# Patient Record
Sex: Female | Born: 1970
Health system: Southern US, Community
[De-identification: ages and names within clinical notes are randomized; demographics above are authoritative.]

## PROBLEM LIST (undated history)

## (undated) DIAGNOSIS — I10 Essential (primary) hypertension: Secondary | ICD-10-CM

## (undated) DIAGNOSIS — G4700 Insomnia, unspecified: Secondary | ICD-10-CM

## (undated) DIAGNOSIS — J40 Bronchitis, not specified as acute or chronic: Secondary | ICD-10-CM

## (undated) DIAGNOSIS — J45909 Unspecified asthma, uncomplicated: Secondary | ICD-10-CM

## (undated) DIAGNOSIS — J449 Chronic obstructive pulmonary disease, unspecified: Secondary | ICD-10-CM

## (undated) DIAGNOSIS — E559 Vitamin D deficiency, unspecified: Secondary | ICD-10-CM

## (undated) HISTORY — PX: TUBAL LIGATION: SHX77

## (undated) HISTORY — PX: TONSILLECTOMY: SUR1361

## (undated) HISTORY — DX: Insomnia, unspecified: G47.00

## (undated) HISTORY — DX: Chronic obstructive pulmonary disease, unspecified: J44.9

---
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
## 2000-08-16 ENCOUNTER — Encounter: Payer: Self-pay | Admitting: *Deleted

## 2000-08-16 ENCOUNTER — Emergency Department (HOSPITAL_COMMUNITY): Admission: EM | Admit: 2000-08-16 | Discharge: 2000-08-16 | Payer: Self-pay | Admitting: *Deleted

## 2000-10-13 ENCOUNTER — Emergency Department (HOSPITAL_COMMUNITY): Admission: EM | Admit: 2000-10-13 | Discharge: 2000-10-13 | Payer: Self-pay | Admitting: *Deleted

## 2001-06-16 ENCOUNTER — Emergency Department (HOSPITAL_COMMUNITY): Admission: EM | Admit: 2001-06-16 | Discharge: 2001-06-16 | Payer: Self-pay | Admitting: Internal Medicine

## 2001-06-17 ENCOUNTER — Emergency Department (HOSPITAL_COMMUNITY): Admission: EM | Admit: 2001-06-17 | Discharge: 2001-06-17 | Payer: Self-pay | Admitting: *Deleted

## 2002-01-15 ENCOUNTER — Emergency Department (HOSPITAL_COMMUNITY): Admission: EM | Admit: 2002-01-15 | Discharge: 2002-01-16 | Payer: Self-pay | Admitting: *Deleted

## 2002-01-19 ENCOUNTER — Emergency Department (HOSPITAL_COMMUNITY): Admission: EM | Admit: 2002-01-19 | Discharge: 2002-01-19 | Payer: Self-pay | Admitting: Emergency Medicine

## 2013-09-16 ENCOUNTER — Emergency Department: Payer: Self-pay | Admitting: Emergency Medicine

## 2013-09-16 LAB — COMPREHENSIVE METABOLIC PANEL
Albumin: 3.6 g/dL (ref 3.4–5.0)
Alkaline Phosphatase: 91 U/L
Anion Gap: 7 (ref 7–16)
BUN: 12 mg/dL (ref 7–18)
Bilirubin,Total: 0.4 mg/dL (ref 0.2–1.0)
Calcium, Total: 8.6 mg/dL (ref 8.5–10.1)
Chloride: 107 mmol/L (ref 98–107)
Co2: 28 mmol/L (ref 21–32)
Creatinine: 0.99 mg/dL (ref 0.60–1.30)
EGFR (African American): >60
EGFR (Non-African Amer.): >60
Glucose: 87 mg/dL (ref 65–99)
Osmolality: 282 (ref 275–301)
Potassium: 3.5 mmol/L (ref 3.5–5.1)
SGOT(AST): 21 U/L (ref 15–37)
SGPT (ALT): 19 U/L
Sodium: 142 mmol/L (ref 136–145)
Total Protein: 7.7 g/dL (ref 6.4–8.2)

## 2013-09-16 LAB — CBC
HCT: 43.9 % (ref 35.0–47.0)
HGB: 14.1 g/dL (ref 12.0–16.0)
MCH: 28.6 pg (ref 26.0–34.0)
MCHC: 32 g/dL (ref 32.0–36.0)
MCV: 89 fL (ref 80–100)
Platelet: 277 10*3/uL (ref 150–440)
RBC: 4.92 10*6/uL (ref 3.80–5.20)
RDW: 13.4 % (ref 11.5–14.5)
WBC: 3.6 10*3/uL (ref 3.6–11.0)

## 2013-09-16 LAB — TROPONIN I: Troponin-I: <0.02 ng/mL

## 2013-12-22 ENCOUNTER — Emergency Department: Payer: Self-pay | Admitting: Emergency Medicine

## 2014-07-07 ENCOUNTER — Emergency Department: Payer: Self-pay

## 2014-07-07 ENCOUNTER — Encounter: Payer: Self-pay | Admitting: Emergency Medicine

## 2014-07-07 ENCOUNTER — Emergency Department
Admission: EM | Admit: 2014-07-07 | Discharge: 2014-07-07 | Disposition: A | Discharge status: Home / Self Care | Payer: Self-pay | Attending: Emergency Medicine | Admitting: Emergency Medicine

## 2014-07-07 DIAGNOSIS — Z79899 Other long term (current) drug therapy: Secondary | ICD-10-CM | POA: Insufficient documentation

## 2014-07-07 DIAGNOSIS — S90212A Contusion of left great toe with damage to nail, initial encounter: Secondary | ICD-10-CM | POA: Insufficient documentation

## 2014-07-07 DIAGNOSIS — I10 Essential (primary) hypertension: Secondary | ICD-10-CM | POA: Insufficient documentation

## 2014-07-07 DIAGNOSIS — S99922A Unspecified injury of left foot, initial encounter: Secondary | ICD-10-CM

## 2014-07-07 DIAGNOSIS — W2209XA Striking against other stationary object, initial encounter: Secondary | ICD-10-CM | POA: Insufficient documentation

## 2014-07-07 DIAGNOSIS — S90122A Contusion of left lesser toe(s) without damage to nail, initial encounter: Secondary | ICD-10-CM

## 2014-07-07 DIAGNOSIS — Y9289 Other specified places as the place of occurrence of the external cause: Secondary | ICD-10-CM | POA: Insufficient documentation

## 2014-07-07 DIAGNOSIS — Y9389 Activity, other specified: Secondary | ICD-10-CM | POA: Insufficient documentation

## 2014-07-07 DIAGNOSIS — Y998 Other external cause status: Secondary | ICD-10-CM | POA: Insufficient documentation

## 2014-07-07 DIAGNOSIS — S91202A Unspecified open wound of left great toe with damage to nail, initial encounter: Secondary | ICD-10-CM | POA: Insufficient documentation

## 2014-07-07 DIAGNOSIS — S91209A Unspecified open wound of unspecified toe(s) with damage to nail, initial encounter: Secondary | ICD-10-CM

## 2014-07-07 HISTORY — DX: Essential (primary) hypertension: I10

## 2014-07-07 HISTORY — DX: Unspecified asthma, uncomplicated: J45.909

## 2014-07-07 MED ORDER — IBUPROFEN 800 MG PO TABS: 800.0000 mg | ORAL_TABLET | Freq: Three times a day (TID) | ORAL | Status: DC | PRN

## 2014-07-07 MED ORDER — TETANUS-DIPHTH-ACELL PERTUSSIS 5-2.5-18.5 LF-MCG/0.5 IM SUSP
0.5000 mL | Freq: Once | INTRAMUSCULAR | Status: AC
  Administered 2014-07-07: 0.5 mL via INTRAMUSCULAR

## 2014-07-07 MED ORDER — TRAMADOL HCL 50 MG PO TABS
ORAL_TABLET | ORAL | Status: AC
  Filled 2014-07-07: qty 1

## 2014-07-07 MED ORDER — TRAMADOL HCL 50 MG PO TABS: 50.0000 mg | ORAL_TABLET | Freq: Three times a day (TID) | ORAL | Status: DC | PRN

## 2014-07-07 MED ORDER — TETANUS-DIPHTH-ACELL PERTUSSIS 5-2.5-18.5 LF-MCG/0.5 IM SUSP
INTRAMUSCULAR | Status: AC
  Administered 2014-07-07: 0.5 mL via INTRAMUSCULAR
  Filled 2014-07-07: qty 0.5

## 2014-07-07 MED ORDER — TRAMADOL HCL 50 MG PO TABS
50.0000 mg | ORAL_TABLET | Freq: Once | ORAL | Status: AC
  Administered 2014-07-07: 50 mg via ORAL

## 2014-07-07 MED ORDER — CEPHALEXIN 500 MG PO CAPS: 500.0000 mg | ORAL_CAPSULE | Freq: Four times a day (QID) | ORAL | Status: AC

## 2014-07-07 NOTE — ED Provider Notes (Signed)
[facility] Emergency Department Provider Note  ____________________________________________  Time seen: Approximately 5:24 PM  I have reviewed the triage vital signs and the nursing notes.   HISTORY  Chief Complaint Laceration   HPI Catherine Kane is a 44 y.o. female presents to the ER for the complaints of left great toe pain. Patient states that prior to arrival she and spouse were moving a piece of furniture and neck furniture hit toe now causing toenail to be pulled up. Patient states pain since. Denies fall or other injury. Denies head injury or loss of consciousness.  States pain to be 7 out of 10 and throbbing. Denies pain radiation. States worse when touched.  Past Medical History  Diagnosis Date  . Asthma   . Hypertension     There are no active problems to display for this patient.   Past Surgical History  Procedure Laterality Date  . Tonsillectomy      Current Outpatient Rx  Name  Route  Sig  Dispense  Refill  . gemfibrozil (LOPID) 600 MG tablet   Oral   Take 600 mg by mouth 2 (two) times daily before a meal.         . lisinopril (PRINIVIL,ZESTRIL) 20 MG tablet   Oral   Take 20 mg by mouth daily.           Allergies Review of patient's allergies indicates no known allergies.  History reviewed. No pertinent family history.  Social History History  Substance Use Topics  . Smoking status: Never Smoker   . Smokeless tobacco: Not on file  . Alcohol Use: No    Review of Systems Constitutional: No fever/chills Eyes: No visual changes. ENT: No sore throat. Cardiovascular: Denies chest pain. Respiratory: Denies shortness of breath. Gastrointestinal: No abdominal pain.  No nausea, no vomiting.  No diarrhea.  No constipation. Genitourinary: Negative for dysuria. Musculoskeletal: Negative for back pain. Positive for left great toe and foot pain. Skin: Negative for rash. Neurological: Negative for headaches, focal  weakness or numbness.  10-point ROS otherwise negative.  ____________________________________________   PHYSICAL EXAM:  VITAL SIGNS: ED Triage Vitals  Enc Vitals Group     BP 07/07/14 1648 156/98 mmHg     Pulse Rate 07/07/14 1648 69     Resp 07/07/14 1648 21     Temp 07/07/14 1654 98.5 F (36.9 C)     Temp Source 07/07/14 1654 Oral     SpO2 07/07/14 1648 99 %     Weight --      Height --      Head Cir --      Peak Flow --      Pain Score 07/07/14 1641 8     Pain Loc --      Pain Edu? --      Excl. in GC? --     Constitutional: Alert and oriented. Well appearing and in no acute distress. Eyes: Conjunctivae are normal.  Head: Atraumatic. Nose: No congestion/rhinnorhea. Mouth/Throat: Mucous membranes are moist.  Neck: No stridor.   Hematological/Lymphatic/Immunilogical: No cervical lymphadenopathy. Cardiovascular: Normal rate, regular rhythm. Grossly normal heart sounds.  Good peripheral circulation. Respiratory: Normal respiratory effort.  No retractions. Lungs CTAB. Gastrointestinal: Soft and nontender.  Musculoskeletal: No lower extremity tenderness nor edema.  No joint effusions. Left foot tender along the first and second metatarsal and into first and second toe. Left great toe moderate tender to palpation with mild swelling. Left great toenail slightly avulsed upwards with right  lateral nail base pulled from bed, otherwise Nail base intact. No nailbed laceration. Sensation intact.  Neurologic:  Normal speech and language. No gross focal neurologic deficits are appreciated. Speech is normal. No gait instability. Skin:  Skin is warm, dry and intact. No rash noted. Psychiatric: Mood and affect are normal. Speech and behavior are normal.  _______________________________________  RADIOLOGY  LEFT FOOT - COMPLETE 3+ VIEW  COMPARISON: None  FINDINGS: Osseous mineralization normal.  Joint spaces preserved.  No fracture, dislocation, or bone  destruction.  IMPRESSION: No acute osseous abnormalities.   Electronically Signed By: Ulyses SouthwardMark Boles M.D. On: 07/07/2014 17:44 ____________________________________________   PROCEDURES  Procedure(s) performed:  Discussed procedure with patient. Patient verbalized understanding. Patient states does not want numbing/anesthesia at this time.Left foot soaked and cleaned with betadine and saline. Nail bed inspected. No nailbed laceration. Right lateral base of nail avulsed from bed. Nail again cleaned with saline and Betadine. Nail solid and intact. Corner of nail base reinserted into nailbed. Patient tolerated this well. Nail and surrounding tissue cleaned. Patient tolerated well.  ____________________________________________   INITIAL IMPRESSION / ASSESSMENT AND PLAN / ED COURSE  Pertinent labs & imaging results that were available during my care of the patient were reviewed by me and considered in my medical decision making (see chart for details).  Very well-appearing patient. At no acute distress. Presents to the ER with left great toe nail avulsion and pain after injury. X-ray negative for bony injury. Nail cleaned and repositioned into correct alignment. Recent tolerated well. Keep elevated and rest. Discussed strict follow-up and return parameters. Patient agreed to plan. ____________________________________________   FINAL CLINICAL IMPRESSION(S) / ED DIAGNOSES  Final diagnoses:  Injury of toenail, left great toe, initial encounter  Nail avulsion of left great toe, initial encounter  Contusion, left great toe, left, initial encounter      Renford DillsLindsey Jasyah Theurer, NP 07/07/14 1859  Governor Rooksebecca Lord, MD 07/07/14 2014

## 2014-07-07 NOTE — ED Notes (Signed)
Left great toe laceration.  Was moving furniture and hit great toe.  Toe nail peeled back.

## 2014-07-07 NOTE — ED Notes (Signed)
Capillary refill less than 3 seconds, pt tolerated procedure well, pain 3/10, and pulses present.

## 2014-07-07 NOTE — Discharge Instructions (Signed)
Keep area clean. Avoid reinjury. Take medication as prescribed.  Follow-up with her primary care physician or the above as needed this week  Return to the ER for new or worsening concerns.  Nail Avulsion Injury Nail avulsion means that you have lost the whole, or part of a nail. The nail will usually grow back in 2 to 6 months. If your injury damaged the growth center of the nail, the nail may be deformed, split, or not stuck to the nail bed. Sometimes the avulsed nail is stitched back in place. This provides temporary protection to the nail bed until the new nail grows in.  HOME CARE INSTRUCTIONS   Raise (elevate) your injury as much as possible.  Protect the injury and cover it with bandages (dressings) or splints as instructed.  Change dressings as instructed. SEEK MEDICAL CARE IF:   There is increasing pain, redness, or swelling.  You cannot move your fingers or toes. Document Released: 03/02/2004 Document Revised: 04/17/2011 Document Reviewed: 12/25/2008 [facility] Patient Information 2015 [place], [place]. This information is not intended to replace advice given to you by your health care provider. Make sure you discuss any questions you have with your health care provider.  Contusion A contusion is a deep bruise. Contusions happen when an injury causes bleeding under the skin. Signs of bruising include pain, puffiness (swelling), and discolored skin. The contusion may turn blue, purple, or yellow. HOME CARE   Put ice on the injured area.  Put ice in a plastic bag.  Place a towel between your skin and the bag.  Leave the ice on for 15-20 minutes, 03-04 times a day.  Only take medicine as told by your doctor.  Rest the injured area.  If possible, raise (elevate) the injured area to lessen puffiness. GET HELP RIGHT AWAY IF:   You have more bruising or puffiness.  You have pain that is getting worse.  Your puffiness or pain is not helped by medicine. MAKE SURE YOU:    Understand these instructions.  Will watch your condition.  Will get help right away if you are not doing well or get worse. Document Released: 07/12/2007 Document Revised: 04/17/2011 Document Reviewed: 11/28/2010 [facility] Patient Information 2015 [place], [place]. This information is not intended to replace advice given to you by your health care provider. Make sure you discuss any questions you have with your health care provider.

## 2016-01-10 ENCOUNTER — Emergency Department (HOSPITAL_COMMUNITY): Payer: Self-pay

## 2016-01-10 ENCOUNTER — Emergency Department (HOSPITAL_COMMUNITY)
Admission: EM | Admit: 2016-01-10 | Discharge: 2016-01-10 | Disposition: A | Discharge status: Home / Self Care | Payer: Self-pay | Attending: Emergency Medicine | Admitting: Emergency Medicine

## 2016-01-10 ENCOUNTER — Encounter (HOSPITAL_COMMUNITY): Payer: Self-pay | Admitting: Nurse Practitioner

## 2016-01-10 DIAGNOSIS — R0602 Shortness of breath: Secondary | ICD-10-CM | POA: Insufficient documentation

## 2016-01-10 DIAGNOSIS — J45909 Unspecified asthma, uncomplicated: Secondary | ICD-10-CM | POA: Insufficient documentation

## 2016-01-10 DIAGNOSIS — R002 Palpitations: Secondary | ICD-10-CM | POA: Insufficient documentation

## 2016-01-10 DIAGNOSIS — Z79899 Other long term (current) drug therapy: Secondary | ICD-10-CM | POA: Insufficient documentation

## 2016-01-10 DIAGNOSIS — R109 Unspecified abdominal pain: Secondary | ICD-10-CM | POA: Insufficient documentation

## 2016-01-10 DIAGNOSIS — I10 Essential (primary) hypertension: Secondary | ICD-10-CM | POA: Insufficient documentation

## 2016-01-10 HISTORY — DX: Bronchitis, not specified as acute or chronic: J40

## 2016-01-10 LAB — CBC
HEMATOCRIT: 43.7 % (ref 36.0–46.0)
Hemoglobin: 14.9 g/dL (ref 12.0–15.0)
MCH: 29.7 pg (ref 26.0–34.0)
MCHC: 34.1 g/dL (ref 30.0–36.0)
MCV: 87.2 fL (ref 78.0–100.0)
PLATELETS: 285 10*3/uL (ref 150–400)
RBC: 5.01 MIL/uL (ref 3.87–5.11)
RDW: 13.2 % (ref 11.5–15.5)
WBC: 5.3 10*3/uL (ref 4.0–10.5)

## 2016-01-10 LAB — BASIC METABOLIC PANEL
Anion gap: 8 (ref 5–15)
BUN: 9 mg/dL (ref 6–20)
CALCIUM: 9.4 mg/dL (ref 8.9–10.3)
CO2: 24 mmol/L (ref 22–32)
Chloride: 106 mmol/L (ref 101–111)
Creatinine, Ser: 0.79 mg/dL (ref 0.44–1.00)
GFR calc Af Amer: >60 mL/min (ref >60)
Glucose, Bld: 90 mg/dL (ref 65–99)
POTASSIUM: 3.6 mmol/L (ref 3.5–5.1)
SODIUM: 138 mmol/L (ref 135–145)

## 2016-01-10 LAB — MAGNESIUM: Magnesium: 1.9 mg/dL (ref 1.7–2.4)

## 2016-01-10 LAB — I-STAT BETA HCG BLOOD, ED (MC, WL, AP ONLY): I-stat hCG, quantitative: <5 m[IU]/mL (ref <5)

## 2016-01-10 LAB — URINALYSIS, ROUTINE W REFLEX MICROSCOPIC
Bilirubin Urine: NEGATIVE
Glucose, UA: NEGATIVE mg/dL
Hgb urine dipstick: NEGATIVE
KETONES UR: NEGATIVE mg/dL
Leukocytes, UA: NEGATIVE
NITRITE: NEGATIVE
PH: 7 (ref 5.0–8.0)
PROTEIN: NEGATIVE mg/dL
Specific Gravity, Urine: 1.024 (ref 1.005–1.030)

## 2016-01-10 LAB — I-STAT TROPONIN, ED: Troponin i, poc: 0 ng/mL (ref 0.00–0.08)

## 2016-01-10 LAB — BRAIN NATRIURETIC PEPTIDE: B Natriuretic Peptide: 19.3 pg/mL (ref 0.0–100.0)

## 2016-01-10 LAB — TSH: TSH: 0.54 u[IU]/mL (ref 0.350–4.500)

## 2016-01-10 MED ORDER — KETOROLAC TROMETHAMINE 60 MG/2ML IM SOLN
60.0000 mg | Freq: Once | INTRAMUSCULAR | Status: AC
  Administered 2016-01-10: 60 mg via INTRAMUSCULAR
  Filled 2016-01-10: qty 2

## 2016-01-10 MED ORDER — NAPROXEN 500 MG PO TABS: 500.0000 mg | ORAL_TABLET | Freq: Two times a day (BID) | ORAL | 0 refills | Status: DC

## 2016-01-10 NOTE — ED Triage Notes (Signed)
Pt endorses chest fluttering and palpitations intermittently for 1-2 months but in the past week she has had these episodes every day. Patient states episodes last less than a minute and typically when she is laying down, but has happened when she was walking. Patient states left shoulder tightness during episode.Patient also notes she coughs after spells.   Patient also complaining of left sided flank pain that radiates to groin one week ago. Patient states has mild relief with ibuprofen. Denies injury, heavy lifting or twisting. Patient states has dysuria and polyuria started three days ago. Pt does take lasix.   Pt in NAD, ambulatory

## 2016-01-10 NOTE — ED Notes (Signed)
Patient transported to CT 

## 2016-01-10 NOTE — ED Notes (Signed)
Pt ambulatory at discharge. Pt verbalized understanding of discharge teaching. NAD. VSS.

## 2016-01-10 NOTE — ED Notes (Signed)
Patient transported to X-ray 

## 2016-01-10 NOTE — Discharge Instructions (Signed)
Please follow up with your primary care physician for possible Holter monitoring.  Start taking Naprosyn twice daily for your left flank pain.  Return to the ED for sudden worsening pain, fever, chills, chest pain, shortness of breath, or any new or concerning symptoms.

## 2016-01-10 NOTE — ED Provider Notes (Signed)
[facility]-EMERGENCY DEPT Provider Note   CSN: [id number] Arrival date & time: 01/10/16  1137     History   Chief Complaint Chief Complaint  Patient presents with  . Flank Pain  . Palpitations    HPI Catherine Kane is a 45 y.o. female.  HPI Catherine Kane is a 44 y.o. female with PMH significant for HTN, asthma, and bronchitis who presents with 2 complaints.  1. Palpitations.  Patient reports feeling like her heart is fluttering over the last 1-2 months, but it has increased in frequency over the last week and has occurred every day.  She states it lasts less than a minute.  Associated symptoms include left sided chest discomfort and SOB.  It comes with exertion and at rest, no specific trigger.  No syncope, fever, chills, confusion, diaphoresis, dizziness, lightheadedness or presyncope.  No cardiac history.  No lower extremity swelling.  She attributes her SOB due to her weight.  She states she has been under a lot of stress recently and wonders if this is anxiety.   2. Left flank pain.  Patient reports sudden onset, waxing and waning, throbbing left flank pain that began about a week ago.  Associated symptoms include dysuria and increased urinary frequency.  No injury/trauma.  Nothing makes it worse.  Ibuprofen seems to help.  She reports a history of left sided sciatica, but states this does not feel similar.  No numbness, weakness, b/b incontinence, urinary retention, hematuria.    Past Medical History:  Diagnosis Date  . Asthma   . Bronchitis   . Hypertension     There are no active problems to display for this patient.   Past Surgical History:  Procedure Laterality Date  . TONSILLECTOMY      OB History    No data available       Home Medications    Prior to Admission medications   Medication Sig Start Date End Date Taking? Authorizing Provider  amLODipine (NORVASC) 10 MG tablet Take 10 mg by mouth daily.   Yes Historical Provider, MD    hydrochlorothiazide (MICROZIDE) 12.5 MG capsule Take 12.5 mg by mouth daily.   Yes Historical Provider, MD  ibuprofen (ADVIL,MOTRIN) 800 MG tablet Take 1 tablet (800 mg total) by mouth every 8 (eight) hours as needed for mild pain or moderate pain. 07/07/14  Yes Renford Dills, NP  lisinopril (PRINIVIL,ZESTRIL) 20 MG tablet Take 20 mg by mouth daily.   Yes Historical Provider, MD  gemfibrozil (LOPID) 600 MG tablet Take 600 mg by mouth 2 (two) times daily before a meal.    Historical Provider, MD  naproxen (NAPROSYN) 500 MG tablet Take 1 tablet (500 mg total) by mouth 2 (two) times daily. 01/10/16   Cheri Fowler, PA-C  traMADol (ULTRAM) 50 MG tablet Take 1 tablet (50 mg total) by mouth every 8 (eight) hours as needed (Do not drive or operate machinery while taking as can cause drowsiness.). Patient not taking: Reported on 01/10/2016 07/07/14   Renford Dills, NP    Family History No family history on file.  Social History Social History  Substance Use Topics  . Smoking status: Never Smoker  . Smokeless tobacco: Never Used  . Alcohol use No     Allergies   Patient has no known allergies.   Review of Systems Review of Systems All other systems negative unless otherwise stated in HPI   Physical Exam Updated Vital Signs BP 147/91   Pulse 75   Temp 98 F (  36.7 C) (Oral)   Resp 16   Ht 5\' 3"  (1.6 m)   Wt 92.8 kg   LMP 12/27/2015   SpO2 100%   BMI 36.24 kg/m   Physical Exam  Constitutional: She is oriented to person, place, and time. She appears well-developed and well-nourished.  Non-toxic appearance. She does not have a sickly appearance. She does not appear ill.  HENT:  Head: Normocephalic and atraumatic.  Mouth/Throat: Oropharynx is clear and moist.  Eyes: Conjunctivae are normal.  Neck: Normal range of motion. Neck supple.  Cardiovascular: Normal rate and regular rhythm.   Pulmonary/Chest: Effort normal and breath sounds normal. No accessory muscle usage or stridor. No  respiratory distress. She has no wheezes. She has no rhonchi. She has no rales.  Abdominal: Soft. Bowel sounds are normal. She exhibits no distension. There is no tenderness. There is no rebound and no guarding.  No CVA tenderness.  Musculoskeletal: Normal range of motion. She exhibits tenderness.  No t/l midline tenderness. Mild tenderness over left lumbar and flank musculature.   Lymphadenopathy:    She has no cervical adenopathy.  Neurological: She is alert and oriented to person, place, and time.  Speech clear without dysarthria.  Skin: Skin is warm and dry.  Psychiatric: She has a normal mood and affect. Her behavior is normal.     ED Treatments / Results  Labs (all labs ordered are listed, but only abnormal results are displayed) Labs Reviewed  BASIC METABOLIC PANEL  CBC  URINALYSIS, ROUTINE W REFLEX MICROSCOPIC (NOT AT [facility])  TSH  MAGNESIUM  BRAIN NATRIURETIC PEPTIDE  I-STAT BETA HCG BLOOD, ED ([facility], [facility], [facility] ONLY)  I-STAT TROPOININ, ED    EKG  EKG Interpretation  Date/Time:  Monday January 10 2016 15:23:18 EST Ventricular Rate:  70 PR Interval:    QRS Duration: 105 QT Interval:  386 QTC Calculation: 417 R Axis:   33 Text Interpretation:  Sinus rhythm Inferior infarct, old Confirmed by GOLDSTON MD, SCOTT [phone]([id number]) on 01/10/2016 4:30:40 PM       Radiology Dg Chest 2 View  Result Date: 01/10/2016 CLINICAL DATA:  Chest pain and shortness of Breath EXAM: CHEST  2 VIEW COMPARISON:  09/16/2013 FINDINGS: The heart size and mediastinal contours are within normal limits. Both lungs are clear. The visualized skeletal structures are unremarkable. IMPRESSION: No active cardiopulmonary disease. Electronically Signed   By: Alcide CleverMark  Lukens M.D.   On: 01/10/2016 15:42   Ct Renal Stone Study  Result Date: 01/10/2016 CLINICAL DATA:  45 y/o  F; left flank pain. EXAM: CT ABDOMEN AND PELVIS WITHOUT CONTRAST TECHNIQUE: Multidetector CT imaging of the abdomen and pelvis was performed  following the standard protocol without IV contrast. COMPARISON:  None. FINDINGS: Lower chest: No acute abnormality. Hepatobiliary: Hepatic steatosis. No focal liver abnormality. Normal gallbladder. No intra or extrahepatic biliary ductal dilatation. Pancreas: Unremarkable. No pancreatic ductal dilatation or surrounding inflammatory changes. Spleen: Normal in size without focal abnormality. Adrenals/Urinary Tract: Adrenal glands are unremarkable. Kidneys are normal, without renal calculi, focal lesion, or hydronephrosis. Bladder is unremarkable. Stomach/Bowel: Stomach is within normal limits. Appendix appears normal. No evidence of bowel wall thickening, distention, or inflammatory changes. Vascular/Lymphatic: No significant vascular findings are present. No enlarged abdominal or pelvic lymph nodes. Reproductive: Uterus and bilateral adnexa are unremarkable. Other: Several scattered densities within the lower abdominal wall subcutaneous fat may represent injection sites or sequelae of trauma. Musculoskeletal: No acute or significant osseous findings. IMPRESSION: 1. No acute process identified. No urinary stone disease  or obstructive uropathy. 2. Hepatic steatosis. Electronically Signed   By: Mitzi HansenLance  Furusawa-Stratton M.D.   On: 01/10/2016 16:21    Procedures Procedures (including critical care time)  Medications Ordered in ED Medications  ketorolac (TORADOL) injection 60 mg (60 mg Intramuscular Given 01/10/16 1637)     Initial Impression / Assessment and Plan / ED Course  I have reviewed the triage vital signs and the nursing notes.  Pertinent labs & imaging results that were available during my care of the patient were reviewed by me and considered in my medical decision making (see chart for details).  Clinical Course    Patient presents with palpitations without red flags (cardiac disease, abnormal EKG, syncope/pre-syncope, exertional, CHF, or hypotension).  Labs including Mg and TSH without  acute abnormalities.  EKG without arrhythmias.  Discussed follow up with PCP for possible Holter monitoring.  Regarding flank pain, CT renal stone study (obtained due to acute colicky nature) without acute abnormalities, UA without infection.  Abdomen is soft and benign, low suspicion for acute abdominal or gynecologic etiology.  Possible musculoskeletal.  Plan to discharge home with Naproxen.  Return precautions discussed.  All questions answered.  Stable for discharge   Case has been discussed with Dr. Criss AlvineGoldston who agrees with the above plan for discharge.   Final Clinical Impressions(s) / ED Diagnoses   Final diagnoses:  Palpitations  Left flank pain    New Prescriptions New Prescriptions   NAPROXEN (NAPROSYN) 500 MG TABLET    Take 1 tablet (500 mg total) by mouth 2 (two) times daily.     Cheri FowlerKayla Jamesen Stahnke, PA-C 01/10/16 1649    Pricilla LovelessScott Goldston, MD 01/11/16 [phone]

## 2016-08-11 ENCOUNTER — Emergency Department (HOSPITAL_COMMUNITY): Payer: Self-pay

## 2016-08-11 ENCOUNTER — Emergency Department (HOSPITAL_COMMUNITY)
Admission: EM | Admit: 2016-08-11 | Discharge: 2016-08-11 | Disposition: A | Discharge status: Home / Self Care | Payer: Self-pay | Attending: Emergency Medicine | Admitting: Emergency Medicine

## 2016-08-11 ENCOUNTER — Encounter (HOSPITAL_COMMUNITY): Payer: Self-pay

## 2016-08-11 DIAGNOSIS — Z79899 Other long term (current) drug therapy: Secondary | ICD-10-CM | POA: Insufficient documentation

## 2016-08-11 DIAGNOSIS — I10 Essential (primary) hypertension: Secondary | ICD-10-CM | POA: Insufficient documentation

## 2016-08-11 DIAGNOSIS — J45909 Unspecified asthma, uncomplicated: Secondary | ICD-10-CM | POA: Insufficient documentation

## 2016-08-11 LAB — I-STAT BETA HCG BLOOD, ED (MC, WL, AP ONLY)

## 2016-08-11 LAB — BASIC METABOLIC PANEL
Anion gap: 8 (ref 5–15)
BUN: 11 mg/dL (ref 6–20)
CALCIUM: 9.2 mg/dL (ref 8.9–10.3)
CO2: 24 mmol/L (ref 22–32)
Chloride: 103 mmol/L (ref 101–111)
Creatinine, Ser: 0.72 mg/dL (ref 0.44–1.00)
GFR calc Af Amer: >60 mL/min (ref >60)
GLUCOSE: 94 mg/dL (ref 65–99)
Potassium: 3.1 mmol/L — ABNORMAL LOW (ref 3.5–5.1)
Sodium: 135 mmol/L (ref 135–145)

## 2016-08-11 LAB — URINALYSIS, ROUTINE W REFLEX MICROSCOPIC
Bilirubin Urine: NEGATIVE
Glucose, UA: NEGATIVE mg/dL
Ketones, ur: NEGATIVE mg/dL
Leukocytes, UA: NEGATIVE
NITRITE: NEGATIVE
Protein, ur: NEGATIVE mg/dL
SPECIFIC GRAVITY, URINE: 1.017 (ref 1.005–1.030)
pH: 6 (ref 5.0–8.0)

## 2016-08-11 LAB — CBC
HEMATOCRIT: 43.1 % (ref 36.0–46.0)
Hemoglobin: 14.5 g/dL (ref 12.0–15.0)
MCH: 29 pg (ref 26.0–34.0)
MCHC: 33.6 g/dL (ref 30.0–36.0)
MCV: 86.2 fL (ref 78.0–100.0)
Platelets: 281 10*3/uL (ref 150–400)
RBC: 5 MIL/uL (ref 3.87–5.11)
RDW: 13 % (ref 11.5–15.5)
WBC: 5.4 10*3/uL (ref 4.0–10.5)

## 2016-08-11 LAB — I-STAT TROPONIN, ED: TROPONIN I, POC: 0 ng/mL (ref 0.00–0.08)

## 2016-08-11 MED ORDER — HYDROCHLOROTHIAZIDE 12.5 MG PO CAPS: 12.5000 mg | ORAL_CAPSULE | Freq: Every day | ORAL | 0 refills | Status: DC

## 2016-08-11 MED ORDER — OXYCODONE-ACETAMINOPHEN 5-325 MG PO TABS
1.0000 | ORAL_TABLET | Freq: Once | ORAL | Status: AC
  Administered 2016-08-11: 1 via ORAL
  Filled 2016-08-11: qty 1

## 2016-08-11 MED ORDER — HYDROCHLOROTHIAZIDE 12.5 MG PO CAPS
12.5000 mg | ORAL_CAPSULE | Freq: Every day | ORAL | Status: DC
  Administered 2016-08-11: 12.5 mg via ORAL
  Filled 2016-08-11: qty 1

## 2016-08-11 MED ORDER — TRAMADOL HCL 50 MG PO TABS: 50.0000 mg | ORAL_TABLET | Freq: Four times a day (QID) | ORAL | 0 refills | Status: DC | PRN

## 2016-08-11 NOTE — ED Triage Notes (Signed)
Per Pt, Pt is coming from home with a constant posterior headache that is radiating down the neck for the past week. Pt reports hx of HTN and is aware of increased BP, but no relief with medication. Reports SOB and some chest pain. Pt has had some nausea along with some lightheadedness.

## 2016-08-11 NOTE — Discharge Instructions (Signed)
Follow-up with her doctor the next 1-2 weeks. Start back taking your diuretic

## 2016-08-11 NOTE — ED Provider Notes (Signed)
[facility]Y DEPT Provider Note   CSN: [id number] Arrival date & time: 08/11/16  1715     History   Chief Complaint Chief Complaint  Patient presents with  . Headache  . Hypertension    HPI Catherine Kane is a 46 y.o. female.  Patient complains of a headache. Patient states she has not been taking her hydrochlorothiazide    Headache   This is a new problem. The current episode started more than 2 days ago. The problem occurs constantly. The problem has not changed since onset.The headache is associated with nothing. The pain is located in the right unilateral region. The quality of the pain is described as dull. The pain is at a severity of 6/10. The pain is moderate. The pain does not radiate.    Past Medical History:  Diagnosis Date  . Asthma   . Bronchitis   . Hypertension     There are no active problems to display for this patient.   Past Surgical History:  Procedure Laterality Date  . TONSILLECTOMY      OB History    No data available       Home Medications    Prior to Admission medications   Medication Sig Start Date End Date Taking? Authorizing Provider  amLODipine (NORVASC) 10 MG tablet Take 10 mg by mouth daily.    [provider]  gemfibrozil (LOPID) 600 MG tablet Take 600 mg by mouth 2 (two) times daily before a meal.    [provider]  hydrochlorothiazide (MICROZIDE) 12.5 MG capsule Take 1 capsule (12.5 mg total) by mouth daily. 08/11/16   Bethann Berkshire, MD  ibuprofen (ADVIL,MOTRIN) 800 MG tablet Take 1 tablet (800 mg total) by mouth every 8 (eight) hours as needed for mild pain or moderate pain. 07/07/14   Renford Dills, NP  lisinopril (PRINIVIL,ZESTRIL) 20 MG tablet Take 20 mg by mouth daily.    [provider]  naproxen (NAPROSYN) 500 MG tablet Take 1 tablet (500 mg total) by mouth 2 (two) times daily. 01/10/16   Cheri Fowler, PA-C  traMADol (ULTRAM) 50 MG tablet Take 1 tablet (50 mg total) by mouth every 8  (eight) hours as needed (Do not drive or operate machinery while taking as can cause drowsiness.). Patient not taking: Reported on 01/10/2016 07/07/14   Renford Dills, NP    Family History No family history on file.  Social History Social History  Substance Use Topics  . Smoking status: Never Smoker  . Smokeless tobacco: Never Used  . Alcohol use No     Allergies   Patient has no known allergies.   Review of Systems Review of Systems  Constitutional: Negative for appetite change and fatigue.  HENT: Negative for congestion, ear discharge and sinus pressure.   Eyes: Negative for discharge.  Respiratory: Negative for cough.   Cardiovascular: Negative for chest pain.  Gastrointestinal: Negative for abdominal pain and diarrhea.  Genitourinary: Negative for frequency and hematuria.  Musculoskeletal: Negative for back pain.  Skin: Negative for rash.  Neurological: Positive for headaches. Negative for seizures.  Psychiatric/Behavioral: Negative for hallucinations.     Physical Exam Updated Vital Signs BP (!) 155/87   Pulse 62   Temp 98.3 F (36.8 C) (Oral)   Resp 18   Ht 5\' 2"  (1.575 m)   Wt 88.5 kg (195 lb)   LMP 07/28/2016   SpO2 99%   BMI 35.67 kg/m   Physical Exam  Constitutional: She is oriented to person, place, and  time. She appears well-developed.  HENT:  Head: Normocephalic.  Eyes: Conjunctivae and EOM are normal. No scleral icterus.  Neck: Neck supple. No thyromegaly present.  Cardiovascular: Normal rate and regular rhythm.  Exam reveals no gallop and no friction rub.   No murmur heard. Pulmonary/Chest: No stridor. She has no wheezes. She has no rales. She exhibits no tenderness.  Abdominal: She exhibits no distension. There is no tenderness. There is no rebound.  Musculoskeletal: Normal range of motion. She exhibits no edema.  Lymphadenopathy:    She has no cervical adenopathy.  Neurological: She is oriented to person, place, and time. She exhibits  normal muscle tone. Coordination normal.  Skin: No rash noted. No erythema.  Psychiatric: She has a normal mood and affect. Her behavior is normal.     ED Treatments / Results  Labs (all labs ordered are listed, but only abnormal results are displayed) Labs Reviewed  BASIC METABOLIC PANEL - Abnormal; Notable for the following:       Result Value   Potassium 3.1 (*)    All other components within normal limits  URINALYSIS, ROUTINE W REFLEX MICROSCOPIC - Abnormal; Notable for the following:    Hgb urine dipstick SMALL (*)    Bacteria, UA RARE (*)    Squamous Epithelial / LPF 0-5 (*)    All other components within normal limits  CBC  I-STAT BETA HCG BLOOD, ED ([facility], [facility], [facility] ONLY)  I-STAT TROPOININ, ED    EKG  EKG Interpretation None       Radiology Dg Chest 2 View  Result Date: 08/11/2016 CLINICAL DATA:  Dyspnea and chest pain.  Hypertension. EXAM: CHEST  2 VIEW COMPARISON:  01/10/2016 CXR FINDINGS: The heart size and mediastinal contours are within normal limits. Both lungs are clear. The visualized skeletal structures are unremarkable. IMPRESSION: No active cardiopulmonary disease. Electronically Signed   By: Tollie Ethavid  Kwon M.D.   On: 08/11/2016 18:40    Procedures Procedures (including critical care time)  Medications Ordered in ED Medications  hydrochlorothiazide (MICROZIDE) capsule 12.5 mg (12.5 mg Oral Given 08/11/16 1935)  oxyCODONE-acetaminophen (PERCOCET/ROXICET) 5-325 MG per tablet 1 tablet (1 tablet Oral Given 08/11/16 1935)     Initial Impression / Assessment and Plan / ED Course  I have reviewed the triage vital signs and the nursing notes.  Pertinent labs & imaging results that were available during my care of the patient were reviewed by me and considered in my medical decision making (see chart for details).     Patient's headache and blood pressure improved with treatment in emergency department. She is given another prescription for her hydrochlorothiazide.  She will follow-up with her family doctor  Final Clinical Impressions(s) / ED Diagnoses   Final diagnoses:  Essential hypertension    New Prescriptions New Prescriptions   HYDROCHLOROTHIAZIDE (MICROZIDE) 12.5 MG CAPSULE    Take 1 capsule (12.5 mg total) by mouth daily.     Bethann BerkshireZammit, Latonyia Lopata, MD 08/11/16 2119

## 2016-08-26 ENCOUNTER — Encounter (HOSPITAL_COMMUNITY): Payer: Self-pay | Admitting: Emergency Medicine

## 2016-08-26 ENCOUNTER — Emergency Department (HOSPITAL_COMMUNITY): Payer: Self-pay

## 2016-08-26 ENCOUNTER — Emergency Department (HOSPITAL_COMMUNITY)
Admission: EM | Admit: 2016-08-26 | Discharge: 2016-08-26 | Disposition: A | Discharge status: Home / Self Care | Payer: Self-pay | Attending: Emergency Medicine | Admitting: Emergency Medicine

## 2016-08-26 DIAGNOSIS — Z791 Long term (current) use of non-steroidal anti-inflammatories (NSAID): Secondary | ICD-10-CM | POA: Insufficient documentation

## 2016-08-26 DIAGNOSIS — R0789 Other chest pain: Secondary | ICD-10-CM | POA: Insufficient documentation

## 2016-08-26 DIAGNOSIS — I10 Essential (primary) hypertension: Secondary | ICD-10-CM | POA: Insufficient documentation

## 2016-08-26 DIAGNOSIS — Z79899 Other long term (current) drug therapy: Secondary | ICD-10-CM | POA: Insufficient documentation

## 2016-08-26 DIAGNOSIS — J45909 Unspecified asthma, uncomplicated: Secondary | ICD-10-CM | POA: Insufficient documentation

## 2016-08-26 LAB — BASIC METABOLIC PANEL
ANION GAP: 11 (ref 5–15)
BUN: 11 mg/dL (ref 6–20)
CO2: 23 mmol/L (ref 22–32)
Calcium: 9.3 mg/dL (ref 8.9–10.3)
Chloride: 106 mmol/L (ref 101–111)
Creatinine, Ser: 0.81 mg/dL (ref 0.44–1.00)
GFR calc Af Amer: >60 mL/min (ref >60)
Glucose, Bld: 117 mg/dL — ABNORMAL HIGH (ref 65–99)
POTASSIUM: 3 mmol/L — AB (ref 3.5–5.1)
SODIUM: 140 mmol/L (ref 135–145)

## 2016-08-26 LAB — I-STAT TROPONIN, ED: Troponin i, poc: 0.01 ng/mL (ref 0.00–0.08)

## 2016-08-26 LAB — CBC
HEMATOCRIT: 42.9 % (ref 36.0–46.0)
HEMOGLOBIN: 14.5 g/dL (ref 12.0–15.0)
MCH: 28.9 pg (ref 26.0–34.0)
MCHC: 33.8 g/dL (ref 30.0–36.0)
MCV: 85.5 fL (ref 78.0–100.0)
Platelets: 319 10*3/uL (ref 150–400)
RBC: 5.02 MIL/uL (ref 3.87–5.11)
RDW: 12.8 % (ref 11.5–15.5)
WBC: 5.3 10*3/uL (ref 4.0–10.5)

## 2016-08-26 MED ORDER — POTASSIUM CHLORIDE CRYS ER 20 MEQ PO TBCR
40.0000 meq | EXTENDED_RELEASE_TABLET | Freq: Once | ORAL | Status: AC
  Administered 2016-08-26: 40 meq via ORAL
  Filled 2016-08-26: qty 2

## 2016-08-26 NOTE — ED Notes (Signed)
Pt believes CP may be related to stress ie her dog's death today.   To XRAY at this time.

## 2016-08-26 NOTE — ED Notes (Signed)
Patient left at this time with all belongings. Pt refused sling.

## 2016-08-26 NOTE — ED Triage Notes (Signed)
Pt brought to ED by [facility] from home for c/o 3/10 left side cp going to her left arm and back, pt states is like a fluttering on her heart, HR 105 on EMS arrival, pt took 500 mg ASA pt EMS arrival to her house, pt states she feels sob on this episodes, denies any nausea or vomiting. VS for EMS 176/107, HR 91, R-16, SPO296% RA.

## 2016-08-26 NOTE — ED Provider Notes (Signed)
[facility] Provider Note   CSN: [id number] Arrival date & time: 08/26/16  0410     History   Chief Complaint Chief Complaint  Patient presents with  . Chest Pain    HPI Catherine Kane is a 46 y.o. female.  46 yo  F with a chief complaint of chest pain. This is left-sided sharp last for seconds at a time. Worse with movement twisting. Denies exertional symptoms. Denies lower extremity edema. Denies recent trauma. Denies smoking. History of hypertension. Denies hyperlipidemia diabetes. Significant family history with multiple members having MIs in their 34s.  Patient describes his pain as a fluttering in her chest, comes and goes. Denies hemoptysis. Denies lower extremity edema. Denies recent travel. Denies prior PE or DVT.   The history is provided by the patient.  Chest Pain   This is a new problem. The current episode started 6 to 12 hours ago. The problem occurs constantly. The problem has been gradually worsening. The pain is associated with movement and raising an arm. The pain is present in the lateral region. The pain is at a severity of 9/10. The pain is severe. The quality of the pain is described as brief and sharp. The pain does not radiate. Duration of episode(s) is 2 seconds. The symptoms are aggravated by certain positions. Pertinent negatives include no dizziness, no fever, no headaches, no nausea, no palpitations, no shortness of breath and no vomiting. She has tried nothing for the symptoms. The treatment provided no relief.  Her past medical history is significant for hypertension.  Her family medical history is significant for early MI.    Past Medical History:  Diagnosis Date  . Asthma   . Bronchitis   . Hypertension     There are no active problems to display for this patient.   Past Surgical History:  Procedure Laterality Date  . TONSILLECTOMY      OB History    No data available       Home Medications    Prior to Admission  medications   Medication Sig Start Date End Date Taking? Authorizing Provider  amLODipine (NORVASC) 10 MG tablet Take 10 mg by mouth daily.    [provider]  gemfibrozil (LOPID) 600 MG tablet Take 600 mg by mouth 2 (two) times daily before a meal.    [provider]  hydrochlorothiazide (MICROZIDE) 12.5 MG capsule Take 1 capsule (12.5 mg total) by mouth daily. 08/11/16   Bethann Berkshire, MD  ibuprofen (ADVIL,MOTRIN) 800 MG tablet Take 1 tablet (800 mg total) by mouth every 8 (eight) hours as needed for mild pain or moderate pain. 07/07/14   Renford Dills, NP  lisinopril (PRINIVIL,ZESTRIL) 20 MG tablet Take 20 mg by mouth daily.    [provider]  naproxen (NAPROSYN) 500 MG tablet Take 1 tablet (500 mg total) by mouth 2 (two) times daily. 01/10/16   Cheri Fowler, PA-C  traMADol (ULTRAM) 50 MG tablet Take 1 tablet (50 mg total) by mouth every 6 (six) hours as needed. 08/11/16   Bethann Berkshire, MD    Family History No family history on file.  Social History Social History  Substance Use Topics  . Smoking status: Never Smoker  . Smokeless tobacco: Never Used  . Alcohol use No     Allergies   Patient has no known allergies.   Review of Systems Review of Systems  Constitutional: Negative for chills and fever.  HENT: Negative for congestion and rhinorrhea.   Eyes: Negative  for redness and visual disturbance.  Respiratory: Negative for shortness of breath and wheezing.   Cardiovascular: Positive for chest pain. Negative for palpitations.  Gastrointestinal: Negative for nausea and vomiting.  Genitourinary: Negative for dysuria and urgency.  Musculoskeletal: Negative for arthralgias and myalgias.  Skin: Negative for pallor and wound.  Neurological: Negative for dizziness and headaches.     Physical Exam Updated Vital Signs BP (!) 150/109 (BP Location: Right Arm)   Pulse 85   Temp 98.7 F (37.1 C) (Oral)   Resp 16   Ht 5\' 2"  (1.575 m)   Wt 88.5 kg (195  lb)   LMP 08/16/2016   SpO2 99%   BMI 35.67 kg/m   Physical Exam  Constitutional: She is oriented to person, place, and time. She appears well-developed and well-nourished. No distress.  HENT:  Head: Normocephalic and atraumatic.  Eyes: Pupils are equal, round, and reactive to light. EOM are normal.  Neck: Normal range of motion. Neck supple.  Cardiovascular: Normal rate and regular rhythm.  Exam reveals no gallop and no friction rub.   No murmur heard. Pulmonary/Chest: Effort normal. She has no wheezes. She has no rales. She exhibits tenderness (which reproduces her symptoms).  Abdominal: Soft. She exhibits no distension and no mass. There is no tenderness. There is no guarding.  Musculoskeletal: She exhibits no edema or tenderness.  Neurological: She is alert and oriented to person, place, and time.  Skin: Skin is warm and dry. She is not diaphoretic.  Psychiatric: She has a normal mood and affect. Her behavior is normal.  Nursing note and vitals reviewed.    ED Treatments / Results  Labs (all labs ordered are listed, but only abnormal results are displayed) Labs Reviewed  BASIC METABOLIC PANEL - Abnormal; Notable for the following:       Result Value   Potassium 3.0 (*)    Glucose, Bld 117 (*)    All other components within normal limits  CBC  I-STAT TROPONIN, ED    EKG  EKG Interpretation  Date/Time:  Saturday August 26 2016 04:17:43 EDT Ventricular Rate:  86 PR Interval:    QRS Duration: 79 QT Interval:  366 QTC Calculation: 438 R Axis:   19 Text Interpretation:  Sinus rhythm Probable left atrial enlargement No significant change since last tracing Confirmed by Melene PlanFloyd, Mathew Storck [phone]([id number]) on 08/26/2016 4:26:12 AM       Radiology Dg Chest 2 View  Result Date: 08/26/2016 CLINICAL DATA:  Left-sided chest pain. EXAM: CHEST  2 VIEW COMPARISON:  Radiographs 08/11/2016 FINDINGS: The cardiomediastinal contours are normal. The lungs are clear. Pulmonary vasculature is normal.  No consolidation, pleural effusion, or pneumothorax. No acute osseous abnormalities are seen. IMPRESSION: No acute pulmonary process. Electronically Signed   By: Rubye OaksMelanie  Ehinger M.D.   On: 08/26/2016 04:57    Procedures Procedures (including critical care time)  Medications Ordered in ED Medications  potassium chloride SA (K-DUR,KLOR-CON) CR tablet 40 mEq (not administered)     Initial Impression / Assessment and Plan / ED Course  I have reviewed the triage vital signs and the nursing notes.  Pertinent labs & imaging results that were available during my care of the patient were reviewed by me and considered in my medical decision making (see chart for details).     46 yo F With a chief complaint of left-sided sharp chest pain. This is reproduced on exam. Completely atypical of ACS. EKG troponin chest x-ray performed through triage unremarkable. She has mild hypokalemia  which I will replete orally. Completely atypical from PE as well. Discharge home.  5:34 AM:  I have discussed the diagnosis/risks/treatment options with the patient and believe the pt to be eligible for discharge home to follow-up with PCP. We also discussed returning to the ED immediately if new or worsening sx occur. We discussed the sx which are most concerning (e.g., sudden worsening pain, fever, inability to tolerate by mouth) that necessitate immediate return. Medications administered to the patient during their visit and any new prescriptions provided to the patient are listed below.  Medications given during this visit Medications  potassium chloride SA (K-DUR,KLOR-CON) CR tablet 40 mEq (not administered)     The patient appears reasonably screen and/or stabilized for discharge and I doubt any other medical condition or other [facility] requiring further screening, evaluation, or treatment in the ED at this time prior to discharge.    Final Clinical Impressions(s) / ED Diagnoses   Final diagnoses:  Chest wall pain     New Prescriptions New Prescriptions   No medications on file     Melene Plan, DO 08/26/16 1610

## 2016-08-26 NOTE — Discharge Instructions (Signed)
Take 4 over the counter ibuprofen tablets 3 times a day or 2 over-the-counter naproxen tablets twice a day for pain. Also take tylenol 1000mg(2 extra strength) four times a day.    

## 2017-04-16 ENCOUNTER — Encounter: Payer: Self-pay | Admitting: Family Medicine

## 2017-04-16 ENCOUNTER — Ambulatory Visit (INDEPENDENT_AMBULATORY_CARE_PROVIDER_SITE_OTHER): Payer: Self-pay | Admitting: Family Medicine

## 2017-04-16 VITALS — BP 146/98 | HR 74 | Temp 98.1°F | Resp 16 | Ht 62.0 in | Wt 206.0 lb

## 2017-04-16 DIAGNOSIS — Z1329 Encounter for screening for other suspected endocrine disorder: Secondary | ICD-10-CM

## 2017-04-16 DIAGNOSIS — R0789 Other chest pain: Secondary | ICD-10-CM

## 2017-04-16 DIAGNOSIS — Z130 Encounter for screening for diseases of the blood and blood-forming organs and certain disorders involving the immune mechanism: Secondary | ICD-10-CM

## 2017-04-16 DIAGNOSIS — Z23 Encounter for immunization: Secondary | ICD-10-CM

## 2017-04-16 DIAGNOSIS — Z131 Encounter for screening for diabetes mellitus: Secondary | ICD-10-CM

## 2017-04-16 DIAGNOSIS — R350 Frequency of micturition: Secondary | ICD-10-CM

## 2017-04-16 DIAGNOSIS — Z136 Encounter for screening for cardiovascular disorders: Secondary | ICD-10-CM

## 2017-04-16 DIAGNOSIS — I10 Essential (primary) hypertension: Secondary | ICD-10-CM

## 2017-04-16 LAB — POCT URINALYSIS DIP (DEVICE)
BILIRUBIN URINE: NEGATIVE
Glucose, UA: NEGATIVE mg/dL
Ketones, ur: NEGATIVE mg/dL
Leukocytes, UA: NEGATIVE
NITRITE: NEGATIVE
PH: 7 (ref 5.0–8.0)
Protein, ur: NEGATIVE mg/dL
Specific Gravity, Urine: 1.02 (ref 1.005–1.030)
Urobilinogen, UA: 0.2 mg/dL (ref 0.0–1.0)

## 2017-04-16 LAB — POCT GLYCOSYLATED HEMOGLOBIN (HGB A1C): HEMOGLOBIN A1C: 5.6

## 2017-04-16 MED ORDER — LISINOPRIL 30 MG PO TABS: 30.0000 mg | ORAL_TABLET | Freq: Every day | ORAL | 1 refills | Status: DC

## 2017-04-16 MED ORDER — HYDROCHLOROTHIAZIDE 25 MG PO TABS: 25.0000 mg | ORAL_TABLET | Freq: Every day | ORAL | 1 refills | Status: DC

## 2017-04-16 MED ORDER — LISINOPRIL 10 MG PO TABS: 30.0000 mg | ORAL_TABLET | Freq: Every day | ORAL | 0 refills | Status: DC

## 2017-04-16 MED FILL — LISINOPRIL 10 MG TABS: 10 | 30 days supply | Qty: 90 | Fill #0

## 2017-04-16 MED FILL — HYDROCHLOROTHIAZIDE 25 MG T: 25 | 30 days supply | Qty: 30 | Fill #0

## 2017-04-16 NOTE — Progress Notes (Signed)
Patient ID: Catherine Kane, female    DOB: [birth year], 47 y.o.   MRN: [id number]  PCP: Catherine Neighbors, FNP  Chief Complaint  Patient presents with  . Establish Care    Subjective:  HPI Catherine Kane is a 47 y.o. female with a history of  presents for evaluation of hypertension and chronic ongoing chest pain.  Patient reports that she had ran out of blood pressure medications and was noted to have elevated BP. She reports associated lower extremity swelling. Catherine Kane reports that she has experiencing intermittent chest pain since July 2018.  She also reports a history of anxiety which occasionally occurs at present.  She was seen in the emergency department for this complaint and it was diagnosis chest wall pain rule out to be of a cardiovascular origin.  She reports some shortness of breath with exertional activity such as climbing stairs although denies shortness of breath at rest.  Reports that she occasionally feels a sensation as if she cannot move when she suddenly awakens during sleep and during these episodes she is occasionally short of breath. She denies any history of syncope.  Reports at some point she was prescribed metoprolol however is uncertain as to why the medication was prescribed.  She denies any known history of atrial fibrillation and or SVT, or palpitations. She denies headache pain, new weakness, or dizziness. Currently she is not treated for any psychiatric disorders.  She is inactive of physical acitivty. She is a never smoker. Current Body mass index is 37.68 kg/m.    Social History   Socioeconomic History  . Marital status: Single    Spouse name: Not on file  . Number of children: Not on file  . Years of education: Not on file  . Highest education level: Not on file  Social Needs  . Financial resource strain: Not on file  . Food insecurity - worry: Not on file  . Food insecurity - inability: Not on file  . Transportation needs - medical:  Not on file  . Transportation needs - non-medical: Not on file  Occupational History  . Not on file  Tobacco Use  . Smoking status: Never Smoker  . Smokeless tobacco: Never Used  Substance and Sexual Activity  . Alcohol use: No  . Drug use: No  . Sexual activity: Yes  Other Topics Concern  . Not on file  Social History Narrative  . Not on file    No family history on file.   Review of Systems  Constitutional: Negative for fever, chills, diaphoresis, activity change, appetite change and fatigue. HENT: Negative for ear pain, nosebleeds, congestion, facial swelling, rhinorrhea, neck pain, neck stiffness and ear discharge.  Eyes: Negative for pain, discharge, redness, itching and visual disturbance. Respiratory: Negative for cough, choking, chest tightness, shortness of breath, wheezing and stridor.  Cardiovascular: Positive for chest pain, palpitations and leg swelling. Gastrointestinal: Negative for abdominal distention. Genitourinary: Negative for dysuria, urgency, positive frequency, hematuria, flank pain, decreased urine volume, difficulty urinating and dyspareunia.  Musculoskeletal: Negative for back pain, joint swelling, arthralgia and gait problem. Neurological: Negative for dizziness, tremors, seizures, syncope, facial asymmetry, speech difficulty, weakness, light-headedness, numbness and headaches.  Hematological: Negative for adenopathy. Does not bruise/bleed easily. Psychiatric/Behavioral: Negative for hallucinations, behavioral problems, confusion, dysphoric mood, decreased concentration and agitation. No Known Allergies  Prior to Admission medications   Medication Sig Start Date End Date Taking? Authorizing Provider  amLODipine (NORVASC) 10 MG tablet Take 10 mg by mouth daily.  [provider]  gemfibrozil (LOPID) 600 MG tablet Take 600 mg by mouth 2 (two) times daily before a meal.    [provider]  hydrochlorothiazide (MICROZIDE) 12.5 MG capsule  Take 1 capsule (12.5 mg total) by mouth daily. Patient not taking: Reported on 04/16/2017 08/11/16   Bethann Berkshire, MD  ibuprofen (ADVIL,MOTRIN) 800 MG tablet Take 1 tablet (800 mg total) by mouth every 8 (eight) hours as needed for mild pain or moderate pain. Patient not taking: Reported on 04/16/2017 07/07/14   Renford Dills, NP  lisinopril (PRINIVIL,ZESTRIL) 20 MG tablet Take 20 mg by mouth daily.    [provider]  naproxen (NAPROSYN) 500 MG tablet Take 1 tablet (500 mg total) by mouth 2 (two) times daily. Patient not taking: Reported on 04/16/2017 01/10/16   Cheri Fowler, PA-C  traMADol (ULTRAM) 50 MG tablet Take 1 tablet (50 mg total) by mouth every 6 (six) hours as needed. Patient not taking: Reported on 04/16/2017 08/11/16   Bethann Berkshire, MD    Past Medical, Surgical Family and Social History reviewed and updated.    Objective:   Today's Vitals   04/16/17 0852  BP: (!) 146/98  Pulse: 74  Resp: 16  Temp: 98.1 F (36.7 C)  TempSrc: Oral  SpO2: 99%  Weight: 206 lb (93.4 kg)  Height: 5\' 2"  (1.575 m)    Wt Readings from Last 3 Encounters:  04/16/17 206 lb (93.4 kg)  08/26/16 195 lb (88.5 kg)  08/11/16 195 lb (88.5 kg)    Physical Exam         Assessment & Plan:  1. Essential hypertension, uncontrolled today. Start Hydrochlorothiazide 25 mg once daily and increased lisinopril 30 mg once daily. Discontinue Amlodipine as you have lower extremity swelling. We have discussed target BP range and blood pressure goal. I have advised patient to check BP regularly and to call us back or report to clinic if the numbers are consistently higher than 140/90. We discussed the importance of compliance with medical therapy and DASH diet recommended, consequences of uncontrolled hypertension discussed. Checking a CMP and 12 Lead EKG.   2. Screening for diabetes mellitus, A1C today 5.6, which is normal.   3. Urinary frequency, patient is not a diabetic, urine dipstick is  significant for only a trace of RBC. Suspect this may be secondary to fluid overload. Watchful waiting at present to see if problem resolves with   4. Atypical chest pain, on-going since July 2018. However, she has a significant family hx of cardiovascular disease a early sudden death of mother from MI in her 29's. Ambulatory referral to Cardiology.  5. Need for immunization against influenza- Flu Vaccine QUAD 36+ mos IM  6. Screening for deficiency anemia- CBC with Differential  7. Screening for thyroid disorder- Thyroid Panel With TSH  8. Screening for cardiovascular condition- Lipid panel.  Patient not fasting and will return for fasting labs.   Meds ordered this encounter  Medications  . DISCONTD: lisinopril (PRINIVIL,ZESTRIL) 30 MG tablet    Sig: Take 1 tablet (30 mg total) by mouth daily.    Dispense:  90 tablet    Refill:  1    Order Specific Question:   Supervising Provider    Answer:   Quentin Angst [id number]  . hydrochlorothiazide (HYDRODIURIL) 25 MG tablet    Sig: Take 1 tablet (25 mg total) by mouth daily.    Dispense:  90 tablet    Refill:  1    Order Specific Question:  Supervising Provider    Answer:   Quentin AngstJEGEDE, OLUGBEMIGA E [id number]  . lisinopril (PRINIVIL,ZESTRIL) 10 MG tablet    Sig: Take 3 tablets (30 mg total) by mouth daily.    Dispense:  270 tablet    Refill:  0    Order Specific Question:   Supervising Provider    Answer:   Quentin AngstJEGEDE, OLUGBEMIGA E [[id number]]    RTC:  4 weeks fasting labs and BP check. 3 months chronic conditions and address over due health maintenance.      Godfrey PickKimberly S. Tiburcio PeaHarris, MSN, FNP-C [facility]  [address] [place], [place] [address] [phone]

## 2017-04-16 NOTE — Patient Instructions (Addendum)
Discontinue Amlodipine. Start Hydrochlorothiazide 25 mg once daily and increased Lisinopril 30 mg once daily. Goal BP <140/90.     DASH Eating Plan DASH stands for "Dietary Approaches to Stop Hypertension." The DASH eating plan is a healthy eating plan that has been shown to reduce high blood pressure (hypertension). It may also reduce your risk for type 2 diabetes, heart disease, and stroke. The DASH eating plan may also help with weight loss. What are tips for following this plan? General guidelines  Avoid eating more than 2,300 mg (milligrams) of salt (sodium) a day. If you have hypertension, you may need to reduce your sodium intake to 1,500 mg a day.  Limit alcohol intake to no more than 1 drink a day for nonpregnant women and 2 drinks a day for men. One drink equals 12 oz of beer, 5 oz of wine, or 1 oz of hard liquor.  Work with your health care provider to maintain a healthy body weight or to lose weight. Ask what an ideal weight is for you.  Get at least 30 minutes of exercise that causes your heart to beat faster (aerobic exercise) most days of the week. Activities may include walking, swimming, or biking.  Work with your health care provider or diet and nutrition specialist (dietitian) to adjust your eating plan to your individual calorie needs. Reading food labels  Check food labels for the amount of sodium per serving. Choose foods with less than 5 percent of the Daily Value of sodium. Generally, foods with less than 300 mg of sodium per serving fit into this eating plan.  To find whole grains, look for the word "whole" as the first word in the ingredient list. Shopping  Buy products labeled as "low-sodium" or "no salt added."  Buy fresh foods. Avoid canned foods and premade or frozen meals. Cooking  Avoid adding salt when cooking. Use salt-free seasonings or herbs instead of table salt or sea salt. Check with your health care provider or pharmacist before using salt  substitutes.  Do not fry foods. Cook foods using healthy methods such as baking, boiling, grilling, and broiling instead.  Cook with heart-healthy oils, such as olive, canola, soybean, or sunflower oil. Meal planning   Eat a balanced diet that includes: ? 5 or more servings of fruits and vegetables each day. At each meal, try to fill half of your plate with fruits and vegetables. ? Up to 6-8 servings of whole grains each day. ? Less than 6 oz of lean meat, poultry, or fish each day. A 3-oz serving of meat is about the same size as a deck of cards. One egg equals 1 oz. ? 2 servings of low-fat dairy each day. ? A serving of nuts, seeds, or beans 5 times each week. ? Heart-healthy fats. Healthy fats called Omega-3 fatty acids are found in foods such as flaxseeds and coldwater fish, like sardines, salmon, and mackerel.  Limit how much you eat of the following: ? Canned or prepackaged foods. ? Food that is high in trans fat, such as fried foods. ? Food that is high in saturated fat, such as fatty meat. ? Sweets, desserts, sugary drinks, and other foods with added sugar. ? Full-fat dairy products.  Do not salt foods before eating.  Try to eat at least 2 vegetarian meals each week.  Eat more home-cooked food and less restaurant, buffet, and fast food.  When eating at a restaurant, ask that your food be prepared with less salt or no  no salt, if possible. °What foods are recommended? °The items listed may not be a complete list. Talk with your dietitian about what dietary choices are best for you. °Grains °Whole-grain or whole-wheat bread. Whole-grain or whole-wheat pasta. Brown rice. Oatmeal. Quinoa. Bulgur. Whole-grain and low-sodium cereals. Pita bread. Low-fat, low-sodium crackers. Whole-wheat flour tortillas. °Vegetables °Fresh or frozen vegetables (raw, steamed, roasted, or grilled). Low-sodium or reduced-sodium tomato and vegetable juice. Low-sodium or reduced-sodium tomato sauce and tomato  paste. Low-sodium or reduced-sodium canned vegetables. °Fruits °All fresh, dried, or frozen fruit. Canned fruit in natural juice (without added sugar). °Meat and other protein foods °Skinless chicken or turkey. Ground chicken or turkey. Pork with fat trimmed off. Fish and seafood. Egg whites. Dried beans, peas, or lentils. Unsalted nuts, nut butters, and seeds. Unsalted canned beans. Lean cuts of beef with fat trimmed off. Low-sodium, lean deli meat. °Dairy °Low-fat (1%) or fat-free (skim) milk. Fat-free, low-fat, or reduced-fat cheeses. Nonfat, low-sodium ricotta or cottage cheese. Low-fat or nonfat yogurt. Low-fat, low-sodium cheese. °Fats and oils °Soft margarine without trans fats. Vegetable oil. Low-fat, reduced-fat, or light mayonnaise and salad dressings (reduced-sodium). Canola, safflower, olive, soybean, and sunflower oils. Avocado. °Seasoning and other foods °Herbs. Spices. Seasoning mixes without salt. Unsalted popcorn and pretzels. Fat-free sweets. °What foods are not recommended? °The items listed may not be a complete list. Talk with your dietitian about what dietary choices are best for you. °Grains °Baked goods made with fat, such as croissants, muffins, or some breads. Dry pasta or rice meal packs. °Vegetables °Creamed or fried vegetables. Vegetables in a cheese sauce. Regular canned vegetables (not low-sodium or reduced-sodium). Regular canned tomato sauce and paste (not low-sodium or reduced-sodium). Regular tomato and vegetable juice (not low-sodium or reduced-sodium). Pickles. Olives. °Fruits °Canned fruit in a light or heavy syrup. Fried fruit. Fruit in cream or butter sauce. °Meat and other protein foods °Fatty cuts of meat. Ribs. Fried meat. Bacon. Sausage. Bologna and other processed lunch meats. Salami. Fatback. Hotdogs. Bratwurst. Salted nuts and seeds. Canned beans with added salt. Canned or smoked fish. Whole eggs or egg yolks. Chicken or turkey with skin. °Dairy °Whole or 2% milk,  cream, and half-and-half. Whole or full-fat cream cheese. Whole-fat or sweetened yogurt. Full-fat cheese. Nondairy creamers. Whipped toppings. Processed cheese and cheese spreads. °Fats and oils °Butter. Stick margarine. Lard. Shortening. Ghee. Bacon fat. Tropical oils, such as coconut, palm kernel, or palm oil. °Seasoning and other foods °Salted popcorn and pretzels. Onion salt, garlic salt, seasoned salt, table salt, and sea salt. Worcestershire sauce. Tartar sauce. Barbecue sauce. Teriyaki sauce. Soy sauce, including reduced-sodium. Steak sauce. Canned and packaged gravies. Fish sauce. Oyster sauce. Cocktail sauce. Horseradish that you find on the shelf. Ketchup. Mustard. Meat flavorings and tenderizers. Bouillon cubes. Hot sauce and Tabasco sauce. Premade or packaged marinades. Premade or packaged taco seasonings. Relishes. Regular salad dressings. °Where to find more information: °· National Heart, Lung, and Blood Institute: www.nhlbi.nih.gov °· American Heart Association: www.heart.org °Summary °· The DASH eating plan is a healthy eating plan that has been shown to reduce high blood pressure (hypertension). It may also reduce your risk for type 2 diabetes, heart disease, and stroke. °· With the DASH eating plan, you should limit salt (sodium) intake to 2,300 mg a day. If you have hypertension, you may need to reduce your sodium intake to 1,500 mg a day. °· When on the DASH eating plan, aim to eat more fresh fruits and vegetables, whole grains, lean proteins, low-fat dairy, and   fats.  Work with your health care provider or diet and nutrition specialist (dietitian) to adjust your eating plan to your individual calorie needs. This information is not intended to replace advice given to you by your health care provider. Make sure you discuss any questions you have with your health care provider. Document Released: 01/12/2011 Document Revised: 01/17/2016 Document Reviewed: 01/17/2016 Elsevier  Interactive Patient Education  [employer]2018 Elsevier Inc.

## 2017-04-18 ENCOUNTER — Telehealth: Payer: Self-pay

## 2017-04-18 NOTE — Telephone Encounter (Signed)
Please print letter for patient and contact her to pick-up.

## 2017-04-20 ENCOUNTER — Encounter: Payer: Self-pay | Admitting: Cardiology

## 2017-04-20 ENCOUNTER — Ambulatory Visit (INDEPENDENT_AMBULATORY_CARE_PROVIDER_SITE_OTHER): Payer: Self-pay | Admitting: Cardiology

## 2017-04-20 VITALS — BP 118/88 | HR 68 | Ht 64.0 in | Wt 205.0 lb

## 2017-04-20 DIAGNOSIS — R0609 Other forms of dyspnea: Secondary | ICD-10-CM

## 2017-04-20 DIAGNOSIS — R002 Palpitations: Secondary | ICD-10-CM

## 2017-04-20 DIAGNOSIS — R0789 Other chest pain: Secondary | ICD-10-CM

## 2017-04-20 NOTE — Patient Instructions (Signed)
Medication Instructions:  Your provider recommends that you continue on your current medications as directed. Please refer to the Current Medication list given to you today.    Labwork: None  Testing/Procedures: Your provider has requested that you have an echocardiogram. Echocardiography is a painless test that uses sound waves to create images of your heart. It provides your doctor with information about the size and shape of your heart and how well your heart's chambers and valves are working. This procedure takes approximately one hour. There are no restrictions for this procedure.    Your provider has requested that you have an exercise tolerance test. For further information please visit [email or web address]. Please also follow instruction sheet, as given.  Follow-Up: Your provider recommends that you schedule a follow-up appointment AS NEEDED pending study results.  Any Other Special Instructions Will Be Listed Below (If Applicable).     If you need a refill on your cardiac medications before your next appointment, please call your pharmacy.

## 2017-04-20 NOTE — Progress Notes (Signed)
Cardiology Office Note:    Date:  04/20/2017   ID:  Catherine Kane, DOB 20-Oct-1970, MRN [id number]  PCP:  Bing Neighbors, FNP  Cardiologist:  No primary care provider on file.   Referring MD: Bing Neighbors, FNP     History of Present Illness:    Catherine Kane is a 47 y.o. female here for the evaluation of atypical chest pain, dyspnea on exertion at the request of Joaquin Courts.  She was seen previously in the emergency department back in July 2018 with left-sided sharp chest pain usually lasting seconds at a time which seemed to be worse with twisting with no exertional symptoms.  No edema, no trauma, no smoking.  No diabetes.  She is also had some trouble with her hypertension.  First anxiety attack 2011, got up in AM, mouth burning, heart coming out of chest. Happens with stress, CP, down arm and neck. Once a month. When lay sown start flutter and cough. Feel SOB with stairs. 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 Sometimes feels orthopnea. Maybe PND.  Sometimes she feels a gasp in the middle of the night.  Occasionally she will have what she thinks is sleep paralysis as well.  She has hypertension, multiple family members having myocardial infarctions in their 38s. Mother and sister died in 3's with heart issues.   At one point was on statin. At one point was on metoprolol.   Past Medical History:  Diagnosis Date  . Asthma   . Bronchitis   . Hypertension     Past Surgical History:  Procedure Laterality Date  . TONSILLECTOMY      Current Medications: Current Meds  Medication Sig  . hydrochlorothiazide (HYDRODIURIL) 25 MG tablet Take 1 tablet (25 mg total) by mouth daily.  Marland Kitchen ibuprofen (ADVIL,MOTRIN) 800 MG tablet Take 1 tablet (800 mg total) by mouth every 8 (eight) hours as needed for mild pain or moderate pain.  Marland Kitchen lisinopril (PRINIVIL,ZESTRIL) 10 MG tablet Take 3 tablets (30 mg total) by mouth daily.     Allergies:   Patient has no  known allergies.   Social History   Socioeconomic History  . Marital status: Single    Spouse name: None  . Number of children: None  . Years of education: None  . Highest education level: None  Social Needs  . Financial resource strain: None  . Food insecurity - worry: None  . Food insecurity - inability: None  . Transportation needs - medical: None  . Transportation needs - non-medical: None  Occupational History  . None  Tobacco Use  . Smoking status: Never Smoker  . Smokeless tobacco: Never Used  Substance and Sexual Activity  . Alcohol use: No  . Drug use: No  . Sexual activity: Yes  Other Topics Concern  . None  Social History Narrative  . None     Family History: The patient's family history includes Heart disease in her mother and sister.  ROS:   Please see the history of present illness.     All other systems reviewed and are negative.  EKGs/Labs/Other Studies Reviewed:    The following studies were reviewed today: Prior lab work, troponins were normal, creatinine normal at 0.8, prior office note reviewed.  EKG:  EKG is ordered today.  The ekg ordered today demonstrates 04/20/17-sinus rhythm 76 with no other abnormalities.  Personally viewed.  Recent Labs: 08/26/2016: BUN 11; Creatinine, Ser 0.81; Hemoglobin 14.5; Platelets 319; Potassium 3.0; Sodium  140  Recent Lipid Panel No results found for: CHOL, TRIG, HDL, CHOLHDL, VLDL, LDLCALC, LDLDIRECT  Physical Exam:    VS:  BP 118/88   Pulse 68   Ht 5\' 4"  (1.626 m)   Wt 205 lb (93 kg)   LMP 04/09/2017   SpO2 98%   BMI 35.19 kg/m     Wt Readings from Last 3 Encounters:  04/20/17 205 lb (93 kg)  04/16/17 206 lb (93.4 kg)  08/26/16 195 lb (88.5 kg)     GEN:  Well nourished, well developed in no acute distress, overweight HEENT: Normal NECK: No JVD; No carotid bruits LYMPHATICS: No lymphadenopathy CARDIAC: RRR, no murmurs, rubs, gallops RESPIRATORY:  Clear to auscultation without rales, wheezing  or rhonchi  ABDOMEN: Soft, non-tender, non-distended MUSCULOSKELETAL:  No edema; No deformity  SKIN: Warm and dry NEUROLOGIC:  Alert and oriented x 3 PSYCHIATRIC:  Normal affect   ASSESSMENT:    1. DOE (dyspnea on exertion)   2. Atypical chest pain   3. Palpitations    PLAN:    In order of problems listed above:  Atypical chest pain/dyspnea on exertion -We will go ahead and check an exercise treadmill test to ensure that there are no high risk features. -I will also check an echocardiogram to ensure proper structure and function of her heart.  She has early family history of MI with her mother and sister.  Prevention  Primary prevention -She is going to have her lipids checked soon with her primary provider.  Essential hypertension -Currently being managed by her primary care provider, Joaquin CourtsKimberly Harris.  Excellent job.  Blood pressure is wonderful today.  Palpitations -Flutter-like sensation likely PVCs or PACs before bedtime.  These are commonly seen especially when one is relaxing prior to sleep.  This should be benign.  She has no high risk features such as syncope.  Sleep disturbance -Sensation of gasping could be related to sleep apnea, she is also describing some symptoms of sleep paralysis.  Consider sleep study in the future.  Medication Adjustments/Labs and Tests Ordered: Current medicines are reviewed at length with the patient today.  Concerns regarding medicines are outlined above.  Orders Placed This Encounter  Procedures  . EXERCISE TOLERANCE TEST (ETT)  . EKG 12-Lead  . ECHOCARDIOGRAM COMPLETE   No orders of the defined types were placed in this encounter.   Signed, Donato SchultzMark Skains, MD  04/20/2017 8:26 AM    [facility]

## 2017-04-26 ENCOUNTER — Ambulatory Visit (HOSPITAL_COMMUNITY): Payer: Self-pay | Attending: Cardiology

## 2017-04-26 ENCOUNTER — Other Ambulatory Visit: Payer: Self-pay

## 2017-04-26 ENCOUNTER — Ambulatory Visit (INDEPENDENT_AMBULATORY_CARE_PROVIDER_SITE_OTHER): Payer: Self-pay

## 2017-04-26 DIAGNOSIS — R0609 Other forms of dyspnea: Secondary | ICD-10-CM

## 2017-04-26 DIAGNOSIS — Z8249 Family history of ischemic heart disease and other diseases of the circulatory system: Secondary | ICD-10-CM | POA: Insufficient documentation

## 2017-04-26 DIAGNOSIS — J40 Bronchitis, not specified as acute or chronic: Secondary | ICD-10-CM | POA: Insufficient documentation

## 2017-04-26 DIAGNOSIS — I119 Hypertensive heart disease without heart failure: Secondary | ICD-10-CM | POA: Insufficient documentation

## 2017-04-26 LAB — EXERCISE TOLERANCE TEST
CSEPED: 6 min
CSEPEDS: 0 s
CSEPHR: 96 %
CSEPPHR: 169 {beats}/min
Estimated workload: 7 METS
MPHR: 176 {beats}/min
RPE: 17
Rest HR: 71 {beats}/min

## 2017-05-14 ENCOUNTER — Ambulatory Visit (INDEPENDENT_AMBULATORY_CARE_PROVIDER_SITE_OTHER): Payer: Self-pay | Admitting: Family Medicine

## 2017-05-14 ENCOUNTER — Other Ambulatory Visit: Payer: Self-pay | Admitting: Family Medicine

## 2017-05-14 DIAGNOSIS — Z130 Encounter for screening for diseases of the blood and blood-forming organs and certain disorders involving the immune mechanism: Secondary | ICD-10-CM

## 2017-05-14 DIAGNOSIS — I10 Essential (primary) hypertension: Secondary | ICD-10-CM

## 2017-05-14 DIAGNOSIS — Z136 Encounter for screening for cardiovascular disorders: Secondary | ICD-10-CM

## 2017-05-14 DIAGNOSIS — Z1329 Encounter for screening for other suspected endocrine disorder: Secondary | ICD-10-CM

## 2017-05-14 LAB — POCT URINALYSIS DIP (MANUAL ENTRY)
BILIRUBIN UA: NEGATIVE mg/dL
Bilirubin, UA: NEGATIVE
Glucose, UA: NEGATIVE mg/dL
LEUKOCYTES UA: NEGATIVE
NITRITE UA: NEGATIVE
PH UA: 7 (ref 5.0–8.0)
PROTEIN UA: NEGATIVE mg/dL
Spec Grav, UA: 1.02 (ref 1.010–1.025)
Urobilinogen, UA: 0.2 E.U./dL

## 2017-05-14 MED ORDER — NAPROXEN 500 MG PO TABS: 500.0000 mg | ORAL_TABLET | Freq: Two times a day (BID) | ORAL | 2 refills | Status: DC

## 2017-05-14 MED FILL — NAPROXEN 500 MG TABLET: 500 | 30 days supply | Qty: 60 | Fill #0

## 2017-05-14 NOTE — Progress Notes (Signed)
Bp check today. BP elevated however patient has not taken medication. Advised nurse she is experiencing back pain. Agreed to call in Naproxen and will check urine while in office to rule out UTI.

## 2017-05-14 NOTE — Progress Notes (Signed)
Patient was advise to come back in 2 weeks to do a blood pressure check and to take her medicine once she eats.

## 2017-05-15 LAB — CBC WITH DIFFERENTIAL/PLATELET
BASOS: 2 %
Basophils Absolute: 0.1 10*3/uL (ref 0.0–0.2)
EOS (ABSOLUTE): 0.1 10*3/uL (ref 0.0–0.4)
EOS: 3 %
HEMATOCRIT: 43.1 % (ref 34.0–46.6)
Hemoglobin: 14.3 g/dL (ref 11.1–15.9)
IMMATURE GRANS (ABS): 0 10*3/uL (ref 0.0–0.1)
IMMATURE GRANULOCYTES: 0 %
LYMPHS: 51 %
Lymphocytes Absolute: 1.8 10*3/uL (ref 0.7–3.1)
MCH: 28.7 pg (ref 26.6–33.0)
MCHC: 33.2 g/dL (ref 31.5–35.7)
MCV: 86 fL (ref 79–97)
MONOCYTES: 9 %
MONOS ABS: 0.3 10*3/uL (ref 0.1–0.9)
Neutrophils Absolute: 1.2 10*3/uL — ABNORMAL LOW (ref 1.4–7.0)
Neutrophils: 35 %
Platelets: 340 10*3/uL (ref 150–379)
RBC: 4.99 x10E6/uL (ref 3.77–5.28)
RDW: 13.5 % (ref 12.3–15.4)
WBC: 3.5 10*3/uL (ref 3.4–10.8)

## 2017-05-15 LAB — COMPREHENSIVE METABOLIC PANEL
ALK PHOS: 98 IU/L (ref 39–117)
ALT: 17 IU/L (ref 0–32)
AST: 16 IU/L (ref 0–40)
Albumin/Globulin Ratio: 1.3 (ref 1.2–2.2)
Albumin: 4.1 g/dL (ref 3.5–5.5)
BUN/Creatinine Ratio: 13 (ref 9–23)
BUN: 10 mg/dL (ref 6–24)
Bilirubin Total: 0.2 mg/dL (ref 0.0–1.2)
CALCIUM: 9.3 mg/dL (ref 8.7–10.2)
CO2: 24 mmol/L (ref 20–29)
CREATININE: 0.76 mg/dL (ref 0.57–1.00)
Chloride: 102 mmol/L (ref 96–106)
GFR calc Af Amer: 110 mL/min/{1.73_m2} (ref >59)
GFR, EST NON AFRICAN AMERICAN: 96 mL/min/{1.73_m2} (ref >59)
GLUCOSE: 118 mg/dL — AB (ref 65–99)
Globulin, Total: 3.1 g/dL (ref 1.5–4.5)
Potassium: 3.6 mmol/L (ref 3.5–5.2)
Sodium: 140 mmol/L (ref 134–144)
Total Protein: 7.2 g/dL (ref 6.0–8.5)

## 2017-05-15 LAB — LIPID PANEL
CHOLESTEROL TOTAL: 154 mg/dL (ref 100–199)
Chol/HDL Ratio: 4.2 ratio (ref 0.0–4.4)
HDL: 37 mg/dL — ABNORMAL LOW (ref >39)
LDL CALC: 99 mg/dL (ref 0–99)
Triglycerides: 88 mg/dL (ref 0–149)
VLDL CHOLESTEROL CAL: 18 mg/dL (ref 5–40)

## 2017-05-15 LAB — THYROID PANEL WITH TSH
Free Thyroxine Index: 2 (ref 1.2–4.9)
T3 UPTAKE RATIO: 29 % (ref 24–39)
T4 TOTAL: 7 ug/dL (ref 4.5–12.0)
TSH: 0.392 u[IU]/mL — ABNORMAL LOW (ref 0.450–4.500)

## 2017-05-28 ENCOUNTER — Telehealth: Payer: Self-pay | Admitting: Family Medicine

## 2017-05-28 NOTE — Telephone Encounter (Signed)
Contact patient to advise her labs were consistent with her baseline with the exception of her Thyroid Hormone level was low. This is new although at this point only requires monitoring. She will need a repeat thyroid panel checked at her appointment scheduled for 07/17/2017

## 2017-05-28 NOTE — Telephone Encounter (Signed)
Patient notified

## 2017-06-16 ENCOUNTER — Emergency Department (HOSPITAL_COMMUNITY)
Admission: EM | Admit: 2017-06-16 | Discharge: 2017-06-16 | Disposition: A | Discharge status: Home / Self Care | Payer: Self-pay | Attending: Emergency Medicine | Admitting: Emergency Medicine

## 2017-06-16 ENCOUNTER — Emergency Department (HOSPITAL_COMMUNITY): Payer: Self-pay

## 2017-06-16 ENCOUNTER — Encounter (HOSPITAL_COMMUNITY): Payer: Self-pay

## 2017-06-16 ENCOUNTER — Other Ambulatory Visit: Payer: Self-pay

## 2017-06-16 DIAGNOSIS — I10 Essential (primary) hypertension: Secondary | ICD-10-CM | POA: Insufficient documentation

## 2017-06-16 DIAGNOSIS — Z79899 Other long term (current) drug therapy: Secondary | ICD-10-CM | POA: Insufficient documentation

## 2017-06-16 DIAGNOSIS — M79672 Pain in left foot: Secondary | ICD-10-CM | POA: Insufficient documentation

## 2017-06-16 DIAGNOSIS — J45909 Unspecified asthma, uncomplicated: Secondary | ICD-10-CM | POA: Insufficient documentation

## 2017-06-16 MED ORDER — TRAMADOL HCL 50 MG PO TABS: 50.0000 mg | ORAL_TABLET | Freq: Four times a day (QID) | ORAL | 0 refills | Status: DC | PRN

## 2017-06-16 MED ORDER — OXYCODONE-ACETAMINOPHEN 5-325 MG PO TABS
1.0000 | ORAL_TABLET | Freq: Once | ORAL | Status: AC
Start: 2017-06-16 — End: 2017-06-16
  Administered 2017-06-16: 1 via ORAL
  Filled 2017-06-16: qty 1

## 2017-06-16 NOTE — Discharge Instructions (Signed)
X-ray was reassuring.  No broken bones.  Please take Aleve 500 mg twice a day for pain.  I have also written a prescription for tramadol which she can take as needed for pain.  Follow-up with podiatrist.  Please also follow-up with your regular doctor to have your blood pressure rechecked as it was elevated in the ER today.

## 2017-06-16 NOTE — ED Triage Notes (Signed)
Pt endorses left foot pain x 1 week, denies injury, worse with ambulation. VSS

## 2017-06-16 NOTE — ED Provider Notes (Signed)
[facility] EMERGENCY DEPARTMENT Provider Note   CSN: [id number] Arrival date & time: 06/16/17  1720     History   Chief Complaint Chief Complaint  Patient presents with  . Foot Pain    HPI Catherine Kane is a 47 y.o. female.  HPI   [name] is a 47 year old female with a history of hypertension who presents to the emergency department for evaluation of left foot pain.  Patient denies inciting injury, but states that about a week ago she noticed pain on the dorsal lateral aspect of the left foot.  She reports that she can feel a bump over the area of pain.  Pain radiates up the leg with weightbearing.  She states the pain is 6/10 in severity and feels "throbbing, aching and sore."  She has tried taking naproxen without significant improvement.  Also she reports a tingling sensation over the ball of the foot, denies loss of sensation.  She denies fevers, chills, redness, numbness, weakness, open wound, arthralgias elsewhere.  Denies previous surgeries to this foot.  She is able to ambulate independently, although painful.  Past Medical History:  Diagnosis Date  . Asthma   . Bronchitis   . Hypertension     There are no active problems to display for this patient.   Past Surgical History:  Procedure Laterality Date  . TONSILLECTOMY       OB History   None      Home Medications    Prior to Admission medications   Medication Sig Start Date End Date Taking? Authorizing Provider  hydrochlorothiazide (HYDRODIURIL) 25 MG tablet Take 1 tablet (25 mg total) by mouth daily. 04/16/17   Bing Neighbors, FNP  ibuprofen (ADVIL,MOTRIN) 800 MG tablet Take 1 tablet (800 mg total) by mouth every 8 (eight) hours as needed for mild pain or moderate pain. 07/07/14   Renford Dills, NP  lisinopril (PRINIVIL,ZESTRIL) 10 MG tablet Take 3 tablets (30 mg total) by mouth daily. 04/16/17 07/15/17  Bing Neighbors, FNP  naproxen (NAPROSYN) 500 MG tablet Take 1 tablet  (500 mg total) by mouth 2 (two) times daily with a meal. 05/14/17 05/14/18  Bing Neighbors, FNP    Family History Family History  Problem Relation Age of Onset  . Heart disease Mother   . Heart disease Sister     Social History Social History   Tobacco Use  . Smoking status: Never Smoker  . Smokeless tobacco: Never Used  Substance Use Topics  . Alcohol use: No  . Drug use: No     Allergies   Patient has no known allergies.   Review of Systems Review of Systems  Constitutional: Negative for chills and fever.  Musculoskeletal: Positive for arthralgias (left foot pain). Negative for gait problem.  Skin: Negative for color change and wound.  Neurological: Negative for weakness and numbness.     Physical Exam Updated Vital Signs BP (!) 144/91 (BP Location: Right Arm)   Pulse (!) 54   Temp 98.6 F (37 C) (Oral)   Resp 18   Ht  (1.6 m)   Wt 91.6 kg (202 lb)   LMP 06/05/2017 (Exact Date)   SpO2 98%   BMI 35.78 kg/m   Physical Exam  Constitutional: She appears well-developed and well-nourished. No distress.  HENT:  Head: Normocephalic and atraumatic.  Eyes: Right eye exhibits no discharge. Left eye exhibits no discharge.  Pulmonary/Chest: Effort normal. No respiratory distress.  Musculoskeletal:  Feet:  Left foot with tenderness to palpation over the dorsal lateral aspect of the foot as depicted in image. Mild swelling noted. No erythema, ecchymosis or warmth overlying. No break in skin. Full ROM of the ankle. No pain to fifth metatarsal area or navicular region. Achilles intact. Good pedal pulse and cap refill of toes. Sensation to light touch intact in distal bilateral LE.   Neurological: She is alert. Coordination normal.  Skin: She is not diaphoretic.  Psychiatric: She has a normal mood and affect. Her behavior is normal.  Nursing note and vitals reviewed.    ED Treatments / Results  Labs (all labs ordered are listed, but only abnormal results  are displayed) Labs Reviewed - No data to display  EKG None  Radiology Dg Foot Complete Left  Result Date: 06/16/2017 CLINICAL DATA:  Dorsal foot pain approximately at the base of the fifth metatarsal starting 6 months ago. EXAM: LEFT FOOT - COMPLETE 3+ VIEW COMPARISON:  07/07/2014 FINDINGS: Normal bone mineralization of the left foot. No marginal nor extra articular erosions. No evidence of periostitis. Intact joint spaces without malalignment. Chronic stable, likely developmental ovoid lucency involving the posterolateral calcaneus, unchanged since prior 2016 comparison. Minimal calcaneal enthesopathy is seen. No acute fracture, periosteal new bone formation, nor bone destruction is identified. Well corticated rounded ossific densities project off the tip of the medial malleolus likely related to old remote trauma. Mild soft tissue swelling along the medial aspect of the hindfoot. IMPRESSION: Stable appearance of the left foot without acute osseous abnormality. Mild soft tissue swelling along the medial hindfoot. Electronically Signed   By: Tollie Eth M.D.   On: 06/16/2017 18:49    Procedures Procedures (including critical care time)  Medications Ordered in ED Medications  oxyCODONE-acetaminophen (PERCOCET/ROXICET) 5-325 MG per tablet 1 tablet (1 tablet Oral Given 06/16/17 2050)     Initial Impression / Assessment and Plan / ED Course  I have reviewed the triage vital signs and the nursing notes.  Pertinent labs & imaging results that were available during my care of the patient were reviewed by me and considered in my medical decision making (see chart for details).     X-ray left foot without acute abnormality.  No erythema, warmth or signs of infection. Left lower extremity neurovascularly intact.  Patient is able to ambulate independently.  Discussed NSAIDs, Tylenol and RICE protocol.  Will also discharge with a short course of tramadol.  Have counseled patient to follow-up with her  PCP if symptoms are not improving.  Have also given her information to follow-up with the podiatrist.  Her blood pressure was elevated in the ER today, counseled her to have this rechecked.  Patient agrees and voiced understanding to the above plan.  Final Clinical Impressions(s) / ED Diagnoses   Final diagnoses:  Foot pain, left    ED Discharge Orders        Ordered    traMADol (ULTRAM) 50 MG tablet  Every 6 hours PRN     06/16/17 2044       Lawrence Marseilles 06/17/17 1610    Tegeler, Canary Brim, MD 06/17/17 [phone]

## 2017-07-17 ENCOUNTER — Ambulatory Visit: Payer: Self-pay | Admitting: Family Medicine

## 2017-07-19 MED FILL — LISINOPRIL 10 MG TABS: 10 | 30 days supply | Qty: 90 | Fill #1

## 2017-07-19 MED FILL — HYDROCHLOROTHIAZIDE 25 MG T: 25 | 30 days supply | Qty: 30 | Fill #1

## 2017-07-19 MED FILL — NAPROXEN 500 MG TABLET: 500 | 30 days supply | Qty: 60 | Fill #1

## 2017-07-25 ENCOUNTER — Telehealth: Payer: Self-pay

## 2017-07-25 ENCOUNTER — Ambulatory Visit (INDEPENDENT_AMBULATORY_CARE_PROVIDER_SITE_OTHER): Payer: Self-pay | Admitting: Family Medicine

## 2017-07-25 ENCOUNTER — Encounter: Payer: Self-pay | Admitting: Family Medicine

## 2017-07-25 VITALS — BP 142/90 | HR 76 | Temp 98.0°F | Ht 63.0 in | Wt 198.0 lb

## 2017-07-25 DIAGNOSIS — Z09 Encounter for follow-up examination after completed treatment for conditions other than malignant neoplasm: Secondary | ICD-10-CM

## 2017-07-25 DIAGNOSIS — R5383 Other fatigue: Secondary | ICD-10-CM

## 2017-07-25 DIAGNOSIS — R8290 Unspecified abnormal findings in urine: Secondary | ICD-10-CM

## 2017-07-25 DIAGNOSIS — B001 Herpesviral vesicular dermatitis: Secondary | ICD-10-CM

## 2017-07-25 DIAGNOSIS — M5431 Sciatica, right side: Secondary | ICD-10-CM

## 2017-07-25 DIAGNOSIS — I10 Essential (primary) hypertension: Secondary | ICD-10-CM

## 2017-07-25 LAB — POCT URINALYSIS DIP (MANUAL ENTRY)
Glucose, UA: 100 mg/dL — AB
Nitrite, UA: NEGATIVE
Protein Ur, POC: >=300 mg/dL — AB
Spec Grav, UA: 1.01 (ref 1.010–1.025)
Urobilinogen, UA: 4 E.U./dL — AB
pH, UA: 8.5 — AB (ref 5.0–8.0)

## 2017-07-25 MED ORDER — KETOROLAC TROMETHAMINE 60 MG/2ML IM SOLN
60.0000 mg | Freq: Once | INTRAMUSCULAR | Status: AC
  Administered 2017-07-25: 60 mg via INTRAMUSCULAR

## 2017-07-25 MED ORDER — VALACYCLOVIR HCL 500 MG PO TABS
500.0000 mg | ORAL_TABLET | Freq: Two times a day (BID) | ORAL | 1 refills | Status: DC
Start: 2017-07-25 — End: 2017-07-25

## 2017-07-25 MED ORDER — NAPROXEN 500 MG PO TABS: 500.0000 mg | ORAL_TABLET | Freq: Two times a day (BID) | ORAL | 2 refills | Status: DC

## 2017-07-25 MED ORDER — TRAMADOL HCL 50 MG PO TABS: 50.0000 mg | ORAL_TABLET | Freq: Four times a day (QID) | ORAL | 0 refills | Status: DC | PRN

## 2017-07-25 MED ORDER — ACYCLOVIR 200 MG PO CAPS: 200.0000 mg | ORAL_CAPSULE | Freq: Two times a day (BID) | ORAL | 1 refills | Status: DC

## 2017-07-25 MED ORDER — FLUOXETINE HCL 10 MG PO TABS: 10.0000 mg | ORAL_TABLET | Freq: Every day | ORAL | 0 refills | Status: DC

## 2017-07-25 MED FILL — VALACYCLOVIR HCL 500 MG TAB: 500 | 10 days supply | Qty: 20 | Fill #0

## 2017-07-25 MED FILL — FLUoxetine HCL 10 MG CAPS: 10 | 30 days supply | Qty: 30 | Fill #0

## 2017-07-25 NOTE — Progress Notes (Signed)
Subjective:    Patient ID: Catherine Kane, female    DOB: [birth year], 47 y.o.   MRN: [id number]   PCP: Raliegh Ip, NP  Chief Complaint  Patient presents with  . Follow-up    3 month chronic conditon  . Hip Pain  . Knee Pain    HPI  Ms Iona Beard has history of Hypertension, Bronchitis, Asthma, Thyroid Disease, Stroke, Seizures, Renal Disorder, Diabetes, CAD, COPD, CHF, and Arthritis.   She is here for follow up.   Current Status: She is doing well with no complaints. She denies fevers, chills, recent infections, weight loss, and night sweats. She has increased fatigue. Reports mild dizziness. She has not had any headaches, visual changes, and falls. She reports cough and shortness of breath. No chest pain, heart palpitations, reported.   She has occasional nausea. No other reports of GI problems. She has no reports of blood in stools, dysuria and hematuria.   No depression or anxiety.   She has right back/hip pain, which she takes Motrin and Acetaminophen to help with pain.    Past Medical History:  Diagnosis Date  . Asthma   . Bronchitis   . Hypertension     Family History  Problem Relation Age of Onset  . Heart disease Mother   . Heart disease Sister     Social History   Socioeconomic History  . Marital status: Single    Spouse name: Not on file  . Number of children: Not on file  . Years of education: Not on file  . Highest education level: Not on file  Occupational History  . Not on file  Social Needs  . Financial resource strain: Not on file  . Food insecurity:    Worry: Not on file    Inability: Not on file  . Transportation needs:    Medical: Not on file    Non-medical: Not on file  Tobacco Use  . Smoking status: Never Smoker  . Smokeless tobacco: Never Used  Substance and Sexual Activity  . Alcohol use: No  . Drug use: No  . Sexual activity: Yes  Lifestyle  . Physical activity:    Days per week: Not on file    Minutes per session:  Not on file  . Stress: Not on file  Relationships  . Social connections:    Talks on phone: Not on file    Gets together: Not on file    Attends religious service: Not on file    Active member of club or organization: Not on file    Attends meetings of clubs or organizations: Not on file    Relationship status: Not on file  . Intimate partner violence:    Fear of current or ex partner: Not on file    Emotionally abused: Not on file    Physically abused: Not on file    Forced sexual activity: Not on file  Other Topics Concern  . Not on file  Social History Narrative  . Not on file    Past Surgical History:  Procedure Laterality Date  . TONSILLECTOMY     Immunization History  Administered Date(s) Administered  . Influenza,inj,Quad PF,6+ Mos 04/16/2017  . Tdap 07/07/2014    Current Meds  Medication Sig  . gabapentin (NEURONTIN) 300 MG capsule Take 300 mg by mouth 3 (three) times daily.  . hydrochlorothiazide (HYDRODIURIL) 25 MG tablet Take 1 tablet (25 mg total) by mouth daily.  Marland Kitchen lisinopril (PRINIVIL,ZESTRIL) 10 MG tablet Take  3 tablets (30 mg total) by mouth daily.    No Known Allergies  BP (!) 142/90 (BP Location: Left Arm, Patient Position: Sitting, Cuff Size: Large)   Pulse 76   Temp 98 F (36.7 C) (Oral)   Ht 5\' 3"  (1.6 m)   Wt 198 lb (89.8 kg)   LMP 07/22/2017   SpO2 98%   BMI 35.07 kg/m   Review of Systems  Constitutional: Negative.   HENT: Negative.   Eyes: Negative.   Respiratory: Negative.   Cardiovascular: Negative.   Gastrointestinal: Negative.   Endocrine: Negative.   Genitourinary: Negative.   Musculoskeletal: Negative.   Skin: Negative.   Allergic/Immunologic: Negative.   Neurological: Negative.   Hematological: Negative.   Psychiatric/Behavioral: Negative.    Objective:   Physical Exam  Constitutional: She is oriented to person, place, and time. She appears well-developed and well-nourished.  HENT:  Head: Normocephalic and  atraumatic.  Right Ear: External ear normal.  Left Ear: External ear normal.  Nose: Nose normal.  Mouth/Throat: Oropharynx is clear and moist.  Eyes: Pupils are equal, round, and reactive to light. Conjunctivae and EOM are normal.  Neck: Normal range of motion. Neck supple.  Cardiovascular: Normal rate, regular rhythm, normal heart sounds and intact distal pulses.  Pulmonary/Chest: Effort normal and breath sounds normal.  Abdominal: Soft. Bowel sounds are normal.  Musculoskeletal: Normal range of motion.  Neurological: She is alert and oriented to person, place, and time.  Skin: Skin is warm and dry. Capillary refill takes less than 2 seconds.  Psychiatric: She has a normal mood and affect. Her behavior is normal. Judgment and thought content normal.  Nursing note and vitals reviewed.  Assessment & Plan:   1. Essential hypertension Blood pressure is elevated at 164/92 today. She did not take medication this morning. She will continue HCTZ and Lisinopril as prescribed.  - POCT urinalysis dipstick - naproxen (NAPROSYN) 500 MG tablet; Take 1 tablet (500 mg total) by mouth 2 (two) times daily with a meal.  Dispense: 60 tablet; Refill: 2  2. Sciatic pain, right We will give her Toradol injection today. We will initiate Ultram for pain relief. She is currently scheduled to apply for [employer] in order to be referred to Orthopedics.  - ketorolac (TORADOL) injection 60 mg - traMADol (ULTRAM) 50 MG tablet; Take 1 tablet (50 mg total) by mouth every 6 (six) hours as needed.  Dispense: 14 tablet; Refill: 0  3. Fever blister - acyclovir (ZOVIRAX) 200 MG capsule; Take 1 capsule (200 mg total) by mouth 2 (two) times daily.  Dispense: 20 capsule; Refill: 1  4. Fatigue, unspecified type Stable today. Hgb is 14.3. We will check Vitamin D and Vitamin B-12 levels at next office visit.   5. Abnormal urinalysis - Urine Culture  6. Follow up She will follow up in 3 months.   Meds  ordered this encounter  Medications  . ketorolac (TORADOL) injection 60 mg  . DISCONTD: valACYclovir (VALTREX) 500 MG tablet    Sig: Take 1 tablet (500 mg total) by mouth 2 (two) times daily.    Dispense:  20 tablet    Refill:  1  . naproxen (NAPROSYN) 500 MG tablet    Sig: Take 1 tablet (500 mg total) by mouth 2 (two) times daily with a meal.    Dispense:  60 tablet    Refill:  2  . acyclovir (ZOVIRAX) 200 MG capsule    Sig: Take 1 capsule (200 mg total) by  mouth 2 (two) times daily.    Dispense:  20 capsule    Refill:  1  . traMADol (ULTRAM) 50 MG tablet    Sig: Take 1 tablet (50 mg total) by mouth every 6 (six) hours as needed.    Dispense:  14 tablet    Refill:  0    Raliegh IpNatalie Jawanna Dykman,  MSN, FNP-BC Patient [facility] [address]  [place], [place] [id number] [phone]

## 2017-07-25 NOTE — Telephone Encounter (Signed)
Medication refilled

## 2017-07-25 NOTE — Patient Instructions (Signed)
Ketorolac injection What is this medicine? KETOROLAC (kee toe ROLE ak) is a non-steroidal anti-inflammatory drug (NSAID). It is used to treat moderate to severe pain for up to 5 days. It is commonly used after surgery. This medicine should not be used for more than 5 days. This medicine may be used for other purposes; ask your health care provider or pharmacist if you have questions. COMMON BRAND NAME(S): Toradol What should I tell my health care provider before I take this medicine? They need to know if you have any of these conditions: -asthma, especially aspirin-sensitive asthma -bleeding problems -kidney disease -stomach bleed, ulcer, or other problem -taking aspirin, other NSAID, or probenecid -an unusual or allergic reaction to ketorolac, tromethamine, aspirin, other NSAIDs, other medicines, foods, dyes or preservatives -pregnant or trying to get pregnant -breast-feeding How should I use this medicine? This medicine is for injection into a muscle or into a vein. It is given by a health care professional in a hospital or clinic setting. Talk to your pediatrician regarding the use of this medicine in children. Special care may be needed. Patients over 49 years old may have a stronger reaction and need a smaller dose. Overdosage: If you think you have taken too much of this medicine contact a poison control center or emergency room at once. NOTE: This medicine is only for you. Do not share this medicine with others. What if I miss a dose? This does not apply. What may interact with this medicine? Do not take this medicine with any of the following medications: -aspirin and aspirin-like medicines -cidofovir -methotrexate -NSAIDs, medicines for pain and inflammation, like ibuprofen or naproxen -pentoxifylline -probenecid This medicine may also interact with the following  medications: -alcohol -alendronate -alprazolam -carbamazepine -diuretics -flavocoxid -fluoxetine -ginkgo -lithium -medicines for blood pressure like enalapril -medicines that affect platelets like pentoxifylline -medicines that treat or prevent blood clots like heparin, warfarin -muscle relaxants -pemetrexed -phenytoin -thiothixene This list may not describe all possible interactions. Give your health care provider a list of all the medicines, herbs, non-prescription drugs, or dietary supplements you use. Also tell them if you smoke, drink alcohol, or use illegal drugs. Some items may interact with your medicine. What should I watch for while using this medicine? Tell your doctor or healthcare professional if your symptoms do not start to get better or if they get worse. This medicine does not prevent heart attack or stroke. In fact, this medicine may increase the chance of a heart attack or stroke. The chance may increase with longer use of this medicine and in people who have heart disease. If you take aspirin to prevent heart attack or stroke, talk with your doctor or health care professional. Do not take medicines such as ibuprofen and naproxen with this medicine. Side effects such as stomach upset, nausea, or ulcers may be more likely to occur. Many medicines available without a prescription should not be taken with this medicine. This medicine can cause ulcers and bleeding in the stomach and intestines at any time during treatment. Do not smoke cigarettes or drink alcohol. These increase irritation to your stomach and can make it more susceptible to damage from this medicine. Ulcers and bleeding can happen without warning symptoms and can cause death. This medicine can cause you to bleed more easily. Try to avoid damage to your teeth and gums when you brush or floss your teeth. What side effects may I notice from receiving this medicine? Side effects that you should report to your  doctor or health care professional as soon as possible: -allergic reactions like skin rash, itching or hives, swelling of the face, lips, or tongue -breathing problems -high blood pressure -nausea, vomiting -redness, blistering, peeling or loosening of the skin, including inside the mouth -severe stomach pain -signs and symptoms of bleeding such as bloody or black, tarry stools; red or dark-brown urine; spitting up blood or brown material that looks like coffee grounds; red spots on the skin; unusual bruising or bleeding from the eye, gums, or nose -signs and symptoms of a blood clot changes in vision; chest pain; severe, sudden headache; trouble speaking; sudden numbness or weakness of the face, arm, or leg -trouble passing urine or change in the amount of urine -unexplained weight gain or swelling -unusually weak or tired -yellowing of eyes or skin Side effects that usually do not require medical attention (report to your doctor or health care professional if they continue or are bothersome): -diarrhea -dizziness -headache -heartburn This list may not describe all possible side effects. Call your doctor for medical advice about side effects. You may report side effects to FDA at 1-800-FDA-1088. Where should I keep my medicine? This drug is given in a hospital or clinic and will not be stored at home. NOTE: This sheet is a summary. It may not cover all possible information. If you have questions about this medicine, talk to your doctor, pharmacist, or health care provider.  2018 Elsevier/Gold Standard (2016-02-02 14:38:40) Acyclovir tablets or capsules What is this medicine? ACYCLOVIR (ay SYE kloe veer) is an antiviral medicine. It is used to treat or prevent infections caused by certain kinds of viruses. Examples of these infections include herpes and shingles. This medicine will not cure herpes. This medicine may be used for other purposes; ask your health care provider or pharmacist if  you have questions. COMMON BRAND NAME(S): Zovirax What should I tell my health care provider before I take this medicine? They need to know if you have any of these conditions: -kidney disease -an unusual or allergic reaction to acyclovir, ganciclovir, valacyclovir, other medicines, foods, dyes, or preservatives -pregnant or trying to get pregnant -breast-feeding How should I use this medicine? Take this medicine by mouth with a glass of water. Follow the directions on the prescription label. You can take it with or without food. Take your medicine at regular intervals. Do not take your medicine more often than directed. Take all of your medicine as directed even if you think your are better. Do not skip doses or stop your medicine early. Talk to your pediatrician regarding the use of this medicine in children. While this drug may be prescribed for selected conditions, precautions do apply. Overdosage: If you think you have taken too much of this medicine contact a poison control center or emergency room at once. NOTE: This medicine is only for you. Do not share this medicine with others. What if I miss a dose? If you miss a dose, take it as soon as you can. If it is almost time for your next dose, take only that dose. Do not take double or extra doses. What may interact with this medicine? -probenecid This list may not describe all possible interactions. Give your health care provider a list of all the medicines, herbs, non-prescription drugs, or dietary supplements you use. Also tell them if you smoke, drink alcohol, or use illegal drugs. Some items may interact with your medicine. What should I watch for while using this medicine? Tell your doctor or  health care professional if your symptoms do not improve. This medicine works best when started very early in the course of an infection. Begin treatment at the first signs of infection. Drink 6 to 8 glasses of water or fluids every day while you  are taking this medicine. This will help prevent side effects. You can still pass chickenpox, shingles, or herpes to another person even while you are taking this medicine. Avoid contact with others as directed. Genital herpes is a sexually transmitted disease. Talk to your doctor about how to stop the spread of infection. What side effects may I notice from receiving this medicine? Side effects that you should report to your doctor or health care professional as soon as possible: -allergic reactions like skin rash, itching or hives, swelling of the face, lips, or tongue -chest pain -confusion, hallucinations, tremor -dark urine -increased sensitivity to the sun -redness, blistering, peeling or loosening of the skin, including inside the mouth -seizures -trouble passing urine or change in the amount of urine -unusual bleeding or bruising, or pinpoint red spots on the skin -unusually weak or tired -yellowing of the eyes or skin Side effects that usually do not require medical attention (report to your doctor or health care professional if they continue or are bothersome): -diarrhea -fever -headache -nausea, vomiting -stomach upset This list may not describe all possible side effects. Call your doctor for medical advice about side effects. You may report side effects to FDA at 1-800-FDA-1088. Where should I keep my medicine? Keep out of the reach of children. Store at room temperature between 15 and 25 degrees C (59 and 77 degrees F). Throw away any unused medicine after the expiration date. NOTE: This sheet is a summary. It may not cover all possible information. If you have questions about this medicine, talk to your doctor, pharmacist, or health care provider.  2018 Elsevier/Gold Standard (2007-04-10 13:15:46)

## 2017-07-27 LAB — URINE CULTURE: Organism ID, Bacteria: NO GROWTH

## 2017-08-01 ENCOUNTER — Other Ambulatory Visit: Payer: Self-pay | Admitting: Family Medicine

## 2017-08-01 DIAGNOSIS — M5431 Sciatica, right side: Secondary | ICD-10-CM

## 2017-08-01 MED ORDER — TRAMADOL HCL 50 MG PO TABS: 50.0000 mg | ORAL_TABLET | Freq: Four times a day (QID) | ORAL | 0 refills | Status: DC | PRN

## 2017-08-01 NOTE — Progress Notes (Signed)
Rx for Ultram [facility] to [facility] today.

## 2017-08-02 ENCOUNTER — Other Ambulatory Visit: Payer: Self-pay | Admitting: Family Medicine

## 2017-08-02 DIAGNOSIS — R319 Hematuria, unspecified: Secondary | ICD-10-CM

## 2017-08-02 DIAGNOSIS — N390 Urinary tract infection, site not specified: Secondary | ICD-10-CM

## 2017-09-06 MED FILL — traMADol HCL 50 MG TABS: 50 | 3 days supply | Qty: 14 | Fill #0

## 2017-09-12 ENCOUNTER — Ambulatory Visit (INDEPENDENT_AMBULATORY_CARE_PROVIDER_SITE_OTHER): Payer: Self-pay | Admitting: Family Medicine

## 2017-09-12 ENCOUNTER — Encounter: Payer: Self-pay | Admitting: Family Medicine

## 2017-09-12 VITALS — BP 146/92 | HR 66 | Temp 98.2°F | Ht 63.0 in | Wt 194.0 lb

## 2017-09-12 DIAGNOSIS — R1011 Right upper quadrant pain: Secondary | ICD-10-CM

## 2017-09-12 DIAGNOSIS — R0602 Shortness of breath: Secondary | ICD-10-CM

## 2017-09-12 DIAGNOSIS — Z09 Encounter for follow-up examination after completed treatment for conditions other than malignant neoplasm: Secondary | ICD-10-CM

## 2017-09-12 DIAGNOSIS — M549 Dorsalgia, unspecified: Secondary | ICD-10-CM

## 2017-09-12 DIAGNOSIS — Z131 Encounter for screening for diabetes mellitus: Secondary | ICD-10-CM

## 2017-09-12 DIAGNOSIS — M5431 Sciatica, right side: Secondary | ICD-10-CM

## 2017-09-12 DIAGNOSIS — R8290 Unspecified abnormal findings in urine: Secondary | ICD-10-CM

## 2017-09-12 LAB — POCT URINALYSIS DIP (MANUAL ENTRY)
Bilirubin, UA: NEGATIVE
Glucose, UA: NEGATIVE mg/dL
Ketones, POC UA: NEGATIVE mg/dL
Leukocytes, UA: NEGATIVE
Nitrite, UA: NEGATIVE
Protein Ur, POC: NEGATIVE mg/dL
Spec Grav, UA: 1.025 (ref 1.010–1.025)
Urobilinogen, UA: 0.2 E.U./dL
pH, UA: 5.5 (ref 5.0–8.0)

## 2017-09-12 LAB — POCT GLYCOSYLATED HEMOGLOBIN (HGB A1C): Hemoglobin A1C: 5.2 % (ref 4.0–5.6)

## 2017-09-12 MED ORDER — ALBUTEROL SULFATE HFA 108 (90 BASE) MCG/ACT IN AERS
2.0000 | INHALATION_SPRAY | Freq: Four times a day (QID) | RESPIRATORY_TRACT | 11 refills | Status: DC | PRN
Start: 2017-09-12 — End: 2018-09-30

## 2017-09-12 MED ORDER — GABAPENTIN 300 MG PO CAPS: 300.0000 mg | ORAL_CAPSULE | Freq: Three times a day (TID) | ORAL | 3 refills | Status: DC

## 2017-09-12 MED FILL — GABAPENTIN 300 MG CAPSULE: 300 | 30 days supply | Qty: 90 | Fill #0

## 2017-09-12 MED FILL — !VENTOLIN HFA INHALER: 108 (90 BAS | 25 days supply | Qty: 18 | Fill #0

## 2017-09-12 NOTE — Patient Instructions (Addendum)
Albuterol inhalation aerosol What is this medicine? ALBUTEROL (al BYOO ter ole) is a bronchodilator. It helps open up the airways in your lungs to make it easier to breathe. This medicine is used to treat and to prevent bronchospasm. This medicine may be used for other purposes; ask your health care provider or pharmacist if you have questions. COMMON BRAND NAME(S): Proair HFA, Proventil, Proventil HFA, Respirol, Ventolin, Ventolin HFA What should I tell my health care provider before I take this medicine? They need to know if you have any of the following conditions: -diabetes -heart disease or irregular heartbeat -high blood pressure -pheochromocytoma -seizures -thyroid disease -an unusual or allergic reaction to albuterol, levalbuterol, sulfites, other medicines, foods, dyes, or preservatives -pregnant or trying to get pregnant -breast-feeding How should I use this medicine? This medicine is for inhalation through the mouth. Follow the directions on your prescription label. Take your medicine at regular intervals. Do not use more often than directed. Make sure that you are using your inhaler correctly. Ask you doctor or health care provider if you have any questions. Talk to your pediatrician regarding the use of this medicine in children. Special care may be needed. Overdosage: If you think you have taken too much of this medicine contact a poison control center or emergency room at once. NOTE: This medicine is only for you. Do not share this medicine with others. What if I miss a dose? If you miss a dose, use it as soon as you can. If it is almost time for your next dose, use only that dose. Do not use double or extra doses. What may interact with this medicine? -anti-infectives like chloroquine and pentamidine -caffeine -cisapride -diuretics -medicines for colds -medicines for depression or for emotional or psychotic conditions -medicines for weight loss including some herbal  products -methadone -some antibiotics like clarithromycin, erythromycin, levofloxacin, and linezolid -some heart medicines -steroid hormones like dexamethasone, cortisone, hydrocortisone -theophylline -thyroid hormones This list may not describe all possible interactions. Give your health care provider a list of all the medicines, herbs, non-prescription drugs, or dietary supplements you use. Also tell them if you smoke, drink alcohol, or use illegal drugs. Some items may interact with your medicine. What should I watch for while using this medicine? Tell your doctor or health care professional if your symptoms do not improve. Do not use extra albuterol. If your asthma or bronchitis gets worse while you are using this medicine, call your doctor right away. If your mouth gets dry try chewing sugarless gum or sucking hard candy. Drink water as directed. What side effects may I notice from receiving this medicine? Side effects that you should report to your doctor or health care professional as soon as possible: -allergic reactions like skin rash, itching or hives, swelling of the face, lips, or tongue -breathing problems -chest pain -feeling faint or lightheaded, falls -high blood pressure -irregular heartbeat -fever -muscle cramps or weakness -pain, tingling, numbness in the hands or feet -vomiting Side effects that usually do not require medical attention (report to your doctor or health care professional if they continue or are bothersome): -cough -difficulty sleeping -headache -nervousness or trembling -stomach upset -stuffy or runny nose -throat irritation -unusual taste This list may not describe all possible side effects. Call your doctor for medical advice about side effects. You may report side effects to FDA at 1-800-FDA-1088. Where should I keep my medicine? Keep out of the reach of children. Store at room temperature between 15 and 30 degrees   C (59 and 86 degrees F). The  contents are under pressure and may burst when exposed to heat or flame. Do not freeze. This medicine does not work as well if it is too cold. Throw away any unused medicine after the expiration date. Inhalers need to be thrown away after the labeled number of puffs have been used or by the expiration date; whichever comes first. Ventolin HFA should be thrown away 12 months after removing from foil pouch. Check the instructions that come with your medicine. NOTE: This sheet is a summary. It may not cover all possible information. If you have questions about this medicine, talk to your doctor, pharmacist, or health care provider.  2018 Elsevier/Gold Standard (2012-07-11 10:57:17) Gabapentin capsules or tablets What is this medicine? GABAPENTIN (GA ba pen tin) is used to control partial seizures in adults with epilepsy. It is also used to treat certain types of nerve pain. This medicine may be used for other purposes; ask your health care provider or pharmacist if you have questions. COMMON BRAND NAME(S): Active-PAC with Gabapentin, Gabarone, Neurontin What should I tell my health care provider before I take this medicine? They need to know if you have any of these conditions: -kidney disease -suicidal thoughts, plans, or attempt; a previous suicide attempt by you or a family member -an unusual or allergic reaction to gabapentin, other medicines, foods, dyes, or preservatives -pregnant or trying to get pregnant -breast-feeding How should I use this medicine? Take this medicine by mouth with a glass of water. Follow the directions on the prescription label. You can take it with or without food. If it upsets your stomach, take it with food.Take your medicine at regular intervals. Do not take it more often than directed. Do not stop taking except on your doctor's advice. If you are directed to break the 600 or 800 mg tablets in half as part of your dose, the extra half tablet should be used for the next  dose. If you have not used the extra half tablet within 28 days, it should be thrown away. A special MedGuide will be given to you by the pharmacist with each prescription and refill. Be sure to read this information carefully each time. Talk to your pediatrician regarding the use of this medicine in children. Special care may be needed. Overdosage: If you think you have taken too much of this medicine contact a poison control center or emergency room at once. NOTE: This medicine is only for you. Do not share this medicine with others. What if I miss a dose? If you miss a dose, take it as soon as you can. If it is almost time for your next dose, take only that dose. Do not take double or extra doses. What may interact with this medicine? Do not take this medicine with any of the following medications: -other gabapentin products This medicine may also interact with the following medications: -alcohol -antacids -antihistamines for allergy, cough and cold -certain medicines for anxiety or sleep -certain medicines for depression or psychotic disturbances -homatropine; hydrocodone -naproxen -narcotic medicines (opiates) for pain -phenothiazines like chlorpromazine, mesoridazine, prochlorperazine, thioridazine This list may not describe all possible interactions. Give your health care provider a list of all the medicines, herbs, non-prescription drugs, or dietary supplements you use. Also tell them if you smoke, drink alcohol, or use illegal drugs. Some items may interact with your medicine. What should I watch for while using this medicine? Visit your doctor or health care professional for regular  checks on your progress. You may want to keep a record at home of how you feel your condition is responding to treatment. You may want to share this information with your doctor or health care professional at each visit. You should contact your doctor or health care professional if your seizures get worse  or if you have any new types of seizures. Do not stop taking this medicine or any of your seizure medicines unless instructed by your doctor or health care professional. Stopping your medicine suddenly can increase your seizures or their severity. Wear a medical identification bracelet or chain if you are taking this medicine for seizures, and carry a card that lists all your medications. You may get drowsy, dizzy, or have blurred vision. Do not drive, use machinery, or do anything that needs mental alertness until you know how this medicine affects you. To reduce dizzy or fainting spells, do not sit or stand up quickly, especially if you are an older patient. Alcohol can increase drowsiness and dizziness. Avoid alcoholic drinks. Your mouth may get dry. Chewing sugarless gum or sucking hard candy, and drinking plenty of water will help. The use of this medicine may increase the chance of suicidal thoughts or actions. Pay special attention to how you are responding while on this medicine. Any worsening of mood, or thoughts of suicide or dying should be reported to your health care professional right away. Women who become pregnant while using this medicine may enroll in the [place] [employer] Antiepileptic Drug Pregnancy Registry by calling [phone]. This registry collects information about the safety of antiepileptic drug use during pregnancy. What side effects may I notice from receiving this medicine? Side effects that you should report to your doctor or health care professional as soon as possible: -allergic reactions like skin rash, itching or hives, swelling of the face, lips, or tongue -worsening of mood, thoughts or actions of suicide or dying Side effects that usually do not require medical attention (report to your doctor or health care professional if they continue or are bothersome): -constipation -difficulty walking or controlling muscle movements -dizziness -nausea -slurred  speech -tiredness -tremors -weight gain This list may not describe all possible side effects. Call your doctor for medical advice about side effects. You may report side effects to FDA at 1-800-FDA-1088. Where should I keep my medicine? Keep out of reach of children. This medicine may cause accidental overdose and death if it taken by other adults, children, or pets. Mix any unused medicine with a substance like cat litter or coffee grounds. Then throw the medicine away in a sealed container like a sealed bag or a coffee can with a lid. Do not use the medicine after the expiration date. Store at room temperature between 15 and 30 degrees C (59 and 86 degrees F). NOTE: This sheet is a summary. It may not cover all possible information. If you have questions about this medicine, talk to your doctor, pharmacist, or health care provider.  2018 Elsevier/Gold Standard (2013-03-21 15:26:50)

## 2017-09-12 NOTE — Progress Notes (Signed)
Sick Visit  Subjective:    Patient ID: Catherine Kane, female    DOB: 07-Sep-1970, 47 y.o.   MRN: [id number]   Chief Complaint  Patient presents with  . Abdominal Pain  . Urinary Frequency  . Back Pain    HPI  Catherine Kane has a past medical history of Hypertension, Bronchitis, and Asthma. She is here today for right upper quadrant pain.    Current Status: Since her last office visit, she has recently within the last week began to have upper right quadrant pain and tender after eating and intermittently at night. No reports of GI problems such as nausea, vomiting, diarrhea, and constipation. She has no reports of blood in stools, dysuria and hematuria. She continues to have chronic back pain. She reports occasional shortness of breath.   She denies fevers, chills, fatigue, recent infections, weight loss, and night sweats. She has not had any headaches, visual changes, dizziness, and falls. No chest pain, heart palpitations, cough reported.  No depression or anxiety reported today.   Past Medical History:  Diagnosis Date  . Asthma   . Bronchitis   . Hypertension     Family History  Problem Relation Age of Onset  . Heart disease Mother   . Heart disease Sister    Social History   Socioeconomic History  . Marital status: Single    Spouse name: Not on file  . Number of children: Not on file  . Years of education: Not on file  . Highest education level: Not on file  Occupational History  . Not on file  Social Needs  . Financial resource strain: Not on file  . Food insecurity:    Worry: Not on file    Inability: Not on file  . Transportation needs:    Medical: Not on file    Non-medical: Not on file  Tobacco Use  . Smoking status: Never Smoker  . Smokeless tobacco: Never Used  Substance and Sexual Activity  . Alcohol use: No  . Drug use: No  . Sexual activity: Yes  Lifestyle  . Physical activity:    Days per week: Not on file    Minutes per session: Not on  file  . Stress: Not on file  Relationships  . Social connections:    Talks on phone: Not on file    Gets together: Not on file    Attends religious service: Not on file    Active member of club or organization: Not on file    Attends meetings of clubs or organizations: Not on file    Relationship status: Not on file  . Intimate partner violence:    Fear of current or ex partner: Not on file    Emotionally abused: Not on file    Physically abused: Not on file    Forced sexual activity: Not on file  Other Topics Concern  . Not on file  Social History Narrative  . Not on file    Past Surgical History:  Procedure Laterality Date  . TONSILLECTOMY     Immunization History  Administered Date(s) Administered  . Influenza,inj,Quad PF,6+ Mos 04/16/2017  . Tdap 07/07/2014    Current Meds  Medication Sig  . acyclovir (ZOVIRAX) 200 MG capsule Take 1 capsule (200 mg total) by mouth 2 (two) times daily.  Marland Kitchen FLUoxetine (PROZAC) 10 MG tablet Take 1 tablet (10 mg total) by mouth daily.  . hydrochlorothiazide (HYDRODIURIL) 25 MG tablet Take 1 tablet (25 mg total) by  mouth daily.  Marland Kitchen. lisinopril (PRINIVIL,ZESTRIL) 10 MG tablet Take 3 tablets (30 mg total) by mouth daily.  . traMADol (ULTRAM) 50 MG tablet Take 1 tablet (50 mg total) by mouth every 6 (six) hours as needed.  . [DISCONTINUED] gabapentin (NEURONTIN) 300 MG capsule Take 300 mg by mouth 3 (three) times daily.    No Known Allergies  BP (!) 146/92   Pulse 66   Temp 98.2 F (36.8 C) (Oral)   Ht 5\' 3"  (1.6 m)   Wt 194 lb (88 kg)   LMP 08/15/2017   SpO2 100%   BMI 34.37 kg/m    Review of Systems  Constitutional: Negative.   HENT: Negative.   Eyes: Negative.   Respiratory: Negative.   Cardiovascular: Negative.   Gastrointestinal: Negative.   Endocrine: Negative.   Genitourinary: Positive for flank pain.       Left   Musculoskeletal: Positive for back pain.  Skin: Negative.   Allergic/Immunologic: Negative.    Neurological: Negative.   Hematological: Negative.   Psychiatric/Behavioral: Negative.     Objective:   Physical Exam  Constitutional: She appears well-developed and well-nourished.  HENT:  Head: Normocephalic and atraumatic.  Mouth/Throat: Oropharynx is clear and moist.  Eyes: Pupils are equal, round, and reactive to light. EOM are normal.  Cardiovascular: Normal rate, regular rhythm, normal heart sounds and intact distal pulses.  Pulmonary/Chest: Effort normal and breath sounds normal.  Abdominal: Soft. Normal appearance and normal aorta. Bowel sounds are absent. There is tenderness in the right upper quadrant and epigastric area.  Skin: Skin is warm and dry. Capillary refill takes less than 2 seconds.  Nursing note and vitals reviewed.  Assessment & Plan:   1. Right upper quadrant pain We will send order for Abdominal Ultrasound today.  - FAST US; Future  2. Shortness of breath Stable. We will send refill for Albuterol Inhaler.  - albuterol (PROVENTIL HFA;VENTOLIN HFA) 108 (90 Base) MCG/ACT inhaler; Inhale 2 puffs into the lungs every 6 (six) hours as needed for wheezing or shortness of breath.  Dispense: 1 Inhaler; Refill: 11  3. Back pain, unspecified back location, unspecified back pain laterality, unspecified chronicity We will initiate Gabepentin today.  - gabapentin (NEURONTIN) 300 MG capsule; Take 1 capsule (300 mg total) by mouth 3 (three) times daily.  Dispense: 90 capsule; Refill: 3  4. Sciatic pain, right Moderate pain today. We will initiate Gabapentin today. Monitor.  - gabapentin (NEURONTIN) 300 MG capsule; Take 1 capsule (300 mg total) by mouth 3 (three) times daily.  Dispense: 90 capsule; Refill: 3  5. Screening for diabetes mellitus Hgb A1c is normal at 5.2 today.  She will continue to decrease foods/beverages high in sugars and carbs and follow Heart Healthy or DASH diet. Increase physical activity to at least 30 minutes cardio exercise daily.   - POCT  glycosylated hemoglobin (Hb A1C) - POCT urinalysis dipstick  6. Abnormal urinalysis - Urine Culture  7. Follow up She will follow up in 1 month.  Meds ordered this encounter  Medications  . albuterol (PROVENTIL HFA;VENTOLIN HFA) 108 (90 Base) MCG/ACT inhaler    Sig: Inhale 2 puffs into the lungs every 6 (six) hours as needed for wheezing or shortness of breath.    Dispense:  1 Inhaler    Refill:  11  . gabapentin (NEURONTIN) 300 MG capsule    Sig: Take 1 capsule (300 mg total) by mouth 3 (three) times daily.    Dispense:  90 capsule  Refill:  3   Raliegh Ip,  MSN, FNP-C [facility] [address] [place], [place] [address] [phone]  Meds ordered this encounter  Medications  . albuterol (PROVENTIL HFA;VENTOLIN HFA) 108 (90 Base) MCG/ACT inhaler    Sig: Inhale 2 puffs into the lungs every 6 (six) hours as needed for wheezing or shortness of breath.    Dispense:  1 Inhaler    Refill:  11  . gabapentin (NEURONTIN) 300 MG capsule    Sig: Take 1 capsule (300 mg total) by mouth 3 (three) times daily.    Dispense:  90 capsule    Refill:  3

## 2017-09-14 ENCOUNTER — Ambulatory Visit: Payer: Self-pay | Attending: Family Medicine

## 2017-09-14 ENCOUNTER — Telehealth: Payer: Self-pay

## 2017-09-14 LAB — URINE CULTURE: Organism ID, Bacteria: NO GROWTH

## 2017-09-14 NOTE — Telephone Encounter (Signed)
Where we suppose to order a US on patient

## 2017-09-16 NOTE — Telephone Encounter (Signed)
Patient was last seen on 09/12/2017. Order was placed for Abdominal Ultrasound. Not sure ifs she will be covered. I remember asking you about this, and you said that she would have to be approved. Could you please research this further for me? I informed patient of this when she was checking out.   (Please let me know if I can assist you with this)  Thanks so much Catherine Kane!  Lebaron Bautch.

## 2017-09-17 ENCOUNTER — Other Ambulatory Visit: Payer: Self-pay | Admitting: Family Medicine

## 2017-09-17 DIAGNOSIS — R1011 Right upper quadrant pain: Secondary | ICD-10-CM

## 2017-09-17 DIAGNOSIS — R198 Other specified symptoms and signs involving the digestive system and abdomen: Secondary | ICD-10-CM

## 2017-09-17 NOTE — Telephone Encounter (Signed)
Order in. Thanks!

## 2017-09-17 NOTE — Telephone Encounter (Signed)
Patient notified of appointment on 8/14 at 8:15 for US.

## 2017-09-17 NOTE — Progress Notes (Signed)
Order for Abdominal US placed today.

## 2017-09-17 NOTE — Telephone Encounter (Signed)
Tried to call and schedule and order is not in system.

## 2017-09-19 ENCOUNTER — Ambulatory Visit (HOSPITAL_COMMUNITY)
Admission: RE | Admit: 2017-09-19 | Discharge: 2017-09-19 | Disposition: A | Discharge status: Home / Self Care | Payer: Self-pay | Source: Ambulatory Visit | Attending: Family Medicine | Admitting: Family Medicine

## 2017-09-19 DIAGNOSIS — R198 Other specified symptoms and signs involving the digestive system and abdomen: Secondary | ICD-10-CM | POA: Insufficient documentation

## 2017-09-19 DIAGNOSIS — R1011 Right upper quadrant pain: Secondary | ICD-10-CM | POA: Insufficient documentation

## 2017-09-21 ENCOUNTER — Ambulatory Visit: Payer: Self-pay

## 2017-09-21 NOTE — Telephone Encounter (Signed)
Patient would like to know what next steps are since ultrasound was normal

## 2017-09-25 ENCOUNTER — Other Ambulatory Visit: Payer: Self-pay | Admitting: Family Medicine

## 2017-09-25 DIAGNOSIS — K219 Gastro-esophageal reflux disease without esophagitis: Secondary | ICD-10-CM

## 2017-09-25 MED ORDER — PANTOPRAZOLE SODIUM 20 MG PO TBEC: 20.0000 mg | DELAYED_RELEASE_TABLET | Freq: Every day | ORAL | 3 refills | Status: DC

## 2017-09-25 NOTE — Progress Notes (Signed)
Abdominal US and most recent labs reviewed with patient today.   Rx for Protonix to pharmacy today.

## 2017-09-26 NOTE — Telephone Encounter (Signed)
Spoke to patient the other morning.  Thanks.

## 2017-10-03 MED FILL — PANTOPRAZOLE SOD DR 20 MG T: 20 | 30 days supply | Qty: 30 | Fill #0

## 2017-10-03 MED FILL — HYDROCHLOROTHIAZIDE 25 MG T: 25 | 30 days supply | Qty: 30 | Fill #2

## 2017-10-03 MED FILL — LISINOPRIL 10 MG TABS: 10 | 30 days supply | Qty: 90 | Fill #2

## 2017-10-16 ENCOUNTER — Ambulatory Visit: Payer: Self-pay | Admitting: Family Medicine

## 2017-10-26 ENCOUNTER — Ambulatory Visit: Payer: Self-pay | Admitting: Family Medicine

## 2017-10-31 ENCOUNTER — Ambulatory Visit: Payer: Self-pay | Admitting: Family Medicine

## 2017-11-11 ENCOUNTER — Emergency Department (HOSPITAL_COMMUNITY): Payer: Self-pay

## 2017-11-11 ENCOUNTER — Emergency Department (HOSPITAL_COMMUNITY)
Admission: EM | Admit: 2017-11-11 | Discharge: 2017-11-11 | Disposition: A | Discharge status: Home / Self Care | Payer: Self-pay | Attending: Emergency Medicine | Admitting: Emergency Medicine

## 2017-11-11 ENCOUNTER — Encounter (HOSPITAL_COMMUNITY): Payer: Self-pay | Admitting: Emergency Medicine

## 2017-11-11 DIAGNOSIS — I10 Essential (primary) hypertension: Secondary | ICD-10-CM | POA: Insufficient documentation

## 2017-11-11 DIAGNOSIS — J069 Acute upper respiratory infection, unspecified: Secondary | ICD-10-CM | POA: Insufficient documentation

## 2017-11-11 DIAGNOSIS — J45909 Unspecified asthma, uncomplicated: Secondary | ICD-10-CM | POA: Insufficient documentation

## 2017-11-11 DIAGNOSIS — Z79899 Other long term (current) drug therapy: Secondary | ICD-10-CM | POA: Insufficient documentation

## 2017-11-11 LAB — POC URINE PREG, ED: Preg Test, Ur: NEGATIVE

## 2017-11-11 MED ORDER — BENZONATATE 100 MG PO CAPS: 100.0000 mg | ORAL_CAPSULE | Freq: Three times a day (TID) | ORAL | 0 refills | Status: AC

## 2017-11-11 MED ORDER — IPRATROPIUM-ALBUTEROL 0.5-2.5 (3) MG/3ML IN SOLN
3.0000 mL | Freq: Once | RESPIRATORY_TRACT | Status: AC
  Administered 2017-11-11: 3 mL via RESPIRATORY_TRACT
  Filled 2017-11-11: qty 3

## 2017-11-11 NOTE — ED Triage Notes (Signed)
Pt presents with bilat ear pain and sore throat x 1 wk with chills and subjective fever

## 2017-11-11 NOTE — ED Provider Notes (Signed)
[facility] EMERGENCY DEPARTMENT Provider Note   CSN: [id number] Arrival date & time: 11/11/17  1704     History   Chief Complaint Chief Complaint  Patient presents with  . Otalgia  . Sore Throat    HPI Catherine Kane is a 47 y.o. female.  47 y/o female with a PMH of Bronchitis, Asthma and HTN presents to the ED with a chief complaint of cough, nasal congestion and left ear pain x 1 week. Patient reports she began to have pain on her left ear then the rhinorrhea began and now she's had a cough for a couple of days. She reports the cough is worst at night. She has tried dayquil but reports no relieve in symptoms. She does have a previous history of asthma but has not been hospitalized for asthma exacerbation this year or previous years. She denies any shortness of breath, chest pain, abdominal pain or fever.   The history is provided by the patient and the spouse.  Otalgia  Associated symptoms include rhinorrhea, sore throat and cough. Pertinent negatives include no abdominal pain.  Sore Throat  Pertinent negatives include no chest pain, no abdominal pain and no shortness of breath.    Past Medical History:  Diagnosis Date  . Asthma   . Bronchitis   . Hypertension     There are no active problems to display for this patient.   Past Surgical History:  Procedure Laterality Date  . TONSILLECTOMY       OB History   None      Home Medications    Prior to Admission medications   Medication Sig Start Date End Date Taking? Authorizing Provider  acyclovir (ZOVIRAX) 200 MG capsule Take 1 capsule (200 mg total) by mouth 2 (two) times daily. 07/25/17   Kallie Locks, FNP  albuterol (PROVENTIL HFA;VENTOLIN HFA) 108 (90 Base) MCG/ACT inhaler Inhale 2 puffs into the lungs every 6 (six) hours as needed for wheezing or shortness of breath. 09/12/17   Kallie Locks, FNP  benzonatate (TESSALON) 100 MG capsule Take 1 capsule (100 mg total) by mouth  every 8 (eight) hours for 7 days. 11/11/17 11/18/17  Claude Manges, PA-C  FLUoxetine (PROZAC) 10 MG tablet Take 1 tablet (10 mg total) by mouth daily. 07/25/17   Kallie Locks, FNP  gabapentin (NEURONTIN) 300 MG capsule Take 1 capsule (300 mg total) by mouth 3 (three) times daily. 09/12/17   Kallie Locks, FNP  hydrochlorothiazide (HYDRODIURIL) 25 MG tablet Take 1 tablet (25 mg total) by mouth daily. 04/16/17   Bing Neighbors, FNP  ibuprofen (ADVIL,MOTRIN) 800 MG tablet Take 1 tablet (800 mg total) by mouth every 8 (eight) hours as needed for mild pain or moderate pain. Patient not taking: Reported on 07/25/2017 07/07/14   Renford Dills, NP  lisinopril (PRINIVIL,ZESTRIL) 10 MG tablet Take 3 tablets (30 mg total) by mouth daily. 04/16/17 09/12/17  Bing Neighbors, FNP  pantoprazole (PROTONIX) 20 MG tablet Take 1 tablet (20 mg total) by mouth daily. 09/25/17 09/25/18  Kallie Locks, FNP  traMADol (ULTRAM) 50 MG tablet Take 1 tablet (50 mg total) by mouth every 6 (six) hours as needed. 08/01/17   Kallie Locks, FNP    Family History Family History  Problem Relation Age of Onset  . Heart disease Mother   . Heart disease Sister     Social History Social History   Tobacco Use  . Smoking status: Never Smoker  .  Smokeless tobacco: Never Used  Substance Use Topics  . Alcohol use: No  . Drug use: No     Allergies   Patient has no known allergies.   Review of Systems Review of Systems  Constitutional: Negative for fever.  HENT: Positive for ear pain, rhinorrhea and sore throat.   Respiratory: Positive for cough and wheezing. Negative for shortness of breath.   Cardiovascular: Negative for chest pain.  Gastrointestinal: Negative for abdominal pain.  All other systems reviewed and are negative.    Physical Exam Updated Vital Signs BP (!) 176/112 (BP Location: Right Arm)   Pulse 93   Temp 97.8 F (36.6 C) (Oral)   Resp 20   Ht 5\' 4"  (1.626 m)   Wt 88.9 kg   LMP  10/30/2017   SpO2 98%   BMI 33.64 kg/m   Physical Exam  Constitutional: She is oriented to person, place, and time. She appears well-developed and well-nourished.  HENT:  Head: Normocephalic and atraumatic.  Right Ear: Tympanic membrane is not injected, not scarred, not erythematous, not retracted and not bulging.  Left Ear: Tympanic membrane is not injected, not scarred, not erythematous, not retracted and not bulging.  Mouth/Throat: Uvula is midline and mucous membranes are normal. Posterior oropharyngeal erythema present. No tonsillar exudate.  Neck: Normal range of motion. Neck supple.  Cardiovascular: Normal heart sounds.  Pulmonary/Chest: Effort normal. She has decreased breath sounds. She has wheezes.  Slight wheezing on expiration on left lower lung field, diminished breath sounds patient is a smoker.   Abdominal: Soft. Bowel sounds are normal.  Lymphadenopathy:       Head (right side): Submandibular adenopathy present.       Head (left side): Submandibular adenopathy present.  Neurological: She is alert and oriented to person, place, and time.  Skin: Skin is warm and dry.  Nursing note and vitals reviewed.    ED Treatments / Results  Labs (all labs ordered are listed, but only abnormal results are displayed) Labs Reviewed  POC URINE PREG, ED    EKG None  Radiology Dg Chest 2 View  Result Date: 11/11/2017 CLINICAL DATA:  BILATERAL ear pain and sore throat for 1 week, chills, subjective fever, history asthma and bronchitis EXAM: CHEST - 2 VIEW COMPARISON:  08/26/2016 FINDINGS: Borderline enlargement of cardiac silhouette. Mediastinal contours and pulmonary vascularity normal. Minimal subsegmental atelectasis at RIGHT base. Lungs otherwise clear. No acute infiltrate, pleural effusion or pneumothorax. Bones unremarkable. IMPRESSION: Minimal subsegmental atelectasis at RIGHT base. Electronically Signed   By: Ulyses Southward M.D.   On: 11/11/2017 18:24     Procedures Procedures (including critical care time)  Medications Ordered in ED Medications  ipratropium-albuterol (DUONEB) 0.5-2.5 (3) MG/3ML nebulizer solution 3 mL (3 mLs Nebulization Given 11/11/17 1749)     Initial Impression / Assessment and Plan / ED Course  I have reviewed the triage vital signs and the nursing notes.  Pertinent labs & imaging results that were available during my care of the patient were reviewed by me and considered in my medical decision making (see chart for details).    Patient presents with cough and otalgia x 1 week.She does have a previous history of asthma, I have obtain a DG chest to rule out any pneumonia.  DG chest showed no acute infiltrate, pleural effusion or pneumothorax, consolidation.  Does have a previous history of bronchitis her x-ray does show a minimal subsegmental atelectasis at right base.PAtient has decreased lungs sounds and slight wheezing will provide her  with a breathing treatment as she reports she has not used her inhaler today.   Upon examination TMs are non-erythematous, bulging, perforated.  There is swelling to her submandibular lymph node region.  Patient also reports she is been coughing for a week nonproductive.  Lungs sound are improved after breathing treatment.  At this time I will treat patient symptomatically.  Patient understands that she is to return to the ED if her symptoms worsen but this should get better within a week.  I will also prescribe her some Tessalon Perles to help with the cough.  Patient's vitals have been stable during visit, patient is stable for discharge.Return precautions provided.    Final Clinical Impressions(s) / ED Diagnoses   Final diagnoses:  Upper respiratory tract infection, unspecified type    ED Discharge Orders         Ordered    benzonatate (TESSALON) 100 MG capsule  Every 8 hours     11/11/17 1913           Claude Manges, Cordelia Poche 11/11/17 Johnnye Sima,  MD 11/12/17 [phone]

## 2017-11-11 NOTE — Discharge Instructions (Addendum)
Please purchase an expectorant like Mucinex to help loosen and thin the mucus production.You may also try some over the counter Robitussin to help with your cough. Continue to hydrate with plenty of fluids and Gatorade.If you experience any shortness of breath, chest pain or fever please return to the ED for reevaluation.  ° °

## 2017-11-12 MED FILL — BENZONATATE 100 MG CAP: 100 | 7 days supply | Qty: 21 | Fill #0

## 2017-11-19 ENCOUNTER — Telehealth: Payer: Self-pay

## 2017-11-19 NOTE — Telephone Encounter (Signed)
Patient notified that we have not received paperwork from [employer] as of yet.

## 2017-11-20 ENCOUNTER — Telehealth: Payer: Self-pay

## 2017-11-20 NOTE — Telephone Encounter (Signed)
Spoke with patient to remind of appointment tomorrow 11/21/2017 and patient will keep this appointment. Thanks!

## 2017-11-21 ENCOUNTER — Telehealth: Payer: Self-pay

## 2017-11-21 ENCOUNTER — Ambulatory Visit: Payer: Self-pay | Admitting: Family Medicine

## 2017-11-21 NOTE — Telephone Encounter (Signed)
Patient will bring in FMLA paperwork on 11/23/2017

## 2017-11-23 ENCOUNTER — Ambulatory Visit: Payer: Self-pay | Admitting: Family Medicine

## 2017-11-26 ENCOUNTER — Encounter: Payer: Self-pay | Admitting: Family Medicine

## 2017-11-26 ENCOUNTER — Ambulatory Visit (INDEPENDENT_AMBULATORY_CARE_PROVIDER_SITE_OTHER): Payer: Self-pay | Admitting: Family Medicine

## 2017-11-26 VITALS — BP 142/98 | HR 76 | Temp 98.2°F | Ht 64.0 in | Wt 198.0 lb

## 2017-11-26 DIAGNOSIS — R05 Cough: Secondary | ICD-10-CM

## 2017-11-26 DIAGNOSIS — I10 Essential (primary) hypertension: Secondary | ICD-10-CM

## 2017-11-26 DIAGNOSIS — R0989 Other specified symptoms and signs involving the circulatory and respiratory systems: Secondary | ICD-10-CM

## 2017-11-26 DIAGNOSIS — M5431 Sciatica, right side: Secondary | ICD-10-CM

## 2017-11-26 DIAGNOSIS — R059 Cough, unspecified: Secondary | ICD-10-CM

## 2017-11-26 DIAGNOSIS — R0602 Shortness of breath: Secondary | ICD-10-CM

## 2017-11-26 DIAGNOSIS — Z09 Encounter for follow-up examination after completed treatment for conditions other than malignant neoplasm: Secondary | ICD-10-CM

## 2017-11-26 MED ORDER — ALBUTEROL SULFATE (2.5 MG/3ML) 0.083% IN NEBU: 2.5000 mg | INHALATION_SOLUTION | Freq: Four times a day (QID) | RESPIRATORY_TRACT | 11 refills | Status: DC | PRN

## 2017-11-26 MED ORDER — IBUPROFEN 800 MG PO TABS: 800.0000 mg | ORAL_TABLET | Freq: Three times a day (TID) | ORAL | 3 refills | Status: DC | PRN

## 2017-11-26 MED ORDER — HYDROCHLOROTHIAZIDE 25 MG PO TABS: 25.0000 mg | ORAL_TABLET | Freq: Every day | ORAL | 3 refills | Status: DC

## 2017-11-26 MED ORDER — FLUOXETINE HCL 10 MG PO TABS: 10.0000 mg | ORAL_TABLET | Freq: Every day | ORAL | 3 refills | Status: DC

## 2017-11-26 MED ORDER — GUAIFENESIN ER 600 MG PO TB12: 600.0000 mg | ORAL_TABLET | Freq: Two times a day (BID) | ORAL | 2 refills | Status: DC

## 2017-11-26 MED ORDER — LISINOPRIL 10 MG PO TABS: 30.0000 mg | ORAL_TABLET | Freq: Every day | ORAL | 3 refills | Status: DC

## 2017-11-26 MED ORDER — HYDROCODONE-HOMATROPINE 5-1.5 MG/5ML PO SYRP: 5.0000 mL | ORAL_SOLUTION | Freq: Three times a day (TID) | ORAL | 0 refills | Status: DC | PRN

## 2017-11-26 NOTE — Progress Notes (Signed)
Hospital Follow Up  Subjective:    Patient ID: Catherine Kane, female    DOB: 07/09/1970, 47 y.o.   MRN: [id number]   Chief Complaint  Patient presents with  . Paper work    Asthma   HPI  [name] is a 47 year old female with a past medical Hypertensin, Bronchitis, and Asthma. She is here today for a Hospital Follow Up and assessment and management of her chronic diseases.    Current Status: Since her last office visit, she has had a recent ED visit on 11/11/2017 for an upper respiratory tract infection. she was discharged with Benzonatate for cough. She states that she continues to have chest congestion and cough, which Benzonatate was not effective. No chest pain, heart palpitations, cough and shortness of breath reported.   She denies fevers, chills, fatigue, recent infections, weight loss, and night sweats. She has not had any headaches, visual changes, dizziness, and falls.No reports of GI problems such as nausea, vomiting, diarrhea, and constipation. She has no reports of blood in stools, dysuria and hematuria. No depression or anxiety reported. She denies pain today.   Past Medical History:  Diagnosis Date  . Asthma   . Bronchitis   . Hypertension     Family History  Problem Relation Age of Onset  . Heart disease Mother   . Heart disease Sister     Social History   Socioeconomic History  . Marital status: Single    Spouse name: Not on file  . Number of children: Not on file  . Years of education: Not on file  . Highest education level: Not on file  Occupational History  . Not on file  Social Needs  . Financial resource strain: Not on file  . Food insecurity:    Worry: Not on file    Inability: Not on file  . Transportation needs:    Medical: Not on file    Non-medical: Not on file  Tobacco Use  . Smoking status: Never Smoker  . Smokeless tobacco: Never Used  Substance and Sexual Activity  . Alcohol use: No  . Drug use: No  . Sexual activity: Yes   Lifestyle  . Physical activity:    Days per week: Not on file    Minutes per session: Not on file  . Stress: Not on file  Relationships  . Social connections:    Talks on phone: Not on file    Gets together: Not on file    Attends religious service: Not on file    Active member of club or organization: Not on file    Attends meetings of clubs or organizations: Not on file    Relationship status: Not on file  . Intimate partner violence:    Fear of current or ex partner: Not on file    Emotionally abused: Not on file    Physically abused: Not on file    Forced sexual activity: Not on file  Other Topics Concern  . Not on file  Social History Narrative  . Not on file    Past Surgical History:  Procedure Laterality Date  . TONSILLECTOMY      Immunization History  Administered Date(s) Administered  . Influenza,inj,Quad PF,6+ Mos 04/16/2017  . Tdap 07/07/2014    Current Meds  Medication Sig  . acyclovir (ZOVIRAX) 200 MG capsule Take 1 capsule (200 mg total) by mouth 2 (two) times daily.  Marland Kitchen albuterol (PROVENTIL HFA;VENTOLIN HFA) 108 (90 Base) MCG/ACT inhaler Inhale  2 puffs into the lungs every 6 (six) hours as needed for wheezing or shortness of breath.  Marland Kitchen FLUoxetine (PROZAC) 10 MG tablet Take 1 tablet (10 mg total) by mouth daily.  Marland Kitchen gabapentin (NEURONTIN) 300 MG capsule Take 1 capsule (300 mg total) by mouth 3 (three) times daily.  . hydrochlorothiazide (HYDRODIURIL) 25 MG tablet Take 1 tablet (25 mg total) by mouth daily.  Marland Kitchen ibuprofen (ADVIL,MOTRIN) 800 MG tablet Take 1 tablet (800 mg total) by mouth every 8 (eight) hours as needed for mild pain or moderate pain.  . pantoprazole (PROTONIX) 20 MG tablet Take 1 tablet (20 mg total) by mouth daily.  . traMADol (ULTRAM) 50 MG tablet Take 1 tablet (50 mg total) by mouth every 6 (six) hours as needed.  . [DISCONTINUED] FLUoxetine (PROZAC) 10 MG tablet Take 1 tablet (10 mg total) by mouth daily.  . [DISCONTINUED]  hydrochlorothiazide (HYDRODIURIL) 25 MG tablet Take 1 tablet (25 mg total) by mouth daily.  . [DISCONTINUED] ibuprofen (ADVIL,MOTRIN) 800 MG tablet Take 1 tablet (800 mg total) by mouth every 8 (eight) hours as needed for mild pain or moderate pain.  . [DISCONTINUED] ibuprofen (ADVIL,MOTRIN) 800 MG tablet Take 1 tablet (800 mg total) by mouth every 8 (eight) hours as needed for mild pain or moderate pain.   No Known Allergies  BP (!) 142/98 (BP Location: Left Arm, Patient Position: Sitting, Cuff Size: Large)   Pulse 76   Temp 98.2 F (36.8 C) (Oral)   Ht 5\' 4"  (1.626 m)   Wt 198 lb (89.8 kg)   LMP 10/30/2017   SpO2 100%   BMI 33.99 kg/m    Review of Systems  Constitutional: Negative.   HENT: Positive for congestion (chest ).   Eyes: Negative.   Respiratory: Positive for cough (frequent ).   Cardiovascular: Negative.   Gastrointestinal: Positive for abdominal distention (Obese).  Genitourinary: Negative.   Musculoskeletal: Negative.   Skin: Negative.   Neurological: Negative.   Psychiatric/Behavioral: Negative.    Objective:   Physical Exam  Constitutional: She is oriented to person, place, and time. She appears well-developed and well-nourished.  HENT:  Head: Normocephalic and atraumatic.  Right Ear: External ear normal.  Left Ear: External ear normal.  Nose: Nose normal.  Mouth/Throat: Oropharynx is clear and moist.  Eyes: Pupils are equal, round, and reactive to light. Conjunctivae and EOM are normal.  Neck: Normal range of motion. Neck supple.  Cardiovascular: Normal rate, regular rhythm, normal heart sounds and intact distal pulses.  Pulmonary/Chest: Effort normal and breath sounds normal.  Abdominal: Soft. Bowel sounds are normal. She exhibits distension (Obese).  Musculoskeletal: Normal range of motion.  Neurological: She is alert and oriented to person, place, and time.  Skin: Skin is warm and dry.  Psychiatric: She has a normal mood and affect. Her behavior is  normal. Judgment and thought content normal.  Nursing note and vitals reviewed.  Assessment & Plan:   1. Chest congestion Stable. Continue Mucinex today.   2. Cough We will initiate Hycodan cough syrup today. Monitor.   3. Shortness of breath Stable. Occasional on exertion. Monitor.   4. Essential hypertension Blood pressure is stable today. We will assess adding an additional antihypertensive medication at her next office visit. She will continue to decrease high sodium intake, excessive alcohol intake, increase potassium intake, smoking cessation, and increase physical activity of at least 30 minutes of cardio activity daily. She will continue to follow Heart Healthy or DASH diet.  5. Sciatic  pain, right Refill - ibuprofen (ADVIL,MOTRIN) 800 MG tablet; Take 1 tablet (800 mg total) by mouth every 8 (eight) hours as needed for mild pain or moderate pain.  Dispense: 30 tablet; Refill: 3  6. Follow up He will follow up in 1 month.   Meds ordered this encounter  Medications  . FLUoxetine (PROZAC) 10 MG tablet    Sig: Take 1 tablet (10 mg total) by mouth daily.    Dispense:  30 tablet    Refill:  3  . hydrochlorothiazide (HYDRODIURIL) 25 MG tablet    Sig: Take 1 tablet (25 mg total) by mouth daily.    Dispense:  30 tablet    Refill:  3  . DISCONTD: ibuprofen (ADVIL,MOTRIN) 800 MG tablet    Sig: Take 1 tablet (800 mg total) by mouth every 8 (eight) hours as needed for mild pain or moderate pain.    Dispense:  30 tablet    Refill:  3  . lisinopril (PRINIVIL,ZESTRIL) 10 MG tablet    Sig: Take 3 tablets (30 mg total) by mouth daily.    Dispense:  90 tablet    Refill:  3  . guaiFENesin (MUCINEX) 600 MG 12 hr tablet    Sig: Take 1 tablet (600 mg total) by mouth 2 (two) times daily.    Dispense:  60 tablet    Refill:  2  . HYDROcodone-homatropine (HYCODAN) 5-1.5 MG/5ML syrup    Sig: Take 5 mLs by mouth every 8 (eight) hours as needed for cough.    Dispense:  120 mL    Refill:  0   . albuterol (PROVENTIL) (2.5 MG/3ML) 0.083% nebulizer solution    Sig: Take 3 mLs (2.5 mg total) by nebulization every 6 (six) hours as needed for wheezing or shortness of breath.    Dispense:  150 mL    Refill:  11  . ibuprofen (ADVIL,MOTRIN) 800 MG tablet    Sig: Take 1 tablet (800 mg total) by mouth every 8 (eight) hours as needed for mild pain or moderate pain.    Dispense:  30 tablet    Refill:  3    Raliegh Ip,  MSN, FNP-C Patient [facility] [address] [place], [place] [address] [phone]

## 2017-11-26 NOTE — Patient Instructions (Signed)
 Guaifenesin oral ER tablets What is this medicine? GUAIFENESIN (gwye FEN e sin) is an expectorant. It helps to thin mucous and make coughs more productive. This medicine is used to treat coughs caused by colds or the flu. It is not intended to treat chronic cough caused by smoking, asthma, emphysema, or heart failure. This medicine may be used for other purposes; ask your health care provider or pharmacist if you have questions. COMMON BRAND NAME(S): Humibid, Mucinex What should I tell my health care provider before I take this medicine? They need to know if you have any of these conditions: -fever -kidney disease -an unusual or allergic reaction to guaifenesin, other medicines, foods, dyes, or preservatives -pregnant or trying to get pregnant -breast-feeding How should I use this medicine? Take this medicine by mouth with a full glass of water. Follow the directions on the prescription label. Do not break, chew or crush this medicine. You may take with food or on an empty stomach. Take your medicine at regular intervals. Do not take your medicine more often than directed. Talk to your pediatrician regarding the use of this medicine in children. While this drug may be prescribed for children as young as 12 years old for selected conditions, precautions do apply. Overdosage: If you think you have taken too much of this medicine contact a poison control center or emergency room at once. NOTE: This medicine is only for you. Do not share this medicine with others. What if I miss a dose? If you miss a dose, take it as soon as you can. If it is almost time for your next dose, take only that dose. Do not take double or extra doses. What may interact with this medicine? Interactions are not expected. This list may not describe all possible interactions. Give your health care provider a list of all the medicines, herbs, non-prescription drugs, or dietary supplements you use. Also tell them if you  smoke, drink alcohol, or use illegal drugs. Some items may interact with your medicine. What should I watch for while using this medicine? Do not treat a cough for more than 1 week without consulting your doctor or health care professional. If you also have a high fever, skin rash, continuing headache, or sore throat, see your doctor. For best results, drink 6 to 8 glasses water daily while you are taking this medicine. What side effects may I notice from receiving this medicine? Side effects that you should report to your doctor or health care professional as soon as possible: -allergic reactions like skin rash, itching or hives, swelling of the face, lips, or tongue Side effects that usually do not require medical attention (report to your doctor or health care professional if they continue or are bothersome): -dizziness -headache -stomach upset This list may not describe all possible side effects. Call your doctor for medical advice about side effects. You may report side effects to FDA at 1-800-FDA-1088. Where should I keep my medicine? Keep out of the reach of children. Store at room temperature between 20 and 25 degrees C (68 and 77 degrees F). Keep container tightly closed. Throw away any unused medicine after the expiration date. NOTE: This sheet is a summary. It may not cover all possible information. If you have questions about this medicine, talk to your doctor, pharmacist, or health care provider.  2018 Elsevier/Gold Standard (2007-06-05 12:14:14)       Homatropine; Hydrocodone oral syrup What is this medicine? HYDROCODONE (hye droe KOE done) is used   to help relieve cough. This medicine may be used for other purposes; ask your health care provider or pharmacist if you have questions. COMMON BRAND NAME(S): Hycodan, Hydromet, Hydropane, Mycodone What should I tell my health care provider before I take this medicine? They need to know if you have any of these  conditions: -brain tumor -drug abuse or addiction -head injury -heart disease -if you frequently drink alcohol-containing drinks -kidney disease -liver disease -lung disease, asthma, or breathing problems -mental problems -an allergic reaction to hydrocodone, other opioid analgesics, other medicines, foods, dyes, or preservatives -pregnant or trying to get pregnant -breast-feeding How should I use this medicine? Take this medicine by mouth. Follow the directions on the prescription label. Use a specially marked spoon or container to measure each dose. Ask your pharmacist if you do not have one. Household spoons are not accurate. You can take it with or without food. If it upsets your stomach, take it with food. Take your medicine at regular intervals. Do not take it more often than directed. A special MedGuide will be given to you by the pharmacist with each prescription and refill. Be sure to read this information carefully each time. Talk to your pediatrician regarding the use of this medicine in children. This medicine is not approved for use in children. Overdosage: If you think you have taken too much of this medicine contact a poison control center or emergency room at once. NOTE: This medicine is only for you. Do not share this medicine with others. What if I miss a dose? If you miss a dose, take it as soon as you can. If it is almost time for your next dose, take only that dose. Do not take double or extra doses. What may interact with this medicine? Do not take this medicine with any of the following medications: -alcohol -antihistamines for allergy, cough and cold -certain medicines for anxiety or sleep -certain medicines for depression like amitriptyline, fluoxetine, sertraline -certain medicines for seizures like carbamazepine, phenobarbital, phenytoin, primidone -general anesthetics like halothane, isoflurane, methoxyflurane, propofol -local anesthetics like lidocaine,  pramoxine, tetracaine -MAOIs like Carbex, Eldepryl, Marplan, Nardil, and Parnate -other narcotic medicines (opiates) for pain or cough -phenothiazines like chlorpromazine, mesoridazine, prochlorperazine, thioridazine This medicine may also interact with the following medications: -antiviral medicines for HIV and AIDS -atropine -certain antibiotics like clarithromycin, erythromycin -certain medicines for bladder problems like oxybutynin, tolterodine -certain medicines for fungal infections like ketoconazole and itraconazole -certain medicines for Parkinson's disease like benztropine, trihexyphenidyl -certain medicines for stomach problems like dicyclomine, hyoscyamine -certain medicines for travel sickness like scopolamine -ipratropium -rifampin This list may not describe all possible interactions. Give your health care provider a list of all the medicines, herbs, non-prescription drugs, or dietary supplements you use. Also tell them if you smoke, drink alcohol, or use illegal drugs. Some items may interact with your medicine. What should I watch for while using this medicine? Use exactly as directed by your doctor or health care professional. Do not take more than the recommended dose. You may develop tolerance to this medicine if you take it for a long time. Tolerance means that you will get less cough relief with time. Tell your doctor or health care professional if your symptoms do not improve or if they get worse. If you have been taking this medicine for a long time, do not suddenly stop taking it because you may develop a severe reaction. Your body becomes used to the medicine. This does NOT mean you are addicted.   Addiction is a behavior related to getting and using a drug for a nonmedical reason. If your doctor wants you to stop the medicine, the dose will be slowly lowered over time to avoid any side effects. There are different types of narcotic medicines (opiates). If you take more than  one type at the same time or if you are taking another medicine that also causes drowsiness, you may have more side effects. Give your health care provider a list of all medicines you use. Your doctor will tell you how much medicine to take. Do not take more medicine than directed. Call emergency for help if you have problems breathing or unusual sleepiness. You may get drowsy or dizzy. Do not drive, use machinery, or do anything that needs mental alertness until you know how this medicine affects you. Do not stand or sit up quickly, especially if you are an older patient. This reduces the risk of dizzy or fainting spells. Alcohol may interfere with the effect of this medicine. Avoid alcoholic drinks. This medicine will cause constipation. Try to have a bowel movement at least every 2 to 3 days. If you do not have a bowel movement for 3 days, call your doctor or health care professional. Your mouth may get dry. Chewing sugarless gum or sucking hard candy, and drinking plenty of water may help. Contact your doctor if the problem does not go away or is severe. What side effects may I notice from receiving this medicine? Side effects that you should report to your doctor or health care professional as soon as possible: -allergic reactions like skin rash, itching or hives, swelling of the face, lips, or tongue -breathing problems -confusion -signs and symptoms of low blood pressure like dizziness; feeling faint or lightheaded, falls; unusually weak or tired -trouble passing urine or change in the amount of urine Side effects that usually do not require medical attention (report to your doctor or health care professional if they continue or are bothersome): -constipation -dry mouth -nausea, vomiting -tiredness This list may not describe all possible side effects. Call your doctor for medical advice about side effects. You may report side effects to FDA at 1-800-FDA-1088. Where should I keep my  medicine? Keep out of the reach of children. This medicine can be abused. Keep your medicine in a safe place to protect it from theft. Do not share this medicine with anyone. Selling or giving away this medicine is dangerous and against the law. This medicine may cause accidental overdose and death if taken by other adults, children, or pets. Mix any unused medicine with a substance like cat littler or coffee grounds. Then throw the medicine away in a sealed container like a sealed bag or a coffee can with a lid. Do not use the medicine after the expiration date. Store at room temperature between 15 and 30 degrees C (59 and 86 degrees F). Protect from light. NOTE: This sheet is a summary. It may not cover all possible information. If you have questions about this medicine, talk to your doctor, pharmacist, or health care provider.  2018 Elsevier/Gold Standard (2016-02-17 23:16:08)  

## 2017-11-28 ENCOUNTER — Other Ambulatory Visit: Payer: Self-pay

## 2017-11-28 MED ORDER — HYDROCODONE-HOMATROPINE 5-1.5 MG/5ML PO SYRP: 5.0000 mL | ORAL_SOLUTION | Freq: Three times a day (TID) | ORAL | 0 refills | Status: DC | PRN

## 2017-11-28 NOTE — Telephone Encounter (Signed)
Patient needs medication sent to [facility].

## 2017-12-11 MED FILL — IBUPROFEN 800 MG TABLET: 800 | 10 days supply | Qty: 30 | Fill #0

## 2017-12-11 MED FILL — LISINOPRIL 10 MG TABS: 10 | 30 days supply | Qty: 90 | Fill #0

## 2017-12-11 MED FILL — FLUoxetine HCL 10 MG CAPS: 10 | 30 days supply | Qty: 30 | Fill #0

## 2017-12-11 MED FILL — HYDROCHLOROTHIAZIDE 25 MG T: 25 | 30 days supply | Qty: 30 | Fill #0

## 2017-12-11 MED FILL — ALBUTEROL SUL 2.5 MG/3 ML S: (2.5 MG/3ML | 12 days supply | Qty: 150 | Fill #0

## 2017-12-13 MED FILL — GABAPENTIN 300 MG CAPSULE: 300 | 30 days supply | Qty: 90 | Fill #1

## 2017-12-28 ENCOUNTER — Ambulatory Visit: Payer: Self-pay | Admitting: Family Medicine

## 2018-01-01 ENCOUNTER — Encounter: Payer: Self-pay | Admitting: Family Medicine

## 2018-01-01 ENCOUNTER — Ambulatory Visit (INDEPENDENT_AMBULATORY_CARE_PROVIDER_SITE_OTHER): Payer: Self-pay | Admitting: Family Medicine

## 2018-01-01 VITALS — BP 144/80 | HR 60 | Temp 98.6°F | Ht 64.0 in | Wt 198.0 lb

## 2018-01-01 DIAGNOSIS — M62838 Other muscle spasm: Secondary | ICD-10-CM

## 2018-01-01 DIAGNOSIS — M549 Dorsalgia, unspecified: Secondary | ICD-10-CM

## 2018-01-01 DIAGNOSIS — M5431 Sciatica, right side: Secondary | ICD-10-CM

## 2018-01-01 DIAGNOSIS — Z09 Encounter for follow-up examination after completed treatment for conditions other than malignant neoplasm: Secondary | ICD-10-CM

## 2018-01-01 DIAGNOSIS — I10 Essential (primary) hypertension: Secondary | ICD-10-CM

## 2018-01-01 DIAGNOSIS — R0989 Other specified symptoms and signs involving the circulatory and respiratory systems: Secondary | ICD-10-CM

## 2018-01-01 LAB — POCT URINALYSIS DIP (MANUAL ENTRY)
Bilirubin, UA: NEGATIVE
Blood, UA: NEGATIVE
Glucose, UA: NEGATIVE mg/dL
Ketones, POC UA: NEGATIVE mg/dL
Nitrite, UA: NEGATIVE
Protein Ur, POC: NEGATIVE mg/dL
Spec Grav, UA: 1.015 (ref 1.010–1.025)
Urobilinogen, UA: 1 E.U./dL
pH, UA: 8.5 — AB (ref 5.0–8.0)

## 2018-01-01 MED ORDER — METHOCARBAMOL 500 MG PO TABS: 500.0000 mg | ORAL_TABLET | Freq: Two times a day (BID) | ORAL | 2 refills | Status: DC | PRN

## 2018-01-01 MED FILL — METHOCARBAMOL 500 MG TABS: 500 | 30 days supply | Qty: 60 | Fill #0

## 2018-01-01 NOTE — Progress Notes (Signed)
Follow Up  Subjective:    Patient ID: Catherine Kane, female    DOB: Dec 02, 1970, 47 y.o.   MRN: [id number]   No chief complaint on file.   HPI  [name] is a 47 year old female with a past medical history of Hypertension, Bronchitis, and Asthma. She is here today for follow up.   Current Status: Since her last office visit, she is doing well with no complaints.She denies visual changes, chest pain, cough, shortness of breath, heart palpitations, and falls. He has occasionally headaches and dizziness with position changes. Denies severe headaches, confusion, seizures, double vision, and blurred vision, nausea and vomiting.  She denies fevers, chills, fatigue, recent infections, weight loss, and night sweats. No chest pain, heart palpitations, cough and shortness of breath reported. No reports of GI problems such as nausea, vomiting, diarrhea, and constipation. She has no reports of blood in stools, dysuria and hematuria. No depression or anxiety, and denies suicidal ideations, homicidal ideations, or auditory hallucinations. She denies pain today.   Past Medical History:  Diagnosis Date  . Asthma   . Bronchitis   . Hypertension    Family History  Problem Relation Age of Onset  . Heart disease Mother   . Heart disease Sister     Social History   Socioeconomic History  . Marital status: Single    Spouse name: Not on file  . Number of children: Not on file  . Years of education: Not on file  . Highest education level: Not on file  Occupational History  . Not on file  Social Needs  . Financial resource strain: Not on file  . Food insecurity:    Worry: Not on file    Inability: Not on file  . Transportation needs:    Medical: Not on file    Non-medical: Not on file  Tobacco Use  . Smoking status: Never Smoker  . Smokeless tobacco: Never Used  Substance and Sexual Activity  . Alcohol use: No  . Drug use: No  . Sexual activity: Yes  Lifestyle  . Physical  activity:    Days per week: Not on file    Minutes per session: Not on file  . Stress: Not on file  Relationships  . Social connections:    Talks on phone: Not on file    Gets together: Not on file    Attends religious service: Not on file    Active member of club or organization: Not on file    Attends meetings of clubs or organizations: Not on file    Relationship status: Not on file  . Intimate partner violence:    Fear of current or ex partner: Not on file    Emotionally abused: Not on file    Physically abused: Not on file    Forced sexual activity: Not on file  Other Topics Concern  . Not on file  Social History Narrative  . Not on file    Past Surgical History:  Procedure Laterality Date  . TONSILLECTOMY      Immunization History  Administered Date(s) Administered  . Influenza,inj,Quad PF,6+ Mos 04/16/2017  . Tdap 07/07/2014    Current Meds  Medication Sig  . acyclovir (ZOVIRAX) 200 MG capsule Take 1 capsule (200 mg total) by mouth 2 (two) times daily.  Marland Kitchen albuterol (PROVENTIL HFA;VENTOLIN HFA) 108 (90 Base) MCG/ACT inhaler Inhale 2 puffs into the lungs every 6 (six) hours as needed for wheezing or shortness of breath.  Marland Kitchen  albuterol (PROVENTIL) (2.5 MG/3ML) 0.083% nebulizer solution Take 3 mLs (2.5 mg total) by nebulization every 6 (six) hours as needed for wheezing or shortness of breath.  Marland Kitchen. FLUoxetine (PROZAC) 10 MG tablet Take 1 tablet (10 mg total) by mouth daily.  Marland Kitchen. gabapentin (NEURONTIN) 300 MG capsule Take 1 capsule (300 mg total) by mouth 3 (three) times daily.  . hydrochlorothiazide (HYDRODIURIL) 25 MG tablet Take 1 tablet (25 mg total) by mouth daily.  Marland Kitchen. ibuprofen (ADVIL,MOTRIN) 800 MG tablet Take 1 tablet (800 mg total) by mouth every 8 (eight) hours as needed for mild pain or moderate pain.  Marland Kitchen. lisinopril (PRINIVIL,ZESTRIL) 10 MG tablet Take 3 tablets (30 mg total) by mouth daily.  . pantoprazole (PROTONIX) 20 MG tablet Take 1 tablet (20 mg total) by mouth  daily.    No Known Allergies  BP (!) 144/80   Pulse 60   Temp 98.6 F (37 C) (Oral)   Ht 5\' 4"  (1.626 m)   Wt 198 lb (89.8 kg)   LMP 12/26/2017   SpO2 100%   BMI 33.99 kg/m    Review of Systems  Constitutional: Negative.   HENT: Negative.   Eyes: Negative.   Respiratory: Negative.   Cardiovascular: Negative.   Gastrointestinal: Negative.   Endocrine: Negative.   Genitourinary: Negative.   Musculoskeletal: Negative.   Skin: Negative.   Allergic/Immunologic: Negative.   Neurological: Positive for dizziness and headaches.  Hematological: Negative.   Psychiatric/Behavioral: Negative.    Objective:   Physical Exam  Constitutional: She is oriented to person, place, and time. She appears well-developed and well-nourished.  HENT:  Head: Normocephalic and atraumatic.  Eyes: Pupils are equal, round, and reactive to light. EOM are normal.  Neck: Normal range of motion. Neck supple.  Cardiovascular: Normal rate, regular rhythm, normal heart sounds and intact distal pulses.  Pulmonary/Chest: Effort normal and breath sounds normal.  Abdominal: Soft. Bowel sounds are normal.  Neurological: She is oriented to person, place, and time.  Skin: Skin is warm and dry.  Psychiatric: She has a normal mood and affect. Her behavior is normal. Judgment and thought content normal.  Nursing note and vitals reviewed.  Assessment & Plan:   1. Essential hypertension Blood pressure is mildly elevated at 144/80 today. Continue Lisinopril as prescribed. She will continue to decrease high sodium intake, excessive alcohol intake, increase potassium intake, smoking cessation, and increase physical activity of at least 30 minutes of cardio activity daily. She will continue to follow Heart Healthy or DASH diet. - POCT urinalysis dipstick  2. Muscle spasm - methocarbamol (ROBAXIN) 500 MG tablet; Take 1 tablet (500 mg total) by mouth 2 (two) times daily as needed for muscle spasms.  Dispense: 60 tablet;  Refill: 2  3. Chest congestion Stable. Not worsening. Continue OTC medications as needed.   4. Sciatic pain, right Continue pain medication as prescribed.   5. Back pain, unspecified back location, unspecified back pain laterality, unspecified chronicity Continue Motrin as needed for back pain.   6. Follow up She will follow up in 3 months.   Meds ordered this encounter  Medications  . methocarbamol (ROBAXIN) 500 MG tablet    Sig: Take 1 tablet (500 mg total) by mouth 2 (two) times daily as needed for muscle spasms.    Dispense:  60 tablet    Refill:  2    Raliegh IpNatalie Ivo Moga,  MSN, FNP-C [facility] [address]  [place], [place] [id number] [phone]

## 2018-01-01 NOTE — Patient Instructions (Signed)
Methocarbamol tablets What is this medicine? METHOCARBAMOL (meth oh KAR ba mole) helps to relieve pain and stiffness in muscles caused by strains, sprains, or other injury to your muscles. This medicine may be used for other purposes; ask your health care provider or pharmacist if you have questions. COMMON BRAND NAME(S): Robaxin What should I tell my health care provider before I take this medicine? They need to know if you have any of these conditions: -kidney disease -seizures -an unusual or allergic reaction to methocarbamol, other medicines, foods, dyes, or preservatives -pregnant or trying to get pregnant -breast-feeding How should I use this medicine? Take this medicine by mouth with a full glass of water. Follow the directions on the prescription label. Take your medicine at regular intervals. Do not take your medicine more often than directed. Talk to your pediatrician regarding the use of this medicine in children. Special care may be needed. Overdosage: If you think you have taken too much of this medicine contact a poison control center or emergency room at once. NOTE: This medicine is only for you. Do not share this medicine with others. What if I miss a dose? If you miss a dose, take it as soon as you can. If it is almost time for your next dose, take only the next dose. Do not take double or extra doses. What may interact with this medicine? Do not take this medication with any of the following medicines: -narcotic medicines for cough This medicine may also interact with the following medications: -alcohol -antihistamines for allergy, cough and cold -certain medicines for anxiety or sleep -certain medicines for depression like amitriptyline, fluoxetine, sertraline -certain medicines for seizures like phenobarbital, primidone -cholinesterase inhibitors like neostigmine, ambenonium, and pyridostigmine bromide -general anesthetics like halothane, isoflurane, methoxyflurane,  propofol -local anesthetics like lidocaine, pramoxine, tetracaine -medicines that relax muscles for surgery -narcotic medicines for pain -phenothiazines like chlorpromazine, mesoridazine, prochlorperazine, thioridazine This list may not describe all possible interactions. Give your health care provider a list of all the medicines, herbs, non-prescription drugs, or dietary supplements you use. Also tell them if you smoke, drink alcohol, or use illegal drugs. Some items may interact with your medicine. What should I watch for while using this medicine? Tell your doctor or health care professional if your symptoms do not start to get better or if they get worse. You may get drowsy or dizzy. Do not drive, use machinery, or do anything that needs mental alertness until you know how this medicine affects you. Do not stand or sit up quickly, especially if you are an older patient. This reduces the risk of dizzy or fainting spells. Alcohol may interfere with the effect of this medicine. Avoid alcoholic drinks. If you are taking another medicine that also causes drowsiness, you may have more side effects. Give your health care provider a list of all medicines you use. Your doctor will tell you how much medicine to take. Do not take more medicine than directed. Call emergency for help if you have problems breathing or unusual sleepiness. What side effects may I notice from receiving this medicine? Side effects that you should report to your doctor or health care professional as soon as possible: -allergic reactions like skin rash, itching or hives, swelling of the face, lips, or tongue -breathing problems -confusion -seizures -unusually weak or tired Side effects that usually do not require medical attention (report to your doctor or health care professional if they continue or are bothersome): -dizziness -headache -metallic taste -tiredness -upset   stomach This list may not describe all possible side  effects. Call your doctor for medical advice about side effects. You may report side effects to FDA at 1-800-FDA-1088. Where should I keep my medicine? Keep out of the reach of children. Store at room temperature between 20 and 25 degrees C (68 and 77 degrees F). Keep container tightly closed. Throw away any unused medicine after the expiration date. NOTE: This sheet is a summary. It may not cover all possible information. If you have questions about this medicine, talk to your doctor, pharmacist, or health care provider.  2018 Elsevier/Gold Standard (2014-11-03 13:11:54)  

## 2018-03-07 ENCOUNTER — Other Ambulatory Visit: Payer: Self-pay

## 2018-03-07 DIAGNOSIS — M5431 Sciatica, right side: Secondary | ICD-10-CM

## 2018-03-07 DIAGNOSIS — M62838 Other muscle spasm: Secondary | ICD-10-CM

## 2018-03-07 DIAGNOSIS — M549 Dorsalgia, unspecified: Secondary | ICD-10-CM

## 2018-03-07 DIAGNOSIS — K219 Gastro-esophageal reflux disease without esophagitis: Secondary | ICD-10-CM

## 2018-03-07 MED ORDER — PANTOPRAZOLE SODIUM 20 MG PO TBEC: 20.0000 mg | DELAYED_RELEASE_TABLET | Freq: Every day | ORAL | 3 refills | Status: DC

## 2018-03-07 MED ORDER — FLUOXETINE HCL 10 MG PO TABS: 10.0000 mg | ORAL_TABLET | Freq: Every day | ORAL | 3 refills | Status: DC

## 2018-03-07 MED ORDER — GABAPENTIN 300 MG PO CAPS: 300.0000 mg | ORAL_CAPSULE | Freq: Three times a day (TID) | ORAL | 3 refills | Status: DC

## 2018-03-07 MED ORDER — HYDROCHLOROTHIAZIDE 25 MG PO TABS: 25.0000 mg | ORAL_TABLET | Freq: Every day | ORAL | 3 refills | Status: DC

## 2018-03-07 MED ORDER — LISINOPRIL 10 MG PO TABS: 30.0000 mg | ORAL_TABLET | Freq: Every day | ORAL | 3 refills | Status: DC

## 2018-03-07 MED ORDER — METHOCARBAMOL 500 MG PO TABS: 500.0000 mg | ORAL_TABLET | Freq: Two times a day (BID) | ORAL | 2 refills | Status: DC | PRN

## 2018-03-07 MED FILL — FLUoxetine HCL 10 MG CAPS: 10 | 30 days supply | Qty: 30 | Fill #0

## 2018-03-07 MED FILL — HYDROCHLOROTHIAZIDE 25 MG T: 25 | 30 days supply | Qty: 30 | Fill #0

## 2018-03-07 MED FILL — PANTOPRAZOLE SOD DR 20 MG T: 20 | 30 days supply | Qty: 30 | Fill #0

## 2018-03-07 MED FILL — METHOCARBAMOL 500 MG TABS: 500 | 30 days supply | Qty: 60 | Fill #0

## 2018-03-07 MED FILL — LISINOPRIL 10 MG TABS: 10 | 30 days supply | Qty: 90 | Fill #0

## 2018-03-07 MED FILL — GABAPENTIN 300 MG CAPSULE: 300 | 30 days supply | Qty: 90 | Fill #0

## 2018-03-07 NOTE — Telephone Encounter (Signed)
Medication sent to pharmacy  

## 2018-03-13 ENCOUNTER — Other Ambulatory Visit: Payer: Self-pay

## 2018-03-13 DIAGNOSIS — M5431 Sciatica, right side: Secondary | ICD-10-CM

## 2018-03-13 MED ORDER — IBUPROFEN 800 MG PO TABS: 800.0000 mg | ORAL_TABLET | Freq: Three times a day (TID) | ORAL | 3 refills | Status: DC | PRN

## 2018-03-13 MED FILL — IBUPROFEN 800 MG TABS: 800 | 10 days supply | Qty: 30 | Fill #0

## 2018-03-13 NOTE — Telephone Encounter (Signed)
Medication sent to new pharmacy

## 2018-04-05 ENCOUNTER — Ambulatory Visit: Payer: Self-pay | Admitting: Family Medicine

## 2018-04-08 MED FILL — IBUPROFEN 800 MG TABS: 800 | 10 days supply | Qty: 30 | Fill #1 | Status: TO

## 2018-05-01 ENCOUNTER — Other Ambulatory Visit: Payer: Self-pay | Admitting: Family Medicine

## 2018-05-01 ENCOUNTER — Telehealth: Payer: Self-pay

## 2018-05-01 NOTE — Telephone Encounter (Signed)
Patient has been out of work all week on [employer] and she needs to be cleared before she can go back to work. Patient would like a note to say either to say she is okay to return to work or that she is not able to return at this time because of her asthma.

## 2018-05-01 NOTE — Telephone Encounter (Signed)
Tried to contact patient and number keeps ringing busy 

## 2018-05-01 NOTE — Telephone Encounter (Signed)
Tried to contact patient 3 times and phone keeps ringing busy

## 2018-05-02 NOTE — Telephone Encounter (Signed)
Tried to contact patient and phone keep ringing busy

## 2018-05-02 NOTE — Telephone Encounter (Signed)
Patient schedule

## 2018-05-03 ENCOUNTER — Other Ambulatory Visit: Payer: Self-pay

## 2018-05-03 ENCOUNTER — Encounter: Payer: Self-pay | Admitting: Family Medicine

## 2018-05-03 ENCOUNTER — Ambulatory Visit (INDEPENDENT_AMBULATORY_CARE_PROVIDER_SITE_OTHER): Payer: Self-pay | Admitting: Family Medicine

## 2018-05-03 DIAGNOSIS — R05 Cough: Secondary | ICD-10-CM

## 2018-05-03 DIAGNOSIS — R0602 Shortness of breath: Secondary | ICD-10-CM | POA: Insufficient documentation

## 2018-05-03 DIAGNOSIS — R059 Cough, unspecified: Secondary | ICD-10-CM | POA: Insufficient documentation

## 2018-05-03 DIAGNOSIS — J449 Chronic obstructive pulmonary disease, unspecified: Secondary | ICD-10-CM

## 2018-05-03 MED ORDER — MONTELUKAST SODIUM 10 MG PO TABS
10.0000 mg | ORAL_TABLET | Freq: Every day | ORAL | 3 refills | Status: DC
Start: 2018-05-03 — End: 2018-09-30

## 2018-05-03 MED ORDER — FLUTICASONE PROPIONATE 50 MCG/ACT NA SUSP: 2.0000 | Freq: Every day | NASAL | 6 refills | Status: DC

## 2018-05-03 MED ORDER — CETIRIZINE HCL 10 MG PO TABS: 10.0000 mg | ORAL_TABLET | Freq: Every day | ORAL | 11 refills | Status: DC

## 2018-05-03 NOTE — Progress Notes (Signed)
Virtual Visit via Telephone Note  I connected with Catherine Kane on 05/03/18 at  1:40 PM EDT by telephone and verified that I am speaking with the correct person using two identifiers.   I discussed the limitations, risks, security and privacy concerns of performing an evaluation and management service by telephone and the availability of in person appointments. I also discussed with the patient that there may be a patient responsible charge related to this service. The patient expressed understanding and agreed to proceed.   History of Present Illness:  Past Medical History:  Diagnosis Date  . Asthma   . Bronchitis   . Hypertension    Current Outpatient Medications on File Prior to Visit  Medication Sig Dispense Refill  . albuterol (PROVENTIL HFA;VENTOLIN HFA) 108 (90 Base) MCG/ACT inhaler Inhale 2 puffs into the lungs every 6 (six) hours as needed for wheezing or shortness of breath. 1 Inhaler 11  . albuterol (PROVENTIL) (2.5 MG/3ML) 0.083% nebulizer solution Take 3 mLs (2.5 mg total) by nebulization every 6 (six) hours as needed for wheezing or shortness of breath. 150 mL 11  . FLUoxetine (PROZAC) 10 MG tablet Take 1 tablet (10 mg total) by mouth daily. 30 tablet 3  . gabapentin (NEURONTIN) 300 MG capsule Take 1 capsule (300 mg total) by mouth 3 (three) times daily. 90 capsule 3  . hydrochlorothiazide (HYDRODIURIL) 25 MG tablet Take 1 tablet (25 mg total) by mouth daily. 30 tablet 3  . HYDROcodone-homatropine (HYCODAN) 5-1.5 MG/5ML syrup Take 5 mLs by mouth every 8 (eight) hours as needed for cough. 120 mL 0  . ibuprofen (ADVIL,MOTRIN) 800 MG tablet Take 1 tablet (800 mg total) by mouth every 8 (eight) hours as needed for mild pain or moderate pain. 30 tablet 3  . lisinopril (PRINIVIL,ZESTRIL) 10 MG tablet Take 3 tablets (30 mg total) by mouth daily. 90 tablet 3  . methocarbamol (ROBAXIN) 500 MG tablet Take 1 tablet (500 mg total) by mouth 2 (two) times daily as needed for  muscle spasms. 60 tablet 2  . pantoprazole (PROTONIX) 20 MG tablet Take 1 tablet (20 mg total) by mouth daily. 30 tablet 3  . acyclovir (ZOVIRAX) 200 MG capsule Take 1 capsule (200 mg total) by mouth 2 (two) times daily. 20 capsule 1   No current facility-administered medications on file prior to visit.    Current Status: Since her last office visit, she is doing well with no complaints. She has been having increased shortness of breath and cough. She states that she has been using her inhalers and nebulizer as prescribed. She is concerned about continuing to work with her risks of contracting COVI-19 virus.   She denies fevers, chills, fatigue, recent infections, weight loss, and night sweats. She has not had any headaches, visual changes, dizziness, and falls. No chest pain, heart palpitations, cough and shortness of breath reported. No reports of GI problems such as nausea, vomiting, diarrhea, and constipation. She has no reports of blood in stools, dysuria and hematuria. No depression or anxiety reported.   Observations/Objective:  Telephone Virtual Visit.   Assessment and Plan:  1. Shortness of breath Stable today. No signs or symptoms of respiratory distress reported or noted.  - cetirizine (ZYRTEC) 10 MG tablet; Take 1 tablet (10 mg total) by mouth daily.  Dispense: 30 tablet; Refill: 11 - fluticasone (FLONASE) 50 MCG/ACT nasal spray; Place 2 sprays into both nostrils daily.  Dispense: 16 g; Refill: 6 - montelukast (SINGULAIR) 10 MG tablet; Take 1  tablet (10 mg total) by mouth at bedtime.  Dispense: 30 tablet; Refill: 3  2. Cough Mild. Not worsening.  - cetirizine (ZYRTEC) 10 MG tablet; Take 1 tablet (10 mg total) by mouth daily.  Dispense: 30 tablet; Refill: 11 - fluticasone (FLONASE) 50 MCG/ACT nasal spray; Place 2 sprays into both nostrils daily.  Dispense: 16 g; Refill: 6 - montelukast (SINGULAIR) 10 MG tablet; Take 1 tablet (10 mg total) by mouth at bedtime.  Dispense: 30  tablet; Refill: 3  3. Chronic obstructive pulmonary disease, unspecified COPD type (HCC) We will initiate Zyrtec today.  - cetirizine (ZYRTEC) 10 MG tablet; Take 1 tablet (10 mg total) by mouth daily.  Dispense: 30 tablet; Refill: 11 - fluticasone (FLONASE) 50 MCG/ACT nasal spray; Place 2 sprays into both nostrils daily.  Dispense: 16 g; Refill: 6 - montelukast (SINGULAIR) 10 MG tablet; Take 1 tablet (10 mg total) by mouth at bedtime.  Dispense: 30 tablet; Refill: 3   Follow Up Instructions:  We will schedule her to follow up in 3 months.   Meds ordered this encounter  Medications  . cetirizine (ZYRTEC) 10 MG tablet    Sig: Take 1 tablet (10 mg total) by mouth daily.    Dispense:  30 tablet    Refill:  11  . fluticasone (FLONASE) 50 MCG/ACT nasal spray    Sig: Place 2 sprays into both nostrils daily.    Dispense:  16 g    Refill:  6  . montelukast (SINGULAIR) 10 MG tablet    Sig: Take 1 tablet (10 mg total) by mouth at bedtime.    Dispense:  30 tablet    Refill:  3    No orders of the defined types were placed in this encounter.   Referral Orders  No referral(s) requested today    Raliegh Ip,  MSN, FNP-C [facility] [address] [place], [place] [address] [phone]  I discussed the assessment and treatment plan with the patient. The patient was provided an opportunity to ask questions and all were answered. The patient agreed with the plan and demonstrated an understanding of the instructions.   The patient was advised to call back or seek an in-person evaluation if the symptoms worsen or if the condition fails to improve as anticipated.  I provided 15-20 minutes of non-face-to-face time during this encounter.   Kallie Locks, FNP

## 2018-05-06 ENCOUNTER — Telehealth: Payer: Self-pay

## 2018-05-06 ENCOUNTER — Other Ambulatory Visit: Payer: Self-pay | Admitting: Family Medicine

## 2018-05-06 NOTE — Telephone Encounter (Signed)
Patient states that the note that you wrote for her states that she has diabetes and CHF. Patient is not aware that she has either one of these conditions. Please advise

## 2018-05-08 ENCOUNTER — Other Ambulatory Visit: Payer: Self-pay | Admitting: Family Medicine

## 2018-05-08 MED FILL — MONTELUKAST SOD 10 MG TAB: 10 | 30 days supply | Qty: 30 | Fill #0

## 2018-05-08 MED FILL — CETIRIZINE HCL 10 MG TABS: 10 | 30 days supply | Qty: 30 | Fill #0

## 2018-05-21 ENCOUNTER — Encounter: Payer: Self-pay | Admitting: Family Medicine

## 2018-05-21 ENCOUNTER — Ambulatory Visit (INDEPENDENT_AMBULATORY_CARE_PROVIDER_SITE_OTHER): Payer: Self-pay | Admitting: Family Medicine

## 2018-05-21 ENCOUNTER — Other Ambulatory Visit: Payer: Self-pay

## 2018-05-21 VITALS — BP 146/90 | HR 68 | Temp 98.4°F | Ht 64.0 in | Wt 200.0 lb

## 2018-05-21 DIAGNOSIS — Z09 Encounter for follow-up examination after completed treatment for conditions other than malignant neoplasm: Secondary | ICD-10-CM

## 2018-05-21 DIAGNOSIS — R059 Cough, unspecified: Secondary | ICD-10-CM

## 2018-05-21 DIAGNOSIS — R0989 Other specified symptoms and signs involving the circulatory and respiratory systems: Secondary | ICD-10-CM

## 2018-05-21 DIAGNOSIS — R0602 Shortness of breath: Secondary | ICD-10-CM

## 2018-05-21 DIAGNOSIS — I10 Essential (primary) hypertension: Secondary | ICD-10-CM

## 2018-05-21 DIAGNOSIS — R05 Cough: Secondary | ICD-10-CM

## 2018-05-21 DIAGNOSIS — J449 Chronic obstructive pulmonary disease, unspecified: Secondary | ICD-10-CM

## 2018-05-21 NOTE — Progress Notes (Signed)
[facility]  Sick Visit  Subjective:  Patient ID: Catherine Kane, female    DOB: 1970/08/23  Age: 48 y.o. MRN: [id number]  CC:  Chief Complaint  Patient presents with  . paperwork    FMLA/STD    HPI Catherine Kane is a 48 year old female who presents for a Sick Visit today.   Past Medical History:  Diagnosis Date  . Asthma   . Bronchitis   . Chronic obstructive pulmonary disease (HCC) 05/03/2018  . Hypertension    Current Status: Since her last office visit, she is doing well with no complaints. She has been having difficulty with Asthma flare ups lately. She continues to take prescribed medications as prescribed. She has occasional cough and shortness of breath. She denies fevers, chills, fatigue, recent infections, weight loss, and night sweats. She has not had any headaches, visual changes, dizziness, and falls. No chest pain, and heart palpitations reported. No reports of GI problems such as nausea, vomiting, diarrhea, and constipation. She has no reports of blood in stools, dysuria and hematuria. No depression or anxiety reported. She denies pain today.   Past Surgical History:  Procedure Laterality Date  . TONSILLECTOMY      Family History  Problem Relation Age of Onset  . Heart disease Mother   . Heart disease Sister     Social History   Socioeconomic History  . Marital status: Single    Spouse name: Not on file  . Number of children: Not on file  . Years of education: Not on file  . Highest education level: Not on file  Occupational History  . Not on file  Social Needs  . Financial resource strain: Not on file  . Food insecurity:    Worry: Not on file    Inability: Not on file  . Transportation needs:    Medical: Not on file    Non-medical: Not on file  Tobacco Use  . Smoking status: Never Smoker  . Smokeless tobacco: Never Used  Substance and Sexual Activity  . Alcohol use:  No  . Drug use: No  . Sexual activity: Yes  Lifestyle  . Physical activity:    Days per week: Not on file    Minutes per session: Not on file  . Stress: Not on file  Relationships  . Social connections:    Talks on phone: Not on file    Gets together: Not on file    Attends religious service: Not on file    Active member of club or organization: Not on file    Attends meetings of clubs or organizations: Not on file    Relationship status: Not on file  . Intimate partner violence:    Fear of current or ex partner: Not on file    Emotionally abused: Not on file    Physically abused: Not on file    Forced sexual activity: Not on file  Other Topics Concern  . Not on file  Social History Narrative  . Not on file    Outpatient Medications Prior to Visit  Medication Sig Dispense Refill  . acyclovir (ZOVIRAX) 200 MG capsule Take 1 capsule (200 mg total) by mouth 2 (two) times daily. 20 capsule 1  . albuterol (PROVENTIL HFA;VENTOLIN HFA) 108 (90 Base) MCG/ACT inhaler Inhale 2 puffs into the lungs every 6 (six) hours as needed for wheezing or shortness of breath. 1 Inhaler 11  . albuterol (PROVENTIL) (  2.5 MG/3ML) 0.083% nebulizer solution Take 3 mLs (2.5 mg total) by nebulization every 6 (six) hours as needed for wheezing or shortness of breath. 150 mL 11  . cetirizine (ZYRTEC) 10 MG tablet Take 1 tablet (10 mg total) by mouth daily. 30 tablet 11  . FLUoxetine (PROZAC) 10 MG tablet Take 1 tablet (10 mg total) by mouth daily. 30 tablet 3  . fluticasone (FLONASE) 50 MCG/ACT nasal spray Place 2 sprays into both nostrils daily. 16 g 6  . gabapentin (NEURONTIN) 300 MG capsule Take 1 capsule (300 mg total) by mouth 3 (three) times daily. 90 capsule 3  . hydrochlorothiazide (HYDRODIURIL) 25 MG tablet Take 1 tablet (25 mg total) by mouth daily. 30 tablet 3  . ibuprofen (ADVIL,MOTRIN) 800 MG tablet Take 1 tablet (800 mg total) by mouth every 8 (eight) hours as needed for mild pain or moderate pain.  30 tablet 3  . lisinopril (PRINIVIL,ZESTRIL) 10 MG tablet Take 3 tablets (30 mg total) by mouth daily. 90 tablet 3  . methocarbamol (ROBAXIN) 500 MG tablet Take 1 tablet (500 mg total) by mouth 2 (two) times daily as needed for muscle spasms. 60 tablet 2  . montelukast (SINGULAIR) 10 MG tablet Take 1 tablet (10 mg total) by mouth at bedtime. 30 tablet 3  . pantoprazole (PROTONIX) 20 MG tablet Take 1 tablet (20 mg total) by mouth daily. 30 tablet 3  . HYDROcodone-homatropine (HYCODAN) 5-1.5 MG/5ML syrup Take 5 mLs by mouth every 8 (eight) hours as needed for cough. (Patient not taking: Reported on 05/21/2018) 120 mL 0   No facility-administered medications prior to visit.     No Known Allergies  ROS Review of Systems  Constitutional: Negative.   HENT: Negative.   Eyes: Negative.   Respiratory: Positive for cough and shortness of breath (Occasionally ).   Cardiovascular: Negative.   Gastrointestinal: Negative.   Endocrine: Negative.   Genitourinary: Negative.   Musculoskeletal: Negative.   Skin: Negative.   Allergic/Immunologic:       Seasonal Allergies   Neurological: Positive for dizziness and headaches.  Hematological: Negative.   Psychiatric/Behavioral: Negative.       Objective:    Physical Exam  Constitutional: She is oriented to person, place, and time. She appears well-developed and well-nourished.  HENT:  Head: Normocephalic and atraumatic.  Eyes: Conjunctivae are normal.  Neck: Normal range of motion. Neck supple.  Cardiovascular: Normal rate, regular rhythm, normal heart sounds and intact distal pulses.  Pulmonary/Chest: Effort normal and breath sounds normal.  Abdominal: Soft. Bowel sounds are normal.  Musculoskeletal: Normal range of motion.  Neurological: She is alert and oriented to person, place, and time. She has normal reflexes.  Skin: Skin is warm and dry.  Psychiatric: She has a normal mood and affect. Her behavior is normal. Judgment and thought  content normal.  Nursing note and vitals reviewed.   BP (!) 146/90   Pulse 68   Temp 98.4 F (36.9 C) (Oral)   Ht 5\' 4"  (1.626 m)   Wt 200 lb (90.7 kg)   LMP 05/14/2018   SpO2 100%   BMI 34.33 kg/m  Wt Readings from Last 3 Encounters:  05/21/18 200 lb (90.7 kg)  01/01/18 198 lb (89.8 kg)  11/26/17 198 lb (89.8 kg)    Health Maintenance Due  Topic Date Due  . HIV Screening  12/12/1987  . PAP SMEAR-Modifier  12/11/1993    There are no preventive care reminders to display for this patient.  Lab Results  Component Value Date   TSH 0.392 (L) 05/14/2017   Lab Results  Component Value Date   WBC 3.5 05/14/2017   HGB 14.3 05/14/2017   HCT 43.1 05/14/2017   MCV 86 05/14/2017   PLT 340 05/14/2017   Lab Results  Component Value Date   NA 140 05/14/2017   K 3.6 05/14/2017   CO2 24 05/14/2017   GLUCOSE 118 (H) 05/14/2017   BUN 10 05/14/2017   CREATININE 0.76 05/14/2017   BILITOT 0.2 05/14/2017   ALKPHOS 98 05/14/2017   AST 16 05/14/2017   ALT 17 05/14/2017   PROT 7.2 05/14/2017   ALBUMIN 4.1 05/14/2017   CALCIUM 9.3 05/14/2017   ANIONGAP 11 08/26/2016   Lab Results  Component Value Date   CHOL 154 05/14/2017   Lab Results  Component Value Date   HDL 37 (L) 05/14/2017   Lab Results  Component Value Date   LDLCALC 99 05/14/2017   Lab Results  Component Value Date   TRIG 88 05/14/2017   Lab Results  Component Value Date   CHOLHDL 4.2 05/14/2017   Lab Results  Component Value Date   HGBA1C 5.2 09/12/2017      Assessment & Plan:   1. Shortness of breath Stable today. No signs or symptoms of respiratory distress noted or reported. Continue respiratory medications as prescribed. Note written for patient to return to work on 05/23/2018.  2. Cough Stable.   3. Chronic obstructive pulmonary disease, unspecified COPD type (HCC) Stable today. She has mild chest congestion. Continue inhalers as prescribed.   4. Essential hypertension Blood pressure  is stable at 146/90 today. Continue HCTZ as prescribed. She will continue to decrease high sodium intake, excessive alcohol intake, increase potassium intake, smoking cessation, and increase physical activity of at least 30 minutes of cardio activity daily. She will continue to follow Heart Healthy or DASH diet.  5. Chest congestion Stable.   6. Follow up She will follow in 3 months.   No orders of the defined types were placed in this encounter.   No orders of the defined types were placed in this encounter.   Referral Orders  No referral(s) requested today    Raliegh Ip,  MSN, FNP-C [facility] [address] [place], [place] [address] [phone]  Problem List Items Addressed This Visit      Respiratory   Chronic obstructive pulmonary disease (HCC)     Other   Cough   Shortness of breath - Primary    Other Visit Diagnoses    Essential hypertension       Chest congestion       Follow up          No orders of the defined types were placed in this encounter.   Follow-up: Return in about 3 months (around 08/20/2018).    Kallie Locks, FNP

## 2018-05-22 ENCOUNTER — Encounter: Payer: Self-pay | Admitting: Family Medicine

## 2018-05-23 ENCOUNTER — Telehealth: Payer: Self-pay

## 2018-05-23 DIAGNOSIS — R0989 Other specified symptoms and signs involving the circulatory and respiratory systems: Secondary | ICD-10-CM | POA: Insufficient documentation

## 2018-05-23 NOTE — Telephone Encounter (Signed)
Do you have the paperwork for this patient. She called stating that we were suppose to send yesterday. Patient advise that you were out of office and probably would be tomorrow.

## 2018-05-24 ENCOUNTER — Telehealth: Payer: Self-pay | Admitting: Family Medicine

## 2018-05-24 ENCOUNTER — Telehealth: Payer: Self-pay

## 2018-05-24 NOTE — Telephone Encounter (Signed)
Patient states that they receive the paperwork and need a letter taking patient out of work from 4/19 until her condition improves. Need this to fax to [employer]

## 2018-05-24 NOTE — Telephone Encounter (Signed)
Contacted Disability [employer] concerning disability form to request additional information needed on form. Attempted to contact multiple times at [phone] Ext. [phone], Mr. Turner Daniels. Mr. Erby Pian attempted to call back, but I was seeing patients at that time. Additional message left to return call.    Raliegh Ip,  MSN, FNP-C [facility] [address] [place], [place] [address] [phone]

## 2018-05-29 MED FILL — IBUPROFEN 800 MG TAB: 800 | 10 days supply | Qty: 30 | Fill #0

## 2018-05-29 MED FILL — HYDROCHLOROTHIAZIDE 25 MG T: 25 | 30 days supply | Qty: 30 | Fill #0

## 2018-05-29 MED FILL — CETIRIZINE HCL 10 MG TABS: 10 | 30 days supply | Qty: 30 | Fill #1

## 2018-05-29 MED FILL — FLUoxetine HCL 10 MG CAPS: 10 | 30 days supply | Qty: 30 | Fill #0

## 2018-05-31 ENCOUNTER — Ambulatory Visit (INDEPENDENT_AMBULATORY_CARE_PROVIDER_SITE_OTHER): Payer: Self-pay | Admitting: Family Medicine

## 2018-05-31 ENCOUNTER — Encounter: Payer: Self-pay | Admitting: Family Medicine

## 2018-05-31 ENCOUNTER — Other Ambulatory Visit: Payer: Self-pay

## 2018-05-31 VITALS — BP 152/80 | HR 84 | Temp 97.7°F | Ht 64.0 in | Wt 200.0 lb

## 2018-05-31 DIAGNOSIS — J449 Chronic obstructive pulmonary disease, unspecified: Secondary | ICD-10-CM

## 2018-05-31 DIAGNOSIS — R05 Cough: Secondary | ICD-10-CM

## 2018-05-31 DIAGNOSIS — R059 Cough, unspecified: Secondary | ICD-10-CM

## 2018-05-31 DIAGNOSIS — R0602 Shortness of breath: Secondary | ICD-10-CM

## 2018-05-31 DIAGNOSIS — Z09 Encounter for follow-up examination after completed treatment for conditions other than malignant neoplasm: Secondary | ICD-10-CM

## 2018-05-31 DIAGNOSIS — R0989 Other specified symptoms and signs involving the circulatory and respiratory systems: Secondary | ICD-10-CM

## 2018-05-31 NOTE — Progress Notes (Signed)
[facility]  Established Patient Office Visit  Subjective:  Patient ID: Catherine Kane, female    DOB: [birth year]  Age: 48 y.o. MRN: [id number]  CC:  Chief Complaint  Patient presents with  . Follow-up    FMLA,cough,asthma,sob    HPI Catherine Kane is a year old female who presents for Follow Up.   Past Medical History:  Diagnosis Date  . Asthma   . Bronchitis   . Chronic obstructive pulmonary disease (HCC) 05/03/2018  . Hypertension    Current Status: Since her last office visit, she continues to have intermittent cough, shortness of breath, and congestion. She is also here today for paperwork. She denies fevers, chills, fatigue, recent infections, weight loss, and night sweats. She has not had any headaches, visual changes, dizziness, and falls. No chest pain, and heart palpitations reported. No reports of GI problems such as nausea, vomiting, diarrhea, and constipation. She has no reports of blood in stools, dysuria and hematuria. No depression or anxiety reported. She denies pain today.    Past Surgical History:  Procedure Laterality Date  . TONSILLECTOMY      Family History  Problem Relation Age of Onset  . Heart disease Mother   . Heart disease Sister     Social History   Socioeconomic History  . Marital status: Single    Spouse name: Not on file  . Number of children: Not on file  . Years of education: Not on file  . Highest education level: Not on file  Occupational History  . Not on file  Social Needs  . Financial resource strain: Not on file  . Food insecurity:    Worry: Not on file    Inability: Not on file  . Transportation needs:    Medical: Not on file    Non-medical: Not on file  Tobacco Use  . Smoking status: Never Smoker  . Smokeless tobacco: Never Used  Substance and Sexual Activity  . Alcohol use: No  . Drug use: No  . Sexual activity: Yes  Lifestyle  .  Physical activity:    Days per week: Not on file    Minutes per session: Not on file  . Stress: Not on file  Relationships  . Social connections:    Talks on phone: Not on file    Gets together: Not on file    Attends religious service: Not on file    Active member of club or organization: Not on file    Attends meetings of clubs or organizations: Not on file    Relationship status: Not on file  . Intimate partner violence:    Fear of current or ex partner: Not on file    Emotionally abused: Not on file    Physically abused: Not on file    Forced sexual activity: Not on file  Other Topics Concern  . Not on file  Social History Narrative  . Not on file    Outpatient Medications Prior to Visit  Medication Sig Dispense Refill  . acyclovir (ZOVIRAX) 200 MG capsule Take 1 capsule (200 mg total) by mouth 2 (two) times daily. 20 capsule 1  . albuterol (PROVENTIL HFA;VENTOLIN HFA) 108 (90 Base) MCG/ACT inhaler Inhale 2 puffs into the lungs every 6 (six) hours as needed for wheezing or shortness of breath. 1 Inhaler 11  . albuterol (PROVENTIL) (2.5 MG/3ML) 0.083% nebulizer solution Take 3 mLs (2.5 mg total) by nebulization every 6 (  six) hours as needed for wheezing or shortness of breath. 150 mL 11  . cetirizine (ZYRTEC) 10 MG tablet Take 1 tablet (10 mg total) by mouth daily. 30 tablet 11  . FLUoxetine (PROZAC) 10 MG tablet Take 1 tablet (10 mg total) by mouth daily. 30 tablet 3  . fluticasone (FLONASE) 50 MCG/ACT nasal spray Place 2 sprays into both nostrils daily. 16 g 6  . gabapentin (NEURONTIN) 300 MG capsule Take 1 capsule (300 mg total) by mouth 3 (three) times daily. 90 capsule 3  . hydrochlorothiazide (HYDRODIURIL) 25 MG tablet Take 1 tablet (25 mg total) by mouth daily. 30 tablet 3  . HYDROcodone-homatropine (HYCODAN) 5-1.5 MG/5ML syrup Take 5 mLs by mouth every 8 (eight) hours as needed for cough. 120 mL 0  . ibuprofen (ADVIL,MOTRIN) 800 MG tablet Take 1 tablet (800 mg total) by  mouth every 8 (eight) hours as needed for mild pain or moderate pain. 30 tablet 3  . lisinopril (PRINIVIL,ZESTRIL) 10 MG tablet Take 3 tablets (30 mg total) by mouth daily. 90 tablet 3  . methocarbamol (ROBAXIN) 500 MG tablet Take 1 tablet (500 mg total) by mouth 2 (two) times daily as needed for muscle spasms. 60 tablet 2  . montelukast (SINGULAIR) 10 MG tablet Take 1 tablet (10 mg total) by mouth at bedtime. 30 tablet 3  . pantoprazole (PROTONIX) 20 MG tablet Take 1 tablet (20 mg total) by mouth daily. 30 tablet 3   No facility-administered medications prior to visit.     No Known Allergies  ROS Review of Systems  Constitutional: Negative.   HENT: Negative.   Eyes: Negative.   Respiratory: Positive for cough and shortness of breath.        Chest congestion  Cardiovascular: Negative.   Gastrointestinal: Negative.   Endocrine: Negative.   Genitourinary: Negative.   Musculoskeletal: Negative.   Skin: Negative.   Allergic/Immunologic: Negative.   Neurological: Positive for dizziness and headaches.  Hematological: Negative.   Psychiatric/Behavioral: Negative.       Objective:    Physical Exam  Constitutional: She is oriented to person, place, and time. She appears well-developed and well-nourished.  HENT:  Head: Normocephalic and atraumatic.  Eyes: Conjunctivae are normal.  Neck: Normal range of motion. Neck supple.  Cardiovascular: Normal rate, regular rhythm, normal heart sounds and intact distal pulses.  Pulmonary/Chest: Effort normal and breath sounds normal.  Abdominal: Soft. Bowel sounds are normal.  Musculoskeletal: Normal range of motion.  Neurological: She is alert and oriented to person, place, and time. She has normal reflexes.  Skin: Skin is warm and dry.  Psychiatric: She has a normal mood and affect. Her behavior is normal. Judgment and thought content normal.  Nursing note and vitals reviewed.   BP (!) 152/80 (BP Location: Left Arm, Patient Position:  Sitting, Cuff Size: Large)   Pulse 84   Temp 97.7 F (36.5 C) (Oral)   Ht 5\' 4"  (1.626 m)   Wt 200 lb (90.7 kg)   LMP 05/14/2018   SpO2 97%   BMI 34.33 kg/m  Wt Readings from Last 3 Encounters:  05/31/18 200 lb (90.7 kg)  05/21/18 200 lb (90.7 kg)  01/01/18 198 lb (89.8 kg)     Health Maintenance Due  Topic Date Due  . HIV Screening  12/12/1987  . PAP SMEAR-Modifier  12/11/1993    There are no preventive care reminders to display for this patient.  Lab Results  Component Value Date   TSH 0.392 (L) 05/14/2017  Lab Results  Component Value Date   WBC 3.5 05/14/2017   HGB 14.3 05/14/2017   HCT 43.1 05/14/2017   MCV 86 05/14/2017   PLT 340 05/14/2017   Lab Results  Component Value Date   NA 140 05/14/2017   K 3.6 05/14/2017   CO2 24 05/14/2017   GLUCOSE 118 (H) 05/14/2017   BUN 10 05/14/2017   CREATININE 0.76 05/14/2017   BILITOT 0.2 05/14/2017   ALKPHOS 98 05/14/2017   AST 16 05/14/2017   ALT 17 05/14/2017   PROT 7.2 05/14/2017   ALBUMIN 4.1 05/14/2017   CALCIUM 9.3 05/14/2017   ANIONGAP 11 08/26/2016   Lab Results  Component Value Date   CHOL 154 05/14/2017   Lab Results  Component Value Date   HDL 37 (L) 05/14/2017   Lab Results  Component Value Date   LDLCALC 99 05/14/2017   Lab Results  Component Value Date   TRIG 88 05/14/2017   Lab Results  Component Value Date   CHOLHDL 4.2 05/14/2017   Lab Results  Component Value Date   HGBA1C 5.2 09/12/2017   Assessment & Plan:   1. Chronic obstructive pulmonary disease, unspecified COPD type (HCC) Stable today. No signs of exacerbation today.   2. Chest congestion Stable. She will continue medications as prescribed.   3. Shortness of breath Stable today. No signs or symptoms of respiratory distress noted or reported today.   4. Cough occasional  5. Follow up She will follow up 08/2018.  No orders of the defined types were placed in this encounter.   No orders of the defined  types were placed in this encounter.   Referral Orders  No referral(s) requested today    Raliegh Ip,  MSN, FNP-C [facility] [address] [place], [place] [address] [phone]   Problem List Items Addressed This Visit      Respiratory   Chest congestion   Chronic obstructive pulmonary disease (HCC) - Primary     Other   Cough   Shortness of breath    Other Visit Diagnoses    Follow up          No orders of the defined types were placed in this encounter.   Follow-up: No follow-ups on file.    Kallie Locks, FNP

## 2018-06-03 NOTE — Telephone Encounter (Signed)
-----   Message from Kallie Locks, FNP sent at 05/31/2018 10:02 PM EDT ----- Regarding: "Papperwork" Paperwork completed today. Left on your computer. Please inform patient.

## 2018-06-03 NOTE — Telephone Encounter (Signed)
Patient notified

## 2018-06-10 ENCOUNTER — Telehealth: Payer: Self-pay

## 2018-06-10 NOTE — Telephone Encounter (Signed)
Patient states that if the paperwork doesn't say continuous she will loses her job because she has been out sh=ince March. Patient is aware of the previous message.

## 2018-06-10 NOTE — Telephone Encounter (Signed)
Patient would like to know if you would place her on a steroid for her asthma. Patient states that is what Health at work is advising needs to be done. Patient also states that another form was sent over for the part that says intermittent to be changed to continuous.

## 2018-06-11 ENCOUNTER — Other Ambulatory Visit: Payer: Self-pay | Admitting: Family Medicine

## 2018-06-13 NOTE — Telephone Encounter (Signed)
Karren Burly gave [place] the number for the [employer] representative.

## 2018-06-25 ENCOUNTER — Encounter: Payer: Self-pay | Admitting: Internal Medicine

## 2018-06-25 ENCOUNTER — Ambulatory Visit (INDEPENDENT_AMBULATORY_CARE_PROVIDER_SITE_OTHER): Payer: Self-pay | Admitting: Internal Medicine

## 2018-06-25 ENCOUNTER — Other Ambulatory Visit: Payer: Self-pay

## 2018-06-25 VITALS — BP 150/96 | HR 79 | Temp 98.6°F | Ht 64.0 in | Wt 208.6 lb

## 2018-06-25 DIAGNOSIS — I10 Essential (primary) hypertension: Secondary | ICD-10-CM | POA: Insufficient documentation

## 2018-06-25 DIAGNOSIS — R059 Cough, unspecified: Secondary | ICD-10-CM

## 2018-06-25 DIAGNOSIS — R05 Cough: Secondary | ICD-10-CM

## 2018-06-25 MED ORDER — PANTOPRAZOLE SODIUM 40 MG PO TBEC
40.0000 mg | DELAYED_RELEASE_TABLET | Freq: Every day | ORAL | 2 refills | Status: DC
Start: 2018-06-25 — End: 2018-10-25

## 2018-06-25 MED ORDER — FAMOTIDINE 20 MG PO TABS
ORAL_TABLET | ORAL | 11 refills | Status: DC
Start: 2018-06-25 — End: 2018-07-25

## 2018-06-25 MED ORDER — TELMISARTAN 40 MG PO TABS: 40.0000 mg | ORAL_TABLET | Freq: Every day | ORAL | 11 refills | Status: DC

## 2018-06-25 MED ORDER — PREDNISONE 10 MG PO TABS: ORAL_TABLET | ORAL | 0 refills | Status: DC

## 2018-06-25 MED FILL — predniSONE 10 MG TABS: 10 | 6 days supply | Qty: 14 | Fill #0

## 2018-06-25 MED FILL — TELMISARTAN 40 MG TABLET: 40 | 30 days supply | Qty: 30 | Fill #0

## 2018-06-25 MED FILL — PANTOPRAZOLE SOD DR 40 MG T: 40 | 30 days supply | Qty: 30 | Fill #0

## 2018-06-25 NOTE — Assessment & Plan Note (Signed)
Try change acei to arb 06/25/2018 due to cough/ pseudoasthma   In the best review of chronic cough to date ( NEJM 2016 375 [id number]) ,  ACEi are now felt to cause cough in up to  20% of pts which is a 4 fold increase from previous reports and does not include the variety of non-specific complaints we see in pulmonary clinic in pts on ACEi but previously attributed to another dx like  Copd/asthma and  include PNDS, throat and chest congestion, "bronchitis", unexplained dyspnea and noct "strangling" sensations, and hoarseness, but also  atypical /refractory GERD symptoms like dysphagia and "bad heartburn"   The only way I know  to prove this is not an "ACEi Case" is a trial off ACEi x a minimum of 6 weeks then regroup.   >>> try micardis 40 mg can take 2 if 1 not effective/ self monitor    Total time devoted to counseling  > 50 % of initial 60 min office visit:  review case with pt/ discussion of options/alternatives/ personally creating written customized instructions  in presence of pt  then going over those specific  Instructions directly with the pt including how to use all of the meds but in particular covering each new medication in detail and the difference between the maintenance= "automatic" meds and the prns using an action plan format for the latter (If this problem/symptom => do that organization reading Left to right).  Please see AVS from this visit for a full list of these instructions which I personally wrote for this pt and  are unique to this visit.

## 2018-06-25 NOTE — Assessment & Plan Note (Signed)
Onset cough March 2020 - d/c acei 06/25/2018 and max rx for gerd  The most common causes of chronic cough in immunocompetent adults include the following: upper airway cough syndrome (UACS), previously referred to as postnasal drip syndrome (PNDS), which is caused by variety of rhinosinus conditions; (2) asthma; (3) GERD; (4) chronic bronchitis from cigarette smoking or other inhaled environmental irritants; (5) nonasthmatic eosinophilic bronchitis; and (6) bronchiectasis.   These conditions, singly or in combination, have accounted for up to 94% of the causes of chronic cough in prospective studies.   Other conditions have constituted no >6% of the causes in prospective studies These have included bronchogenic carcinoma, chronic interstitial pneumonia, sarcoidosis, left ventricular failure, ACEI-induced cough, and aspiration from a condition associated with pharyngeal dysfunction.    Chronic cough is often simultaneously caused by more than one condition. A single cause has been found from 38 to 82% of the time, multiple causes from 18 to 62%. Multiply caused cough has been the result of three diseases up to 42% of the time.   Lack of convincing response even to prednisone strongly supports dx of UACS over asthma related cough =>>>  Upper airway cough syndrome (previously labeled PNDS),  is so named because it's frequently impossible to sort out how much is  CR/sinusitis with freq throat clearing (which can be related to primary GERD)   vs  causing  secondary (" extra esophageal")  GERD from wide swings in gastric pressure that occur with throat clearing, often  promoting self use of mint and menthol lozenges that reduce the lower esophageal sphincter tone and exacerbate the problem further in a cyclical fashion.   These are the same pts (now being labeled as having "irritable larynx syndrome" by some cough centers) who not infrequently have a history of having failed to tolerate ace inhibitors,  dry  powder inhalers or biphosphonates or report having atypical/extraesophageal reflux symptoms that don't respond to standard doses of PPI  and are easily confused as having aecopd or asthma flares by even experienced allergists/ pulmonologists (myself included).    rec try off acei, on max acid suppression/ diet an one more short course of prednisone and f/u in 6 weeks, sooner if needed

## 2018-06-25 NOTE — Progress Notes (Signed)
Catherine Kane, female    DOB: Jun 04, 1970,     MRN: [id number]   Brief patient profile:  77 yobf never smoker ? JRA so not very active as child/young adult but no resp issues with 6 IUP's last in 2000 and baseline wt 185 with hbpp  1st child and maint on lisonopril ? since when?  and around 2005 noted onset cough dx of asthma in [place] with season change typically exac with uri  And in no need for any maint rx other protonix but then while on ppi April 25 2018 severe cough /sob refractory to saba hfa/neb/singulair/flonase/ zyrtec/no prednisone > put out of work since March 19th and not worked since and 4 ov's with HCP's  So self referred to pulmonary 06/25/2018      History of Present Illness  06/25/2018  Pulmonary/ 1st office eval/Zayvian Mcmurtry  Chief Complaint  Patient presents with   Pulmonary Consult    Self referral. Pt c/o cough and increased SOB for the past several wks. Cough is non prod.   Dyspnea:  Mailbox and back prior to March 19 no problem, flat 50 ft ok now struggles /make cough Cough: dry/ day > noct more with movement , sitting fine, sleeping fine / speaking makes it worse / so does laughing/ blowing up balloon  SABA use: 2 hfa's 1 neb no better / pred always helps some for a few days at least.  No obvious day to day or daytime variability or assoc excess/ purulent sputum or mucus plugs or hemoptysis or cp or chest tightness, subjective wheeze or overt sinus or hb symptoms.   Sleeping flat  without nocturnal  or early am exacerbation  of respiratory  c/o's or need for noct saba. Also denies any obvious fluctuation of symptoms with weather or environmental changes or other aggravating or alleviating factors except as outlined above   No unusual exposure hx or h/o childhood pna/ asthma or knowledge of premature birth.  Current Allergies, Complete Past Medical History, Past Surgical History, Family History, and Social History were reviewed in [employer] record.  ROS  The following are not active complaints unless bolded Hoarseness, sore throat, dysphagia, dental problems, itching, sneezing,  nasal congestion or discharge of excess mucus or purulent secretions, ear ache,   fever, chills, sweats, unintended wt loss or wt gain, classically pleuritic or exertional cp,  orthopnea pnd or arm/hand swelling  or leg swelling, presyncope, palpitations, abdominal pain, anorexia, nausea, vomiting, diarrhea  or change in bowel habits or change in bladder habits, change in stools or change in urine, dysuria, hematuria,  rash, arthralgias, visual complaints, headache, numbness, weakness or ataxia or problems with walking or coordination,  change in mood or  memory.           Past Medical History:  Diagnosis Date   Asthma    Bronchitis    Chronic obstructive pulmonary disease (HCC) 05/03/2018   Hypertension     Outpatient Medications Prior to Visit  Medication Sig Dispense Refill   acyclovir (ZOVIRAX) 200 MG capsule Take 1 capsule (200 mg total) by mouth 2 (two) times daily. 20 capsule 1   albuterol (PROVENTIL HFA;VENTOLIN HFA) 108 (90 Base) MCG/ACT inhaler Inhale 2 puffs into the lungs every 6 (six) hours as needed for wheezing or shortness of breath. 1 Inhaler 11   albuterol (PROVENTIL) (2.5 MG/3ML) 0.083% nebulizer solution Take 3 mLs (2.5 mg total) by nebulization every 6 (six) hours as needed for wheezing or shortness  of breath. 150 mL 11   cetirizine (ZYRTEC) 10 MG tablet Take 1 tablet (10 mg total) by mouth daily. 30 tablet 11   FLUoxetine (PROZAC) 10 MG tablet Take 1 tablet (10 mg total) by mouth daily. 30 tablet 3   fluticasone (FLONASE) 50 MCG/ACT nasal spray Place 2 sprays into both nostrils daily. 16 g 6   gabapentin (NEURONTIN) 300 MG capsule Take 1 capsule (300 mg total) by mouth 3 (three) times daily. 90 capsule 3   hydrochlorothiazide (HYDRODIURIL) 25 MG tablet Take 1 tablet (25 mg total) by mouth daily. 30 tablet 3    HYDROcodone-homatropine (HYCODAN) 5-1.5 MG/5ML syrup Take 5 mLs by mouth every 8 (eight) hours as needed for cough. 120 mL 0   ibuprofen (ADVIL,MOTRIN) 800 MG tablet Take 1 tablet (800 mg total) by mouth every 8 (eight) hours as needed for mild pain or moderate pain. 30 tablet 3   lisinopril (PRINIVIL,ZESTRIL) 10 MG tablet Take 3 tablets (30 mg total) by mouth daily. 90 tablet 3   methocarbamol (ROBAXIN) 500 MG tablet Take 1 tablet (500 mg total) by mouth 2 (two) times daily as needed for muscle spasms. 60 tablet 2   montelukast (SINGULAIR) 10 MG tablet Take 1 tablet (10 mg total) by mouth at bedtime. 30 tablet 3   pantoprazole (PROTONIX) 20 MG tablet Take 1 tablet (20 mg total) by mouth daily. 30 tablet 3      Objective:     BP (!) 150/96 (BP Location: Left Arm, Cuff Size: Normal)    Pulse 79    Temp 98.6 F (37 C) (Oral)    Ht 5\' 4"  (1.626 m)    Wt 208 lb 9.6 oz (94.6 kg)    SpO2 100%    BMI 35.81 kg/m   SpO2: 100 % RA  Mod obese pleasant amb bf nad with classic voice fatigue  - nopte bp 150/96   HEENT: nl dentition, turbinates bilaterally, and oropharynx. Nl external ear canals without cough reflex   NECK :  without JVD/Nodes/TM/ nl carotid upstrokes bilaterally   LUNGS: no acc muscle use,  Nl contour chest which is clear to A and P bilaterally without cough on insp or exp maneuvers   CV:  RRR  no s3 or murmur or increase in P2, and no edema   ABD:  soft and nontender with nl inspiratory excursion in the supine position. No bruits or organomegaly appreciated, bowel sounds nl  MS:  Nl gait/ ext warm without deformities, calf tenderness, cyanosis or clubbing No obvious joint restrictions   SKIN: warm and dry without lesions    NEURO:  alert, approp, nl sensorium with  no motor or cerebellar deficits apparent.       Assessment   Cough Onset cough March 2020 - d/c acei 06/25/2018 and max rx for gerd  The most common causes of chronic cough in immunocompetent adults  include the following: upper airway cough syndrome (UACS), previously referred to as postnasal drip syndrome (PNDS), which is caused by variety of rhinosinus conditions; (2) asthma; (3) GERD; (4) chronic bronchitis from cigarette smoking or other inhaled environmental irritants; (5) nonasthmatic eosinophilic bronchitis; and (6) bronchiectasis.   These conditions, singly or in combination, have accounted for up to 94% of the causes of chronic cough in prospective studies.   Other conditions have constituted no >6% of the causes in prospective studies These have included bronchogenic carcinoma, chronic interstitial pneumonia, sarcoidosis, left ventricular failure, ACEI-induced cough, and aspiration from a condition associated  with pharyngeal dysfunction.    Chronic cough is often simultaneously caused by more than one condition. A single cause has been found from 38 to 82% of the time, multiple causes from 18 to 62%. Multiply caused cough has been the result of three diseases up to 42% of the time.   Lack of convincing response even to prednisone strongly supports dx of UACS over asthma related cough =>>>  Upper airway cough syndrome (previously labeled PNDS),  is so named because it's frequently impossible to sort out how much is  CR/sinusitis with freq throat clearing (which can be related to primary GERD)   vs  causing  secondary (" extra esophageal")  GERD from wide swings in gastric pressure that occur with throat clearing, often  promoting self use of mint and menthol lozenges that reduce the lower esophageal sphincter tone and exacerbate the problem further in a cyclical fashion.   These are the same pts (now being labeled as having "irritable larynx syndrome" by some cough centers) who not infrequently have a history of having failed to tolerate ace inhibitors,  dry powder inhalers or biphosphonates or report having atypical/extraesophageal reflux symptoms that don't respond to standard doses of PPI   and are easily confused as having aecopd or asthma flares by even experienced allergists/ pulmonologists (myself included).    rec try off acei, on max acid suppression/ diet an one more short course of prednisone and f/u in 6 weeks, sooner if needed         Essential hypertension Try change acei to arb 06/25/2018 due to cough/ pseudoasthma   In the best review of chronic cough to date ( NEJM 2016 375 [id number]) ,  ACEi are now felt to cause cough in up to  20% of pts which is a 4 fold increase from previous reports and does not include the variety of non-specific complaints we see in pulmonary clinic in pts on ACEi but previously attributed to another dx like  Copd/asthma and  include PNDS, throat and chest congestion, "bronchitis", unexplained dyspnea and noct "strangling" sensations, and hoarseness, but also  atypical /refractory GERD symptoms like dysphagia and "bad heartburn"   The only way I know  to prove this is not an "ACEi Case" is a trial off ACEi x a minimum of 6 weeks then regroup.   >>> try micardis 40 mg can take 2 if 1 not effective/ self monitor    Total time devoted to counseling  > 50 % of initial 60 min office visit:  review case with pt/ discussion of options/alternatives/ personally creating written customized instructions  in presence of pt  then going over those specific  Instructions directly with the pt including how to use all of the meds but in particular covering each new medication in detail and the difference between the maintenance= "automatic" meds and the prns using an action plan format for the latter (If this problem/symptom => do that organization reading Left to right).  Please see AVS from this visit for a full list of these instructions which I personally wrote for this pt and  are unique to this visit.      Sandrea HughsMichael Joyice Magda, MD 06/25/2018

## 2018-06-25 NOTE — Patient Instructions (Addendum)
Stop lisinopril and start micardis 40 mg one daily   Prednisone 10 mg take  4 each am x 2 days,   2 each am x 2 days,  1 each am x 2 days and stop   Change protonix to 40 mg Take 30-60 min before first meal of the day and pepcid 20 mg after supper  until you return   Best cough syrup is delsym 2tsp every 12 hours as needed    GERD (REFLUX)  is an extremely common cause of respiratory symptoms just like yours , many times with no obvious heartburn at all.    It can be treated with medication, but also with lifestyle changes including elevation of the head of your bed (ideally with 6 -8inch blocks under the headboard of your bed),  Smoking cessation, avoidance of late meals, excessive alcohol, and avoid fatty foods, chocolate, peppermint, colas, red wine, and acidic juices such as orange juice.  NO MINT OR MENTHOL PRODUCTS SO NO COUGH DROPS  USE SUGARLESS CANDY INSTEAD (Jolley ranchers or Stover's or Life Savers) or even ice chips will also do - the key is to swallow to prevent all throat clearing. NO OIL BASED VITAMINS - use powdered substitutes.  Avoid fish oil when coughing.   Only use your albuterol as a rescue medication to be used if you can't catch your breath by resting or doing a relaxed purse lip breathing pattern.  - The less you use it, the better it will work when you need it. - Ok to use up to 2 puffs  every 4 hours if you must but call for immediate appointment if use goes up over your usual need - Don't leave home without it !!  (think of it like the spare tire for your car)    Please schedule a follow up office visit in 6 weeks, call sooner if needed with all medications /inhalers/ solutions in hand so we can verify exactly what you are taking. This includes all medications from all doctors and over the counters

## 2018-06-26 ENCOUNTER — Telehealth: Payer: Self-pay

## 2018-06-26 NOTE — Telephone Encounter (Signed)
Patient was calling to speak with you regarding her office visit with the pulmonologist.

## 2018-06-27 NOTE — Telephone Encounter (Signed)
Patient will be schedule  

## 2018-06-28 ENCOUNTER — Other Ambulatory Visit: Payer: Self-pay

## 2018-06-28 ENCOUNTER — Telehealth: Payer: Self-pay | Admitting: Family Medicine

## 2018-06-28 ENCOUNTER — Ambulatory Visit (INDEPENDENT_AMBULATORY_CARE_PROVIDER_SITE_OTHER): Payer: Self-pay | Admitting: Family Medicine

## 2018-06-28 DIAGNOSIS — R059 Cough, unspecified: Secondary | ICD-10-CM

## 2018-06-28 DIAGNOSIS — R0602 Shortness of breath: Secondary | ICD-10-CM

## 2018-06-28 DIAGNOSIS — M549 Dorsalgia, unspecified: Secondary | ICD-10-CM

## 2018-06-28 DIAGNOSIS — Z09 Encounter for follow-up examination after completed treatment for conditions other than malignant neoplasm: Secondary | ICD-10-CM

## 2018-06-28 DIAGNOSIS — I10 Essential (primary) hypertension: Secondary | ICD-10-CM

## 2018-06-28 DIAGNOSIS — R05 Cough: Secondary | ICD-10-CM

## 2018-06-28 DIAGNOSIS — Z8709 Personal history of other diseases of the respiratory system: Secondary | ICD-10-CM

## 2018-06-28 NOTE — Telephone Encounter (Signed)
Sent to [facility] 

## 2018-06-28 NOTE — Telephone Encounter (Signed)
Patient states that she thought she was suppose to be out of work until the 28th but [employer] [employer] is saying that it was only until the 20th. Patient needs a note sent to [employer] taking her out until the 28th.

## 2018-06-28 NOTE — Telephone Encounter (Signed)
[employer] Fax# [phone]

## 2018-06-28 NOTE — Progress Notes (Signed)
Virtual Visit via Telephone Note  I connected with Catherine Kane on 06/28/18 at  8:00 AM EDT by telephone and verified that I am speaking with the correct person using two identifiers.   I discussed the limitations, risks, security and privacy concerns of performing an evaluation and management service by telephone and the availability of in person appointments. I also discussed with the patient that there may be a patient responsible charge related to this service. The patient expressed understanding and agreed to proceed.   History of Present Illness:  Past Medical History:  Diagnosis Date  . Asthma   . Bronchitis   . Chronic obstructive pulmonary disease (HCC) 05/03/2018  . Hypertension     Current Outpatient Medications on File Prior to Visit  Medication Sig Dispense Refill  . acyclovir (ZOVIRAX) 200 MG capsule Take 1 capsule (200 mg total) by mouth 2 (two) times daily. 20 capsule 1  . albuterol (PROVENTIL HFA;VENTOLIN HFA) 108 (90 Base) MCG/ACT inhaler Inhale 2 puffs into the lungs every 6 (six) hours as needed for wheezing or shortness of breath. 1 Inhaler 11  . albuterol (PROVENTIL) (2.5 MG/3ML) 0.083% nebulizer solution Take 3 mLs (2.5 mg total) by nebulization every 6 (six) hours as needed for wheezing or shortness of breath. 150 mL 11  . cetirizine (ZYRTEC) 10 MG tablet Take 1 tablet (10 mg total) by mouth daily. 30 tablet 11  . famotidine (PEPCID) 20 MG tablet One at bedtime 30 tablet 11  . FLUoxetine (PROZAC) 10 MG tablet Take 1 tablet (10 mg total) by mouth daily. 30 tablet 3  . fluticasone (FLONASE) 50 MCG/ACT nasal spray Place 2 sprays into both nostrils daily. 16 g 6  . gabapentin (NEURONTIN) 300 MG capsule Take 1 capsule (300 mg total) by mouth 3 (three) times daily. 90 capsule 3  . hydrochlorothiazide (HYDRODIURIL) 25 MG tablet Take 1 tablet (25 mg total) by mouth daily. 30 tablet 3  . ibuprofen (ADVIL,MOTRIN) 800 MG tablet Take 1 tablet (800 mg total) by  mouth every 8 (eight) hours as needed for mild pain or moderate pain. 30 tablet 3  . methocarbamol (ROBAXIN) 500 MG tablet Take 1 tablet (500 mg total) by mouth 2 (two) times daily as needed for muscle spasms. 60 tablet 2  . montelukast (SINGULAIR) 10 MG tablet Take 1 tablet (10 mg total) by mouth at bedtime. 30 tablet 3  . pantoprazole (PROTONIX) 40 MG tablet Take 1 tablet (40 mg total) by mouth daily. Take 30-60 min before first meal of the day 30 tablet 2  . predniSONE (DELTASONE) 10 MG tablet Take  4 each am x 2 days,   2 each am x 2 days,  1 each am x 2 days and stop 14 tablet 0  . telmisartan (MICARDIS) 40 MG tablet Take 1 tablet (40 mg total) by mouth daily. 30 tablet 11   No current facility-administered medications on file prior to visit.     Current Status: Since Catherine Kane last office visit, she recent appointment at [facility], Dr. Sherene Sires. She states that she was placed on a Prednisone Taper, discontinued Lisinopril and placed on Micardis. Catherine Kane 1st day back to work will be 07/04/2018, if she is no longer coughing. She continues to report frequent cough and shortness of breath.   She denies fevers, chills, fatigue, recent infections, weight loss, and night sweats. She has not had any headaches, visual changes, dizziness, and falls. No chest pain, and heart palpitations reported. No reports of GI problems  such as nausea, vomiting, diarrhea, and constipation. She has no reports of blood in stools, dysuria and hematuria. No depression or anxiety reported. She denies pain today.   Observations/Objective:  Telephone Virtual Visit.   Assessment and Plan:  1. History of bronchitis Stable today.   2. Cough Mild   3. Shortness of breath Stab;e. No signs or symptoms of respiratory distress reported   4. Essential hypertension She will continue to decrease high sodium intake, excessive alcohol intake, increase potassium intake, smoking cessation, and increase physical activity of at  least 30 minutes of cardio activity daily. She will continue to follow Heart Healthy or DASH diet.  5. Back pain, unspecified back location, unspecified back pain laterality, unspecified chronicity  No orders of the defined types were placed in this encounter.  No orders of the defined types were placed in this encounter.   Referral Orders  No referral(s) requested today    [name],  MSN, FNP-C [facility] [address]  [place], [place] [address] [phone]  Follow Up Instructions:  She will follow up in 3 months.   I discussed the assessment and treatment plan with the patient. The patient was provided an opportunity to ask questions and all were answered. The patient agreed with the plan and demonstrated an understanding of the instructions.   The patient was advised to call back or seek an in-person evaluation if the symptoms worsen or if the condition fails to improve as anticipated.  I provided 20 minutes of non-face-to-face time during this encounter.   [name], FNP

## 2018-07-02 MED FILL — GABAPENTIN 300 MG CAPSULE: 300 | 30 days supply | Qty: 90 | Fill #0

## 2018-07-02 NOTE — Telephone Encounter (Signed)
Patient will have them send forms over

## 2018-07-03 NOTE — Telephone Encounter (Signed)
Forms have been received and given to provider. Patient is aware and knows that we have 14 days to complete paperwork

## 2018-07-05 ENCOUNTER — Telehealth: Payer: Self-pay | Admitting: Internal Medicine

## 2018-07-05 NOTE — Telephone Encounter (Signed)
Instructions  Stop lisinopril and start micardis 40 mg one daily   Prednisone 10 mg take  4 each am x 2 days,   2 each am x 2 days,  1 each am x 2 days and stop   Change protonix to 40 mg Take 30-60 min before first meal of the day and pepcid 20 mg after supper  until you return   Best cough syrup is delsym 2tsp every 12 hours as needed    GERD (REFLUX)  is an extremely common cause of respiratory symptoms just like yours , many times with no obvious heartburn at all.    It can be treated with medication, but also with lifestyle changes including elevation of the head of your bed (ideally with 6 -8inch blocks under the headboard of your bed),  Smoking cessation, avoidance of late meals, excessive alcohol, and avoid fatty foods, chocolate, peppermint, colas, red wine, and acidic juices such as orange juice.  NO MINT OR MENTHOL PRODUCTS SO NO COUGH DROPS  USE SUGARLESS CANDY INSTEAD (Jolley ranchers or Stover's or Life Savers) or even ice chips will also do - the key is to swallow to prevent all throat clearing. NO OIL BASED VITAMINS - use powdered substitutes.  Avoid fish oil when coughing.   Only use your albuterol as a rescue medication to be used if you can't catch your breath by resting or doing a relaxed purse lip breathing pattern.  - The less you use it, the better it will work when you need it. - Ok to use up to 2 puffs  every 4 hours if you must but call for immediate appointment if use goes up over your usual need - Don't leave home without it !!  (think of it like the spare tire for your car)    Please schedule a follow up office visit in 6 weeks, call sooner if needed with all medications /inhalers/ solutions in hand so we can verify exactly what you are taking. This includes all medications from all doctors and over the counters        Called and spoke with pt who stated she has had problems with SOB and cough since last visit 5/19 and stated she did mention this to  MW at that visit. Pt states the symptoms began two months prior to her visit with MW.  Pt stated she was prescribed steroids and told to do delsym cough med and other meds per MW and pt stated that her symptoms have not improved with all the meds she has been taking. Pt stated she went to the mall the other day and when she was at the mall, she stated that it felt like her lungs feel like they are on fire and had to go back to the car.  Pt states her cough is a dry cough. Pt denies any complaints of fever, chest tightness, body aches or chills.  Pt stated that she is going to [facility] tomorrow morning and states she is going to try to get there around 9am to have COVID test performed.   Due to all this and with nothing helping, pt wants to know if there is something else that could be prescribed to help with her symptoms or if there is any other recommendations that MW can give her. Dr. Sherene Sires, please advise on this for pt. Thanks!

## 2018-07-05 NOTE — Telephone Encounter (Signed)
Spoke with patient, she stated that the test was ordered via [employer] since she works for [employer]. She will start to take the gabapentin TID as well as the Delsym cough syrup.   She also stated that because of her cough, she has been out of work. Her leave of absence ended on 07/03/18 and her PCP refuses to extend it. She stated that she is not able to work due to the cough and SOB. She wants to know if MW would be willing to extend her leave of absence to allow her to get the gabapentin in her system and get rid of the cough.   MW, please advise. Thanks!

## 2018-07-05 NOTE — Telephone Encounter (Signed)
We can extend of LOA until she comes to the office, but that should be by June 4 pm-ok to add on    End of any day for me if nothing else available

## 2018-07-05 NOTE — Telephone Encounter (Signed)
Should be able to get covid testing outside of ER visit (ask Lauren about this to see if we can help her get tested s er eval  )  but best medication to take for cough = gabapentin 300 tid (not as needed but let it build up until cough gone then can take as need) in addition to delsym cough syrup and if this not helping needs ov asap with all meds in hand to see me or NP

## 2018-07-05 NOTE — Telephone Encounter (Signed)
Left message for patient to call back  

## 2018-07-08 NOTE — Telephone Encounter (Signed)
Left message for patient to call back  

## 2018-07-08 NOTE — Telephone Encounter (Signed)
Pt is returning call. Pt states that she had the COVID-19 on Sat. 05/30 and is waiting.  Cb is [phone].

## 2018-07-09 NOTE — Telephone Encounter (Signed)
Appt scheduled 07/10/18@ 8:45 f/u cough.

## 2018-07-10 ENCOUNTER — Encounter: Payer: Self-pay | Admitting: Internal Medicine

## 2018-07-10 ENCOUNTER — Ambulatory Visit (INDEPENDENT_AMBULATORY_CARE_PROVIDER_SITE_OTHER): Payer: Self-pay

## 2018-07-10 ENCOUNTER — Ambulatory Visit (INDEPENDENT_AMBULATORY_CARE_PROVIDER_SITE_OTHER): Payer: Self-pay | Admitting: Internal Medicine

## 2018-07-10 ENCOUNTER — Ambulatory Visit: Payer: Self-pay

## 2018-07-10 ENCOUNTER — Encounter: Payer: Self-pay | Admitting: *Deleted

## 2018-07-10 ENCOUNTER — Other Ambulatory Visit: Payer: Self-pay

## 2018-07-10 DIAGNOSIS — R059 Cough, unspecified: Secondary | ICD-10-CM

## 2018-07-10 DIAGNOSIS — I10 Essential (primary) hypertension: Secondary | ICD-10-CM

## 2018-07-10 DIAGNOSIS — R05 Cough: Secondary | ICD-10-CM

## 2018-07-10 MED ORDER — TRAMADOL HCL 50 MG PO TABS: 50.0000 mg | ORAL_TABLET | ORAL | 2 refills | Status: AC | PRN

## 2018-07-10 MED ORDER — METHYLPREDNISOLONE ACETATE 80 MG/ML IJ SUSP
120.0000 mg | Freq: Once | INTRAMUSCULAR | Status: AC
  Administered 2018-07-10: 120 mg via INTRAMUSCULAR

## 2018-07-10 MED FILL — traMADol HCL 50 MG TABS: 50 | 7 days supply | Qty: 45 | Fill #0

## 2018-07-10 NOTE — Progress Notes (Signed)
Spoke with pt and notified of results per Dr. Wert. Pt verbalized understanding and denied any questions. 

## 2018-07-10 NOTE — Progress Notes (Signed)
Catherine Kane, female    DOB: February 22, 1970,     MRN: [id number]   Brief patient profile:  30 yobf never smoker ? JRA so not very active as child/young adult but no resp issues with 6 IUP's last in 2000 and baseline wt 185 with hbpp  1st child and maint on lisonopril ? since when?  and around 2005 noted onset cough dx of asthma in [place] with season change typically exac with uri  And in no need for any maint rx other protonix but then while on ppi April 25 2018 severe cough /sob refractory to saba hfa/neb/singulair/flonase/ zyrtec/no prednisone > put out of work since March 19th and not worked since and 4 ov's with HCP's  So self referred to pulmonary 06/25/2018      History of Present Illness  06/25/2018  Pulmonary/ 1st office eval/  Chief Complaint  Patient presents with  . Pulmonary Consult    Self referral. Pt c/o cough and increased SOB for the past several wks. Cough is non prod.   Dyspnea:  Mailbox and back prior to March 19 no problem, flat 50 ft ok now struggles /makes her cough Cough: dry/ day > noct more with movement , sitting fine, sleeping fine / speaking makes it worse / so does laughing/ blowing up balloon SABA use: 2 hfa's 1 neb no better / pred always helps some for a few days at leas rec Stop lisinopril and start micardis 40 mg one daily  Prednisone 10 mg take  4 each am x 2 days,   2 each am x 2 days,  1 each am x 2 days and stop  Change protonix to 40 mg Take 30-60 min before first meal of the day and pepcid 20 mg after supper until you return  Best cough syrup is delsym 2tsp every 12 hours as needed  GERD  Only use your albuterol as a rescue medication Please schedule a follow up office visit in 6 weeks, call sooner if needed with all medications /inhalers/ solutions in hand so we can verify exactly what you are taking. This includes all medications from all doctors and over the counters    07/10/2018  f/u ov/ re: severe cough not resp to gabapentin  300 tid and delsym  Chief Complaint  Patient presents with  . Follow-up    Patient states that her dry cough is about the same since last visit. She states that she uses her resue inhaler twice daily.  Dyspnea:  Mostly sob when coughing Cough: dry honking 24/7  Sleeping: on side wedge pillow  SABA use: albuterol not helping    No obvious day to day or daytime variability or assoc excess/ purulent sputum or mucus plugs or hemoptysis or cp or chest tightness, subjective wheeze or overt sinus or hb symptoms.     Also denies any obvious fluctuation of symptoms with weather or environmental changes or other aggravating or alleviating factors except as outlined above   No unusual exposure hx or h/o childhood pna/ asthma or knowledge of premature birth.  Current Allergies, Complete Past Medical History, Past Surgical History, Family History, and Social History were reviewed in [employer] record.  ROS  The following are not active complaints unless bolded Hoarseness, sore throat, dysphagia, dental problems, itching, sneezing,  nasal congestion or discharge of excess mucus or purulent secretions, ear ache,   fever, chills, sweats, unintended wt loss or wt gain, classically pleuritic or exertional cp,  orthopnea pnd  or arm/hand swelling  or leg swelling, presyncope, palpitations, abdominal pain, anorexia, nausea, vomiting, diarrhea  or change in bowel habits or change in bladder habits, change in stools or change in urine, dysuria, hematuria,  rash, arthralgias, visual complaints, headache, numbness, weakness or ataxia or problems with walking or coordination,  change in mood or  memory.        Current Meds  Medication Sig  . acyclovir (ZOVIRAX) 200 MG capsule Take 1 capsule (200 mg total) by mouth 2 (two) times daily.  Marland Kitchen albuterol (PROVENTIL HFA;VENTOLIN HFA) 108 (90 Base) MCG/ACT inhaler Inhale 2 puffs into the lungs every 6 (six) hours as needed for wheezing or shortness  of breath.  Marland Kitchen albuterol (PROVENTIL) (2.5 MG/3ML) 0.083% nebulizer solution Take 3 mLs (2.5 mg total) by nebulization every 6 (six) hours as needed for wheezing or shortness of breath.  . cetirizine (ZYRTEC) 10 MG tablet Take 1 tablet (10 mg total) by mouth daily.  Marland Kitchen Dextromethorphan-Menthol (DELSYM COUGH RELIEF MT) Use as directed in the mouth or throat as needed.  . famotidine (PEPCID) 20 MG tablet One at bedtime  . FLUoxetine (PROZAC) 10 MG tablet Take 1 tablet (10 mg total) by mouth daily.  Marland Kitchen gabapentin (NEURONTIN) 300 MG capsule Take 1 capsule (300 mg total) by mouth 3 (three) times daily.  . hydrochlorothiazide (HYDRODIURIL) 25 MG tablet Take 1 tablet (25 mg total) by mouth daily.  Marland Kitchen ibuprofen (ADVIL,MOTRIN) 800 MG tablet Take 1 tablet (800 mg total) by mouth every 8 (eight) hours as needed for mild pain or moderate pain.  . methocarbamol (ROBAXIN) 500 MG tablet Take 1 tablet (500 mg total) by mouth 2 (two) times daily as needed for muscle spasms.  . montelukast (SINGULAIR) 10 MG tablet Take 1 tablet (10 mg total) by mouth at bedtime.  . pantoprazole (PROTONIX) 40 MG tablet Take 1 tablet (40 mg total) by mouth daily. Take 30-60 min before first meal of the day  . telmisartan (MICARDIS) 40 MG tablet Take 1 tablet (40 mg total) by mouth daily.             Objective:     Wt Readings from Last 3 Encounters:  07/10/18 210 lb (95.3 kg)  06/25/18 208 lb 9.6 oz (94.6 kg)  05/31/18 200 lb (90.7 kg)     Vital signs reviewed - Note on arrival 02 sats  94% on RA and BP 144/90 did not take meds yet today   amb obese bf with cough early on insp > severe honking    HEENT: nl dentition, turbinates bilaterally, and oropharynx. Nl external ear canals without cough reflex   NECK :  without JVD/Nodes/TM/ nl carotid upstrokes bilaterally   LUNGS: no acc muscle use,  Nl contour chest which is clear to A and P bilaterally without cough on insp or exp maneuvers   CV:  RRR  no s3 or murmur or  increase in P2, and no edema   ABD:  soft and nontender with nl inspiratory excursion in the supine position. No bruits or organomegaly appreciated, bowel sounds nl  MS:  Nl gait/ ext warm without deformities, calf tenderness, cyanosis or clubbing No obvious joint restrictions   SKIN: warm and dry without lesions    NEURO:  alert, approp, nl sensorium with  no motor or cerebellar deficits apparent.         CXR PA and Lateral:   07/10/2018 :    I personally reviewed images and agree with radiology impression as  follows:   No active cardiopulmonary disease.      Assessment

## 2018-07-10 NOTE — Patient Instructions (Addendum)
No work for 2 weeks   depomedrol 120 mg IM today   Take delsym two tsp every 12 hours and supplement if needed with  tramadol 50 mg up to 2 every 4 hours to suppress the urge to cough. Swallowing water and/or using ice chips/non mint and menthol containing candies (such as lifesavers or sugarless jolly ranchers) are also effective.  You should rest your voice and avoid activities that you know make you cough.  Once you have eliminated the cough for 3 straight days try reducing the tramadol first,  then the delsym as tolerated.     For drainage / throat tickle try take CHLORPHENIRAMINE  4 mg  (Chlortab 4mg   at [employer] should be easiest to find in the green box)  take one every 4 hours as needed - available over the counter- may cause drowsiness so start with just a bedtime dose or two and see how you tolerate it before trying in daytime    Please remember to go to the  x-ray department  for your tests - we will call you with the results when they are available     Keep your previous appt

## 2018-07-11 ENCOUNTER — Encounter: Payer: Self-pay | Admitting: Internal Medicine

## 2018-07-11 NOTE — Assessment & Plan Note (Signed)
Onset cough March 2020 - d/c acei 06/25/2018 and max rx for gerd  - cyclical cough rx 07/10/2018   Of the three most common causes of  Sub-acute / recurrent or chronic cough, only one (GERD)  can actually contribute to/ trigger  the other two (asthma and post nasal drip syndrome)  and perpetuate the cylce of cough.  While not intuitively obvious, many patients with chronic low grade reflux do not cough until there is a primary insult that disturbs the protective epithelial barrier and exposes sensitive nerve endings.   This is typically viral but can due to PNDS and  either may apply here.      >>> The point is that once this occurs, it is difficult to eliminate the cycle  using anything but a maximally effective acid suppression regimen at least in the short run, accompanied by an appropriate diet to address non acid GERD and control  pnds with 1st gen H1 blockers per guidelines  / eliminate the cough itself for at least 3 days with tramadol and address any Th-2 inflammatory component (which can be caused by airway trauma by itself) with Depomedrol 120 mg IM today

## 2018-07-11 NOTE — Assessment & Plan Note (Signed)
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     Please see AVS for specific instructions unique to this visit that I personally wrote and verbalized to the the pt in detail and then reviewed with pt  by my nurse highlighting any  changes in therapy recommended at today's visit to their plan of care.

## 2018-07-19 ENCOUNTER — Telehealth: Payer: Self-pay | Admitting: Internal Medicine

## 2018-07-19 NOTE — Telephone Encounter (Signed)
Placed in Dr Wert's lookat 

## 2018-07-22 NOTE — Telephone Encounter (Signed)
Completed by Dr Melvyn Novas and given to [facility]

## 2018-07-22 NOTE — Telephone Encounter (Signed)
Rec'd completed fmla paperwork. Fwd to [employer] via interoffice mail -pr

## 2018-07-24 ENCOUNTER — Telehealth: Payer: Self-pay | Admitting: Internal Medicine

## 2018-07-24 MED FILL — traMADol HCL 50 MG TABS: 50 | 7 days supply | Qty: 45 | Fill #0

## 2018-07-24 NOTE — Telephone Encounter (Signed)
The plan was to see her back with all meds in hand before returning to work which is especially impt if still coughing

## 2018-07-24 NOTE — Telephone Encounter (Signed)
Patient called for work release note. States she is supposed to return to work tomorrow  07/25/18 but wanted to check with Dr. Melvyn Novas first since she still has hacky dry cough.  No new symptoms but continues persistent cough.    She will need a note to return tomorrow if he clears her.  Patient states she can come by and pick up the note today if Dr. Melvyn Novas approves.    Routed to Dr. Melvyn Novas for his recommendation on work note.   Dr. Melvyn Novas please advise if we can release patient back to work with simple work note. Thank you

## 2018-07-24 NOTE — Telephone Encounter (Signed)
Returned call to patient and scheduled f/u for return to work. Nothing further needed.

## 2018-07-25 ENCOUNTER — Encounter: Payer: Self-pay | Admitting: General Surgery

## 2018-07-25 ENCOUNTER — Other Ambulatory Visit: Payer: Self-pay

## 2018-07-25 ENCOUNTER — Ambulatory Visit (INDEPENDENT_AMBULATORY_CARE_PROVIDER_SITE_OTHER): Payer: Self-pay | Admitting: Internal Medicine

## 2018-07-25 ENCOUNTER — Encounter: Payer: Self-pay | Admitting: Internal Medicine

## 2018-07-25 DIAGNOSIS — R059 Cough, unspecified: Secondary | ICD-10-CM

## 2018-07-25 DIAGNOSIS — R05 Cough: Secondary | ICD-10-CM

## 2018-07-25 DIAGNOSIS — I10 Essential (primary) hypertension: Secondary | ICD-10-CM

## 2018-07-25 MED ORDER — FAMOTIDINE 20 MG PO TABS: ORAL_TABLET | ORAL | 11 refills | Status: DC

## 2018-07-25 MED ORDER — METHYLPREDNISOLONE ACETATE 80 MG/ML IJ SUSP
120.0000 mg | Freq: Once | INTRAMUSCULAR | Status: AC
  Administered 2018-07-25: 120 mg via INTRAMUSCULAR

## 2018-07-25 NOTE — Patient Instructions (Addendum)
For drainage / throat tickle try take CHLORPHENIRAMINE  4 mg  (Chlortab 4mg   at [employer] should be easiest to find in the green box)  take one every 4 hours as needed - available over the counter- may cause drowsiness so start with just a bedtime dose  X two (on hour before bed)  and see how you tolerate it before trying in daytime    Increase gabapentin to 300 mg 4 x daily   Increase pepcid to 10 mg x 2 x 1 hours before bedtime   depomedrol 120mg    Please remember to go to the lab department   for your tests - we will call you with the results when they are available.      Please schedule a follow up office visit in 2  weeks, sooner if needed

## 2018-07-25 NOTE — Progress Notes (Signed)
Catherine Kane, female    DOB: 1970-10-02,     MRN: [id number]   Brief patient profile:  4 yobf never smoker ? JRA so not very active as child/young adult but no resp issues with 6 IUP's last in 2000 and baseline wt 185 with hbpp  1st child and maint on lisonopril ? since when?  and around 2005 noted onset cough dx of asthma in [place] with season change typically exac with uri  And in no need for any maint rx other protonix but then while on ppi April 25 2018 severe cough /sob refractory to saba hfa/neb/singulair/flonase/ zyrtec/no prednisone > put out of work since March 19th and not worked since and 4 ov's with HCP's  So self referred to pulmonary 06/25/2018       History of Present Illness  06/25/2018  Pulmonary/ 1st office eval/Catherine Kane  Chief Complaint  Patient presents with  . Pulmonary Consult    Self referral. Pt c/o cough and increased SOB for the past several wks. Cough is non prod.   Dyspnea:  Mailbox and back prior to March 19 no problem, flat 50 ft ok now struggles /makes her cough Cough: dry/ day > noct more with movement , sitting fine, sleeping fine / speaking makes it worse / so does laughing/ blowing up balloon SABA use: 2 hfa's 1 neb no better / pred always helps some for a few days at leas rec Stop lisinopril and start micardis 40 mg one daily  Prednisone 10 mg take  4 each am x 2 days,   2 each am x 2 days,  1 each am x 2 days and stop  Change protonix to 40 mg Take 30-60 min before first meal of the day and pepcid 20 mg after supper until you return  Best cough syrup is delsym 2tsp every 12 hours as needed  GERD  Only use your albuterol as a rescue medication Please schedule a follow up office visit in 6 weeks, call sooner if needed with all medications /inhalers/ solutions in hand so we can verify exactly what you are taking. This includes all medications from all doctors and over the counters    07/10/2018  f/u ov/Catherine Kane re: severe cough not resp to gabapentin  300 tid and delsym  Chief Complaint  Patient presents with  . Follow-up    Patient states that her dry cough is about the same since last visit. She states that she uses her resue inhaler twice daily.  Dyspnea:  Mostly sob when coughing Cough: dry honking 24/7  Sleeping: on side wedge pillow  SABA use: albuterol not helping rec No work for 2 weeks  Depomedrol 120 mg IM today  Take delsym two tsp every 12 hours and supplement if needed with  tramadol 50 mg up to 2 every 4 hours to suppress the urge to cough. Once you have eliminated the cough for 3 straight days try reducing the tramadol first,  then the delsym as tolerated.   For drainage / throat tickle try take CHLORPHENIRAMINE  4 mg  (Chlortab 4mg   at [employer] should be easiest to find in the green box)  take one every 4 hours as needed      07/25/2018 acute extended ov/Catherine Kane re:  Severe cough off ace x 4 weeks Chief Complaint  Patient presents with  . Acute Visit    Increased cough over the past 4 days- non prod.   tickle when lie down only taking 1 h1  at hs (instructions were to take up to 2 ) no better with saba   Sleeping on wedge    Not limited by breathing from desired activities     No obvious day to day or daytime variability or assoc excess/ purulent sputum or mucus plugs or hemoptysis or cp or chest tightness, subjective wheeze or overt sinus or hb symptoms.    Also denies any obvious fluctuation of symptoms with weather or environmental changes or other aggravating or alleviating factors except as outlined above   No unusual exposure hx or h/o childhood pna/ asthma or knowledge of premature birth.  Current Allergies, Complete Past Medical History, Past Surgical History, Family History, and Social History were reviewed in [facility] electronic medical record.  ROS  The following are not active complaints unless bolded Hoarseness, sore throat, dysphagia, dental problems, itching, sneezing,  nasal  congestion or discharge of excess mucus or purulent secretions, ear ache,   fever, chills, sweats, unintended wt loss or wt gain, classically pleuritic or exertional cp,  orthopnea pnd or arm/hand swelling  or leg swelling, presyncope, palpitations, abdominal pain, anorexia, nausea, vomiting, diarrhea  or change in bowel habits or change in bladder habits, change in stools or change in urine, dysuria, hematuria,  rash, arthralgias, visual complaints, headache, numbness, weakness or ataxia or problems with walking or coordination,  change in mood or  memory.        Current Meds  Medication Sig  . albuterol (PROVENTIL HFA;VENTOLIN HFA) 108 (90 Base) MCG/ACT inhaler Inhale 2 puffs into the lungs every 6 (six) hours as needed for wheezing or shortness of breath.  Marland Kitchen. albuterol (PROVENTIL) (2.5 MG/3ML) 0.083% nebulizer solution Take 3 mLs (2.5 mg total) by nebulization every 6 (six) hours as needed for wheezing or shortness of breath.  Marland Kitchen. aspirin EC 81 MG tablet Take 81 mg by mouth daily.  . cetirizine (ZYRTEC) 10 MG tablet Take 1 tablet (10 mg total) by mouth daily.  Marland Kitchen. dextromethorphan (DELSYM) 30 MG/5ML liquid Take by mouth as needed for cough.  . Dextromethorphan-Menthol (DELSYM COUGH RELIEF MT) Use as directed in the mouth or throat as needed.  . famotidine-calcium carbonate-magnesium hydroxide (PEPCID COMPLETE) 10-800-165 MG chewable tablet Chew 1 tablet by mouth at bedtime.  . Ferrous Sulfate (IRON) 325 (65 Fe) MG TABS Take 1 tablet by mouth daily.  Marland Kitchen. FLUoxetine (PROZAC) 10 MG tablet Take 1 tablet (10 mg total) by mouth daily.  Marland Kitchen. gabapentin (NEURONTIN) 300 MG capsule Take 1 capsule (300 mg total) by mouth 3 (three) times daily.  . hydrochlorothiazide (HYDRODIURIL) 25 MG tablet Take 1 tablet (25 mg total) by mouth daily.  Marland Kitchen. HYDROcodone-homatropine (HYCODAN) 5-1.5 MG/5ML syrup Take 5 mLs by mouth every 6 (six) hours as needed for cough.  Marland Kitchen. ibuprofen (ADVIL,MOTRIN) 800 MG tablet Take 1 tablet (800 mg  total) by mouth every 8 (eight) hours as needed for mild pain or moderate pain.  . methocarbamol (ROBAXIN) 500 MG tablet Take 1 tablet (500 mg total) by mouth 2 (two) times daily as needed for muscle spasms.  . montelukast (SINGULAIR) 10 MG tablet Take 1 tablet (10 mg total) by mouth at bedtime.  . pantoprazole (PROTONIX) 40 MG tablet Take 1 tablet (40 mg total) by mouth daily. Take 30-60 min before first meal of the day  . telmisartan (MICARDIS) 40 MG tablet Take 1 tablet (40 mg total) by mouth daily.  . traMADol (ULTRAM) 50 MG tablet Take 50 mg by mouth every 6 (six) hours as  needed.           .      Objective:     07/25/2018        215   07/10/18 210 lb (95.3 kg)  06/25/18 208 lb 9.6 oz (94.6 kg)  05/31/18 200 lb (90.7 kg)    Pleasant amb bf honking dry upper airway cough   Vital signs reviewed - Note on arrival 02 sats  100% on RA    HEENT: nl dentition, turbinates bilaterally, and oropharynx. Nl external ear canals without cough reflex   NECK :  without JVD/Nodes/TM/ nl carotid upstrokes bilaterally   LUNGS: no acc muscle use,  Nl contour chest which is clear to A and P bilaterally without cough on insp or exp maneuvers   CV:  RRR  no s3 or murmur or increase in P2, and no edema   ABD:  soft and nontender with nl inspiratory excursion in the supine position. No bruits or organomegaly appreciated, bowel sounds nl  MS:  Nl gait/ ext warm without deformities, calf tenderness, cyanosis or clubbing No obvious joint restrictions   SKIN: warm and dry without lesions    NEURO:  alert, approp, nl sensorium with  no motor or cerebellar deficits apparent.      Labs ordered 07/25/2018  Allergy profile       Assessment

## 2018-07-26 LAB — RESPIRATORY ALLERGY PROFILE REGION II ~~LOC~~
Allergen, A. alternata, m6: <0.1 kU/L
Allergen, Cedar tree, t12: 0.39 kU/L — ABNORMAL HIGH
Allergen, Comm Silver Birch, t9: 0.26 kU/L — ABNORMAL HIGH
Allergen, Cottonwood, t14: 0.4 kU/L — ABNORMAL HIGH
Allergen, D pternoyssinus,d7: 2.27 kU/L — ABNORMAL HIGH
Allergen, Mouse Urine Protein, e78: <0.1 kU/L
Allergen, Mulberry, t76: 0.3 kU/L — ABNORMAL HIGH
Allergen, Oak,t7: 0.58 kU/L — ABNORMAL HIGH
Allergen, P. notatum, m1: <0.1 kU/L
Aspergillus fumigatus, m3: <0.1 kU/L
Bermuda Grass: 1.12 kU/L — ABNORMAL HIGH
Box Elder IgE: 0.61 kU/L — ABNORMAL HIGH
CLADOSPORIUM HERBARUM (M2) IGE: <0.1 kU/L
COMMON RAGWEED (SHORT) (W1) IGE: 0.53 kU/L — ABNORMAL HIGH
Cat Dander: <0.1 kU/L
Class: 0
Class: 0
Class: 0
Class: 0
Class: 0
Class: 0
Class: 0
Class: 0
Class: 0
Class: 1
Class: 1
Class: 1
Class: 1
Class: 1
Class: 1
Class: 1
Class: 1
Class: 1
Class: 1
Class: 2
Class: 2
Class: 2
Class: 2
Class: 3
Cockroach: 0.37 kU/L — ABNORMAL HIGH
D. farinae: 4.41 kU/L — ABNORMAL HIGH
Dog Dander: <0.1 kU/L
Elm IgE: 0.47 kU/L — ABNORMAL HIGH
IgE (Immunoglobulin E), Serum: 52 kU/L (ref <=114)
Johnson Grass: 1.36 kU/L — ABNORMAL HIGH
Pecan/Hickory Tree IgE: 0.36 kU/L — ABNORMAL HIGH
Rough Pigweed  IgE: 0.5 kU/L — ABNORMAL HIGH
Sheep Sorrel IgE: 0.56 kU/L — ABNORMAL HIGH
Timothy Grass: 2.36 kU/L — ABNORMAL HIGH

## 2018-07-26 LAB — CBC WITH DIFFERENTIAL/PLATELET
Basophils Absolute: 0.1 10*3/uL (ref 0.0–0.1)
Basophils Relative: 1.9 % (ref 0.0–3.0)
Eosinophils Absolute: 0.3 10*3/uL (ref 0.0–0.7)
Eosinophils Relative: 4.4 % (ref 0.0–5.0)
HCT: 43 % (ref 36.0–46.0)
Hemoglobin: 14.4 g/dL (ref 12.0–15.0)
Lymphocytes Relative: 30.1 % (ref 12.0–46.0)
Lymphs Abs: 2.1 10*3/uL (ref 0.7–4.0)
MCHC: 33.6 g/dL (ref 30.0–36.0)
MCV: 88.4 fl (ref 78.0–100.0)
Monocytes Absolute: 1.1 10*3/uL — ABNORMAL HIGH (ref 0.1–1.0)
Monocytes Relative: 15 % — ABNORMAL HIGH (ref 3.0–12.0)
Neutro Abs: 3.4 10*3/uL (ref 1.4–7.7)
Neutrophils Relative %: 48.6 % (ref 43.0–77.0)
Platelets: 358 10*3/uL (ref 150.0–400.0)
RBC: 4.86 Mil/uL (ref 3.87–5.11)
RDW: 13.3 % (ref 11.5–15.5)
WBC: 7 10*3/uL (ref 4.0–10.5)

## 2018-07-26 LAB — INTERPRETATION:

## 2018-07-27 ENCOUNTER — Encounter: Payer: Self-pay | Admitting: Internal Medicine

## 2018-07-27 NOTE — Assessment & Plan Note (Signed)
Onset cough March 2020 - d/c acei 06/25/2018 and max rx for gerd  - cyclical cough rx 0/06/6977  - Allergy profile 07/25/2018 >  Eos 0.3 /  IgE 52 with RAST pos ragweed, grass, trees dust    Lack of cough resolution on a verified empirical regimen could mean an alternative diagnosis, persistence of the disease state (eg sinusitis or bronchiectasis) , or inadequacy of currently available therapy (eg no medical rx available for non-acid gerd)   >>> always a concern in chronic coughers where cough induces reflux induces cough even if acid is adequately suppressed as may be the case here.     She is only using pepcid 10 mg hs and h1 x 4 mg so rec double the doses of both and increase gabapentin to 300 mg qid with f/u in 2 weeks with all meds in hand using a trust but verify approach to confirm accurate Medication  Reconciliation The principal here is that until we are certain that the  patients are doing what we've asked, it makes no sense to ask them to do more.

## 2018-07-27 NOTE — Assessment & Plan Note (Signed)
Changed acei to arb 06/25/2018 due to cough/ pseudoasthma    Adequate control on present rx, reviewed in detail with pt > no change in rx needed     I had an extended discussion with the patient reviewing all relevant studies completed to date and  lasting 15 to 20 minutes of a 25 minute visit    Each maintenance medication was reviewed in detail including most importantly the difference between maintenance and prns and under what circumstances the prns are to be triggered using an action plan format that is not reflected in the computer generated alphabetically organized AVS.     Please see AVS for specific instructions unique to this visit that I personally wrote and verbalized to the the pt in detail and then reviewed with pt  by my nurse highlighting any  changes in therapy recommended at today's visit to their plan of care.

## 2018-07-29 NOTE — Progress Notes (Signed)
Spoke with pt and notified of results per Dr. Wert. Pt verbalized understanding and denied any questions. 

## 2018-07-31 ENCOUNTER — Telehealth: Payer: Self-pay | Admitting: Internal Medicine

## 2018-07-31 NOTE — Telephone Encounter (Signed)
Given to patrice  

## 2018-07-31 NOTE — Telephone Encounter (Signed)
Done

## 2018-07-31 NOTE — Telephone Encounter (Signed)
Placed in Dr Wert's lookat 

## 2018-08-01 NOTE — Telephone Encounter (Signed)
Rec'd completed paperwork - fwd to [employer] via interoffice mail -pr  

## 2018-08-06 ENCOUNTER — Ambulatory Visit: Payer: Self-pay | Admitting: Internal Medicine

## 2018-08-08 ENCOUNTER — Encounter: Payer: Self-pay | Admitting: Internal Medicine

## 2018-08-08 ENCOUNTER — Other Ambulatory Visit: Payer: Self-pay

## 2018-08-08 ENCOUNTER — Ambulatory Visit (INDEPENDENT_AMBULATORY_CARE_PROVIDER_SITE_OTHER): Payer: Self-pay | Admitting: Internal Medicine

## 2018-08-08 DIAGNOSIS — I10 Essential (primary) hypertension: Secondary | ICD-10-CM

## 2018-08-08 DIAGNOSIS — R059 Cough, unspecified: Secondary | ICD-10-CM

## 2018-08-08 DIAGNOSIS — R05 Cough: Secondary | ICD-10-CM

## 2018-08-08 MED FILL — CETIRIZINE HCL 10 MG TABS: 10 | 30 days supply | Qty: 30 | Fill #2

## 2018-08-08 MED FILL — PANTOPRAZOLE SOD DR 40 MG T: 40 | 30 days supply | Qty: 30 | Fill #1

## 2018-08-08 MED FILL — FLUoxetine HCL 10 MG CAPS: 10 | 30 days supply | Qty: 30 | Fill #1

## 2018-08-08 MED FILL — TELMISARTAN 40 MG TABLET: 40 | 30 days supply | Qty: 30 | Fill #1

## 2018-08-08 NOTE — Patient Instructions (Signed)
No change in medications    Ok to return to work as planned on July 17th   Please schedule a follow up visit in 3 months but call sooner if needed

## 2018-08-08 NOTE — Progress Notes (Signed)
Catherine Kane, female    DOB: 1970-10-02,     MRN: [id number]   Brief patient profile:  4 yobf never smoker ? JRA so not very active as child/young adult but no resp issues with 6 IUP's last in 2000 and baseline wt 185 with hbpp  1st child and maint on lisonopril ? since when?  and around 2005 noted onset cough dx of asthma in [place] with season change typically exac with uri  And in no need for any maint rx other protonix but then while on ppi April 25 2018 severe cough /sob refractory to saba hfa/neb/singulair/flonase/ zyrtec/no prednisone > put out of work since March 19th and not worked since and 4 ov's with HCP's  So self referred to pulmonary 06/25/2018       History of Present Illness  06/25/2018  Pulmonary/ 1st office eval/Catherine Kane  Chief Complaint  Patient presents with  . Pulmonary Consult    Self referral. Pt c/o cough and increased SOB for the past several wks. Cough is non prod.   Dyspnea:  Mailbox and back prior to March 19 no problem, flat 50 ft ok now struggles /makes her cough Cough: dry/ day > noct more with movement , sitting fine, sleeping fine / speaking makes it worse / so does laughing/ blowing up balloon SABA use: 2 hfa's 1 neb no better / pred always helps some for a few days at leas rec Stop lisinopril and start micardis 40 mg one daily  Prednisone 10 mg take  4 each am x 2 days,   2 each am x 2 days,  1 each am x 2 days and stop  Change protonix to 40 mg Take 30-60 min before first meal of the day and pepcid 20 mg after supper until you return  Best cough syrup is delsym 2tsp every 12 hours as needed  GERD  Only use your albuterol as a rescue medication Please schedule a follow up office visit in 6 weeks, call sooner if needed with all medications /inhalers/ solutions in hand so we can verify exactly what you are taking. This includes all medications from all doctors and over the counters    07/10/2018  f/u ov/Catherine Kane re: severe cough not resp to gabapentin  300 tid and delsym  Chief Complaint  Patient presents with  . Follow-up    Patient states that her dry cough is about the same since last visit. She states that she uses her resue inhaler twice daily.  Dyspnea:  Mostly sob when coughing Cough: dry honking 24/7  Sleeping: on side wedge pillow  SABA use: albuterol not helping rec No work for 2 weeks  Depomedrol 120 mg IM today  Take delsym two tsp every 12 hours and supplement if needed with  tramadol 50 mg up to 2 every 4 hours to suppress the urge to cough. Once you have eliminated the cough for 3 straight days try reducing the tramadol first,  then the delsym as tolerated.   For drainage / throat tickle try take CHLORPHENIRAMINE  4 mg  (Chlortab 4mg   at [facility] [facility] should be easiest to find in the green box)  take one every 4 hours as needed      07/25/2018 acute extended ov/Catherine Kane re:  Severe cough off ace x 4 weeks Chief Complaint  Patient presents with  . Acute Visit    Increased cough over the past 4 days- non prod.   tickle when lie down only taking 1 h1  at hs (instructions were to take up to 2 ) no better with saba  Sleeping on wedge   Not limited by breathing from desired activities   rec For drainage / throat tickle try take CHLORPHENIRAMINE  4 mg  Increase gabapentin to 300 mg 4 x daily  Increase pepcid to 10 mg x 2 x 1 hours before bedtime  depomedrol 120 mg    08/08/2018  f/u ov/Catherine Kane re: off ace x 6 weeks  maint on singulair /  Improving cough  Chief Complaint  Patient presents with  . Follow-up    Cough has improved some.    Dyspnea:  Limited by back not breath Cough: 95% better, did cough with grass exp/laughing w/in one week of ov  Sleeping: fine on wedge  SABA use: none 02: none    No obvious day to day or daytime variability or assoc excess/ purulent sputum or mucus plugs or hemoptysis or cp or chest tightness, subjective wheeze or overt sinus or hb symptoms.   Sleeping  without nocturnal  or early am  exacerbation  of respiratory  c/o's or need for noct saba. Also denies any obvious fluctuation of symptoms with weather or environmental changes or other aggravating or alleviating factors except as outlined above   No unusual exposure hx or h/o childhood pna/ asthma or knowledge of premature birth.  Current Allergies, Complete Past Medical History, Past Surgical History, Family History, and Social History were reviewed in [facility] electronic medical record.  ROS  The following are not active complaints unless bolded Hoarseness, sore throat, dysphagia, dental problems, itching, sneezing,  nasal congestion or discharge of excess mucus or purulent secretions, ear ache,   fever, chills, sweats, unintended wt loss or wt gain, classically pleuritic or exertional cp,  orthopnea pnd or arm/hand swelling  or leg swelling, presyncope, palpitations, abdominal pain, anorexia, nausea, vomiting, diarrhea  or change in bowel habits or change in bladder habits, change in stools or change in urine, dysuria, hematuria,  rash, arthralgias, visual complaints, headache, numbness, weakness or ataxia or problems with walking or coordination,  change in mood or  memory.        Current Meds  Medication Sig  . albuterol (PROVENTIL HFA;VENTOLIN HFA) 108 (90 Base) MCG/ACT inhaler Inhale 2 puffs into the lungs every 6 (six) hours as needed for wheezing or shortness of breath.  Marland Kitchen. albuterol (PROVENTIL) (2.5 MG/3ML) 0.083% nebulizer solution Take 3 mLs (2.5 mg total) by nebulization every 6 (six) hours as needed for wheezing or shortness of breath.  Marland Kitchen. aspirin EC 81 MG tablet Take 81 mg by mouth daily.  . cetirizine (ZYRTEC) 10 MG tablet Take 1 tablet (10 mg total) by mouth daily.  Marland Kitchen. Dextromethorphan-Menthol (DELSYM COUGH RELIEF MT) Use as directed in the mouth or throat as needed.  . famotidine (PEPCID) 20 MG tablet One after supper  . Ferrous Sulfate (IRON) 325 (65 Fe) MG TABS Take 1 tablet by mouth daily.  Marland Kitchen. FLUoxetine  (PROZAC) 10 MG tablet Take 1 tablet (10 mg total) by mouth daily.  Marland Kitchen. gabapentin (NEURONTIN) 300 MG capsule Take 1 capsule (300 mg total) by mouth 3 (three) times daily.  . hydrochlorothiazide (HYDRODIURIL) 25 MG tablet Take 1 tablet (25 mg total) by mouth daily.  Marland Kitchen. ibuprofen (ADVIL,MOTRIN) 800 MG tablet Take 1 tablet (800 mg total) by mouth every 8 (eight) hours as needed for mild pain or moderate pain.  . methocarbamol (ROBAXIN) 500 MG tablet Take 1 tablet (500 mg total) by  mouth 2 (two) times daily as needed for muscle spasms.  . montelukast (SINGULAIR) 10 MG tablet Take 1 tablet (10 mg total) by mouth at bedtime.  . pantoprazole (PROTONIX) 40 MG tablet Take 1 tablet (40 mg total) by mouth daily. Take 30-60 min before first meal of the day  . telmisartan (MICARDIS) 40 MG tablet Take 1 tablet (40 mg total) by mouth daily.  . traMADol (ULTRAM) 50 MG tablet Take 50 mg by mouth every 6 (six) hours as needed.               .      Objective:    08/08/2018          213  07/25/2018        215   07/10/18 210 lb (95.3 kg)  06/25/18 208 lb 9.6 oz (94.6 kg)  05/31/18 200 lb (90.7 kg)    amb bf nad/ no more honking  Vital signs reviewed - Note on arrival 02 sats  98% on RA and bp 154/96 but no am meds      HEENT: nl dentition, turbinates bilaterally, and oropharynx. Nl external ear canals without cough reflex   NECK :  without JVD/Nodes/TM/ nl carotid upstrokes bilaterally   LUNGS: no acc muscle use,  Nl contour chest which is clear to A and P bilaterally without cough on insp or exp maneuvers   CV:  RRR  no s3 or murmur or increase in P2, and no edema   ABD:  soft and nontender with nl inspiratory excursion in the supine position. No bruits or organomegaly appreciated, bowel sounds nl  MS:  Nl gait/ ext warm without deformities, calf tenderness, cyanosis or clubbing No obvious joint restrictions   SKIN: warm and dry without lesions    NEURO:  alert, approp, nl sensorium with  no  motor or cerebellar deficits apparent.                Assessment

## 2018-08-09 ENCOUNTER — Encounter: Payer: Self-pay | Admitting: Internal Medicine

## 2018-08-09 NOTE — Assessment & Plan Note (Signed)
Changed acei to arb 06/25/2018 due to cough/ pseudoasthma> 95% better 08/08/2018    She did not take her bp meds yet this am but appears Adequate control when takes  present rx, reviewed in detail with pt > no change in rx needed    NB: Although even in retrospect it may not be clear the ACEi contributed to the pt's symptoms,  Pt improved off them and adding them back at this point or in the future would risk confusion in interpretation of non-specific respiratory symptoms to which this patient is prone  ie  Better not to muddy the waters here.    I had an extended discussion with the patient reviewing all relevant studies completed to date and  lasting 15 to 20 minutes of a 25 minute visit    Each maintenance medication was reviewed in detail including most importantly the difference between maintenance and prns and under what circumstances the prns are to be triggered using an action plan format that is not reflected in the computer generated alphabetically organized AVS.     Please see AVS for specific instructions unique to this visit that I personally wrote and verbalized to the the pt in detail and then reviewed with pt  by my nurse highlighting any  changes in therapy recommended at today's visit to their plan of care.

## 2018-08-09 NOTE — Assessment & Plan Note (Addendum)
Onset cough March 2020 - d/c acei 06/25/2018 and max rx for gerd  - cyclical cough rx 10/13/9148  - Allergy profile 07/25/2018 >  Eos 0.3 /  IgE 52 with RAST pos ragweed, grass, trees dust  - 08/08/2018  95% improved > no change rx      Ok to return to work as planned but no change in rx for now until cough is 100% resolved.

## 2018-08-20 ENCOUNTER — Ambulatory Visit: Payer: Self-pay | Admitting: Family Medicine

## 2018-08-20 ENCOUNTER — Ambulatory Visit: Payer: Self-pay | Admitting: Internal Medicine

## 2018-08-26 ENCOUNTER — Telehealth: Payer: Self-pay | Admitting: Internal Medicine

## 2018-08-26 ENCOUNTER — Encounter: Payer: Self-pay | Admitting: *Deleted

## 2018-08-26 NOTE — Telephone Encounter (Signed)
Called and spoke to pt. Pt states she returned to work on 7/18 and she states 3 hours into her shift she felt her cough and SOB were getting worse. Pt works for [employer]. 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 I advised pt that though it may be uncomfortable she still getting the needed O2 to breathe. Pt repeated that her SOB and cough are far worse in those rooms due to the PPE she has to wear. She is requesting a letter from Dr. Melvyn Novas excusing her from [facility] rooms.   Dr. Melvyn Novas please advise. Thanks.

## 2018-08-26 NOTE — Telephone Encounter (Signed)
Fine with me:  Due to ongoing symptoms brought on by using PPE, I recommend this employee avoid avoid exposure to rooms where COVID level PPE is required (rooms with airborne precautions).

## 2018-08-26 NOTE — Telephone Encounter (Signed)
Spoke with the pt and notified of recs per MW  She wants to pick up the letter  I have left this up front her to pick up   She will call for appt if not improving

## 2018-08-26 NOTE — Telephone Encounter (Signed)
Called and spoke to pt. Pt requesting the letter now be faxed. Confirmed fax number with pt. Letter was obtained from up front and faxed to number listed in the 'contacts' tab, confirmation fax received. Pt verbalized understanding and denied any further questions or concerns at this time.

## 2018-08-28 ENCOUNTER — Telehealth: Payer: Self-pay | Admitting: Internal Medicine

## 2018-08-28 NOTE — Telephone Encounter (Addendum)
Spoke with the pt  She states that she is unable to work due to to her cough and SOB  She feels like she should be improving by now and is not  In person visit tomorrow with all meds in hand  Covid screen neg

## 2018-08-29 ENCOUNTER — Encounter: Payer: Self-pay | Admitting: Internal Medicine

## 2018-08-29 ENCOUNTER — Ambulatory Visit (INDEPENDENT_AMBULATORY_CARE_PROVIDER_SITE_OTHER): Payer: Self-pay | Admitting: Internal Medicine

## 2018-08-29 ENCOUNTER — Other Ambulatory Visit: Payer: Self-pay

## 2018-08-29 DIAGNOSIS — R059 Cough, unspecified: Secondary | ICD-10-CM

## 2018-08-29 DIAGNOSIS — I10 Essential (primary) hypertension: Secondary | ICD-10-CM

## 2018-08-29 DIAGNOSIS — R05 Cough: Secondary | ICD-10-CM

## 2018-08-29 MED ORDER — BUDESONIDE-FORMOTEROL FUMARATE 80-4.5 MCG/ACT IN AERO: 2.0000 | INHALATION_SPRAY | Freq: Two times a day (BID) | RESPIRATORY_TRACT | 0 refills | Status: DC

## 2018-08-29 MED ORDER — METHYLPREDNISOLONE ACETATE 80 MG/ML IJ SUSP
120.0000 mg | Freq: Once | INTRAMUSCULAR | Status: AC
  Administered 2018-08-29: 120 mg via INTRAMUSCULAR

## 2018-08-29 NOTE — Progress Notes (Signed)
Catherine Kane, female    DOB: 02/14/1970,     MRN: [id number]   Brief patient profile:  58 yobf never smoker ? 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 since when?  and around 2005 noted onset cough dx of asthma in [place] with season change typically exac with uri  And in no need for any maint rx other protonix but then while on ppi April 25 2018 severe cough /sob refractory to saba hfa/neb/singulair/flonase/ zyrtec/no prednisone > put out of work since March 19th and not worked since and 4 ov's with HCP's  So self referred to pulmonary 06/25/2018       History of Present Illness  06/25/2018  Pulmonary/ 1st office eval/Catherine Kane  Chief Complaint  Patient presents with  . Pulmonary Consult    Self referral. Pt c/o cough and increased SOB for the past several wks. Cough is non prod.   Dyspnea:  Mailbox and back prior to March 19 no problem, flat 50 ft ok now struggles /makes her cough Cough: dry/ day > noct more with movement , sitting fine, sleeping fine / speaking makes it worse / so does laughing/ blowing up balloon SABA use: 2 hfa's 1 neb no better / pred always helps some for a few days at leas rec Stop lisinopril and start micardis 40 mg one daily  Prednisone 10 mg take  4 each am x 2 days,   2 each am x 2 days,  1 each am x 2 days and stop  Change protonix to 40 mg Take 30-60 min before first meal of the day and pepcid 20 mg after supper until you return  Best cough syrup is delsym 2tsp every 12 hours as needed  GERD  Only use your albuterol as a rescue medication Please schedule a follow up office visit in 6 weeks, call sooner if needed with all medications /inhalers/ solutions in hand so we can verify exactly what you are taking. This includes all medications from all doctors and over the counters    07/10/2018  f/u ov/Catherine Kane re: severe cough not resp to gabapentin  300 tid and delsym  Chief Complaint  Patient presents with  . Follow-up    Patient states that her dry cough is about the same since last visit. She states that she uses her resue inhaler twice daily.  Dyspnea:  Mostly sob when coughing Cough: dry honking 24/7  Sleeping: on side wedge pillow  SABA use: albuterol not helping rec No work for 2 weeks  Depomedrol 120 mg IM today  Take delsym two tsp every 12 hours and supplement if needed with  tramadol 50 mg up to 2 every 4 hours to suppress the urge to cough. Once you have eliminated the cough for 3 straight days try reducing the tramadol first,  then the delsym as tolerated.   For drainage / throat tickle try take CHLORPHENIRAMINE  4 mg  (Chlortab 4mg   at [facility] should be easiest to find in the green box)  take one every 4 hours as needed      07/25/2018 acute extended ov/Catherine Kane re:  Severe cough off ace x 4 weeks Chief Complaint  Patient presents with  . Acute Visit    Increased cough over the past 4 days- non prod.   tickle when lie down only taking 1  h1 at hs (instructions were to take up to 2 ) no better with saba  Sleeping on wedge   Not limited by breathing from desired activities   rec For drainage / throat tickle try take CHLORPHENIRAMINE  4 mg  Increase gabapentin to 300 mg 4 x daily  Increase pepcid to 10 mg x 2 x 1 hours before bedtime  depomedrol 120 mg    08/08/2018  f/u ov/Catherine Kane re: off ace x 6 weeks  maint on singulair /  Improving cough  Chief Complaint  Patient presents with  . Follow-up    Cough has improved some.   Dyspnea:  Limited by back not breath Cough: 95% better, did cough with grass exp/laughing w/in one week of ov  Sleeping: fine on wedge  SABA use: none 02: none  rec No change rx  Return to July 18th, coughing same day working than she does at home    08/29/2018  f/u ov/Catherine Kane re: uacs vs cough variant asthma Chief Complaint  Patient presents with  . Acute Visit    Pt states she is  continuing to have dry cough and SOB. She states she can not work at all due to these symptoms.   Dyspnea:  MMRC3 = can't walk 100 yards even at a slow pace at a flat grade s stopping due to sob  /back pain  Cough: harsh honking no mucus Sleeping: on wedge/ recliner worse flat coughing/ gasping SABA use: albterol not helping  02: none  Reports better p depomedrol rx   No obvious day to day or daytime variability or assoc excess/ purulent sputum or mucus plugs or hemoptysis or cp or chest tightness, subjective wheeze or overt sinus or hb symptoms.     Also denies any obvious fluctuation of symptoms with weather or environmental changes or other aggravating or alleviating factors except as outlined above   No unusual exposure hx or h/o childhood pna/ asthma or knowledge of premature birth.  Current Allergies, Complete Past Medical History, Past Surgical History, Family History, and Social History were reviewed in [facility] electronic medical record.  ROS  The following are not active complaints unless bolded Hoarseness, sore throat, dysphagia, dental problems, itching, sneezing,  nasal congestion or discharge of excess mucus or purulent secretions, ear ache,   fever, chills, sweats, unintended wt loss or wt gain, classically pleuritic or exertional cp,  orthopnea pnd or arm/hand swelling  or leg swelling, presyncope, palpitations, abdominal pain, anorexia, nausea, vomiting, diarrhea  or change in bowel habits or change in bladder habits, change in stools or change in urine, dysuria, hematuria,  rash, arthralgias/back pain, visual complaints, headache, numbness, weakness or ataxia or problems with walking or coordination,  change in mood or  memory.        Current Meds  Medication Sig  . albuterol (PROVENTIL HFA;VENTOLIN HFA) 108 (90 Base) MCG/ACT inhaler Inhale 2 puffs into the lungs every 6 (six) hours as needed for wheezing or shortness of breath.  Marland Kitchen. albuterol (PROVENTIL) (2.5 MG/3ML)  0.083% nebulizer solution Take 3 mLs (2.5 mg total) by nebulization every 6 (six) hours as needed for wheezing or shortness of breath.  Marland Kitchen. aspirin EC 81 MG tablet Take 81 mg by mouth daily.  . cetirizine (ZYRTEC) 10 MG tablet Take 1 tablet (10 mg total) by mouth daily.  Marland Kitchen. Dextromethorphan-Menthol (DELSYM COUGH RELIEF MT) Use as directed in the mouth or throat as needed.  . famotidine (PEPCID) 20 MG tablet One after supper  .  Ferrous Sulfate (IRON) 325 (65 Fe) MG TABS Take 1 tablet by mouth daily.  Marland Kitchen. FLUoxetine (PROZAC) 10 MG tablet Take 1 tablet (10 mg total) by mouth daily.  Marland Kitchen. gabapentin (NEURONTIN) 300 MG capsule Take 1 capsule (300 mg total) by mouth 3 (three) times daily.  . hydrochlorothiazide (HYDRODIURIL) 25 MG tablet Take 1 tablet (25 mg total) by mouth daily.  Marland Kitchen. ibuprofen (ADVIL,MOTRIN) 800 MG tablet Take 1 tablet (800 mg total) by mouth every 8 (eight) hours as needed for mild pain or moderate pain.  . methocarbamol (ROBAXIN) 500 MG tablet Take 1 tablet (500 mg total) by mouth 2 (two) times daily as needed for muscle spasms.  . montelukast (SINGULAIR) 10 MG tablet Take 1 tablet (10 mg total) by mouth at bedtime.  . pantoprazole (PROTONIX) 40 MG tablet Take 1 tablet (40 mg total) by mouth daily. Take 30-60 min before first meal of the day  . telmisartan (MICARDIS) 40 MG tablet Take 1 tablet (40 mg total) by mouth daily.  . traMADol (ULTRAM) 50 MG tablet Take 50 mg by mouth every 6 (six) hours as needed.              Objective:     08/29/2018        214  08/08/2018          213  07/25/2018        215   07/10/18 210 lb (95.3 kg)  06/25/18 208 lb 9.6 oz (94.6 kg)  05/31/18 200 lb (90.7 kg)     amb bf with honking cough   Vital signs reviewed - Note on arrival 02 sats  96% on RA    HEENT: nl dentition, turbinates bilaterally, and oropharynx. Nl external ear canals without cough reflex   NECK :  without JVD/Nodes/TM/ nl carotid upstrokes bilaterally   LUNGS: no acc muscle  use,  Nl contour chest which is clear to A and P bilaterally without cough on insp or exp maneuvers   CV:  RRR  no s3 or murmur or increase in P2, and no edema   ABD:  soft and nontender with nl inspiratory excursion in the supine position. No bruits or organomegaly appreciated, bowel sounds nl  MS:  Nl gait/ ext warm without deformities, calf tenderness, cyanosis or clubbing No obvious joint restrictions   SKIN: warm and dry without lesions    NEURO:  alert, approp, nl sensorium with  no motor or cerebellar deficits apparent.                 Assessment

## 2018-08-29 NOTE — Patient Instructions (Addendum)
Depomedrol 120 mg IM   For drainage / throat tickle try take CHLORPHENIRAMINE  4 mg  (Chlortab 4mg   at [facility] [facility] should be easiest to find in the green box)  take one every 4 hours as needed - available over the counter- may cause drowsiness so start with just a bedtime dose or two and see how you tolerate it before trying in daytime    Symbicort 80 Take 2 puffs first thing in am and then another 2 puffs about 12 hours later.     Please schedule a follow up office visit in 2 weeks, sooner if needed  with all medications /inhalers/ solutions in hand so we can verify exactly what you are taking. This includes all medications from all doctors and over the counters

## 2018-08-30 ENCOUNTER — Ambulatory Visit: Payer: Self-pay | Admitting: Family Medicine

## 2018-08-30 ENCOUNTER — Encounter: Payer: Self-pay | Admitting: Internal Medicine

## 2018-08-30 NOTE — Assessment & Plan Note (Signed)
Onset cough March 2020 - d/c acei 06/25/2018 and max rx for gerd  - cyclical cough rx 02/11/6061  - Allergy profile 07/25/2018 >  Eos 0.3 /  IgE 52 with RAST pos ragweed, grass, trees dust       - 08/29/2018  After extensive coaching inhaler device,  effectiveness =    75% try symb 80 2bid x 2 weeks then return to regroup  Still not clear how much is asthma vs uacs but insists the Depomedrol helps so worth repeating but this time start symb 80 2bid and max rx with 1st gen H1 blockers per guidelines  For UACS/ continue singualir, gabapentin and gerd rx    >>> return in 2 weeks with all meds in hand using a trust but verify approach to confirm accurate Medication  Reconciliation The principal here is that until we are certain that the  patients are doing what we've asked, it makes no sense to ask them to do more.     I had an extended discussion with the patient reviewing all relevant studies completed to date and  lasting 15 to 20 minutes of a 25 minute visit    I performed detailed device teaching using a teach back method which extended face to face time for this visit (see above)  Each maintenance medication was reviewed in detail including emphasizing most importantly the difference between maintenance and prns and under what circumstances the prns are to be triggered using an action plan format that is not reflected in the computer generated alphabetically organized AVS which I have not found useful in most complex patients, especially with respiratory illnesses  Please see AVS for specific instructions unique to this visit that I personally wrote and verbalized to the the pt in detail and then reviewed with pt  by my nurse highlighting any  changes in therapy recommended at today's visit to their plan of care.

## 2018-08-30 NOTE — Assessment & Plan Note (Signed)
Changed acei to arb 06/25/2018 due to cough/ pseudoasthma   Adequate control on present rx, reviewed in detail with pt > no change in rx needed

## 2018-09-02 ENCOUNTER — Telehealth: Payer: Self-pay | Admitting: Internal Medicine

## 2018-09-02 NOTE — Telephone Encounter (Signed)
Call returned to patient, she states she was calling while she was at [facility] to be sure everyone was on the same page. She was letting us know that the paperwork needs to state what her precautions were and the dates for the two weeks he took her out (08/29/2018-09/12/2018). I made her aware we would await the paperwork. Voiced understanding.   Will route message to nurse to hold until we receive paper work.

## 2018-09-04 NOTE — Telephone Encounter (Signed)
Patrice, please advise if paperwork has been received yet from [facility] on pt for MW to complete.

## 2018-09-05 NOTE — Telephone Encounter (Signed)
Rec'd accommodation paperwork via interoffice mail from [facility] - will hold until Monday when Dr. Melvyn Novas returns to clinic - fwd to [place] to inform her I have the paperwork.  -pr

## 2018-09-12 ENCOUNTER — Ambulatory Visit: Payer: Self-pay | Admitting: Internal Medicine

## 2018-09-13 NOTE — Telephone Encounter (Signed)
Rec'd completed paperwork - Fwd to [facility] via interoffice mail -pr  °

## 2018-09-16 MED FILL — traMADol HCL 50 MG TABS: 50 | 7 days supply | Qty: 45 | Fill #1

## 2018-09-17 ENCOUNTER — Ambulatory Visit (INDEPENDENT_AMBULATORY_CARE_PROVIDER_SITE_OTHER): Payer: Self-pay | Admitting: Internal Medicine

## 2018-09-17 ENCOUNTER — Encounter: Payer: Self-pay | Admitting: Internal Medicine

## 2018-09-17 ENCOUNTER — Ambulatory Visit: Payer: Self-pay | Admitting: Family Medicine

## 2018-09-17 ENCOUNTER — Other Ambulatory Visit: Payer: Self-pay

## 2018-09-17 DIAGNOSIS — M549 Dorsalgia, unspecified: Secondary | ICD-10-CM

## 2018-09-17 DIAGNOSIS — M5431 Sciatica, right side: Secondary | ICD-10-CM

## 2018-09-17 DIAGNOSIS — R05 Cough: Secondary | ICD-10-CM

## 2018-09-17 DIAGNOSIS — R059 Cough, unspecified: Secondary | ICD-10-CM

## 2018-09-17 DIAGNOSIS — R0602 Shortness of breath: Secondary | ICD-10-CM

## 2018-09-17 MED ORDER — ACETAMINOPHEN-CODEINE #3 300-30 MG PO TABS: 1.0000 | ORAL_TABLET | ORAL | 0 refills | Status: DC | PRN

## 2018-09-17 MED ORDER — GABAPENTIN 300 MG PO CAPS: 300.0000 mg | ORAL_CAPSULE | Freq: Four times a day (QID) | ORAL | 3 refills | Status: DC

## 2018-09-17 MED ORDER — METHYLPREDNISOLONE ACETATE 80 MG/ML IJ SUSP
120.0000 mg | Freq: Once | INTRAMUSCULAR | Status: AC
  Administered 2018-09-17: 16:00:00 120 mg via INTRAMUSCULAR

## 2018-09-17 NOTE — Assessment & Plan Note (Addendum)
Onset March 2020 assoc with UACS 09/17/2018   Walked RA  2 laps @  approx 269ft each @ avg  pace  stopped due to  End of study, sob and cough but sats still 98%    Continue to feel the cough is what is driving the doe symptoms so focus on that first and as above and return in 2 weeks with all meds in hand using a trust but verify approach to confirm accurate Medication  Reconciliation The principal here is that until we are certain that the  patients are doing what we've asked, it makes no sense to ask them to do more.     I had an extended discussion with the patient   reviewing all relevant studies completed to date and  lasting 15 to 20 minutes of a 25 minute visit  which included directly observing ambulatory 02 saturation study documented in a/p section of  today's  office note.  Each maintenance medication was reviewed in detail including most importantly the difference between maintenance and prns and under what circumstances the prns are to be triggered using an action plan format that is not reflected in the computer generated alphabetically organized AVS.     Please see AVS for specific instructions unique to this visit that I personally wrote and verbalized to the the pt in detail and then reviewed with pt  by my nurse highlighting any changes in therapy recommended at today's visit .

## 2018-09-17 NOTE — Progress Notes (Signed)
Catherine Kane, female    DOB: 02/14/1970,     MRN: [id number]   Brief patient profile:  58 yobf never smoker ? 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 since when?  and around 2005 noted onset cough dx of asthma in [place] with season change typically exac with uri  And in no need for any maint rx other protonix but then while on ppi April 25 2018 severe cough /sob refractory to saba hfa/neb/singulair/flonase/ zyrtec/no prednisone > put out of work since March 19th and not worked since and 4 ov's with HCP's  So self referred to pulmonary 06/25/2018       History of Present Illness  06/25/2018  Pulmonary/ 1st office eval/Catherine Kane  Chief Complaint  Patient presents with  . Pulmonary Consult    Self referral. Pt c/o cough and increased SOB for the past several wks. Cough is non prod.   Dyspnea:  Mailbox and back prior to March 19 no problem, flat 50 ft ok now struggles /makes her cough Cough: dry/ day > noct more with movement , sitting fine, sleeping fine / speaking makes it worse / so does laughing/ blowing up balloon SABA use: 2 hfa's 1 neb no better / pred always helps some for a few days at leas rec Stop lisinopril and start micardis 40 mg one daily  Prednisone 10 mg take  4 each am x 2 days,   2 each am x 2 days,  1 each am x 2 days and stop  Change protonix to 40 mg Take 30-60 min before first meal of the day and pepcid 20 mg after supper until you return  Best cough syrup is delsym 2tsp every 12 hours as needed  GERD  Only use your albuterol as a rescue medication Please schedule a follow up office visit in 6 weeks, call sooner if needed with all medications /inhalers/ solutions in hand so we can verify exactly what you are taking. This includes all medications from all doctors and over the counters    07/10/2018  f/u ov/Catherine Kane re: severe cough not resp to gabapentin  300 tid and delsym  Chief Complaint  Patient presents with  . Follow-up    Patient states that her dry cough is about the same since last visit. She states that she uses her resue inhaler twice daily.  Dyspnea:  Mostly sob when coughing Cough: dry honking 24/7  Sleeping: on side wedge pillow  SABA use: albuterol not helping rec No work for 2 weeks  Depomedrol 120 mg IM today  Take delsym two tsp every 12 hours and supplement if needed with  tramadol 50 mg up to 2 every 4 hours to suppress the urge to cough. Once you have eliminated the cough for 3 straight days try reducing the tramadol first,  then the delsym as tolerated.   For drainage / throat tickle try take CHLORPHENIRAMINE  4 mg  (Chlortab 4mg   at [facility] should be easiest to find in the green box)  take one every 4 hours as needed      07/25/2018 acute extended ov/Catherine Kane re:  Severe cough off ace x 4 weeks Chief Complaint  Patient presents with  . Acute Visit    Increased cough over the past 4 days- non prod.   tickle when lie down only taking 1  h1 at hs (instructions were to take up to 2 ) no better with saba  Sleeping on wedge   Not limited by breathing from desired activities   rec For drainage / throat tickle try take CHLORPHENIRAMINE  4 mg  Increase gabapentin to 300 mg 4 x daily  Increase pepcid to 10 mg x 2 x 1 hours before bedtime  depomedrol 120 mg    08/08/2018  f/u ov/Catherine Kane re: off ace x 6 weeks  maint on singulair /  Improving cough  Chief Complaint  Patient presents with  . Follow-up    Cough has improved some.   Dyspnea:  Limited by back not breath Cough: 95% better, did cough with grass exp/laughing w/in one week of ov  Sleeping: fine on wedge  SABA use: none 02: none  rec No change rx  Return to July 18th, coughing same day working than she does at home    08/29/2018  f/u ov/Catherine Kane re: uacs vs cough variant asthma Chief Complaint  Patient presents with  . Acute Visit    Pt states she is  continuing to have dry cough and SOB. She states she can not work at all due to these symptoms.   Dyspnea:  MMRC3 = can't walk 100 yards even at a slow pace at a flat grade s stopping due to sob  /back pain  Cough: harsh honking no mucus Sleeping: on wedge/ recliner worse flat coughing/ gasping SABA use: albterol not helping  02: none  Reports better p depomedrol rx  rec Depomedrol 120 mg IM  For drainage / throat tickle try take CHLORPHENIRAMINE  4 mg  (Chlortab 4mg   at [facility] should be easiest to find in the green box)  take one every 4 hours as needed - available over the counter- may cause drowsiness so start with just a bedtime dose or two and see how you tolerate it before trying in daytime   Symbicort 80 Take 2 puffs first thing in am and then another 2 puffs about 12 hours later.  Please schedule a follow up office visit in 2 weeks, sooner if needed  with all medications /inhalers/ solutions in hand so we can verify exactly what you are taking. This includes all medications from all doctors and over the counters    09/17/2018  f/u ov/Catherine Kane re:  Prior March 2020 no need any resp rx / still tickle in throat / avg tramadol 5 per day  Chief Complaint  Patient presents with  . Follow-up    doing ok "as long as I don't move"  Dyspnea:  50 ft even if not coughing  Cough: dry honking sound  Sleeping: wedge x 30 degrees since early March 2020 /  SABA use: albuterol not helping not even neb  02: none    No obvious day to day or daytime variability or assoc excess/ purulent sputum or mucus plugs or hemoptysis or cp or chest tightness, subjective wheeze or overt sinus or hb symptoms.   Sleeping as above  without nocturnal  or early am exacerbation  of respiratory  c/o's or need for noct saba. Also denies any obvious fluctuation of symptoms with weather or environmental changes or other aggravating or alleviating factors except as outlined above   No unusual exposure hx or h/o  childhood pna/ asthma or knowledge of premature birth.  Current Allergies, Complete Past Medical History, Past Surgical History, Family History, and Social History were reviewed in [facility] electronic medical record.  ROS  The following are not active complaints unless bolded Hoarseness, sore throat, dysphagia, dental problems, itching, sneezing,  nasal congestion or discharge of excess mucus or purulent secretions, ear ache,   fever, chills, sweats, unintended wt loss or wt gain, classically pleuritic or exertional cp,  orthopnea pnd or arm/hand swelling  or leg swelling, presyncope, palpitations, abdominal pain, anorexia, nausea, vomiting, diarrhea  or change in bowel habits or change in bladder habits, change in stools or change in urine, dysuria, hematuria,  rash, arthralgias, visual complaints, headache, numbness, weakness or ataxia or problems with walking or coordination,  change in mood or  memory.        Current Meds  Medication Sig  . albuterol (PROVENTIL HFA;VENTOLIN HFA) 108 (90 Base) MCG/ACT inhaler Inhale 2 puffs into the lungs every 6 (six) hours as needed for wheezing or shortness of breath.  Marland Kitchen albuterol (PROVENTIL) (2.5 MG/3ML) 0.083% nebulizer solution Take 3 mLs (2.5 mg total) by nebulization every 6 (six) hours as needed for wheezing or shortness of breath.  Marland Kitchen aspirin EC 81 MG tablet Take 81 mg by mouth daily.  . budesonide-formoterol (SYMBICORT) 80-4.5 MCG/ACT inhaler Inhale 2 puffs into the lungs 2 (two) times daily.  . cetirizine (ZYRTEC) 10 MG tablet Take 1 tablet (10 mg total) by mouth daily.  Marland Kitchen Dextromethorphan-Menthol (DELSYM COUGH RELIEF MT) Use as directed in the mouth or throat as needed.  . famotidine (PEPCID) 20 MG tablet One after supper  . Ferrous Sulfate (IRON) 325 (65 Fe) MG TABS Take 1 tablet by mouth daily.  Marland Kitchen FLUoxetine (PROZAC) 10 MG tablet Take 1 tablet (10 mg total) by mouth daily.  Marland Kitchen gabapentin (NEURONTIN) 300 MG capsule Take 1 capsule (300 mg  total) by mouth 3 (three) times daily.  . hydrochlorothiazide (HYDRODIURIL) 25 MG tablet Take 1 tablet (25 mg total) by mouth daily.  Marland Kitchen ibuprofen (ADVIL,MOTRIN) 800 MG tablet Take 1 tablet (800 mg total) by mouth every 8 (eight) hours as needed for mild pain or moderate pain.  . methocarbamol (ROBAXIN) 500 MG tablet Take 1 tablet (500 mg total) by mouth 2 (two) times daily as needed for muscle spasms.  . montelukast (SINGULAIR) 10 MG tablet Take 1 tablet (10 mg total) by mouth at bedtime.  . pantoprazole (PROTONIX) 40 MG tablet Take 1 tablet (40 mg total) by mouth daily. Take 30-60 min before first meal of the day  . telmisartan (MICARDIS) 40 MG tablet Take 1 tablet (40 mg total) by mouth daily.  . traMADol (ULTRAM) 50 MG tablet Take 50 mg by mouth every 6 (six) hours as needed.                 Objective:    09/17/2018        215  08/29/2018        214  08/08/2018          213  07/25/2018        215   07/10/18 210 lb (95.3 kg)  06/25/18 208 lb 9.6 oz (94.6 kg)  05/31/18 200 lb (90.7 kg)    amb bf still some honking cough/ lots of throat clearing   Vital signs reviewed - Note on arrival 02 sats  99% on RA      HEENT: nl dentition, turbinates bilaterally, and oropharynx. Nl external ear canals without cough reflex   NECK :  without JVD/Nodes/TM/ nl carotid upstrokes bilaterally   LUNGS: no acc muscle use,  Nl contour chest which is clear  to A and P bilaterally without cough on insp or exp maneuvers   CV:  RRR  no s3 or murmur or increase in P2, and no edema   ABD:  Obese but soft and nontender with nl inspiratory excursion in the supine position. No bruits or organomegaly appreciated, bowel sounds nl  MS:  Nl gait/ ext warm without deformities, calf tenderness, cyanosis or clubbing No obvious joint restrictions   SKIN: warm and dry without lesions    NEURO:  alert, approp, nl sensorium with  no motor or cerebellar deficits apparent.                 Assessment

## 2018-09-17 NOTE — Assessment & Plan Note (Addendum)
Onset cough March 2020 - d/c acei 06/25/2018 and max rx for gerd  - cyclical cough rx 03/15/5168  - Allergy profile 07/25/2018 >  Eos 0.3 /  IgE 52 with RAST pos ragweed, grass, trees dust  - 08/29/2018  After extensive coaching inhaler device,  effectiveness =    75% try symb 80 2bid x 2 weeks > no improvement in cough or doe so d/c 09/17/2018   Lack of cough resolution on a verified empirical regimen could mean an alternative diagnosis, persistence of the disease state (eg sinusitis or bronchiectasis) , or inadequacy of currently available therapy (eg no medical rx available for non-acid gerd)   >>> always a concern in chronic coughers where cough induces reflux induces cough even if acid is adequately suppressed as may be the case here.    In fact, Of the three most common causes of  Sub-acute / recurrent or chronic cough, only one (GERD)  can actually contribute to/ trigger  the other two (asthma and post nasal drip syndrome)  and perpetuate the cylce of cough.  While not intuitively obvious, many patients with chronic low grade reflux do not cough until there is a primary insult that disturbs the protective epithelial barrier and exposes sensitive nerve endings.   This is typically viral but can due to PNDS and  either may apply here.   >>> The point is that once this occurs, it is difficult to eliminate the cycle  using anything but a maximally effective acid suppression regimen at least in the short run, accompanied by an appropriate diet to address non acid GERD and control / eliminate the cough itself for at least 3 days with tyl #3 and 1st gen H1 blockers per guidelines  And  Also added depomedrol 120 mg IM in  case of component of Th-2 driven upper or lower airways inflammation (if cough responds short term only to relapse before return while will on rx for uacs that would point to allergic rhinitis/ asthma or eos bronchitis which I think are much less likely at this point with no response to  symb/singulair)

## 2018-09-17 NOTE — Patient Instructions (Signed)
Depomedrol 120 mg IM today and stop symbicort  Take delsym two tsp every 12 hours and supplement if needed with  Tylenol #3   up to 1-2 every 4 hours to suppress the urge to cough. Swallowing water and/or using ice chips/non mint and menthol containing candies (such as lifesavers or sugarless jolly ranchers) are also effective.  You should rest your voice and avoid activities that you know make you cough.  Once you have eliminated the cough for 3 straight days try reducing the Tylenol #3 first,  then the delsym as tolerated.     Continue gabapentin 300 mg four times daily   Please schedule a follow up office visit in 2 weeks, sooner if needed  with all medications /inhalers/ solutions in hand so we can verify exactly what you are taking. This includes all medications from all doctors and over the counters

## 2018-09-18 ENCOUNTER — Telehealth: Payer: Self-pay | Admitting: Internal Medicine

## 2018-09-18 NOTE — Telephone Encounter (Signed)
[facility], line continued to ring with no VM. Since it is after 5pm there may be no one available. Will call back on 8/13.

## 2018-09-19 NOTE — Telephone Encounter (Signed)
Patient Instructions by Tanda Rockers, MD at 09/17/2018 3:15 PM Author: Tanda Rockers, MD Author Type: Physician Filed: 09/17/2018 3:39 PM  Note Status: Signed Cosign: Cosign Not Required Encounter Date: 09/17/2018  Editor: Tanda Rockers, MD (Physician)    Depomedrol 120 mg IM today and stop symbicort  Take delsym two tsp every 12 hours and supplement if needed with  Tylenol #3   up to 1-2 every 4 hours to suppress the urge to cough. Swallowing water and/or using ice chips/non mint and menthol containing candies (such as lifesavers or sugarless jolly ranchers) are also effective.  You should rest your voice and avoid activities that you know make you cough.  Once you have eliminated the cough for 3 straight days try reducing the Tylenol #3 first,  then the delsym as tolerated.     Continue gabapentin 300 mg four times daily   Please schedule a follow up office visit in 2 weeks, sooner if needed  with all medications /inhalers/ solutions in hand so we can verify exactly what you are taking. This includes all medications from all doctors and over the counters      Attempted to call [place]. Someone picked up the phone and without saying anything immediately placed me on hold. Was on hold for a long time so hung up. Will try to call back later.

## 2018-09-20 NOTE — Telephone Encounter (Signed)
I called [facility] but they were closed for lunch. Will try again later.

## 2018-09-23 MED FILL — ACETAMINOPHEN/COD #3 TABLET: 300-30 | 3 days supply | Qty: 40 | Fill #0

## 2018-09-23 MED FILL — IBUPROFEN 800 MG TABLET: 800 | 10 days supply | Qty: 30 | Fill #0

## 2018-09-23 MED FILL — GABAPENTIN 300 MG CAPSULE: 300 | 30 days supply | Qty: 120 | Fill #0

## 2018-09-23 MED FILL — PANTOPRAZOLE SOD DR 40 MG T: 40 | 30 days supply | Qty: 30 | Fill #0

## 2018-09-23 NOTE — Telephone Encounter (Signed)
[place] and spoke with Thomasena Edis in regards to the meds that pt had recently prescribed by MW. Thomasena Edis wanted to make sure that we were fine for these meds to be filled for pt and I stated to him that it is okay to prescribe the recent meds ordered by MW as he does order these to help treat a cough especially if pt has had a cough for awhile now. Thomasena Edis verbalized understanding. Nothing further needed.

## 2018-09-30 ENCOUNTER — Ambulatory Visit (INDEPENDENT_AMBULATORY_CARE_PROVIDER_SITE_OTHER): Payer: Self-pay | Admitting: Family Medicine

## 2018-09-30 ENCOUNTER — Other Ambulatory Visit: Payer: Self-pay

## 2018-09-30 VITALS — BP 179/114 | HR 75 | Temp 98.1°F | Ht 63.0 in | Wt 221.4 lb

## 2018-09-30 DIAGNOSIS — M5431 Sciatica, right side: Secondary | ICD-10-CM

## 2018-09-30 DIAGNOSIS — J449 Chronic obstructive pulmonary disease, unspecified: Secondary | ICD-10-CM

## 2018-09-30 DIAGNOSIS — R0602 Shortness of breath: Secondary | ICD-10-CM

## 2018-09-30 DIAGNOSIS — M62838 Other muscle spasm: Secondary | ICD-10-CM

## 2018-09-30 DIAGNOSIS — I10 Essential (primary) hypertension: Secondary | ICD-10-CM

## 2018-09-30 DIAGNOSIS — R059 Cough, unspecified: Secondary | ICD-10-CM

## 2018-09-30 DIAGNOSIS — R05 Cough: Secondary | ICD-10-CM

## 2018-09-30 DIAGNOSIS — Z09 Encounter for follow-up examination after completed treatment for conditions other than malignant neoplasm: Secondary | ICD-10-CM

## 2018-09-30 MED ORDER — AMLODIPINE BESYLATE 5 MG PO TABS: 5.0000 mg | ORAL_TABLET | Freq: Every day | ORAL | 1 refills | Status: DC

## 2018-09-30 MED ORDER — MONTELUKAST SODIUM 10 MG PO TABS: 10.0000 mg | ORAL_TABLET | Freq: Every day | ORAL | 3 refills | Status: DC

## 2018-09-30 MED ORDER — METHOCARBAMOL 500 MG PO TABS: 500.0000 mg | ORAL_TABLET | Freq: Two times a day (BID) | ORAL | 2 refills | Status: DC | PRN

## 2018-09-30 MED ORDER — TELMISARTAN 40 MG PO TABS: 40.0000 mg | ORAL_TABLET | Freq: Every day | ORAL | 11 refills | Status: DC

## 2018-09-30 MED ORDER — GABAPENTIN 300 MG PO CAPS: ORAL_CAPSULE | ORAL | 3 refills | Status: DC

## 2018-09-30 MED FILL — MONTELUKAST SOD 10 MG TAB: 10 | 30 days supply | Qty: 30 | Fill #0

## 2018-09-30 MED FILL — METHOCARBAMOL 500 MG TABS: 500 | 30 days supply | Qty: 60 | Fill #0

## 2018-09-30 MED FILL — TELMISARTAN 40 MG TABS: 40 | 30 days supply | Qty: 30 | Fill #0

## 2018-09-30 NOTE — Patient Instructions (Signed)
Amlodipine tablets What is this medicine? AMLODIPINE (am LOE di peen) is a calcium-channel blocker. It affects the amount of calcium found in your heart and muscle cells. This relaxes your blood vessels, which can reduce the amount of work the heart has to do. This medicine is used to lower high blood pressure. It is also used to prevent chest pain. This medicine may be used for other purposes; ask your health care provider or pharmacist if you have questions. COMMON BRAND NAME(S): Norvasc What should I tell my health care provider before I take this medicine? They need to know if you have any of these conditions:  heart disease  liver disease  an unusual or allergic reaction to amlodipine, other medicines, foods, dyes, or preservatives  pregnant or trying to get pregnant  breast-feeding How should I use this medicine? Take this medicine by mouth with a glass of water. Follow the directions on the prescription label. You can take it with or without food. If it upsets your stomach, take it with food. Take your medicine at regular intervals. Do not take it more often than directed. Do not stop taking except on your doctor's advice. Talk to your pediatrician regarding the use of this medicine in children. While this drug may be prescribed for children as young as 6 years for selected conditions, precautions do apply. Patients over 65 years of age may have a stronger reaction and need a smaller dose. Overdosage: If you think you have taken too much of this medicine contact a poison control center or emergency room at once. NOTE: This medicine is only for you. Do not share this medicine with others. What if I miss a dose? If you miss a dose, take it as soon as you can. If it is almost time for your next dose, take only that dose. Do not take double or extra doses. What may interact with this medicine? Do not take this medicine with any of the following medications:  tranylcypromine This  medicine may also interact with the following medications:  clarithromycin  cyclosporine  diltiazem  itraconazole  simvastatin  tacrolimus This list may not describe all possible interactions. Give your health care provider a list of all the medicines, herbs, non-prescription drugs, or dietary supplements you use. Also tell them if you smoke, drink alcohol, or use illegal drugs. Some items may interact with your medicine. What should I watch for while using this medicine? Visit your healthcare professional for regular checks on your progress. Check your blood pressure as directed. Ask your healthcare professional what your blood pressure should be and when you should contact him or her. Do not treat yourself for coughs, colds, or pain while you are using this medicine without asking your healthcare professional for advice. Some medicines may increase your blood pressure. You may get dizzy. Do not drive, use machinery, or do anything that needs mental alertness until you know how this medicine affects you. Do not stand or sit up quickly, especially if you are an older patient. This reduces the risk of dizzy or fainting spells. Avoid alcoholic drinks; they can make you dizzier. What side effects may I notice from receiving this medicine? Side effects that you should report to your doctor or health care professional as soon as possible:  allergic reactions like skin rash, itching or hives; swelling of the face, lips, or tongue  fast, irregular heartbeat  signs and symptoms of low blood pressure like dizziness; feeling faint or lightheaded, falls; unusually weak   or tired  swelling of ankles, feet, hands Side effects that usually do not require medical attention (report these to your doctor or health care professional if they continue or are bothersome):  dry mouth  facial flushing  headache  stomach pain  tiredness This list may not describe all possible side effects. Call your  doctor for medical advice about side effects. You may report side effects to FDA at 1-800-FDA-1088. Where should I keep my medicine? Keep out of the reach of children. Store at room temperature between 59 and 86 degrees F (15 and 30 degrees C). Throw away any unused medicine after the expiration date. NOTE: This sheet is a summary. It may not cover all possible information. If you have questions about this medicine, talk to your doctor, pharmacist, or health care provider.  2020 Elsevier/Gold Standard (2017-08-17 15:07:10) Hypertension, Adult Hypertension is another name for high blood pressure. High blood pressure forces your heart to work harder to pump blood. This can cause problems over time. There are two numbers in a blood pressure reading. There is a top number (systolic) over a bottom number (diastolic). It is best to have a blood pressure that is below 120/80. Healthy choices can help lower your blood pressure, or you may need medicine to help lower it. What are the causes? The cause of this condition is not known. Some conditions may be related to high blood pressure. What increases the risk?  Smoking.  Having type 2 diabetes mellitus, high cholesterol, or both.  Not getting enough exercise or physical activity.  Being overweight.  Having too much fat, sugar, calories, or salt (sodium) in your diet.  Drinking too much alcohol.  Having long-term (chronic) kidney disease.  Having a family history of high blood pressure.  Age. Risk increases with age.  Race. You may be at higher risk if you are African American.  Gender. Men are at higher risk than women before age 32. After age 55, women are at higher risk than men.  Having obstructive sleep apnea.  Stress. What are the signs or symptoms?  High blood pressure may not cause symptoms. Very high blood pressure (hypertensive crisis) may cause: ? Headache. ? Feelings of worry or nervousness (anxiety). ? Shortness of  breath. ? Nosebleed. ? A feeling of being sick to your stomach (nausea). ? Throwing up (vomiting). ? Changes in how you see. ? Very bad chest pain. ? Seizures. How is this treated?  This condition is treated by making healthy lifestyle changes, such as: ? Eating healthy foods. ? Exercising more. ? Drinking less alcohol.  Your health care provider may prescribe medicine if lifestyle changes are not enough to get your blood pressure under control, and if: ? Your top number is above 130. ? Your bottom number is above 80.  Your personal target blood pressure may vary. Follow these instructions at home: Eating and drinking   If told, follow the DASH eating plan. To follow this plan: ? Fill one half of your plate at each meal with fruits and vegetables. ? Fill one fourth of your plate at each meal with whole grains. Whole grains include whole-wheat pasta, brown rice, and whole-grain bread. ? Eat or drink low-fat dairy products, such as skim milk or low-fat yogurt. ? Fill one fourth of your plate at each meal with low-fat (lean) proteins. Low-fat proteins include fish, chicken without skin, eggs, beans, and tofu. ? Avoid fatty meat, cured and processed meat, or chicken with skin. ? Avoid pre-made  or processed food.  Eat less than 1,500 mg of salt each day.  Do not drink alcohol if: ? Your doctor tells you not to drink. ? You are pregnant, may be pregnant, or are planning to become pregnant.  If you drink alcohol: ? Limit how much you use to:  0-1 drink a day for women.  0-2 drinks a day for men. ? Be aware of how much alcohol is in your drink. In the [place] one drink equals one 12 oz bottle of beer (355 mL), one 5 oz glass of wine (148 mL), or one 1 oz glass of hard liquor (44 mL). Lifestyle   Work with your doctor to stay at a healthy weight or to lose weight. Ask your doctor what the best weight is for you.  Get at least 30 minutes of exercise most days of the week. This  may include walking, swimming, or biking.  Get at least 30 minutes of exercise that strengthens your muscles (resistance exercise) at least 3 days a week. This may include lifting weights or doing Pilates.  Do not use any products that contain nicotine or tobacco, such as cigarettes, e-cigarettes, and chewing tobacco. If you need help quitting, ask your doctor.  Check your blood pressure at home as told by your doctor.  Keep all follow-up visits as told by your doctor. This is important. Medicines  Take over-the-counter and prescription medicines only as told by your doctor. Follow directions carefully.  Do not skip doses of blood pressure medicine. The medicine does not work as well if you skip doses. Skipping doses also puts you at risk for problems.  Ask your doctor about side effects or reactions to medicines that you should watch for. Contact a doctor if you:  Think you are having a reaction to the medicine you are taking.  Have headaches that keep coming back (recurring).  Feel dizzy.  Have swelling in your ankles.  Have trouble with your vision. Get help right away if you:  Get a very bad headache.  Start to feel mixed up (confused).  Feel weak or numb.  Feel faint.  Have very bad pain in your: ? Chest. ? Belly (abdomen).  Throw up more than once.  Have trouble breathing. Summary  Hypertension is another name for high blood pressure.  High blood pressure forces your heart to work harder to pump blood.  For most people, a normal blood pressure is less than 120/80.  Making healthy choices can help lower blood pressure. If your blood pressure does not get lower with healthy choices, you may need to take medicine. This information is not intended to replace advice given to you by your health care provider. Make sure you discuss any questions you have with your health care provider. Document Released: 07/12/2007 Document Revised: 10/03/2017 Document Reviewed:  10/03/2017 Elsevier Patient Education  2020 [employer]

## 2018-09-30 NOTE — Progress Notes (Signed)
[facility]   Established Patient Office Visit  Subjective:  Patient ID: Catherine Kane, female    DOB: 07/25/1970  Age: 48 y.o. MRN: [id number]  CC:  Chief Complaint  Patient presents with  . Follow-up    having pain in breast, numbness in right leg     HPI [name] is a 48 year old female who presents for Follow Up today.   Past Medical History:  Diagnosis Date  . Asthma   . Bronchitis   . Chronic obstructive pulmonary disease (HCC) 05/03/2018  . Hypertension    Current Status: Since her last office visit, she is doing well with no complaints. Her blood pressure is elevated today. She states that she has not taken her blood pressure medications as of yet today. She states that her home blood pressures are usually 140s over 90s. She has c/o left tender breast mass X 1 month. She has never had a Mammogram. She denies fevers, chills, fatigue, recent infections, weight loss, and night sweats. She has not had any visual changes, and falls. No chest pain, heart palpitations, cough and shortness of breath reported. No reports of GI problems such as nausea, vomiting, diarrhea, and constipation. She has no reports of blood in stools, dysuria and hematuria. No depression or anxiety reported today. She denies pain today.   Past Surgical History:  Procedure Laterality Date  . TONSILLECTOMY      Family History  Problem Relation Age of Onset  . Heart disease Mother   . Heart disease Sister     Social History   Socioeconomic History  . Marital status: Single    Spouse name: Not on file  . Number of children: Not on file  . Years of education: Not on file  . Highest education level: Not on file  Occupational History  . Not on file  Social Needs  . Financial resource strain: Not on file  . Food insecurity    Worry: Not on file    Inability: Not on file  . Transportation needs    Medical: Not on  file    Non-medical: Not on file  Tobacco Use  . Smoking status: Never Smoker  . Smokeless tobacco: Never Used  Substance and Sexual Activity  . Alcohol use: No  . Drug use: No  . Sexual activity: Yes  Lifestyle  . Physical activity    Days per week: Not on file    Minutes per session: Not on file  . Stress: Not on file  Relationships  . Social Musicianconnections    Talks on phone: Not on file    Gets together: Not on file    Attends religious service: Not on file    Active member of club or organization: Not on file    Attends meetings of clubs or organizations: Not on file    Relationship status: Not on file  . Intimate partner violence    Fear of current or ex partner: Not on file    Emotionally abused: Not on file    Physically abused: Not on file    Forced sexual activity: Not on file  Other Topics Concern  . Not on file  Social History Narrative  . Not on file    Outpatient Medications Prior to Visit  Medication Sig Dispense Refill  . acetaminophen-codeine (TYLENOL #3) 300-30 MG tablet Take 1-2 tablets by mouth every 4 (four) hours as needed (cough). 40 tablet  0  . aspirin EC 81 MG tablet Take 81 mg by mouth daily.    Marland Kitchen. Dextromethorphan-Menthol (DELSYM COUGH RELIEF MT) Use as directed in the mouth or throat as needed.    . Ferrous Sulfate (IRON) 325 (65 Fe) MG TABS Take 1 tablet by mouth daily.    Marland Kitchen. FLUoxetine (PROZAC) 10 MG tablet Take 1 tablet (10 mg total) by mouth daily. 30 tablet 3  . gabapentin (NEURONTIN) 300 MG capsule Take 1 capsule (300 mg total) by mouth 4 (four) times daily. 120 capsule 3  . hydrochlorothiazide (HYDRODIURIL) 25 MG tablet Take 1 tablet (25 mg total) by mouth daily. 30 tablet 3  . ibuprofen (ADVIL,MOTRIN) 800 MG tablet Take 1 tablet (800 mg total) by mouth every 8 (eight) hours as needed for mild pain or moderate pain. 30 tablet 3  . methocarbamol (ROBAXIN) 500 MG tablet Take 1 tablet (500 mg total) by mouth 2 (two) times daily as needed for muscle  spasms. 60 tablet 2  . montelukast (SINGULAIR) 10 MG tablet Take 1 tablet (10 mg total) by mouth at bedtime. 30 tablet 3  . pantoprazole (PROTONIX) 40 MG tablet Take 1 tablet (40 mg total) by mouth daily. Take 30-60 min before first meal of the day 30 tablet 2  . telmisartan (MICARDIS) 40 MG tablet Take 1 tablet (40 mg total) by mouth daily. 30 tablet 11  . traMADol (ULTRAM) 50 MG tablet Take 50 mg by mouth every 6 (six) hours as needed.    Marland Kitchen. albuterol (PROVENTIL HFA;VENTOLIN HFA) 108 (90 Base) MCG/ACT inhaler Inhale 2 puffs into the lungs every 6 (six) hours as needed for wheezing or shortness of breath. (Patient not taking: Reported on 09/30/2018) 1 Inhaler 11  . albuterol (PROVENTIL) (2.5 MG/3ML) 0.083% nebulizer solution Take 3 mLs (2.5 mg total) by nebulization every 6 (six) hours as needed for wheezing or shortness of breath. (Patient not taking: Reported on 09/30/2018) 150 mL 11  . famotidine (PEPCID) 20 MG tablet One after supper (Patient not taking: Reported on 09/30/2018) 30 tablet 11   No facility-administered medications prior to visit.     No Known Allergies  ROS Review of Systems  Constitutional: Negative.   HENT: Negative.   Eyes: Negative.   Respiratory: Positive for cough.   Cardiovascular: Negative.   Gastrointestinal: Positive for abdominal distention.  Endocrine: Negative.   Genitourinary: Negative.   Musculoskeletal: Negative.   Skin: Negative.   Allergic/Immunologic: Negative.   Neurological: Positive for dizziness (occasional).  Hematological: Negative.   Psychiatric/Behavioral: Negative.       Objective:    Physical Exam  Constitutional: She is oriented to person, place, and time. She appears well-developed and well-nourished.  HENT:  Head: Normocephalic and atraumatic.  Eyes: Conjunctivae are normal.  Neck: Normal range of motion. Neck supple.  Cardiovascular: Normal rate, regular rhythm, normal heart sounds and intact distal pulses.  Pulmonary/Chest:  Effort normal and breath sounds normal.  Abdominal: Soft. Bowel sounds are normal.  Musculoskeletal: Normal range of motion.  Neurological: She is alert and oriented to person, place, and time. She has normal reflexes.  Skin: Skin is warm and dry.  Psychiatric: She has a normal mood and affect. Her behavior is normal. Judgment and thought content normal.  Nursing note and vitals reviewed.   BP (!) 179/114 (BP Location: Left Arm, Patient Position: Sitting, Cuff Size: Large)   Pulse 75   Temp 98.1 F (36.7 C) (Oral)   Ht 5\' 3"  (1.6 m)   Wt  221 lb 6.4 oz (100.4 kg)   BMI 39.22 kg/m  Wt Readings from Last 3 Encounters:  09/30/18 221 lb 6.4 oz (100.4 kg)  09/17/18 215 lb (97.5 kg)  08/29/18 214 lb (97.1 kg)     Health Maintenance Due  Topic Date Due  . HIV Screening  12/12/1987  . PAP SMEAR-Modifier  12/11/1993  . INFLUENZA VACCINE  09/07/2018    There are no preventive care reminders to display for this patient.  Lab Results  Component Value Date   TSH 0.392 (L) 05/14/2017   Lab Results  Component Value Date   WBC 7.0 07/25/2018   HGB 14.4 07/25/2018   HCT 43.0 07/25/2018   MCV 88.4 07/25/2018   PLT 358.0 07/25/2018   Lab Results  Component Value Date   NA 140 05/14/2017   K 3.6 05/14/2017   CO2 24 05/14/2017   GLUCOSE 118 (H) 05/14/2017   BUN 10 05/14/2017   CREATININE 0.76 05/14/2017   BILITOT 0.2 05/14/2017   ALKPHOS 98 05/14/2017   AST 16 05/14/2017   ALT 17 05/14/2017   PROT 7.2 05/14/2017   ALBUMIN 4.1 05/14/2017   CALCIUM 9.3 05/14/2017   ANIONGAP 11 08/26/2016   Lab Results  Component Value Date   CHOL 154 05/14/2017   Lab Results  Component Value Date   HDL 37 (L) 05/14/2017   Lab Results  Component Value Date   LDLCALC 99 05/14/2017   Lab Results  Component Value Date   TRIG 88 05/14/2017   Lab Results  Component Value Date   CHOLHDL 4.2 05/14/2017   Lab Results  Component Value Date   HGBA1C 5.2 09/12/2017   Assessment &  Plan:   1. Essential hypertension Blood pressure is elevated today. We will bring patient back in for blood pressure recheck within the week. She will continue to decrease high sodium intake, excessive alcohol intake, increase potassium intake, smoking cessation, and increase physical activity of at least 30 minutes of cardio activity daily. She will continue to follow Heart Healthy or DASH diet. - amLODipine (NORVASC) 5 MG tablet; Take 1 tablet (5 mg total) by mouth daily.  Dispense: 30 tablet; Refill: 1  2. Cough - telmisartan (MICARDIS) 40 MG tablet; Take 1 tablet (40 mg total) by mouth daily.  Dispense: 30 tablet; Refill: 11 - montelukast (SINGULAIR) 10 MG tablet; Take 1 tablet (10 mg total) by mouth at bedtime.  Dispense: 30 tablet; Refill: 3  3. Shortness of breath - montelukast (SINGULAIR) 10 MG tablet; Take 1 tablet (10 mg total) by mouth at bedtime.  Dispense: 30 tablet; Refill: 3  4. Chronic obstructive pulmonary disease, unspecified COPD type (HCC) - montelukast (SINGULAIR) 10 MG tablet; Take 1 tablet (10 mg total) by mouth at bedtime.  Dispense: 30 tablet; Refill: 3  5. Muscle spasm - methocarbamol (ROBAXIN) 500 MG tablet; Take 1 tablet (500 mg total) by mouth 2 (two) times daily as needed for muscle spasms.  Dispense: 60 tablet; Refill: 2  6. Sciatic pain, right - gabapentin (NEURONTIN) 300 MG capsule; Take 2 capsules (600 mg) 3 times a day.  Dispense: 180 capsule; Refill: 3  7. Follow up She will follow up in 1 week for blood pressure check only. She will follow up for office visit in 1 month.   Meds ordered this encounter  Medications  . amLODipine (NORVASC) 5 MG tablet    Sig: Take 1 tablet (5 mg total) by mouth daily.    Dispense:  30 tablet  Refill:  1  . telmisartan (MICARDIS) 40 MG tablet    Sig: Take 1 tablet (40 mg total) by mouth daily.    Dispense:  30 tablet    Refill:  11  . montelukast (SINGULAIR) 10 MG tablet    Sig: Take 1 tablet (10 mg total) by  mouth at bedtime.    Dispense:  30 tablet    Refill:  3  . methocarbamol (ROBAXIN) 500 MG tablet    Sig: Take 1 tablet (500 mg total) by mouth 2 (two) times daily as needed for muscle spasms.    Dispense:  60 tablet    Refill:  2  . gabapentin (NEURONTIN) 300 MG capsule    Sig: Take 2 capsules (600 mg) 3 times a day.    Dispense:  180 capsule    Refill:  3    No orders of the defined types were placed in this encounter.  Referral Orders  No referral(s) requested today    Raliegh IpNatalie Anchor Dwan,  MSN, FNP-BC [facility] [address]  [place], [place] [id number] [phone]  Problem List Items Addressed This Visit    None      No orders of the defined types were placed in this encounter.   Follow-up: No follow-ups on file.    Kallie LocksNatalie M Patrich Heinze, FNP

## 2018-10-01 ENCOUNTER — Ambulatory Visit: Payer: Self-pay | Admitting: Internal Medicine

## 2018-10-01 DIAGNOSIS — J449 Chronic obstructive pulmonary disease, unspecified: Secondary | ICD-10-CM | POA: Insufficient documentation

## 2018-10-01 DIAGNOSIS — M5431 Sciatica, right side: Secondary | ICD-10-CM | POA: Insufficient documentation

## 2018-10-01 DIAGNOSIS — M62838 Other muscle spasm: Secondary | ICD-10-CM | POA: Insufficient documentation

## 2018-10-04 ENCOUNTER — Other Ambulatory Visit: Payer: Self-pay

## 2018-10-04 ENCOUNTER — Telehealth: Payer: Self-pay

## 2018-10-04 DIAGNOSIS — I10 Essential (primary) hypertension: Secondary | ICD-10-CM

## 2018-10-04 MED ORDER — AMLODIPINE BESYLATE 5 MG PO TABS: 5.0000 mg | ORAL_TABLET | Freq: Every day | ORAL | 1 refills | Status: DC

## 2018-10-04 MED FILL — AMLODIPINE BESYLATE 5 MG TA: 5 | 30 days supply | Qty: 30 | Fill #0

## 2018-10-04 NOTE — Telephone Encounter (Signed)
Called and inform pt that her refill was sent to [facility] . Pt said that she will pick up on Monday.

## 2018-10-04 NOTE — Telephone Encounter (Signed)
Sent refill to pharmacy [facility].

## 2018-10-08 DIAGNOSIS — E559 Vitamin D deficiency, unspecified: Secondary | ICD-10-CM

## 2018-10-08 HISTORY — DX: Vitamin D deficiency, unspecified: E55.9

## 2018-10-11 ENCOUNTER — Ambulatory Visit (INDEPENDENT_AMBULATORY_CARE_PROVIDER_SITE_OTHER): Payer: Self-pay | Admitting: Internal Medicine

## 2018-10-11 ENCOUNTER — Ambulatory Visit: Payer: Medicaid Other | Admitting: Family Medicine

## 2018-10-11 ENCOUNTER — Other Ambulatory Visit: Payer: Self-pay

## 2018-10-11 ENCOUNTER — Encounter: Payer: Self-pay | Admitting: *Deleted

## 2018-10-11 ENCOUNTER — Encounter: Payer: Self-pay | Admitting: Internal Medicine

## 2018-10-11 VITALS — BP 150/100 | HR 80

## 2018-10-11 DIAGNOSIS — R05 Cough: Secondary | ICD-10-CM

## 2018-10-11 DIAGNOSIS — Z0130 Encounter for examination of blood pressure without abnormal findings: Secondary | ICD-10-CM

## 2018-10-11 DIAGNOSIS — I10 Essential (primary) hypertension: Secondary | ICD-10-CM

## 2018-10-11 DIAGNOSIS — Z09 Encounter for follow-up examination after completed treatment for conditions other than malignant neoplasm: Secondary | ICD-10-CM

## 2018-10-11 DIAGNOSIS — R059 Cough, unspecified: Secondary | ICD-10-CM

## 2018-10-11 NOTE — Progress Notes (Signed)
[facility]   Blood Pressure Check  Subjective:  Patient ID: Catherine Kane, female    DOB: 10/24/1970  Age: 48 y.o. MRN: [id number]  CC: No chief complaint on file.   HPI Catherine Kane is a 48 year old female who presents for blood pressure check only.   Past Medical History:  Diagnosis Date  . Asthma   . Bronchitis   . Chronic obstructive pulmonary disease (HCC) 05/03/2018  . Hypertension    Current Status: Since her last office visit, her anxiety is increased today, as her daughter is [redacted] weeks pregnant and is currently in labor. She just took her blood pressure medications prior to coming into the clinic. She denies visual changes, chest pain, cough, shortness of breath, heart palpitations, and falls. She has occasional headaches and dizziness with position changes. Denies severe headaches, confusion, seizures, double vision, and blurred vision, nausea and vomiting.  She denies fevers, chills, fatigue, recent infections, weight loss, and night sweats. No reports of GI problems such as diarrhea, and constipation. She has no reports of blood in stools, dysuria and hematuria. She denies pain today.   Past Surgical History:  Procedure Laterality Date  . TONSILLECTOMY      Family History  Problem Relation Age of Onset  . Heart disease Mother   . Heart disease Sister     Social History   Socioeconomic History  . Marital status: Single    Spouse name: Not on file  . Number of children: Not on file  . Years of education: Not on file  . Highest education level: Not on file  Occupational History  . Not on file  Social Needs  . Financial resource strain: Not on file  . Food insecurity    Worry: Not on file    Inability: Not on file  . Transportation needs    Medical: Not on file    Non-medical: Not on file  Tobacco Use  . Smoking status: Never Smoker  . Smokeless tobacco: Never Used  Substance  and Sexual Activity  . Alcohol use: No  . Drug use: No  . Sexual activity: Yes  Lifestyle  . Physical activity    Days per week: Not on file    Minutes per session: Not on file  . Stress: Not on file  Relationships  . Social Musicianconnections    Talks on phone: Not on file    Gets together: Not on file    Attends religious service: Not on file    Active member of club or organization: Not on file    Attends meetings of clubs or organizations: Not on file    Relationship status: Not on file  . Intimate partner violence    Fear of current or ex partner: Not on file    Emotionally abused: Not on file    Physically abused: Not on file    Forced sexual activity: Not on file  Other Topics Concern  . Not on file  Social History Narrative  . Not on file    Outpatient Medications Prior to Visit  Medication Sig Dispense Refill  . acetaminophen-codeine (TYLENOL #3) 300-30 MG tablet Take 1-2 tablets by mouth every 4 (four) hours as needed (cough). 40 tablet 0  . amLODipine (NORVASC) 5 MG tablet Take 1 tablet (5 mg total) by mouth daily. 30 tablet 1  . aspirin EC 81 MG tablet Take 81 mg by mouth daily.    .Marland Kitchen  Dextromethorphan-Menthol (DELSYM COUGH RELIEF MT) Use as directed in the mouth or throat as needed.    . famotidine (PEPCID) 20 MG tablet One after supper 30 tablet 11  . Ferrous Sulfate (IRON) 325 (65 Fe) MG TABS Take 1 tablet by mouth daily.    Marland Kitchen FLUoxetine (PROZAC) 10 MG tablet Take 1 tablet (10 mg total) by mouth daily. 30 tablet 3  . gabapentin (NEURONTIN) 300 MG capsule Take 2 capsules (600 mg) 3 times a day. 180 capsule 3  . hydrochlorothiazide (HYDRODIURIL) 25 MG tablet Take 1 tablet (25 mg total) by mouth daily. 30 tablet 3  . ibuprofen (ADVIL,MOTRIN) 800 MG tablet Take 1 tablet (800 mg total) by mouth every 8 (eight) hours as needed for mild pain or moderate pain. 30 tablet 3  . methocarbamol (ROBAXIN) 500 MG tablet Take 1 tablet (500 mg total) by mouth 2 (two) times daily as needed  for muscle spasms. 60 tablet 2  . montelukast (SINGULAIR) 10 MG tablet Take 1 tablet (10 mg total) by mouth at bedtime. 30 tablet 3  . pantoprazole (PROTONIX) 40 MG tablet Take 1 tablet (40 mg total) by mouth daily. Take 30-60 min before first meal of the day 30 tablet 2  . telmisartan (MICARDIS) 40 MG tablet Take 1 tablet (40 mg total) by mouth daily. 30 tablet 11  . traMADol (ULTRAM) 50 MG tablet Take 50 mg by mouth every 6 (six) hours as needed.     No facility-administered medications prior to visit.     No Known Allergies  ROS Review of Systems  Constitutional: Negative.   HENT: Negative.   Eyes: Negative.   Respiratory: Negative.   Cardiovascular: Negative.   Gastrointestinal: Positive for abdominal distention.  Endocrine: Negative.   Genitourinary: Negative.   Musculoskeletal: Positive for arthralgias (generalized).  Skin: Negative.   Allergic/Immunologic: Negative.   Neurological: Positive for dizziness (occasional ) and headaches (occasional).  Hematological: Negative.   Psychiatric/Behavioral: Negative.    Objective:    Physical Exam  Constitutional: She is oriented to person, place, and time. She appears well-developed and well-nourished.  HENT:  Head: Normocephalic and atraumatic.  Eyes: Conjunctivae are normal.  Neck: Normal range of motion. Neck supple.  Cardiovascular: Normal rate, regular rhythm, normal heart sounds and intact distal pulses.  Pulmonary/Chest: Effort normal and breath sounds normal.  Abdominal: Soft. Bowel sounds are normal.  Musculoskeletal: Normal range of motion.  Neurological: She is alert and oriented to person, place, and time.  Skin: Skin is warm and dry.  Nursing note and vitals reviewed.  BP (!) 150/100 (BP Location: Right Arm, Patient Position: Sitting, Cuff Size: Normal)   Pulse 80  Wt Readings from Last 3 Encounters:  10/11/18 225 lb (102.1 kg)  09/30/18 221 lb 6.4 oz (100.4 kg)  09/17/18 215 lb (97.5 kg)     Health  Maintenance Due  Topic Date Due  . HIV Screening  12/12/1987  . PAP SMEAR-Modifier  12/11/1993  . INFLUENZA VACCINE  09/07/2018    There are no preventive care reminders to display for this patient.  Lab Results  Component Value Date   TSH 0.392 (L) 05/14/2017   Lab Results  Component Value Date   WBC 7.0 07/25/2018   HGB 14.4 07/25/2018   HCT 43.0 07/25/2018   MCV 88.4 07/25/2018   PLT 358.0 07/25/2018   Lab Results  Component Value Date   NA 140 05/14/2017   K 3.6 05/14/2017   CO2 24 05/14/2017   GLUCOSE 118 (  H) 05/14/2017   BUN 10 05/14/2017   CREATININE 0.76 05/14/2017   BILITOT 0.2 05/14/2017   ALKPHOS 98 05/14/2017   AST 16 05/14/2017   ALT 17 05/14/2017   PROT 7.2 05/14/2017   ALBUMIN 4.1 05/14/2017   CALCIUM 9.3 05/14/2017   ANIONGAP 11 08/26/2016   Lab Results  Component Value Date   CHOL 154 05/14/2017   Lab Results  Component Value Date   HDL 37 (L) 05/14/2017   Lab Results  Component Value Date   LDLCALC 99 05/14/2017   Lab Results  Component Value Date   TRIG 88 05/14/2017   Lab Results  Component Value Date   CHOLHDL 4.2 05/14/2017   Lab Results  Component Value Date   HGBA1C 5.2 09/12/2017      Assessment & Plan:   1. Blood pressure check Blood pressures are elevated today. Clonidine 0.3 mg given to patient in office and blood pressures stablized. She denies severe headaches, confusion, seizures, double vision, and blurred vision, nausea and vomiting. She will report to ED if he experiences these symptoms. Patient verbalized understanding.    2. Essential hypertension The current medical regimen is effective; blood presure is stable at  today; continue present plan and medications a/s prescribed. She will continue to decrease high sodium intake, excessive alcohol intake, increase potassium intake, smoking cessation, and increase physical activity of at least 30 minutes of cardio activity daily. She will continue to follow Heart  Healthy or DASH diet.  3. Follow up She will keep follow up appointment.   No orders of the defined types were placed in this encounter.   No orders of the defined types were placed in this encounter.   Referral Orders  No referral(s) requested today    Raliegh Ip,  MSN, FNP-BC [facility] [address] [place], [place] [address] [phone]- fax  Problem List Items Addressed This Visit      Cardiovascular and Mediastinum   Essential hypertension    Other Visit Diagnoses    Blood pressure check    -  Primary   Follow up          No orders of the defined types were placed in this encounter.   Follow-up: Return in about 1 week (around 10/18/2018).    Kallie Locks, FNP

## 2018-10-11 NOTE — Patient Instructions (Addendum)
Continuous leave 09/13/2018 until 10/25/18 and should only do isolation rooms once a week  because full PPE makes it hard to breathe.   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  Increase chlorpheniramine to 4 mb x 2 an hour before bed   Please schedule a follow up office visit in 4 weeks, sooner if needed

## 2018-10-11 NOTE — Progress Notes (Addendum)
Catherine Kane, female    DOB: 02/14/1970,     MRN: [id number]   Brief patient profile:  58 yobf never smoker ? 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 since when?  and around 2005 noted onset cough dx of asthma in [place] with season change typically exac with uri  And in no need for any maint rx other protonix but then while on ppi April 25 2018 severe cough /sob refractory to saba hfa/neb/singulair/flonase/ zyrtec/no prednisone > put out of work since March 19th and not worked since and 4 ov's with HCP's  So self referred to pulmonary 06/25/2018       History of Present Illness  06/25/2018  Pulmonary/ 1st office eval/Catherine Kane  Chief Complaint  Patient presents with  . Pulmonary Consult    Self referral. Pt c/o cough and increased SOB for the past several wks. Cough is non prod.   Dyspnea:  Mailbox and back prior to March 19 no problem, flat 50 ft ok now struggles /makes her cough Cough: dry/ day > noct more with movement , sitting fine, sleeping fine / speaking makes it worse / so does laughing/ blowing up balloon SABA use: 2 hfa's 1 neb no better / pred always helps some for a few days at leas rec Stop lisinopril and start micardis 40 mg one daily  Prednisone 10 mg take  4 each am x 2 days,   2 each am x 2 days,  1 each am x 2 days and stop  Change protonix to 40 mg Take 30-60 min before first meal of the day and pepcid 20 mg after supper until you return  Best cough syrup is delsym 2tsp every 12 hours as needed  GERD  Only use your albuterol as a rescue medication Please schedule a follow up office visit in 6 weeks, call sooner if needed with all medications /inhalers/ solutions in hand so we can verify exactly what you are taking. This includes all medications from all doctors and over the counters    07/10/2018  f/u ov/Catherine Kane re: severe cough not resp to gabapentin  300 tid and delsym  Chief Complaint  Patient presents with  . Follow-up    Patient states that her dry cough is about the same since last visit. She states that she uses her resue inhaler twice daily.  Dyspnea:  Mostly sob when coughing Cough: dry honking 24/7  Sleeping: on side wedge pillow  SABA use: albuterol not helping rec No work for 2 weeks  Depomedrol 120 mg IM today  Take delsym two tsp every 12 hours and supplement if needed with  tramadol 50 mg up to 2 every 4 hours to suppress the urge to cough. Once you have eliminated the cough for 3 straight days try reducing the tramadol first,  then the delsym as tolerated.   For drainage / throat tickle try take CHLORPHENIRAMINE  4 mg  (Chlortab 4mg   at [facility] should be easiest to find in the green box)  take one every 4 hours as needed      07/25/2018 acute extended ov/Catherine Kane re:  Severe cough off ace x 4 weeks Chief Complaint  Patient presents with  . Acute Visit    Increased cough over the past 4 days- non prod.   tickle when lie down only taking 1  h1 at hs (instructions were to take up to 2 ) no better with saba  Sleeping on wedge   Not limited by breathing from desired activities   rec For drainage / throat tickle try take CHLORPHENIRAMINE  4 mg  Increase gabapentin to 300 mg 4 x daily  Increase pepcid to 10 mg x 2 x 1 hours before bedtime  depomedrol 120 mg    08/08/2018  f/u ov/Catherine Kane re: off ace x 6 weeks  maint on singulair /  Improving cough  Chief Complaint  Patient presents with  . Follow-up    Cough has improved some.   Dyspnea:  Limited by back not breath Cough: 95% better, did cough with grass exp/laughing w/in one week of ov  Sleeping: fine on wedge  SABA use: none 02: none  rec No change rx  Return to July 18th, coughing same day working than she does at home    08/29/2018  f/u ov/Catherine Kane re: uacs vs cough variant asthma Chief Complaint  Patient presents with  . Acute Visit    Pt states she is  continuing to have dry cough and SOB. She states she can not work at all due to these symptoms.   Dyspnea:  MMRC3 = can't walk 100 yards even at a slow pace at a flat grade s stopping due to sob  /back pain  Cough: harsh honking no mucus Sleeping: on wedge/ recliner worse flat coughing/ gasping SABA use: albterol not helping  02: none  Reports better p depomedrol rx  rec Depomedrol 120 mg IM  For drainage / throat tickle try take CHLORPHENIRAMINE  4 mg  (Chlortab 4mg   at [facility] should be easiest to find in the green box)  take one every 4 hours as needed - available over the counter- may cause drowsiness so start with just a bedtime dose or two and see how you tolerate it before trying in daytime   Symbicort 80 Take 2 puffs first thing in am and then another 2 puffs about 12 hours later.  Please schedule a follow up office visit in 2 weeks, sooner if needed  with all medications /inhalers/ solutions in hand so we can verify exactly what you are taking. This includes all medications from all doctors and over the counters    09/17/2018  f/u ov/Catherine Kane re:  Prior March 2020 no need any resp rx / still tickle in throat / avg tramadol 5 per day  Chief Complaint  Patient presents with  . Follow-up    doing ok "as long as I don't move"  Dyspnea:  50 ft even if not coughing  Cough: dry honking sound  Sleeping: wedge x 30 degrees since early March 2020 /  SABA use: albuterol not helping not even neb  02: none  rec Depomedrol 120 mg IM today and stop symbicort Take delsym two tsp every 12 hours and supplement if needed with  Tylenol #3   up to 1-2 every 4 hoursd.    Continue gabapentin 300 mg four times daily  Please schedule a follow up office visit in 2 weeks  10/11/2018  f/u ov/Catherine Kane re: cough x March 2020 Chief Complaint  Patient presents with  . Follow-up    Cough is much improved- has not coughed in a wk.    Dyspnea:  MMRC1 = can walk nl pace, flat grade, can't hurry or go  uphills or steps s sob   Cough: only tylenol #3 x one at hs s  delsym or h1 at hs Sleeping: wedge  SABA use: none  02: none    No obvious day to day or daytime variability or assoc excess/ purulent sputum or mucus plugs or hemoptysis or cp or chest tightness, subjective wheeze or overt sinus or hb symptoms.   Sleeping  without nocturnal  or early am exacerbation  of respiratory  c/o's or need for noct saba. Also denies any obvious fluctuation of symptoms with weather or environmental changes or other aggravating or alleviating factors except as outlined above   No unusual exposure hx or h/o childhood pna/ asthma or knowledge of premature birth.  Current Allergies, Complete Past Medical History, Past Surgical History, Family History, and Social History were reviewed in [employer] record.  ROS  The following are not active complaints unless bolded Hoarseness, sore throat, dysphagia, dental problems, itching, sneezing,  nasal congestion or discharge of excess mucus or purulent secretions, ear ache,   fever, chills, sweats, unintended wt loss or wt gain, classically pleuritic or exertional cp,  orthopnea pnd or arm/hand swelling  or leg swelling, presyncope, palpitations, abdominal pain, anorexia, nausea, vomiting, diarrhea  or change in bowel habits or change in bladder habits, change in stools or change in urine, dysuria, hematuria,  rash, arthralgias, visual complaints, headache, numbness, weakness or ataxia or problems with walking or coordination,  change in mood or  memory.        Current Meds  Medication Sig  . acetaminophen-codeine (TYLENOL #3) 300-30 MG tablet Take 1-2 tablets by mouth every 4 (four) hours as needed (cough).  Marland Kitchen amLODipine (NORVASC) 5 MG tablet Take 1 tablet (5 mg total) by mouth daily.  Marland Kitchen aspirin EC 81 MG tablet Take 81 mg by mouth daily.  . cetirizine (ZYRTEC) 10 MG tablet Take 10 mg by mouth daily.  . chlorpheniramine (CHLOR-TRIMETON) 4 MG  tablet Take 4 mg by mouth every 4 (four) hours as needed for allergies.  . Dextromethorphan-Menthol (DELSYM COUGH RELIEF MT) Use as directed in the mouth or throat as needed.  . famotidine (PEPCID) 20 MG tablet One after supper  . Ferrous Sulfate (IRON) 325 (65 Fe) MG TABS Take 1 tablet by mouth daily.  Marland Kitchen FLUoxetine (PROZAC) 10 MG tablet Take 1 tablet (10 mg total) by mouth daily.  Marland Kitchen gabapentin (NEURONTIN) 300 MG capsule Take 2 capsules (600 mg) 3 times a day.  . hydrochlorothiazide (HYDRODIURIL) 25 MG tablet Take 1 tablet (25 mg total) by mouth daily.  Marland Kitchen ibuprofen (ADVIL,MOTRIN) 800 MG tablet Take 1 tablet (800 mg total) by mouth every 8 (eight) hours as needed for mild pain or moderate pain.  . methocarbamol (ROBAXIN) 500 MG tablet Take 1 tablet (500 mg total) by mouth 2 (two) times daily as needed for muscle spasms.  . montelukast (SINGULAIR) 10 MG tablet Take 1 tablet (10 mg total) by mouth at bedtime.  . pantoprazole (PROTONIX) 40 MG tablet Take 1 tablet (40 mg total) by mouth daily. Take 30-60 min before first meal of the day  .     . traMADol (ULTRAM) 50 MG tablet Take 50 mg by mouth every 6 (six) hours as needed.  . valACYclovir (VALTREX) 500 MG tablet Take 500 mg by mouth 2 (two) times daily.                 Objective:     10/11/2018          225  09/17/2018        215  08/29/2018        214  08/08/2018          213  07/25/2018        215   07/10/18 210 lb (95.3 kg)  06/25/18 208 lb 9.6 oz (94.6 kg)  05/31/18 200 lb (90.7 kg)     amb bf nad    Vital signs reviewed - Note on arrival 02 sats  97% on RA  And bp 140/94    HEENT : pt wearing mask not removed for exam due to covid - 19 concerns.    NECK :  without JVD/Nodes/TM/ nl carotid upstrokes bilaterally   LUNGS: no acc muscle use,  Nl contour chest which is clear to A and P bilaterally without cough on insp or exp maneuvers   CV:  RRR  no s3 or murmur or increase in P2, and no edema   ABD:  Obese soft and  nontender with nl inspiratory excursion in the supine position. No bruits or organomegaly appreciated, bowel sounds nl  MS:  Nl gait/ ext warm without deformities, calf tenderness, cyanosis or clubbing No obvious joint restrictions   SKIN: warm and dry without lesions    NEURO:  alert, approp, nl sensorium with  no motor or cerebellar deficits apparent.            Assessment

## 2018-10-13 ENCOUNTER — Encounter: Payer: Self-pay | Admitting: Internal Medicine

## 2018-10-13 NOTE — Assessment & Plan Note (Signed)
Changed acei to arb 06/25/2018 due to cough/ pseudoasthma   Although even in retrospect it may not be clear the ACEi contributed to the pt's symptoms,  Pt improved off them and adding them back at this point or in the future would risk confusion in interpretation of non-specific respiratory symptoms to which this patient is prone  ie  Better not to muddy the waters here.   >>>> Not optimally controlled on present regimen. I reviewed this with the patient and emphasized importance of follow-up with primary care.

## 2018-10-13 NOTE — Assessment & Plan Note (Signed)
Onset cough March 2020 - d/c acei 06/25/2018 and max rx for gerd  - cyclical cough rx 07/08/6071  - Allergy profile 07/25/2018 >  Eos 0.3 /  IgE 52 with RAST pos ragweed, grass, trees dust  - 08/29/2018  After extensive coaching inhaler device,  effectiveness =    75% try symb 80 2bid x 2 weeks > no improvement in cough or doe so d/c 09/17/2018 > cough 95% resolved 10/11/2018 s codeine x at hs    Much better x still using tyl #3 hs rec use 1st gen H1 blockers per guidelines  Instead up to 2 at hs   Extended discussion of work issues  - see avs    F/u at 4 weeks    I had an extended discussion with the patient reviewing all relevant studies completed to date and  lasting 15 to 20 minutes of a 25 minute visit    Each maintenance medication was reviewed in detail including most importantly the difference between maintenance and prns and under what circumstances the prns are to be triggered using an action plan format that is not reflected in the computer generated alphabetically organized AVS.     Please see AVS for specific instructions unique to this visit that I personally wrote and verbalized to the the pt in detail and then reviewed with pt  by my nurse highlighting any  changes in therapy recommended at today's visit to their plan of care.

## 2018-10-21 ENCOUNTER — Telehealth: Payer: Self-pay | Admitting: Internal Medicine

## 2018-10-21 NOTE — Telephone Encounter (Signed)
Call returned to patient, she reports [employer] told her that they faxed over some paper work on 10/16/2018 and they have not received anything back yet. She also reports the paper work also needs an updated return date. Please push out to the 25th. She reports the original return date was 10/25/2018 but she does not think all her paper will be in by that time.   Attempted to call [employer] [phone], no voicemail available. Will hold in triage and try back later.

## 2018-10-22 NOTE — Telephone Encounter (Signed)
Given to [place], thanks

## 2018-10-22 NOTE — Telephone Encounter (Signed)
Done

## 2018-10-22 NOTE — Telephone Encounter (Signed)
Rec'd accommodation forms via interoffice from [employer]. Fwd to [place] to have Dr. Melvyn Novas sign-pr

## 2018-10-22 NOTE — Telephone Encounter (Signed)
Placed in Dr Wert's lookat 

## 2018-10-23 NOTE — Telephone Encounter (Signed)
Fwd to [employer] via interoffice mail -pr  °

## 2018-10-25 ENCOUNTER — Telehealth: Payer: Self-pay | Admitting: Internal Medicine

## 2018-10-25 ENCOUNTER — Other Ambulatory Visit: Payer: Self-pay | Admitting: Internal Medicine

## 2018-10-25 DIAGNOSIS — R05 Cough: Secondary | ICD-10-CM

## 2018-10-25 DIAGNOSIS — R059 Cough, unspecified: Secondary | ICD-10-CM

## 2018-10-25 NOTE — Telephone Encounter (Signed)
Pt stated that she still hasn't heard anything back from her FMLA paperwork. Also stated that [employer] telling  her that they haven't received anything as well

## 2018-10-25 NOTE — Telephone Encounter (Signed)
LMTCB.   Call made to [employer] they have the paperwork and have sent it back to her insurance. They received the paper work today.

## 2018-10-28 MED FILL — IBUPROFEN 800 MG TABLET: 800 | 10 days supply | Qty: 30 | Fill #1

## 2018-10-28 MED FILL — PANTOPRAZOLE SOD DR 40 MG T: 40 | 30 days supply | Qty: 30 | Fill #0

## 2018-10-29 NOTE — Telephone Encounter (Signed)
Spoke with pt and she states she received the paperwork yesterday. Nothing further is needed.

## 2018-11-01 ENCOUNTER — Other Ambulatory Visit: Payer: Self-pay

## 2018-11-01 ENCOUNTER — Encounter: Payer: Self-pay | Admitting: Family Medicine

## 2018-11-01 ENCOUNTER — Ambulatory Visit (INDEPENDENT_AMBULATORY_CARE_PROVIDER_SITE_OTHER): Payer: Self-pay | Admitting: Family Medicine

## 2018-11-01 ENCOUNTER — Other Ambulatory Visit: Payer: Self-pay | Admitting: Family Medicine

## 2018-11-01 VITALS — BP 141/93 | HR 77 | Temp 97.6°F | Wt 221.0 lb

## 2018-11-01 DIAGNOSIS — I10 Essential (primary) hypertension: Secondary | ICD-10-CM

## 2018-11-01 DIAGNOSIS — Z1231 Encounter for screening mammogram for malignant neoplasm of breast: Secondary | ICD-10-CM

## 2018-11-01 DIAGNOSIS — Z09 Encounter for follow-up examination after completed treatment for conditions other than malignant neoplasm: Secondary | ICD-10-CM

## 2018-11-01 DIAGNOSIS — Z0130 Encounter for examination of blood pressure without abnormal findings: Secondary | ICD-10-CM

## 2018-11-01 DIAGNOSIS — R0602 Shortness of breath: Secondary | ICD-10-CM

## 2018-11-01 DIAGNOSIS — F419 Anxiety disorder, unspecified: Secondary | ICD-10-CM

## 2018-11-01 DIAGNOSIS — Z1239 Encounter for other screening for malignant neoplasm of breast: Secondary | ICD-10-CM

## 2018-11-01 DIAGNOSIS — Z23 Encounter for immunization: Secondary | ICD-10-CM

## 2018-11-01 MED ORDER — AMLODIPINE BESYLATE 10 MG PO TABS: 10.0000 mg | ORAL_TABLET | Freq: Every day | ORAL | 3 refills | Status: DC

## 2018-11-01 MED ORDER — FLUOXETINE HCL 20 MG PO TABS: 20.0000 mg | ORAL_TABLET | Freq: Every day | ORAL | 3 refills | Status: DC

## 2018-11-01 MED FILL — FLUoxetine HCL 20 MG TABS: 20 | 30 days supply | Qty: 30 | Fill #0

## 2018-11-01 MED FILL — AMLODIPINE BESYLATE 10 MG T: 10 | 30 days supply | Qty: 30 | Fill #0

## 2018-11-01 NOTE — Progress Notes (Signed)
[facility]   Established Patient Office Visit  Subjective:  Patient ID: Catherine Kane, female    DOB: 07/03/1970  Age: 48 y.o. MRN: [id number]  CC:  Chief Complaint  Patient presents with  . Follow-up    1 month follow up , heart feel it skipping a beat     HPI Payslee Janalynn Kane is a 48 year old female who presents for Follow Up today.   Past Medical History:  Diagnosis Date  . Asthma   . Bronchitis   . Chronic obstructive pulmonary disease (HCC) 05/03/2018  . Hypertension    Current Status: Since her last office visit, she is doing well with no complaints. She reports that her home blood pressures are usually 140s-150s/ 93-96.  She has been having heart palpitations occasionally. She denies visual changes, chest pain, cough, shortness of breath, and falls. She has occasional headaches and dizziness with position changes. Denies severe headaches, confusion, seizures, double vision, and blurred vision, nausea and vomiting. She denies fevers, chills, fatigue, recent infections, weight loss, and night sweats.  No reports of GI problems such as diarrhea, and constipation. She has no reports of blood in stools, dysuria and hematuria. No depression or anxiety reported. She denies pain today.   Past Surgical History:  Procedure Laterality Date  . TONSILLECTOMY      Family History  Problem Relation Age of Onset  . Heart disease Mother   . Heart disease Sister     Social History   Socioeconomic History  . Marital status: Single    Spouse name: Not on file  . Number of children: Not on file  . Years of education: Not on file  . Highest education level: Not on file  Occupational History  . Not on file  Social Needs  . Financial resource strain: Not on file  . Food insecurity    Worry: Not on file    Inability: Not on file  . Transportation needs    Medical: Not on file    Non-medical: Not on file   Tobacco Use  . Smoking status: Never Smoker  . Smokeless tobacco: Never Used  Substance and Sexual Activity  . Alcohol use: No  . Drug use: No  . Sexual activity: Yes  Lifestyle  . Physical activity    Days per week: Not on file    Minutes per session: Not on file  . Stress: Not on file  Relationships  . Social Musician on phone: Not on file    Gets together: Not on file    Attends religious service: Not on file    Active member of club or organization: Not on file    Attends meetings of clubs or organizations: Not on file    Relationship status: Not on file  . Intimate partner violence    Fear of current or ex partner: Not on file    Emotionally abused: Not on file    Physically abused: Not on file    Forced sexual activity: Not on file  Other Topics Concern  . Not on file  Social History Narrative  . Not on file    Outpatient Medications Prior to Visit  Medication Sig Dispense Refill  . aspirin EC 81 MG tablet Take 81 mg by mouth daily.    . cetirizine (ZYRTEC) 10 MG tablet Take 10 mg by mouth daily.    . chlorpheniramine (CHLOR-TRIMETON) 4 MG tablet  Take 4 mg by mouth every 4 (four) hours as needed for allergies.    . Dextromethorphan-Menthol (DELSYM COUGH RELIEF MT) Use as directed in the mouth or throat as needed.    . famotidine (PEPCID) 20 MG tablet One after supper 30 tablet 11  . Ferrous Sulfate (IRON) 325 (65 Fe) MG TABS Take 1 tablet by mouth daily.    Marland Kitchen gabapentin (NEURONTIN) 300 MG capsule Take 2 capsules (600 mg) 3 times a day. 180 capsule 3  . hydrochlorothiazide (HYDRODIURIL) 25 MG tablet Take 1 tablet (25 mg total) by mouth daily. 30 tablet 3  . ibuprofen (ADVIL,MOTRIN) 800 MG tablet Take 1 tablet (800 mg total) by mouth every 8 (eight) hours as needed for mild pain or moderate pain. 30 tablet 3  . methocarbamol (ROBAXIN) 500 MG tablet Take 1 tablet (500 mg total) by mouth 2 (two) times daily as needed for muscle spasms. 60 tablet 2  .  montelukast (SINGULAIR) 10 MG tablet Take 1 tablet (10 mg total) by mouth at bedtime. 30 tablet 3  . pantoprazole (PROTONIX) 40 MG tablet TAKE 1 TABLET (40 MG TOTAL) BY MOUTH DAILY. TAKE 30-60 MIN BEFORE FIRST MEAL OF THE DAY 30 tablet 5  . telmisartan (MICARDIS) 40 MG tablet Take 1 tablet (40 mg total) by mouth daily. 30 tablet 11  . traMADol (ULTRAM) 50 MG tablet Take 50 mg by mouth every 6 (six) hours as needed.    . valACYclovir (VALTREX) 500 MG tablet Take 500 mg by mouth 2 (two) times daily.    Marland Kitchen amLODipine (NORVASC) 5 MG tablet Take 1 tablet (5 mg total) by mouth daily. 30 tablet 1  . FLUoxetine (PROZAC) 10 MG tablet Take 1 tablet (10 mg total) by mouth daily. 30 tablet 3  . acetaminophen-codeine (TYLENOL #3) 300-30 MG tablet Take 1-2 tablets by mouth every 4 (four) hours as needed (cough). (Patient not taking: Reported on 11/01/2018) 40 tablet 0   No facility-administered medications prior to visit.     No Known Allergies  ROS Review of Systems  Constitutional: Negative.   HENT: Negative.   Eyes: Negative.   Respiratory: Positive for shortness of breath (occasional ).   Cardiovascular: Positive for palpitations (occasional ).  Gastrointestinal: Positive for abdominal distention.  Endocrine: Negative.   Genitourinary: Negative.   Musculoskeletal: Positive for arthralgias (generalized arthritic pain).  Skin: Negative.   Allergic/Immunologic: Negative.   Neurological: Positive for dizziness (occasional ) and headaches (occasional ).  Hematological: Negative.   Psychiatric/Behavioral: Negative.    Objective:    Physical Exam  Constitutional: She is oriented to person, place, and time. She appears well-developed and well-nourished.  HENT:  Head: Normocephalic and atraumatic.  Eyes: Conjunctivae are normal.  Neck: Normal range of motion. Neck supple.  Cardiovascular: Normal rate, regular rhythm, normal heart sounds and intact distal pulses.  Pulmonary/Chest: Effort normal and  breath sounds normal.  Abdominal: Soft. Bowel sounds are normal.  Musculoskeletal: Normal range of motion.  Neurological: She is alert and oriented to person, place, and time.  Skin: Skin is warm and dry.  Psychiatric: She has a normal mood and affect. Her behavior is normal. Judgment and thought content normal.  Nursing note and vitals reviewed.   BP (!) 141/93 (BP Location: Right Arm, Patient Position: Sitting, Cuff Size: Large)   Pulse 77   Temp 97.6 F (36.4 C) (Oral)   Wt 221 lb (100.2 kg)   SpO2 100%   BMI 39.15 kg/m  Wt Readings from Last  3 Encounters:  11/01/18 221 lb (100.2 kg)  10/11/18 225 lb (102.1 kg)  09/30/18 221 lb 6.4 oz (100.4 kg)    Health Maintenance Due  Topic Date Due  . HIV Screening  12/12/1987  . PAP SMEAR-Modifier  12/11/1993    There are no preventive care reminders to display for this patient.  Lab Results  Component Value Date   TSH 1.000 11/01/2018   Lab Results  Component Value Date   WBC 7.0 07/25/2018   HGB 14.4 07/25/2018   HCT 43.0 07/25/2018   MCV 88.4 07/25/2018   PLT 358.0 07/25/2018   Lab Results  Component Value Date   NA 139 11/01/2018   K 3.2 (L) 11/01/2018   CO2 25 11/01/2018   GLUCOSE 99 11/01/2018   BUN 11 11/01/2018   CREATININE 0.83 11/01/2018   BILITOT 0.2 11/01/2018   ALKPHOS 115 11/01/2018   AST 26 11/01/2018   ALT 57 (H) 11/01/2018   PROT 7.9 11/01/2018   ALBUMIN 4.2 11/01/2018   CALCIUM 9.7 11/01/2018   ANIONGAP 11 08/26/2016   Lab Results  Component Value Date   CHOL 209 (H) 11/01/2018   Lab Results  Component Value Date   HDL 54 11/01/2018   Lab Results  Component Value Date   LDLCALC 99 05/14/2017   Lab Results  Component Value Date   TRIG 92 11/01/2018   Lab Results  Component Value Date   CHOLHDL 3.9 11/01/2018   Lab Results  Component Value Date   HGBA1C 5.2 09/12/2017   Assessment & Plan:   1. Essential hypertension We will increase Amlodipine to 10 mg for better  management of blood pressures today. She will continue to decrease high sodium intake, excessive alcohol intake, increase potassium intake, smoking cessation, and increase physical activity of at least 30 minutes of cardio activity daily. She will continue to follow Heart Healthy or DASH diet. - Comprehensive metabolic panel - TSH - Lipid Panel - Vitamin B12 - Vitamin D, 25-hydroxy - amLODipine (NORVASC) 10 MG tablet; Take 1 tablet (10 mg total) by mouth daily.  Dispense: 30 tablet; Refill: 3  2. Blood pressure check  3. Anxiety We will increase Prozac to 20 mg today.  - FLUoxetine (PROZAC) 20 MG tablet; Take 1 tablet (20 mg total) by mouth daily.  Dispense: 30 tablet; Refill: 3  4. Shortness of breath No signs or symptoms of respiratory distress noted or reported.   5. Need for influenza vaccination - Flu Vaccine QUAD 6+ mos PF IM (Fluarix Quad PF)  6. Encounter for screening mammogram for breast cancer  7. Follow up She will follow up in 6 months.   Meds ordered this encounter  Medications  . FLUoxetine (PROZAC) 20 MG tablet    Sig: Take 1 tablet (20 mg total) by mouth daily.    Dispense:  30 tablet    Refill:  3  . amLODipine (NORVASC) 10 MG tablet    Sig: Take 1 tablet (10 mg total) by mouth daily.    Dispense:  30 tablet    Refill:  3    Orders Placed This Encounter  Procedures  . Flu Vaccine QUAD 6+ mos PF IM (Fluarix Quad PF)  . Comprehensive metabolic panel  . TSH  . Lipid Panel  . Vitamin B12  . Vitamin D, 25-hydroxy    Referral Orders  No referral(s) requested today    Raliegh Ip,  MSN, FNP-BC [facility] [phone]  [address]  [place], [place] [id number] [phone] fax   Problem List Items Addressed This Visit      Cardiovascular and Mediastinum   Essential hypertension - Primary   Relevant Medications   amLODipine (NORVASC) 10 MG tablet   Other Relevant Orders    Comprehensive metabolic panel (Completed)   TSH (Completed)   Lipid Panel (Completed)   Vitamin B12 (Completed)   Vitamin D, 25-hydroxy (Completed)     Other   Shortness of breath    Other Visit Diagnoses    Blood pressure check       Anxiety       Relevant Medications   FLUoxetine (PROZAC) 20 MG tablet   Need for influenza vaccination       Relevant Orders   Flu Vaccine QUAD 6+ mos PF IM (Fluarix Quad PF) (Completed)   Encounter for screening mammogram for breast cancer       Follow up          Meds ordered this encounter  Medications  . FLUoxetine (PROZAC) 20 MG tablet    Sig: Take 1 tablet (20 mg total) by mouth daily.    Dispense:  30 tablet    Refill:  3  . amLODipine (NORVASC) 10 MG tablet    Sig: Take 1 tablet (10 mg total) by mouth daily.    Dispense:  30 tablet    Refill:  3    Follow-up: Return in about 6 months (around 05/01/2019).    Kallie LocksNatalie M Ninoska Goswick, FNP

## 2018-11-02 LAB — LIPID PANEL
Chol/HDL Ratio: 3.9 ratio (ref 0.0–4.4)
Cholesterol, Total: 209 mg/dL — ABNORMAL HIGH (ref 100–199)
HDL: 54 mg/dL (ref >39)
LDL Chol Calc (NIH): 139 mg/dL — ABNORMAL HIGH (ref 0–99)
Triglycerides: 92 mg/dL (ref 0–149)
VLDL Cholesterol Cal: 16 mg/dL (ref 5–40)

## 2018-11-02 LAB — COMPREHENSIVE METABOLIC PANEL
ALT: 57 IU/L — ABNORMAL HIGH (ref 0–32)
AST: 26 IU/L (ref 0–40)
Albumin/Globulin Ratio: 1.1 — ABNORMAL LOW (ref 1.2–2.2)
Albumin: 4.2 g/dL (ref 3.8–4.8)
Alkaline Phosphatase: 115 IU/L (ref 39–117)
BUN/Creatinine Ratio: 13 (ref 9–23)
BUN: 11 mg/dL (ref 6–24)
Bilirubin Total: 0.2 mg/dL (ref 0.0–1.2)
CO2: 25 mmol/L (ref 20–29)
Calcium: 9.7 mg/dL (ref 8.7–10.2)
Chloride: 98 mmol/L (ref 96–106)
Creatinine, Ser: 0.83 mg/dL (ref 0.57–1.00)
GFR calc Af Amer: 98 mL/min/{1.73_m2} (ref >59)
GFR calc non Af Amer: 85 mL/min/{1.73_m2} (ref >59)
Globulin, Total: 3.7 g/dL (ref 1.5–4.5)
Glucose: 99 mg/dL (ref 65–99)
Potassium: 3.2 mmol/L — ABNORMAL LOW (ref 3.5–5.2)
Sodium: 139 mmol/L (ref 134–144)
Total Protein: 7.9 g/dL (ref 6.0–8.5)

## 2018-11-02 LAB — VITAMIN D 25 HYDROXY (VIT D DEFICIENCY, FRACTURES): Vit D, 25-Hydroxy: 13.3 ng/mL — ABNORMAL LOW (ref 30.0–100.0)

## 2018-11-02 LAB — TSH: TSH: 1 u[IU]/mL (ref 0.450–4.500)

## 2018-11-02 LAB — VITAMIN B12: Vitamin B-12: 392 pg/mL (ref 232–1245)

## 2018-11-05 ENCOUNTER — Other Ambulatory Visit: Payer: Self-pay | Admitting: Family Medicine

## 2018-11-05 ENCOUNTER — Encounter: Payer: Self-pay | Admitting: Family Medicine

## 2018-11-05 DIAGNOSIS — E559 Vitamin D deficiency, unspecified: Secondary | ICD-10-CM

## 2018-11-05 MED ORDER — VITAMIN D (ERGOCALCIFEROL) 1.25 MG (50000 UNIT) PO CAPS: 50000.0000 [IU] | ORAL_CAPSULE | ORAL | 6 refills | Status: DC

## 2018-11-06 ENCOUNTER — Other Ambulatory Visit (HOSPITAL_COMMUNITY): Payer: Self-pay | Admitting: *Deleted

## 2018-11-06 DIAGNOSIS — N644 Mastodynia: Secondary | ICD-10-CM

## 2018-11-06 MED FILL — VIT D2 1.25 MG (50,000 UNIT: 1.25 MG | 84 days supply | Qty: 12 | Fill #0

## 2018-11-08 ENCOUNTER — Ambulatory Visit: Payer: Self-pay | Admitting: Internal Medicine

## 2018-11-11 ENCOUNTER — Ambulatory Visit: Payer: Medicaid Other | Admitting: Internal Medicine

## 2018-11-12 ENCOUNTER — Encounter: Payer: Self-pay | Admitting: Internal Medicine

## 2018-11-12 ENCOUNTER — Other Ambulatory Visit: Payer: Self-pay

## 2018-11-12 ENCOUNTER — Ambulatory Visit (INDEPENDENT_AMBULATORY_CARE_PROVIDER_SITE_OTHER): Payer: Medicaid Other | Admitting: Internal Medicine

## 2018-11-12 DIAGNOSIS — R05 Cough: Secondary | ICD-10-CM

## 2018-11-12 DIAGNOSIS — R059 Cough, unspecified: Secondary | ICD-10-CM

## 2018-11-12 DIAGNOSIS — R0602 Shortness of breath: Secondary | ICD-10-CM

## 2018-11-12 MED FILL — TELMISARTAN 40 MG TABS: 40 | 30 days supply | Qty: 30 | Fill #1

## 2018-11-12 NOTE — Assessment & Plan Note (Signed)
Onset cough March 2020 - d/c acei 06/25/2018 and max rx for gerd  - cyclical cough rx 05/07/5828  - Allergy profile 07/25/2018 >  Eos 0.3 /  IgE 52 with RAST pos ragweed, grass, trees dust  - 08/29/2018  After extensive coaching inhaler device,  effectiveness =    75% try symb 80 2bid x 2 weeks > no improvement in cough or doe so d/c 09/17/2018 > cough 95% resolved 10/11/2018 s codeine x at hs  - 11/12/2018 cough remains resolved, still on gabapentin but no cough meds/ no inhalers    Adequate control on present rx, reviewed in detail with pt > no change in rx needed

## 2018-11-12 NOTE — Assessment & Plan Note (Signed)
Onset March 2020 assoc with [facility] 09/17/2018   Walked RA  2 laps @  approx 257ft each @ avg  pace  stopped due to  End of study, sob and cough but sats still 98%    Improving but needs to do more regular submax ex to improve ex tol/ calorie balance   Each maintenance medication was reviewed in detail including most importantly the difference between maintenance and as needed and under what circumstances the prns are to be used.  Please see AVS for specific  Instructions which are unique to this visit and I personally typed out  which were reviewed in detail over the phone with the patient and a copy provided via my chart

## 2018-11-12 NOTE — Progress Notes (Signed)
Catherine Kane, female    DOB: January 30, 1971      MRN: [id number]   Brief patient profile:  53 yobf never smoker ? 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 since when?  and around 2005 noted onset cough dx of asthma in [place] with season change typically exac with uri  And in no need for any maint rx other protonix but then while on ppi April 25 2018 severe cough /sob refractory to saba hfa/neb/singulair/flonase/ zyrtec/no prednisone > put out of work since March 19th and not worked since and 4 ov's with HCP's  So self referred to pulmonary 06/25/2018       History of Present Illness  06/25/2018  Pulmonary/ 1st office eval/Catherine Kane  Chief Complaint  Patient presents with  . Pulmonary Consult    Self referral. Pt c/o cough and increased SOB for the past several wks. Cough is non prod.   Dyspnea:  Mailbox and back prior to March 19 no problem, flat 50 ft ok now struggles /makes her cough Cough: dry/ day > noct more with movement , sitting fine, sleeping fine / speaking makes it worse / so does laughing/ blowing up balloon SABA use: 2 hfa's 1 neb no better / pred always helps some for a few days at leas rec Stop lisinopril and start micardis 40 mg one daily  Prednisone 10 mg take  4 each am x 2 days,   2 each am x 2 days,  1 each am x 2 days and stop  Change protonix to 40 mg Take 30-60 min before first meal of the day and pepcid 20 mg after supper until you return  Best cough syrup is delsym 2tsp every 12 hours as needed  GERD  Only use your albuterol as a rescue medication Please schedule a follow up office visit in 6 weeks, call sooner if needed with all medications /inhalers/ solutions in hand so we can verify exactly what you are taking. This includes all medications from all doctors and over the counters    07/10/2018  f/u ov/Catherine Kane re: severe cough not resp to gabapentin  300 tid and delsym  Chief Complaint  Patient presents with  . Follow-up    Patient states that her dry cough is about the same since last visit. She states that she uses her resue inhaler twice daily.  Dyspnea:  Mostly sob when coughing Cough: dry honking 24/7  Sleeping: on side wedge pillow  SABA use: albuterol not helping rec No work for 2 weeks  Depomedrol 120 mg IM today  Take delsym two tsp every 12 hours and supplement if needed with  tramadol 50 mg up to 2 every 4 hours to suppress the urge to cough. Once you have eliminated the cough for 3 straight days try reducing the tramadol first,  then the delsym as tolerated.   For drainage / throat tickle try take CHLORPHENIRAMINE  4 mg  (Chlortab 4mg   at [facility] should be easiest to find in the green box)  take one every 4 hours as needed      07/25/2018 acute extended ov/Catherine Kane re:  Severe cough off ace x 4 weeks Chief Complaint  Patient presents with  . Acute Visit    Increased cough over the past 4 days- non prod.   tickle when lie down only taking  1 h1 at hs (instructions were to take up to 2 ) no better with saba  Sleeping on wedge   Not limited by breathing from desired activities   rec For drainage / throat tickle try take CHLORPHENIRAMINE  4 mg  Increase gabapentin to 300 mg 4 x daily  Increase pepcid to 10 mg x 2 x 1 hours before bedtime  depomedrol 120 mg    08/08/2018  f/u ov/Catherine Kane re: off ace x 6 weeks  maint on singulair /  Improving cough  Chief Complaint  Patient presents with  . Follow-up    Cough has improved some.   Dyspnea:  Limited by back not breath Cough: 95% better, did cough with grass exp/laughing w/in one week of ov  Sleeping: fine on wedge  SABA use: none 02: none  rec No change rx  Return to July 18th, coughing same day working than she does at home    08/29/2018  f/u ov/Catherine Kane re: uacs vs cough variant asthma Chief Complaint  Patient presents with  . Acute Visit    Pt states she is  continuing to have dry cough and SOB. She states she can not work at all due to these symptoms.   Dyspnea:  MMRC3 = can't walk 100 yards even at a slow pace at a flat grade s stopping due to sob  /back pain  Cough: harsh honking no mucus Sleeping: on wedge/ recliner worse flat coughing/ gasping SABA use: albterol not helping  02: none  Reports better p depomedrol rx  rec Depomedrol 120 mg IM  For drainage / throat tickle try take CHLORPHENIRAMINE  4 mg  (Chlortab 4mg   at [facility] should be easiest to find in the green box)  take one every 4 hours as needed - available over the counter- may cause drowsiness so start with just a bedtime dose or two and see how you tolerate it before trying in daytime   Symbicort 80 Take 2 puffs first thing in am and then another 2 puffs about 12 hours later.  Please schedule a follow up office visit in 2 weeks, sooner if needed  with all medications /inhalers/ solutions in hand so we can verify exactly what you are taking. This includes all medications from all doctors and over the counters    09/17/2018  f/u ov/Catherine Kane re:  Prior March 2020 no need any resp rx / still tickle in throat / avg tramadol 5 per day  Chief Complaint  Patient presents with  . Follow-up    doing ok "as long as I don't move"  Dyspnea:  50 ft even if not coughing  Cough: dry honking sound  Sleeping: wedge x 30 degrees since early March 2020 /  SABA use: albuterol not helping not even neb  02: none  rec Depomedrol 120 mg IM today and stop symbicort Take delsym two tsp every 12 hours and supplement if needed with  Tylenol #3   up to 1-2 every 4 hoursd.    Continue gabapentin 300 mg four times daily     10/11/2018  f/u ov/Catherine Kane re: cough x March 2020 Chief Complaint  Patient presents with  . Follow-up    Cough is much improved- has not coughed in a wk.   Dyspnea:  MMRC1 = can walk nl pace, flat grade, can't hurry or go uphills or steps s sob   Cough: only tylenol #3 x one at  hs s delsym or h1 at hs Sleeping: wedge  SABA use: none  02: none  rec Continuous leave 09/13/2018 until 10/25/18 and should only do isolation rooms once a week  because full PPE makes it hard to breathe.   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 Increase chlorpheniramine to 4 mg x 2 an hour before bed     Virtual Visit via Telephone Note 11/12/2018   I connected with Catherine Kane on 11/12/18 at 11:00 AM EDT by telephone and verified that I am speaking with the correct person using two identifiers.   I discussed the limitations, risks, security and privacy concerns of performing an evaluation and management service by telephone and the availability of in person appointments. I also discussed with the patient that there may be a patient responsible charge related to this service. The patient expressed understanding and agreed to proceed.   History of Present Illness: Dyspnea:  avg pace x [facility]  Cough: none/ no need for Tyl#3  Sleeping: no resp symptoms on wedge, can't get comfortable lying on back x year  SABA use: none  02: none  Still not able to return to work as can't breathe thru PPE   No obvious day to day or daytime variability or assoc excess/ purulent sputum or mucus plugs or hemoptysis or cp or chest tightness, subjective wheeze or overt sinus or hb symptoms.    Also denies any obvious fluctuation of symptoms with weather or environmental changes or other aggravating or alleviating factors except as outlined above.   Meds reviewed/ med reconciliation completed         Observations/Objective: Sounds good on phone, nl phonation, no increased wob    Assessment and Plan: See problem list for active a/p's   Follow Up Instructions: See avs for instructions unique to this ov which includes revised/ updated med list     I discussed the assessment and treatment plan  with the patient. The patient was provided an opportunity to ask questions and all were answered. The patient agreed with the plan and demonstrated an understanding of the instructions.   The patient was advised to call back or seek an in-person evaluation if the symptoms worsen or if the condition fails to improve as anticipated.  I provided 25 minutes of non-face-to-face time during this encounter.   Sandrea Hughs, MD

## 2018-11-12 NOTE — Patient Instructions (Addendum)
Pt has MyChart     To get the most out of exercise, you need to be continuously aware that you are short of breath, but never out of breath, for 30 minutes daily. As you improve, it will actually be easier for you to do the same amount of exercise  in  30 minutes so always push to the level where you are short of breath.  in steady weight loss programs.      No change in medications    Please schedule a follow up office visit in 6 weeks, call sooner if needed with all medications /inhalers/ solutions in hand so we can verify exactly what you are taking. This includes all medications from all doctors and over the counters

## 2018-11-13 ENCOUNTER — Telehealth: Payer: Medicaid Other | Admitting: Family

## 2018-11-13 DIAGNOSIS — M549 Dorsalgia, unspecified: Secondary | ICD-10-CM

## 2018-11-13 MED ORDER — BACLOFEN 10 MG PO TABS: 10.0000 mg | ORAL_TABLET | Freq: Three times a day (TID) | ORAL | 0 refills | Status: DC

## 2018-11-13 MED ORDER — NAPROXEN 500 MG PO TABS: 500.0000 mg | ORAL_TABLET | Freq: Two times a day (BID) | ORAL | 0 refills | Status: DC

## 2018-11-13 MED FILL — NAPROXEN 500 MG TABLET: 500 | 15 days supply | Qty: 30 | Fill #0

## 2018-11-13 MED FILL — BACLOFEN 10 MG TABLET: 10 | 10 days supply | Qty: 30 | Fill #0

## 2018-11-13 NOTE — Progress Notes (Signed)
We are sorry that you are not feeling well.  Here is how we plan to help!  Based on what you have shared with me it looks like you mostly have acute back pain.  Acute back pain is defined as musculoskeletal pain that can resolve in 1-3 weeks with conservative treatment.  I have prescribed Naprosyn 500 mg twice a day non-steroid anti-inflammatory (NSAID) as well as Baclofen 10 mg every eight hours as needed which is a muscle relaxer  Some patients experience stomach irritation or in increased heartburn with anti-inflammatory drugs.  Please keep in mind that muscle relaxer's can cause fatigue and should not be taken while at work or driving.  Back pain is very common.  The pain often gets better over time.  The cause of back pain is usually not dangerous.  Most people can learn to manage their back pain on their own.  Approximately 5 minutes was spent documenting and reviewing patient's chart.   If your back pain worsens or does not improve you need to be seen face-to-face. Sometimes after a motor vehicle accident you can feel stiffness and muscle soreness can be  normal but if the pain is severe and becomes worse you need to go to the emergency room.   Home Care  Stay active.  Start with short walks on flat ground if you can.  Try to walk farther each day.  Do not sit, drive or stand in one place for more than 30 minutes.  Do not stay in bed.  Do not avoid exercise or work.  Activity can help your back heal faster.  Be careful when you bend or lift an object.  Bend at your knees, keep the object close to you, and do not twist.  Sleep on a firm mattress.  Lie on your side, and bend your knees.  If you lie on your back, put a pillow under your knees.  Only take medicines as told by your doctor.  Put ice on the injured area.  Put ice in a plastic bag  Place a towel between your skin and the bag  Leave the ice on for 15-20 minutes, 3-4 times a day for the first 2-3 days. 210 After that,  you can switch between ice and heat packs.  Ask your doctor about back exercises or massage.  Avoid feeling anxious or stressed.  Find good ways to deal with stress, such as exercise.  Get Help Right Way If:  Your pain does not go away with rest or medicine.  Your pain does not go away in 1 week.  You have new problems.  You do not feel well.  The pain spreads into your legs.  You cannot control when you poop (bowel movement) or pee (urinate)  You feel sick to your stomach (nauseous) or throw up (vomit)  You have belly (abdominal) pain.  You feel like you may pass out (faint).  If you develop a fever.  Make Sure you:  Understand these instructions.  Will watch your condition  Will get help right away if you are not doing well or get worse.  Your e-visit answers were reviewed by a board certified advanced clinical practitioner to complete your personal care plan.  Depending on the condition, your plan could have included both over the counter or prescription medications.  If there is a problem please reply  once you have received a response from your provider.  Your safety is important to [place].  If you have  drug allergies check your prescription carefully.    You can use MyChart to ask questions about today's visit, request a non-urgent call back, or ask for a work or school excuse for 24 hours related to this e-Visit. If it has been greater than 24 hours you will need to follow up with your provider, or enter a new e-Visit to address those concerns.  You will get an e-mail in the next two days asking about your experience.  I hope that your e-visit has been valuable and will speed your recovery. Thank you for using e-visits.

## 2018-11-25 ENCOUNTER — Telehealth: Payer: Self-pay | Admitting: Internal Medicine

## 2018-11-25 NOTE — Telephone Encounter (Signed)
I called the patient and advised her of Dr. Gustavus Bryant response. Patient stated the employer is trying to work with her to figure out what will be appropriate. Patient stated she cannot wear a mask for a full 8 hour shift and wanted to know if she can get something that says she can take the mask off for 5 minutes to get some air after having worked for 30 minutes.  I advised the patient that we are not aware of what her work structure is or level of responsibilities. I advised Dr. Melvyn Novas cannot issue a letter saying she cannot wear a mask which could be open to interpretation and could become a problem for her and her employer.  I suggested she talk to her employer to put in writing what her work schedule is an how many breaks she is allowed daily, as that is not something Dr. Melvyn Novas can determine. Patient voice understanding and will contact her employer about putting a letter together for her to drop off at clinic for Dr. Melvyn Novas to review and then be able to base the letter requested off of that.

## 2018-11-25 NOTE — Telephone Encounter (Signed)
Pt returning call.  [phone]

## 2018-11-25 NOTE — Telephone Encounter (Signed)
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   If she knows of accommodations they can make for her so she can take mask off for breaks then I'm happy to support that for sure.

## 2018-11-25 NOTE — Telephone Encounter (Signed)
Called and spoke to patient. Patient stated that she is planning to return to work but is needing a letter about how long she should wear her mask.  Patient would like letter to say that she needs a breat every 30 minutes.    Dr. Melvyn Novas please advise. Thanks!

## 2018-11-25 NOTE — Telephone Encounter (Signed)
LMTCB

## 2018-11-26 ENCOUNTER — Encounter (HOSPITAL_COMMUNITY): Payer: Self-pay

## 2018-11-26 ENCOUNTER — Ambulatory Visit (HOSPITAL_COMMUNITY)
Admission: RE | Admit: 2018-11-26 | Discharge: 2018-11-26 | Disposition: A | Discharge status: Home / Self Care | Payer: Medicaid Other | Source: Ambulatory Visit | Attending: Obstetrics and Gynecology | Admitting: Obstetrics and Gynecology

## 2018-11-26 ENCOUNTER — Ambulatory Visit
Admission: RE | Admit: 2018-11-26 | Discharge: 2018-11-26 | Disposition: A | Discharge status: Home / Self Care | Payer: Self-pay | Source: Ambulatory Visit | Attending: Obstetrics and Gynecology | Admitting: Obstetrics and Gynecology

## 2018-11-26 ENCOUNTER — Ambulatory Visit
Admission: RE | Admit: 2018-11-26 | Discharge: 2018-11-26 | Disposition: A | Discharge status: Home / Self Care | Payer: Medicaid Other | Source: Ambulatory Visit | Attending: Obstetrics and Gynecology | Admitting: Obstetrics and Gynecology

## 2018-11-26 ENCOUNTER — Other Ambulatory Visit: Payer: Self-pay

## 2018-11-26 DIAGNOSIS — N644 Mastodynia: Secondary | ICD-10-CM

## 2018-11-26 DIAGNOSIS — Z01419 Encounter for gynecological examination (general) (routine) without abnormal findings: Secondary | ICD-10-CM | POA: Insufficient documentation

## 2018-11-26 DIAGNOSIS — N6325 Unspecified lump in the left breast, overlapping quadrants: Secondary | ICD-10-CM | POA: Insufficient documentation

## 2018-11-26 HISTORY — DX: Encounter for gynecological examination (general) (routine) without abnormal findings: Z01.419

## 2018-11-26 NOTE — Patient Instructions (Signed)
Explained breast self awareness with [name]. Let patient know [facility] will cover Pap smears and HPV typing every 5 years unless has a history of abnormal Pap smears. Referred patient to the [facility] for a diagnostic mammogram and left breast ultrasound. Appointment scheduled for Tuesday, November 26, 2018 at 0920. Patient aware of appointment and will be there. Let patient know will follow up with her within the next couple weeks with results of Pap smear by letter or phone. Catherine Kane verbalized understanding.  [name], [name], RN 8:41 AM

## 2018-11-26 NOTE — Progress Notes (Signed)
Complaints of left breast lump and pain x 2 months. Patient states the pain comes and goes. Patient rates the pain at a 3 out of 10.  Pap Smear: Pap smear completed today. Last Pap smear was over 5 years ago at [facility] in [place] and normal per patient. Per patient has no history of an abnormal Pap smear. No Pap smear results are in Epic.  Physical exam: Breasts Left breast slightly larger than right breast that per patient is normal for her. No skin abnormalities bilateral breasts. No nipple retraction bilateral breasts. No nipple discharge bilateral breasts. No lymphadenopathy. No lumps palpated bright breast. Palpated a mobile lump within the left breast at 12 o'clock 8 cm from the nipple. Complaints of tenderness when palpated lump. Referred patient to the [facility] for a diagnostic mammogram and left breast ultrasound. Appointment scheduled for Tuesday, November 26, 2018 at 0920.        Pelvic/Bimanual   Ext Genitalia No lesions, no swelling and no discharge observed on external genitalia.        Vagina Vagina pink and normal texture. No lesions and brownish colored old blood observed in vagina consistent with the end of patients menstrual period.     Cervix Cervix is present. Cervix pink and of normal texture. Brownish colored old blood observed on cervical os consistent with the end of patients menstrual period.    Uterus Uterus is present and palpable. Uterus in normal position and normal size.        Adnexae Bilateral ovaries present and palpable. No tenderness on palpation.         Rectovaginal No rectal exam completed today since patient had no rectal complaints. No skin abnormalities observed on exam.    Smoking History: Patient has never smoked.  Patient Navigation: Patient education provided. Access to services provided for patient through [facility] program.   Breast and Cervical Cancer Risk Assessment: Patient has a family history of a maternal  aunt and her maternal grandmother having breast cancer. Patient has no known genetic mutations or history of radiation treatment to the chest before age 30. Patient has no history of cervical dysplasia, immunocompromised, or DES exposure in-utero.  Risk Assessment    Risk Scores      11/26/2018   Last edited by: Loletta Parish, RN   5-year risk: 0.9 %   Lifetime risk: 9.3 %

## 2018-11-26 NOTE — Addendum Note (Signed)
Encounter addended by: Loletta Parish, RN on: 11/26/2018 11:35 AM  Actions taken: Actions taken from a BestPractice Advisory, Allergies reviewed, Order list changed

## 2018-11-28 LAB — CYTOLOGY - PAP
Comment: NEGATIVE
Diagnosis: NEGATIVE
High risk HPV: NEGATIVE

## 2018-12-06 ENCOUNTER — Telehealth (HOSPITAL_COMMUNITY): Payer: Self-pay | Admitting: *Deleted

## 2018-12-06 NOTE — Telephone Encounter (Signed)
Called patient and gave her Pap smear results. Let patient know that her Pap smear was normal and HPV negative. Informed patient that her next Pap smear is due in 5 years. Patient verbalized understanding. 

## 2018-12-22 DIAGNOSIS — B342 Coronavirus infection, unspecified: Secondary | ICD-10-CM

## 2018-12-22 HISTORY — DX: Coronavirus infection, unspecified: B34.2

## 2018-12-23 ENCOUNTER — Telehealth: Payer: Self-pay | Admitting: Family Medicine

## 2018-12-23 ENCOUNTER — Telehealth: Payer: Self-pay | Admitting: Internal Medicine

## 2018-12-23 NOTE — Telephone Encounter (Signed)
Pt advised per Natalie's note.

## 2018-12-23 NOTE — Telephone Encounter (Signed)
Called and spoke with pt letting her know that MW wants [place] to change her appt to be done tomorrow as a televisit prior to starting steroids. Pt verbalized understanding. Appt changed to tomorrow, 11/17 at 3:30 for a televisit. Nothing further needed.

## 2018-12-23 NOTE — Telephone Encounter (Signed)
Called and spoke with pt who stated she went to have covid test Friday, 11/13 and found out yesterday 11/15 that she tested positive.  Pt said when she was told that she tested positive that it would be good for her to contact MW to see if he could prescribe her some steroids to help keep her airways open due to her lung condition.   Pt said that she has had a cough and also feels like she has a head cold. Pt said that she has not been running any temp. Her temp yesterday was 98.6 but that is with her taking tylenol.  Pt said she is going to be having covid test done again 11/22 to see if she still receives a positive test or if it will come back negative and pt said she is doing the home quarantine.  Pt does have an appt scheduled with MW 11/20 and needs to know if this appt should fully be rescheduled or if we could make this either a televisit or video visit.  Dr. Melvyn Novas, please advise on all this for pt. Thanks!

## 2018-12-23 NOTE — Telephone Encounter (Signed)
Convert to televisit for tomorrow  - no indication for steroids yet.

## 2018-12-24 ENCOUNTER — Ambulatory Visit (INDEPENDENT_AMBULATORY_CARE_PROVIDER_SITE_OTHER): Payer: Self-pay | Admitting: Internal Medicine

## 2018-12-24 ENCOUNTER — Other Ambulatory Visit: Payer: Self-pay

## 2018-12-24 ENCOUNTER — Encounter: Payer: Self-pay | Admitting: Internal Medicine

## 2018-12-24 DIAGNOSIS — R05 Cough: Secondary | ICD-10-CM

## 2018-12-24 DIAGNOSIS — R059 Cough, unspecified: Secondary | ICD-10-CM

## 2018-12-24 MED ORDER — PREDNISONE 10 MG PO TABS: ORAL_TABLET | ORAL | 0 refills | Status: DC

## 2018-12-24 MED ORDER — ACETAMINOPHEN-CODEINE #3 300-30 MG PO TABS: 1.0000 | ORAL_TABLET | ORAL | 0 refills | Status: DC | PRN

## 2018-12-24 NOTE — Patient Instructions (Addendum)
Prednisone 10 mg take  4 each am x 2 days,   2 each am x 2 days,  1 each am x 2 days and stop   Take delsym two tsp every 12 hours and supplement if needed with  Tylenol #3   up to 1-2 every 4 hours to suppress the urge to cough. Swallowing water and/or using ice chips/non mint and menthol containing candies (such as lifesavers or sugarless jolly ranchers) are also effective.  You should rest your voice and avoid activities that you know make you cough.  Once you have eliminated the cough for 3 straight days try reducing the Tylenol #3 first,  then the delsym as tolerated.   Broth based soups/ noodles crackers   Please schedule a follow up office visit in 2 weeks, sooner if needed  with all medications /inhalers/ solutions in hand so we can verify exactly what you are taking. This includes all medications from all doctors and over the counters

## 2018-12-24 NOTE — Assessment & Plan Note (Signed)
Onset cough March 2020 - d/c acei 06/25/2018 and max rx for gerd  - cyclical cough rx 02/10/4816  - Allergy profile 07/25/2018 >  Eos 0.3 /  IgE 52 with RAST pos ragweed, grass, trees dust  - 08/29/2018  After extensive coaching inhaler device,  effectiveness =    75% try symb 80 2bid x 2 weeks > no improvement in cough or doe so d/c 09/17/2018 > cough 95% resolved 10/11/2018 s codeine x at hs  - 11/12/2018 cough remains resolved, still on gabapentin but no cough meds/ no inhalers  - recurred 12/18/18 with dx of COVID 19 rx : 12/24/2018  pred x 6 days, clear liquids, cough sup with Tyl #3   Of the three most common causes of  Sub-acute / recurrent or chronic cough, only one (GERD)  can actually contribute to/ trigger  the other two (asthma and post nasal drip syndrome)  and perpetuate the cylce of cough.  While not intuitively obvious, many patients with chronic low grade reflux do not cough until there is a primary insult that disturbs the protective epithelial barrier and exposes sensitive nerve endings.   This is typically viral but can due to PNDS and  either may apply here.   The point is that once this occurs, it is difficult to eliminate the cycle  using anything but a maximally effective acid suppression regimen at least in the short run, accompanied by an appropriate diet to address non acid GERD and control / eliminate the cough itself for at least 3 days with tyl #3 and also added 6 days of Prednisone in case of component of Th-2 driven upper or lower airways inflammation (if cough responds short term only to relapse befor return while will on rx for uacs that would point to allergic rhinitis/ asthma or eos bronchitis) .  F/u in 2 weeks with all meds in hand    Each maintenance medication was reviewed in detail including most importantly the difference between maintenance and as needed and under what circumstances the prns are to be used.  Please see AVS for specific  Instructions which are unique  to this visit and I personally typed out  which were reviewed in detail over the phone with the patient and a copy provided via My chart

## 2018-12-24 NOTE — Progress Notes (Signed)
Catherine Kane, female    DOB: 06-12-1970      MRN: [id number]   Brief patient profile:  62 yobf never smoker ? 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 since when?  and around 2005 noted onset cough dx of asthma in [place] with season change typically exac with uri  And in no need for any maint rx other protonix but then while on ppi April 25 2018 severe cough /sob refractory to saba hfa/neb/singulair/flonase/ zyrtec/no prednisone > put out of work since March 19th and not worked since and 4 ov's with HCP's  So self referred to pulmonary 06/25/2018       History of Present Illness  06/25/2018  Pulmonary/ 1st office eval/Catherine Kane  Chief Complaint  Patient presents with  . Pulmonary Consult    Self referral. Pt c/o cough and increased SOB for the past several wks. Cough is non prod.   Dyspnea:  Mailbox and back prior to March 19 no problem, flat 50 ft ok now struggles /makes her cough Cough: dry/ day > noct more with movement , sitting fine, sleeping fine / speaking makes it worse / so does laughing/ blowing up balloon SABA use: 2 hfa's 1 neb no better / pred always helps some for a few days at leas rec Stop lisinopril and start micardis 40 mg one daily  Prednisone 10 mg take  4 each am x 2 days,   2 each am x 2 days,  1 each am x 2 days and stop  Change protonix to 40 mg Take 30-60 min before first meal of the day and pepcid 20 mg after supper until you return  Best cough syrup is delsym 2tsp every 12 hours as needed  GERD  Only use your albuterol as a rescue medication Please schedule a follow up office visit in 6 weeks, call sooner if needed with all medications /inhalers/ solutions in hand so we can verify exactly what you are taking. This includes all medications from all doctors and over the counters    07/10/2018  f/u ov/Catherine Kane re: severe cough not resp to gabapentin  300 tid and delsym  Chief Complaint  Patient presents with  . Follow-up    Patient states that her dry cough is about the same since last visit. She states that she uses her resue inhaler twice daily.  Dyspnea:  Mostly sob when coughing Cough: dry honking 24/7  Sleeping: on side wedge pillow  SABA use: albuterol not helping rec No work for 2 weeks  Depomedrol 120 mg IM today  Take delsym two tsp every 12 hours and supplement if needed with  tramadol 50 mg up to 2 every 4 hours to suppress the urge to cough. Once you have eliminated the cough for 3 straight days try reducing the tramadol first,  then the delsym as tolerated.   For drainage / throat tickle try take CHLORPHENIRAMINE  4 mg  (Chlortab   at [employer] should be easiest to find in the green box)  take one every 4 hours as needed      07/25/2018 acute extended ov/Catherine Kane re:  Severe cough off ace x 4 weeks Chief Complaint  Patient presents with  . Acute Visit    Increased cough over the past 4 days- non prod.   tickle when lie down only taking  1 h1 at hs (instructions were to take up to 2 ) no better with saba  Sleeping on wedge   Not limited by breathing from desired activities   rec For drainage / throat tickle try take CHLORPHENIRAMINE  4 mg  Increase gabapentin to 300 mg 4 x daily  Increase pepcid to 10 mg x 2 x 1 hours before bedtime  depomedrol 120 mg    08/08/2018  f/u ov/Catherine Kane re: off ace x 6 weeks  maint on singulair /  Improving cough  Chief Complaint  Patient presents with  . Follow-up    Cough has improved some.   Dyspnea:  Limited by back not breath Cough: 95% better, did cough with grass exp/laughing w/in one week of ov  Sleeping: fine on wedge  SABA use: none 02: none  rec No change rx  Return to July 18th, coughing same day working than she does at home    08/29/2018  f/u ov/Catherine Kane re: uacs vs cough variant asthma Chief Complaint  Patient presents with  . Acute Visit    Pt states she is  continuing to have dry cough and SOB. She states she can not work at all due to these symptoms.   Dyspnea:  MMRC3 = can't walk 100 yards even at a slow pace at a flat grade s stopping due to sob  /back pain  Cough: harsh honking no mucus Sleeping: on wedge/ recliner worse flat coughing/ gasping SABA use: albterol not helping  02: none  Reports better p depomedrol rx  rec Depomedrol 120 mg IM  For drainage / throat tickle try take CHLORPHENIRAMINE  4 mg  ([facility]lortab 4mg   [facility] should be easiest to find in the green box)  take one every 4 hours as needed - available over the counter- may cause drowsiness so start with just a bedtime dose or two and see how you tolerate it before trying in daytime   Symbicort 80 Take 2 puffs first thing in am and then another 2 puffs about 12 hours later.  Please schedule a follow up office visit in 2 weeks, sooner if needed  with all medications /inhalers/ solutions in hand so we can verify exactly what you are taking. This includes all medications from all doctors and over the counters    09/17/2018  f/u ov/Catherine Kane re:  Prior March 2020 no need any resp rx / still tickle in throat / avg tramadol 5 per day  Chief Complaint  Patient presents with  . Follow-up    doing ok "as long as I don't move"  Dyspnea:  50 ft even if not coughing  Cough: dry honking sound  Sleeping: wedge x 30 degrees since early March 2020 /  SABA use: albuterol not helping not even neb  02: none  rec Depomedrol 120 mg IM today and stop symbicort Take delsym two tsp every 12 hours and supplement if needed with  Tylenol #3   up to 1-2 every 4 hoursd.    Continue gabapentin 300 mg four times daily     10/11/2018  f/u ov/Catherine Kane re: cough x March 2020 Chief Complaint  Patient presents with  . Follow-up    Cough is much improved- has not coughed in a wk.   Dyspnea:  MMRC1 = can walk nl pace, flat grade, can't hurry or go uphills or steps s sob   Cough: only tylenol #3 x one at  hs s delsym or h1 at hs Sleeping: wedge  SABA use: none  02: none  rec Continuous leave 09/13/2018 until 10/25/18 and should only do isolation rooms once a week  because full PPE makes it hard to breathe.   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 Increase chlorpheniramine to 4 mg x 2 an hour before bed     Virtual Visit via Telephone Note 11/12/2018   I connected with Catherine Kane on 11/12/18 at 11:00 AM EDT by telephone and verified that I am speaking with the correct person using two identifiers.   I discussed the limitations, risks, security and privacy concerns of performing an evaluation and management service by telephone and the availability of in person appointments. I also discussed with the patient that there may be a patient responsible charge related to this service. The patient expressed understanding and agreed to proceed.   History of Present Illness: Dyspnea:  avg pace x [facility]  Cough: none/ no need for Tyl#3  Sleeping: no resp symptoms on wedge, can't get comfortable lying on back x year  SABA use: none  02: none  Still not able to return to work as can't breathe thru PPE rec To get the most out of exercise, you need to be continuously aware that you are short of breath, but never out of breath, for 30 minutes daily. As you improve, it will actually be easier for you to do the same amount of exercise  in  30 minutes so always push to the level where you are short of breath.  in steady weight loss programs. No change in medications  Please schedule a follow up office visit in 6 weeks, call sooner if needed with all medications /inhalers/ solutions in hand so we can verify exactly what you are taking. This includes all medications from all doctors and over the counters     Virtual Visit via Telephone Note 12/24/2018   I connected with Catherine Kane on 12/24/18  at 2 pm  by telephone and verified that I am speaking with the correct person using two identifiers.   I discussed the limitations, risks, security and privacy concerns of performing an evaluation and management service by telephone and the availability of in person appointments. I also discussed with the patient that there may be a patient responsible charge related to this service. The patient expressed understanding and agreed to proceed.   History of Present Illness: All better p last ov then Nov 8th had lunch with coworker that was sick then Nov 11th sore throat cough low and aches 11/13 tested and notified pos  grade fever  11/16 and none since but lost sense of smell, feels like a head cold but no mucus , diarrhea   no ha.  Dyspnea:  none Cough: non- productive  Sleeping: ok  SABA use: none 02: none    No obvious day to day or daytime variability or assoc excess/ purulent sputum or mucus plugs or hemoptysis or cp or chest tightness, subjective wheeze or overt sinus or hb symptoms.    Also denies any obvious fluctuation of symptoms with weather or environmental changes or other aggravating or alleviating factors except as outlined above.   Meds reviewed/ med reconciliation completed         Observations/Objective: Harsh dry sounding cough s wob, speaking in full sentences   Assessment and Plan: See problem list for active a/p's   Follow Up Instructions: See avs  for instructions unique to this ov which includes revised/ updated med list     I discussed the assessment and treatment plan with the patient. The patient was provided an opportunity to ask questions and all were answered. The patient agreed with the plan and demonstrated an understanding of the instructions.   The patient was advised to call back or seek an in-person evaluation if the symptoms worsen or if the condition fails to improve as anticipated.  I provided 25 minutes of non-face-to-face time during this  encounter.   Christinia Gully, MD

## 2018-12-25 MED FILL — ACETAMINOPHEN/COD #3 TABLET: 300-30 | 3 days supply | Qty: 40 | Fill #0

## 2018-12-25 MED FILL — predniSONE 10 MG TABS: 10 | 6 days supply | Qty: 14 | Fill #0

## 2018-12-27 ENCOUNTER — Ambulatory Visit: Payer: Medicaid Other | Admitting: Internal Medicine

## 2018-12-30 ENCOUNTER — Telehealth: Payer: Self-pay | Admitting: Internal Medicine

## 2018-12-30 NOTE — Telephone Encounter (Signed)
Called and spoke with pt who stated she was tested for covid 11/11 and found out 11/13 that she was positive.  Pt said that [employer] has been following her and stated she was told by them that they told her she is okay to return to work but they are still following her due to still coughing but before she could return she needed to get her cough under control.  Pt said she is planning on going back to work Friday, 11/27 depending on how her cough is.  Pt is wanting to know if she could be cleared to return back to work by [facility] and also if there is anything else he could recommend for her cough. Pt said that she is still currently taking all meds as prescribed by [facility].  Dr. Melvyn Novas, please advise on this for pt. Thanks!

## 2018-12-30 NOTE — Telephone Encounter (Signed)
Attempted to call pt but line went straight to VM. Left message for her to return call. 

## 2018-12-30 NOTE — Telephone Encounter (Signed)
Attempted to call pt but unable to reach. Left message for pt to return call. 

## 2018-12-30 NOTE — Telephone Encounter (Signed)
Too many questions to answer at this point s ov but since just tested pos for covid best to do this tomorrow with televist and have her get all meds together in front of her as I will need to know specfics like how many pills she has in the bottle to prescribe additional meds s seeing her face to face

## 2018-12-30 NOTE — Telephone Encounter (Signed)
Pt returning call.  [phone] °

## 2018-12-31 NOTE — Telephone Encounter (Signed)
Mychart visit made. Nothing further needed at this time.

## 2019-01-01 ENCOUNTER — Telehealth: Payer: Self-pay | Admitting: Family Medicine

## 2019-01-01 ENCOUNTER — Other Ambulatory Visit: Payer: Self-pay

## 2019-01-01 ENCOUNTER — Encounter: Payer: Self-pay | Admitting: Internal Medicine

## 2019-01-01 ENCOUNTER — Ambulatory Visit (INDEPENDENT_AMBULATORY_CARE_PROVIDER_SITE_OTHER): Payer: Self-pay | Admitting: Internal Medicine

## 2019-01-01 DIAGNOSIS — R059 Cough, unspecified: Secondary | ICD-10-CM

## 2019-01-01 DIAGNOSIS — R05 Cough: Secondary | ICD-10-CM

## 2019-01-01 MED ORDER — ACETAMINOPHEN-CODEINE #4 300-60 MG PO TABS: 1.0000 | ORAL_TABLET | ORAL | 0 refills | Status: DC | PRN

## 2019-01-01 MED ORDER — PREDNISONE 10 MG PO TABS: ORAL_TABLET | ORAL | 0 refills | Status: DC

## 2019-01-01 NOTE — Telephone Encounter (Signed)
done

## 2019-01-01 NOTE — Assessment & Plan Note (Signed)
Onset cough March 2020 - d/c acei 06/25/2018 and max rx for gerd  - cyclical cough rx 03/17/9240  - Allergy profile 07/25/2018 >  Eos 0.3 /  IgE 52 with RAST pos ragweed, grass, trees dust  - 08/29/2018  After extensive coaching inhaler device,  effectiveness =    75% try symb 80 2bid x 2 weeks > no improvement in cough or doe so d/c 09/17/2018 > cough 95% resolved 10/11/2018 s codeine x at hs  - 11/12/2018 cough remains resolved, still on gabapentin but no cough meds/ no inhalers  - recurred 12/18/18 with dx of COVID 19 rx : 12/24/2018  pred x 6 days, clear liquids, cough sup with Tyl #3  - repeat cyclical cough rx 68/34/1960 with tyl #4 / pred x 6 days    Of the three most common causes of  Sub-acute / recurrent or chronic cough, only one (GERD)  can actually contribute to/ trigger  the other two (asthma and post nasal drip syndrome)  and perpetuate the cylce of cough.  While not intuitively obvious, many patients with chronic low grade reflux do not cough until there is a primary insult that disturbs the protective epithelial barrier and exposes sensitive nerve endings.   This is typically viral but can due to PNDS and  either may apply here.   The point is that once this occurs, it is difficult to eliminate the cycle  using anything but a maximally effective acid suppression regimen at least in the short run, accompanied by an appropriate diet to address non acid GERD and control / eliminate the cough itself for at least 3 days with tyl #4 andcontinue gerd - Also added 6 days of Prednisone in case of component of Th-2 driven upper or lower airways inflammation (if cough responds short term only to relapse befor return while will on rx for uacs that would point to allergic rhinitis/ asthma or eos bronchitis)    If not better return in 2 weeks with all meds in hand using a trust but verify approach to confirm accurate Medication  Reconciliation The principal here is that until we are certain that the   patients are doing what we've asked, it makes no sense to ask them to do more.

## 2019-01-01 NOTE — Patient Instructions (Addendum)
Take delsym two tsp every 12 hours and supplement if needed with  Tylenol #4 up to 1 every 4 hours to suppress the urge to cough. Swallowing water and/or using ice chips/non mint and menthol containing candies (such as lifesavers or sugarless jolly ranchers) are also effective.  You should rest your voice and avoid activities that you know make you cough.  Once you have eliminated the cough for 3 straight days try reducing the Tylenol #4 first,  then the delsym as tolerated.    Prednisone 10 mg take  4 each am x 2 days,   2 each am x 2 days,  1 each am x 2 days and stop   Ok to go back to work on 01/06/2019  - if still coughing will need office visit with all meds in hand to regroup.

## 2019-01-01 NOTE — Progress Notes (Signed)
Catherine Kane, female    DOB: 12/27/1970      MRN: [id number]   Brief patient profile:  [age] yobf never smoker ? 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 since when?  and around 2005 noted onset cough dx of asthma in [place] with season change typically exac with uri  And in no need for any maint rx other protonix but then while on ppi April 25 2018 severe cough /sob refractory to saba hfa/neb/singulair/flonase/ zyrtec/no prednisone > put out of work since March 19th and not worked since and 4 ov's with HCP's  So self referred to pulmonary 06/25/2018       History of Present Illness  06/25/2018  Pulmonary/ 1st office eval/Talmadge Ganas  Chief Complaint  Patient presents with   Pulmonary Consult    Self referral. Pt c/o cough and increased SOB for the past several wks. Cough is non prod.   Dyspnea:  Mailbox and back prior to March 19 no problem, flat 50 ft ok now struggles /makes her cough Cough: dry/ day > noct more with movement , sitting fine, sleeping fine / speaking makes it worse / so does laughing/ blowing up balloon SABA use: 2 hfa's 1 neb no better / pred always helps some for a few days at leas rec Stop lisinopril and start micardis 40 mg one daily  Prednisone 10 mg take  4 each am x 2 days,   2 each am x 2 days,  1 each am x 2 days and stop  Change protonix to 40 mg Take 30-60 min before first meal of the day and pepcid 20 mg after supper until you return  Best cough syrup is delsym 2tsp every 12 hours as needed  GERD  Only use your albuterol as a rescue medication Please schedule a follow up office visit in 6 weeks, call sooner if needed with all medications /inhalers/ solutions in hand so we can verify exactly what you are taking. This includes all medications from all doctors and over the counters    07/10/2018  f/u ov/Jamerson Vonbargen re: severe cough not resp to gabapentin  300 tid and delsym  Chief Complaint  Patient presents with   Follow-up    Patient states that her dry cough is about the same since last visit. She states that she uses her resue inhaler twice daily.  Dyspnea:  Mostly sob when coughing Cough: dry honking 24/7  Sleeping: on side wedge pillow  SABA use: albuterol not helping rec No work for 2 weeks  Depomedrol 120 mg IM today  Take delsym two tsp every 12 hours and supplement if needed with  tramadol 50 mg up to 2 every 4 hours to suppress the urge to cough. Once you have eliminated the cough for 3 straight days try reducing the tramadol first,  then the delsym as tolerated.   For drainage / throat tickle try take CHLORPHENIRAMINE  4 mg  (Chlortab 4mg   at [facility] should be easiest to find in the green box)  take one every 4 hours as needed      07/25/2018 acute extended ov/Selah Zelman re:  Severe cough off ace x 4 weeks Chief Complaint  Patient presents with   Acute Visit    Increased cough over the past 4 days- non prod.   tickle when lie down only taking  1 h1 at hs (instructions were to take up to 2 ) no better with saba  Sleeping on wedge   Not limited by breathing from desired activities   rec For drainage / throat tickle try take CHLORPHENIRAMINE  4 mg  Increase gabapentin to 300 mg 4 x daily  Increase pepcid to 10 mg x 2 x 1 hours before bedtime  depomedrol 120 mg    08/08/2018  f/u ov/Panagiotis Oelkers re: off ace x 6 weeks  maint on singulair /  Improving cough  Chief Complaint  Patient presents with   Follow-up    Cough has improved some.   Dyspnea:  Limited by back not breath Cough: 95% better, did cough with grass exp/laughing w/in one week of ov  Sleeping: fine on wedge  SABA use: none 02: none  rec No change rx  Return to July 18th, coughing same day working than she does at home    08/29/2018  f/u ov/Joss Friedel re: uacs vs cough variant asthma Chief Complaint  Patient presents with   Acute Visit    Pt states she is  continuing to have dry cough and SOB. She states she can not work at all due to these symptoms.   Dyspnea:  MMRC3 = can't walk 100 yards even at a slow pace at a flat grade s stopping due to sob  /back pain  Cough: harsh honking no mucus Sleeping: on wedge/ recliner worse flat coughing/ gasping SABA use: albterol not helping  02: none  Reports better p depomedrol rx  rec Depomedrol 120 mg IM  For drainage / throat tickle try take CHLORPHENIRAMINE  4 mg  (Chlortab 4mg   at should be easiest to find in the green box)  take one every 4 hours as needed - available over the counter- may cause drowsiness so start with just a bedtime dose or two and see how you tolerate it before trying in daytime   Symbicort 80 Take 2 puffs first thing in am and then another 2 puffs about 12 hours later.  Please schedule a follow up office visit in 2 weeks, sooner if needed  with all medications /inhalers/ solutions in hand so we can verify exactly what you are taking. This includes all medications from all doctors and over the counters    09/17/2018  f/u ov/Kissa Campoy re:  Prior March 2020 no need any resp rx / still tickle in throat / avg tramadol 5 per day  Chief Complaint  Patient presents with   Follow-up    doing ok "as long as I don't move"  Dyspnea:  50 ft even if not coughing  Cough: dry honking sound  Sleeping: wedge x 30 degrees since early March 2020 /  SABA use: albuterol not helping not even neb  02: none  rec Depomedrol 120 mg IM today and stop symbicort Take delsym two tsp every 12 hours and supplement if needed with  Tylenol #3   up to 1-2 every 4 hoursd.    Continue gabapentin 300 mg four times daily     10/11/2018  f/u ov/Marrissa Dai re: cough x March 2020 Chief Complaint  Patient presents with   Follow-up    Cough is much improved- has not coughed in a wk.   Dyspnea:  MMRC1 = can walk nl pace, flat grade, can't hurry or go uphills or steps s sob   Cough: only tylenol #3 x one at  hs s delsym or h1 at hs Sleeping: wedge  SABA use: none  02: none  rec Continuous leave 09/13/2018 until 10/25/18 and should only do isolation rooms once a week  because full PPE makes it hard to breathe.   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 Increase chlorpheniramine to 4 mg x 2 an hour before bed     Virtual Visit via Telephone Note 11/12/2018   I connected with Catherine Kane on 11/12/18 at 11:00 AM EDT by telephone and verified that I am speaking with the correct person using two identifiers.   I discussed the limitations, risks, security and privacy concerns of performing an evaluation and management service by telephone and the availability of in person appointments. I also discussed with the patient that there may be a patient responsible charge related to this service. The patient expressed understanding and agreed to proceed.   History of Present Illness: Dyspnea:  avg pace x [facility]  Cough: none/ no need for Tyl#3  Sleeping: no resp symptoms on wedge, can't get comfortable lying on back x year  SABA use: none  02: none  Still not able to return to work as can't breathe thru PPE rec To get the most out of exercise, you need to be continuously aware that you are short of breath, but never out of breath, for 30 minutes daily. As you improve, it will actually be easier for you to do the same amount of exercise  in  30 minutes so always push to the level where you are short of breath.  in steady weight loss programs. No change in medications  Please schedule a follow up office visit in 6 weeks, call sooner if needed with all medications /inhalers/ solutions in hand so we can verify exactly what you are taking. This includes all medications from all doctors and over the counters     Virtual Visit via Telephone Note 12/24/2018   I connected with Catherine Kane on 12/24/18  at 2 pm  by telephone and verified that I am speaking with the correct person using two identifiers.   I discussed the limitations, risks, security and privacy concerns of performing an evaluation and management service by telephone and the availability of in person appointments. I also discussed with the patient that there may be a patient responsible charge related to this service. The patient expressed understanding and agreed to proceed.   History of Present Illness: All better p last ov then Nov 8th had lunch with coworker that was sick then Nov 11th sore throat cough low and aches 11/13 tested and notified pos  grade fever  11/16 and none since but lost sense of smell, feels like a head cold but no mucus , diarrhea   no ha.  Dyspnea:  none Cough: non- productive  Sleeping: ok  SABA use: none 02: none  rec Prednisone 10 mg take  4 each am x 2 days,   2 each am x 2 days,  1 each am x 2 days and stop  Take delsym two tsp every 12 hours and supplement if needed with  Tylenol #3   up to 1-2 every 4 hours to suppress the urge to cough  Once you have eliminated the cough for 3 straight days try reducing the Tylenol #3 first,  then the delsym as tolerated.  Broth based soups/ noodles crackers  Please schedule a follow up office visit in 2 weeks, sooner if needed  with all medications /inhalers/ solutions in hand so we can verify exactly what you are taking. This includes all medications from all doctors and over the counters   Virtual Visit via Telephone Note 01/01/2019   I connected with Catherine Kane on 01/01/19 at 11:30 AM EST by telephone and verified that I am speaking with the correct person using two identifiers.   I discussed the limitations, risks, security and privacy concerns of performing an evaluation and management service by telephone and the availability of in person appointments. I also discussed with the patient that there may be a patient responsible charge related  to this service. The patient expressed understanding and agreed to proceed.   History of Present Illness: re flare of cough with covid 19 on Nov 11 Took 30 tyl #3 since 8 days ago avg 3-4 per day never controlled the cough  Dyspnea:  Mostly walk / talk makes her cough but if not coughing doe much better  Cough: dry harsh esp worse with talking  Sleeping: great /elevated with wedge  SABA use: none  02: none    No obvious day to day or daytime variability or assoc excess/ purulent sputum or mucus plugs or hemoptysis or cp or chest tightness, subjective wheeze or overt sinus or hb symptoms.    Also denies any obvious fluctuation of symptoms with weather or environmental changes or other aggravating or alleviating factors except as outlined above.   Meds reviewed/ med reconciliation completed         Observations/Objective: Harsh coughing fits while talking/ some hoarseness    Assessment and Plan: See problem list for active a/p's   Follow Up Instructions: See avs for instructions unique to this ov which includes revised/ updated med list     I discussed the assessment and treatment plan with the patient. The patient was provided an opportunity to ask questions and all were answered. The patient agreed with the plan and demonstrated an understanding of the instructions.   The patient was advised to call back or seek an in-person evaluation if the symptoms worsen or if the condition fails to improve as anticipated.  I provided 25  minutes of non-face-to-face time during this encounter.   Sandrea Hughs, MD

## 2019-01-06 ENCOUNTER — Ambulatory Visit: Payer: No Typology Code available for payment source | Admitting: Internal Medicine

## 2019-01-06 MED FILL — HYDROCHLOROTHIAZIDE 25 MG T: 25 | 30 days supply | Qty: 30 | Fill #0

## 2019-01-07 ENCOUNTER — Telehealth: Payer: Self-pay | Admitting: Family Medicine

## 2019-01-07 DIAGNOSIS — M5431 Sciatica, right side: Secondary | ICD-10-CM

## 2019-01-08 MED ORDER — IBUPROFEN 800 MG PO TABS: 800.0000 mg | ORAL_TABLET | Freq: Three times a day (TID) | ORAL | 3 refills | Status: DC | PRN

## 2019-01-08 MED FILL — IBUPROFEN 800 MG TABLET: 800 | 10 days supply | Qty: 30 | Fill #0

## 2019-01-08 NOTE — Telephone Encounter (Signed)
This has been done. Thanks.

## 2019-01-10 ENCOUNTER — Other Ambulatory Visit: Payer: Self-pay

## 2019-01-10 ENCOUNTER — Ambulatory Visit (INDEPENDENT_AMBULATORY_CARE_PROVIDER_SITE_OTHER): Payer: Self-pay | Admitting: Family Medicine

## 2019-01-10 DIAGNOSIS — R059 Cough, unspecified: Secondary | ICD-10-CM

## 2019-01-10 DIAGNOSIS — R05 Cough: Secondary | ICD-10-CM

## 2019-01-10 DIAGNOSIS — R0989 Other specified symptoms and signs involving the circulatory and respiratory systems: Secondary | ICD-10-CM

## 2019-01-10 DIAGNOSIS — M5431 Sciatica, right side: Secondary | ICD-10-CM

## 2019-01-10 DIAGNOSIS — Z09 Encounter for follow-up examination after completed treatment for conditions other than malignant neoplasm: Secondary | ICD-10-CM

## 2019-01-10 DIAGNOSIS — F419 Anxiety disorder, unspecified: Secondary | ICD-10-CM

## 2019-01-10 DIAGNOSIS — I10 Essential (primary) hypertension: Secondary | ICD-10-CM

## 2019-01-10 MED ORDER — AMOXICILLIN-POT CLAVULANATE 875-125 MG PO TABS: 1.0000 | ORAL_TABLET | Freq: Two times a day (BID) | ORAL | 0 refills | Status: AC

## 2019-01-10 MED ORDER — TRAMADOL HCL 50 MG PO TABS: 50.0000 mg | ORAL_TABLET | Freq: Four times a day (QID) | ORAL | 0 refills | Status: DC | PRN

## 2019-01-10 MED FILL — traMADol HCL 50 MG TABS: 50 | 7 days supply | Qty: 30 | Fill #0

## 2019-01-10 MED FILL — AMOX-CLAV 875-125 MG TABLET: 875-125 | 10 days supply | Qty: 20 | Fill #0

## 2019-01-10 NOTE — Progress Notes (Signed)
Virtual Visit via Telephone Note  I connected with Catherine Kane on 01/11/19 at  9:20 AM EST by telephone and verified that I am speaking with the correct person using two identifiers.   I discussed the limitations, risks, security and privacy concerns of performing an evaluation and management service by telephone and the availability of in person appointments. I also discussed with the patient that there may be a patient responsible charge related to this service. The patient expressed understanding and agreed to proceed.   History of Present Illness:  Past Medical History:  Diagnosis Date  . Asthma   . Bronchitis   . Chronic obstructive pulmonary disease (HCC) 05/03/2018  . Hypertension   . Vitamin D deficiency 10/2018    Social History   Tobacco Use  . Smoking status: Never Smoker  . Smokeless tobacco: Never Used  Substance Use Topics  . Alcohol use: No  . Drug use: No    Family History  Problem Relation Age of Onset  . Heart disease Mother   . Heart disease Sister   . Breast cancer Maternal Aunt   . Breast cancer Maternal Grandmother     Current Status: Since her last office visit, she is doing well with no complaints.  She was previously diagnosed with COVID19 on 12/18/2018. She continues to have cough, chest congestion, ear pain, and shortness of breath. She denies fevers, chills, fatigue, recent infections, weight loss, and night sweats. She has not had any headaches, visual changes, dizziness, and falls. No chest pain, heart palpitations, cough and shortness of breath reported. No reports of GI problems such as nausea, vomiting, diarrhea, and constipation. She has no reports of blood in stools, dysuria and hematuria. No depression or anxiety, and denies suicidal ideations, homicidal ideations, or auditory hallucinations. She denies pain today.   Observations/Objective: Telephone Virtual Visit  Assessment and Plan:  1. Chest congestion -  amoxicillin-clavulanate (AUGMENTIN) 875-125 MG tablet; Take 1 tablet by mouth 2 (two) times daily for 10 days.  Dispense: 20 tablet; Refill: 0  2. Cough We will initiate antibiotic today.  - amoxicillin-clavulanate (AUGMENTIN) 875-125 MG tablet; Take 1 tablet by mouth 2 (two) times daily for 10 days.  Dispense: 20 tablet; Refill: 0  3. Essential hypertension She will continue to take medications as prescribed, to decrease high sodium intake, excessive alcohol intake, increase potassium intake, smoking cessation, and increase physical activity of at least 30 minutes of cardio activity daily. She will continue to follow Heart Healthy or DASH diet.  4. Sciatic pain, right - traMADol (ULTRAM) 50 MG tablet; Take 1 tablet (50 mg total) by mouth every 6 (six) hours as needed.  Dispense: 30 tablet; Refill: 0  5. Anxiety Stable.   Meds ordered this encounter  Medications  . amoxicillin-clavulanate (AUGMENTIN) 875-125 MG tablet    Sig: Take 1 tablet by mouth 2 (two) times daily for 10 days.    Dispense:  20 tablet    Refill:  0    Order Specific Question:   Supervising Provider    Answer:   Quentin Angst [id number]  . traMADol (ULTRAM) 50 MG tablet    Sig: Take 1 tablet (50 mg total) by mouth every 6 (six) hours as needed.    Dispense:  30 tablet    Refill:  0   No orders of the defined types were placed in this encounter.   Referral Orders  No referral(s) requested today   Raliegh Ip,  MSN, FNP-BC [facility] [address] [place], [place] [address] [phone] fax    Follow Up Instructions: She will follow up in 1 month.    I discussed the assessment and treatment plan with the patient. The patient was provided an opportunity to ask questions and all were answered. The patient agreed with the plan and demonstrated an understanding of the instructions.   The patient was advised to call  back or seek an in-person evaluation if the symptoms worsen or if the condition fails to improve as anticipated.  I provided 20 minutes of non-face-to-face time during this encounter.   Azzie Glatter, FNP

## 2019-01-11 DIAGNOSIS — F419 Anxiety disorder, unspecified: Secondary | ICD-10-CM | POA: Insufficient documentation

## 2019-01-14 ENCOUNTER — Encounter: Payer: Self-pay | Admitting: Family Medicine

## 2019-01-20 ENCOUNTER — Telehealth: Payer: Self-pay | Admitting: Internal Medicine

## 2019-01-20 NOTE — Telephone Encounter (Signed)
Ok to schedule/ bring all active meds to ov

## 2019-01-20 NOTE — Telephone Encounter (Signed)
Called and spoke to patient.   Patient was in [place] a bike for a family member for Christmas. Patient stated that she tested positive for COVID on 12/20/2018. Patient would like to make an OV with Dr. Melvyn Novas because she continues to have symptoms. Patient stated that she has an ongoing cough and still has shortness of breath. Patient stated she can't work and is not able to sleep well due to symptoms. Dr. Melvyn Novas please advise. Thank you!

## 2019-01-20 NOTE — Telephone Encounter (Signed)
Rec'd completed forms back - fwd to [employer] via interoffice mail -pr  °

## 2019-01-20 NOTE — Telephone Encounter (Signed)
Scheduled patient for office visit per Dr. Melvyn Novas. Instructed patient to bring all of her medications with her for appointment.  Patient verbalized understanding. Nothing further needed at this time.

## 2019-01-21 ENCOUNTER — Telehealth: Payer: Self-pay | Admitting: Family Medicine

## 2019-01-22 ENCOUNTER — Ambulatory Visit (INDEPENDENT_AMBULATORY_CARE_PROVIDER_SITE_OTHER): Payer: Self-pay | Admitting: Internal Medicine

## 2019-01-22 ENCOUNTER — Ambulatory Visit (INDEPENDENT_AMBULATORY_CARE_PROVIDER_SITE_OTHER): Payer: No Typology Code available for payment source

## 2019-01-22 ENCOUNTER — Encounter: Payer: Self-pay | Admitting: Internal Medicine

## 2019-01-22 ENCOUNTER — Other Ambulatory Visit: Payer: Self-pay

## 2019-01-22 DIAGNOSIS — R059 Cough, unspecified: Secondary | ICD-10-CM

## 2019-01-22 DIAGNOSIS — R05 Cough: Secondary | ICD-10-CM

## 2019-01-22 MED ORDER — FLUTTER DEVI: 0 refills | Status: DC

## 2019-01-22 MED ORDER — PREDNISONE 10 MG PO TABS: ORAL_TABLET | ORAL | 0 refills | Status: DC

## 2019-01-22 MED ORDER — ACETAMINOPHEN-CODEINE #4 300-60 MG PO TABS: ORAL_TABLET | ORAL | 0 refills | Status: DC

## 2019-01-22 MED FILL — ACETAMINOPHEN/COD #4 TABLET: 300-60 | 3 days supply | Qty: 40 | Fill #0

## 2019-01-22 MED FILL — predniSONE 10 MG TABS: 10 | 6 days supply | Qty: 14 | Fill #0

## 2019-01-22 NOTE — Progress Notes (Signed)
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 since when?  and around 2005 noted onset cough dx of asthma in [place] with season change typically exac with uri  And in no need for any maint rx other protonix but then while on ppi April 25 2018 severe cough /sob refractory to saba hfa/neb/singulair/flonase/ zyrtec/no prednisone > put out of work since March 19th and not worked since and 4 ov's with HCP's  So self referred to pulmonary 06/25/2018       History of Present Illness  06/25/2018  Pulmonary/ 1st office eval/Catherine Kane  Chief Complaint  Patient presents with  . Pulmonary Consult    Self referral. Pt c/o cough and increased SOB for the past several wks. Cough is non prod.   Dyspnea:  Mailbox and back prior to March 19 no problem, flat 50 ft ok now struggles /makes her cough Cough: dry/ day > noct more with movement , sitting fine, sleeping fine / speaking makes it worse / so does laughing/ blowing up balloon SABA use: 2 hfa's 1 neb no better / pred always helps some for a few days at leas rec Stop lisinopril and start micardis 40 mg one daily  Prednisone 10 mg take  4 each am x 2 days,   2 each am x 2 days,  1 each am x 2 days and stop  Change protonix to 40 mg Take 30-60 min before first meal of the day and pepcid 20 mg after supper until you return  Best cough syrup is delsym 2tsp every 12 hours as needed  GERD  Only use your albuterol as a rescue medication Please schedule a follow up office visit in 6 weeks, call sooner if needed with all medications /inhalers/ solutions in hand so we can verify exactly what you are taking. This includes all medications from all doctors and over the counters    07/10/2018  f/u ov/Catherine Kane re: severe cough not  resp to gabapentin 300 tid and delsym  Chief Complaint  Patient presents with  . Follow-up    Patient states that her dry cough is about the same since last visit. She states that she uses her resue inhaler twice daily.  Dyspnea:  Mostly sob when coughing Cough: dry honking 24/7  Sleeping: on side wedge pillow  SABA use: albuterol not helping rec No work for 2 weeks  Depomedrol 120 mg IM today  Take delsym two tsp every 12 hours and supplement if needed with  tramadol 50 mg up to 2 every 4 hours to suppress the urge to cough. Once you have eliminated the cough for 3 straight days try reducing the tramadol first,  then the delsym as tolerated.   For drainage / throat tickle try take CHLORPHENIRAMINE  4 mg  (Chlortab 4mg   at [employer] should be easiest to find in the green box)  take one every 4 hours as needed      07/25/2018 acute extended ov/Catherine Kane re:  Severe cough off ace x 4 weeks Chief Complaint  Patient presents with  . Acute Visit    Increased cough over the past 4 days- non prod.   tickle when lie down  only taking 1 h1 at hs (instructions were to take up to 2 ) no better with saba  Sleeping on wedge   Not limited by breathing from desired activities   rec For drainage / throat tickle try take CHLORPHENIRAMINE  4 mg  Increase gabapentin to 300 mg 4 x daily  Increase pepcid to 10 mg x 2 x 1 hours before bedtime  depomedrol 120 mg    08/08/2018  f/u ov/Catherine Kane re: off ace x 6 weeks  maint on singulair /  Improving cough  Chief Complaint  Patient presents with  . Follow-up    Cough has improved some.   Dyspnea:  Limited by back not breath Cough: 95% better, did cough with grass exp/laughing w/in one week of ov  Sleeping: fine on wedge  SABA use: none 02: none  rec No change rx  Return to July 18th, coughing same day working than she does at home    08/29/2018  f/u ov/Catherine Kane re: uacs vs cough variant asthma Chief Complaint  Patient presents with  . Acute Visit     Pt states she is continuing to have dry cough and SOB. She states she can not work at all due to these symptoms.   Dyspnea:  MMRC3 = can't walk 100 yards even at a slow pace at a flat grade s stopping due to sob  /back pain  Cough: harsh honking no mucus Sleeping: on wedge/ recliner worse flat coughing/ gasping SABA use: albterol not helping  02: none  Reports better p depomedrol rx  rec Depomedrol 120 mg IM  For drainage / throat tickle try take CHLORPHENIRAMINE  4 mg  (Chlortab 4mg   at should be easiest to find in the green box)  take one every 4 hours as needed - available over the counter- may cause drowsiness so start with just a bedtime dose or two and see how you tolerate it before trying in daytime   Symbicort 80 Take 2 puffs first thing in am and then another 2 puffs about 12 hours later.  Please schedule a follow up office visit in 2 weeks, sooner if needed  with all medications /inhalers/ solutions in hand so we can verify exactly what you are taking. This includes all medications from all doctors and over the counters    09/17/2018  f/u ov/Catherine Kane re:  Prior March 2020 no need any resp rx / still tickle in throat / avg tramadol 5 per day  Chief Complaint  Patient presents with  . Follow-up    doing ok "as long as I don't move"  Dyspnea:  50 ft even if not coughing  Cough: dry honking sound  Sleeping: wedge x 30 degrees since early March 2020 /  SABA use: albuterol not helping not even neb  02: none  rec Depomedrol 120 mg IM today and stop symbicort Take delsym two tsp every 12 hours and supplement if needed with  Tylenol #3   up to 1-2 every 4 hoursd.    Continue gabapentin 300 mg four times daily     10/11/2018  f/u ov/Catherine Kane re: cough x March 2020 Chief Complaint  Patient presents with  . Follow-up    Cough is much improved- has not coughed in a wk.    Dyspnea:  MMRC1 = can walk nl pace, flat grade, can't hurry or go uphills or steps s sob   Cough: only  tylenol #3 x one at hs s delsym or h1 at hs  Sleeping: wedge  rec Increase chlorpheniramine to 4 mg x 2 an hour before bed   12/24/18 televisit recs   covid symptom onset 12/18/18  Prednisone 10 mg take  4 each am x 2 days,   2 each am x 2 days,  1 each am x 2 days and stop  Take delsym two tsp every 12 hours and supplement if needed with  Tylenol #3   up to 1-2 every 4 hours to suppress the urge to cough. Once you have eliminated the cough for 3 straight days try reducing the Tylenol #3 first,  then the delsym as tolerated.  Broth based soups/ noodles crackers   01/01/2019 televisit recs Take delsym two tsp every 12 hours and supplement if needed with  Tylenol #4 up to 1 every 4 hours > did not obtain as not avail at [facility]   Once you have eliminated the cough for 3 straight days try reducing the Tylenol #4 first,  then the delsym as tolerated.   Prednisone 10 mg take  4 each am x 2 days,   2 each am x 2 days,  1 each am x 2 days and stop      01/22/2019  f/u ov/Alisen Marsiglia re: cough relapsed p covid dx Nov 11 unable to work housekeeping for [employer]  Patient presents with  . Acute Visit    increased cough since dx with covid 19 12/20/18.  Dyspnea:  Only when coughing  Cough: 24/7 honking with gen ant chest discomfort with coughing/ no better on tyl #3  Sleeping 45 degrees  SABA use: not helping  02: none    No obvious day to day or daytime variability or assoc excess/ purulent sputum or mucus plugs or hemoptysis or cp or chest tightness, subjective wheeze or overt sinus or hb symptoms.    . Also denies any obvious fluctuation of symptoms with weather or environmental changes or other aggravating or alleviating factors except as outlined above   No unusual exposure hx or h/o childhood pna/ asthma or knowledge of premature birth.  Current Allergies, Complete Past Medical History, Past Surgical History, Family History, and Social History were reviewed in  [facility] record.  ROS  The following are not active complaints unless bolded Hoarseness, sore throat, dysphagia, dental problems, itching, sneezing,  nasal congestion or discharge of excess mucus or purulent secretions, ear ache,   fever, chills, sweats, unintended wt loss or wt gain, classically pleuritic or exertional cp,  orthopnea pnd or arm/hand swelling  or leg swelling, presyncope, palpitations, abdominal pain, anorexia, nausea, vomiting, diarrhea  or change in bowel habits or change in bladder habits, change in stools or change in urine, dysuria, hematuria,  rash, arthralgias, visual complaints, headache, numbness, weakness or ataxia or problems with walking or coordination,  change in mood or  memory.        Current Meds  Medication Sig  . acetaminophen-codeine (TYLENOL #4) 300-60 MG tablet Take 1 tablet by mouth every 4 (four) hours as needed for moderate pain.  Marland Kitchen amLODipine (NORVASC) 10 MG tablet Take 1 tablet (10 mg total) by mouth daily.  Marland Kitchen aspirin EC 81 MG tablet Take 81 mg by mouth daily.  . cetirizine (ZYRTEC) 10 MG tablet Take 10 mg by mouth daily.  . chlorpheniramine (CHLOR-TRIMETON) 4 MG tablet Take 4 mg by mouth every 4 (four) hours as needed for allergies.  . Dextromethorphan-Menthol (DELSYM COUGH RELIEF MT) Use as directed in the mouth  or throat as needed.  . famotidine (PEPCID) 20 MG tablet One after supper  . Ferrous Sulfate (IRON) 325 (65 Fe) MG TABS Take 1 tablet by mouth daily.  Marland Kitchen. FLUoxetine (PROZAC) 20 MG tablet Take 1 tablet (20 mg total) by mouth daily.  Marland Kitchen. gabapentin (NEURONTIN) 300 MG capsule Take 2 capsules (600 mg) 3 times a day.  . hydrochlorothiazide (HYDRODIURIL) 25 MG tablet Take 1 tablet (25 mg total) by mouth daily.  Marland Kitchen. ibuprofen (ADVIL) 800 MG tablet Take 1 tablet (800 mg total) by mouth every 8 (eight) hours as needed for mild pain or moderate pain.  . methocarbamol (ROBAXIN) 500 MG tablet Take 1 tablet (500 mg total) by mouth 2  (two) times daily as needed for muscle spasms.  . pantoprazole (PROTONIX) 40 MG tablet TAKE 1 TABLET (40 MG TOTAL) BY MOUTH DAILY. TAKE 30-60 MIN BEFORE FIRST MEAL OF THE DAY  . telmisartan (MICARDIS) 40 MG tablet Take 1 tablet (40 mg total) by mouth daily.  . traMADol (ULTRAM) 50 MG tablet Take 1 tablet (50 mg total) by mouth every 6 (six) hours as needed.  . valACYclovir (VALTREX) 500 MG tablet Take 500 mg by mouth 2 (two) times daily.  . Vitamin D, Ergocalciferol, (DRISDOL) 1.25 MG (50000 UT) CAPS capsule Take 1 capsule (50,000 Units total) by mouth every 7 (seven) days.                    Objective:    amb obese bf with honking dry cough on insp maneuver   01/22/2019      218  10/11/2018          225  09/17/2018        215  08/29/2018        214  08/08/2018          213  07/25/2018        215   07/10/18 210 lb (95.3 kg)  06/25/18 208 lb 9.6 oz (94.6 kg)  05/31/18 200 lb (90.7 kg)     Vital signs reviewed - Note on arrival 02 sats  97% on RA   HEENT : pt wearing mask not removed for exam due to covid -19 concerns.    NECK :  without JVD/Nodes/TM/ nl carotid upstrokes bilaterally   LUNGS: no acc muscle use,  Nl contour chest which is clear to A and P bilaterally without cough on insp or exp maneuvers   CV:  RRR  no s3 or murmur or increase in P2, and no edema   ABD: obese/ soft and nontender with nl inspiratory excursion in the supine position. No bruits or organomegaly appreciated, bowel sounds nl  MS:  Nl gait/ ext warm without deformities, calf tenderness, cyanosis or clubbing No obvious joint restrictions   SKIN: warm and dry without lesions    NEURO:  alert, approp, nl sensorium with  no motor or cerebellar deficits apparent.       CXR PA and Lateral:   01/22/2019 :    I personally reviewed images and   impression as follows:   wnl           Assessment

## 2019-01-22 NOTE — Patient Instructions (Signed)
Prednisone 10 mg take  4 each am x 2 days,   2 each am x 2 days,  1 each am x 2 days and stop  Take delsym two tsp every 12 hours and supplement if needed with  Tylenol #4   up to 1-2 every 4 hours to suppress the urge to cough. Swallowing water and/or using ice chips/non mint and menthol containing candies (such as lifesavers or sugarless jolly ranchers) are also effective.  You should rest your voice and avoid activities that you know make you cough.  Once you have eliminated the cough for 3 straight days try reducing the Tylenol #4 first,  then the delsym as tolerated.      Please remember to go to the  x-ray department  for your tests - we will call you with the results when they are available     Continue singulair and stop zyrtec and  For drainage / throat tickle try take CHLORPHENIRAMINE  4 mg  (Chlortab 4mg   at [employer] [facility] should be easiest to find in the green box)  take 2every 4 hours as needed   Cough into flutter valve   Please schedule a follow up office visit in 1 weeks, call sooner if needed with all medications /inhalers/ solutions in hand so we can verify exactly what you are taking. This includes all medications from all doctors and over the [place] separate them into two bags:  the ones you take automatically, no matter what, vs the ones you take just when you feel you need them "BAG #2 is UP TO YOU"  - this will really help [place] help you take your medications more effectively.

## 2019-01-22 NOTE — Assessment & Plan Note (Addendum)
Onset cough March 2020 - d/c acei 06/25/2018 and max rx for gerd  - cyclical cough rx 03/10/252  - Allergy profile 07/25/2018 >  Eos 0.3 /  IgE 52 with RAST pos ragweed, grass, trees dust  - 08/29/2018  After extensive coaching inhaler device,  effectiveness =    75% try symb 80 2bid x 2 weeks > no improvement in cough or doe so d/c 09/17/2018 > cough 95% resolved 10/11/2018 s codeine x at hs  - 11/12/2018 cough remains resolved, still on gabapentin but no cough meds/ no inhalers  - recurred 12/18/18 with subseq dx of COVID 19 rx : 12/24/2018  pred x 6 days, clear liquids, cough sup with Tyl #3  - repeat cyclical cough rx 27/07/2374 with tyl #4 / pred x 6 days > did not do  - repeat with tyl #4/ restart singulair/ max h1/ flutter valve and return in one week  Of the three most common causes of  Sub-acute / recurrent or chronic cough, only one (GERD)  can actually contribute to/ trigger  the other two (asthma and post nasal drip syndrome)  and perpetuate the cylce of cough.  While not intuitively obvious, many patients with chronic low grade reflux do not cough until there is a primary insult that disturbs the protective epithelial barrier and exposes sensitive nerve endings.   This is typically viral but can due to PNDS and  either may apply here.   The point is that once this occurs, it is difficult to eliminate the cycle  using anything but a maximally effective acid suppression regimen at least in the short run, accompanied by an appropriate diet to address non acid GERD and control / eliminate the cough itself for at least 3 days with tyl #3 / 1st gen H1 blockers per guidelines  And also added 6 days of Prednisone in case of component of Th-2 driven upper or lower airways inflammation (if cough responds short term only to relapse befor return while will on rx for uacs that would point to allergic rhinitis/ asthma or eos bronchitis)    rec return in one week with all meds in hand using a trust but verify  approach to confirm accurate Medication  Reconciliation The principal here is that until we are certain that the  patients are doing what we've asked, it makes no sense to ask them to do more.  To keep things simple, I have asked the patient to first separate medicines that are perceived as maintenance, that is to be taken daily "no matter what", from those medicines that are taken on only on an as-needed basis and I have given the patient examples of both, and then return to see me with all active meds which she only partially did today.  I had an extended discussion with the patient reviewing all relevant studies completed to date and  lasting 15 to 20 minutes of a 25 minute visit    Each maintenance medication was reviewed in detail including most importantly the difference between maintenance and prns and under what circumstances the prns are to be triggered using an action plan format that is not reflected in the computer generated alphabetically organized AVS.     Please see AVS for specific instructions unique to this visit that I personally wrote and verbalized to the the pt in detail and then reviewed with pt  by my nurse highlighting any  changes in therapy recommended at today's visit to their plan of care.

## 2019-01-23 ENCOUNTER — Telehealth: Payer: Self-pay | Admitting: Internal Medicine

## 2019-01-23 MED FILL — ?ERGOCALCIFEROL 50000 UNITC: 1.25 MG | 84 days supply | Qty: 12 | Fill #1

## 2019-01-23 MED FILL — MONTELUKAST SOD 10 MG TAB: 10 | 30 days supply | Qty: 30 | Fill #1

## 2019-01-23 MED FILL — PANTOPRAZOLE SOD DR 40 MG T: 40 | 30 days supply | Qty: 30 | Fill #1

## 2019-01-23 NOTE — Telephone Encounter (Signed)
Tanda Rockers, MD  01/22/2019 8:02 PM EST    Call pt: Reviewed cxr and no acute changes - surprisingly little change post covid 19 which is a good sign > no change in recommendations made at [facility] with pt, aware of results/recs.  Nothing further needed.

## 2019-01-24 NOTE — Telephone Encounter (Signed)
01/24/19 Pt called today asked if she paperwork can be done quickly

## 2019-01-26 ENCOUNTER — Encounter: Payer: Self-pay | Admitting: Family Medicine

## 2019-01-29 ENCOUNTER — Other Ambulatory Visit: Payer: Self-pay

## 2019-01-29 ENCOUNTER — Ambulatory Visit (INDEPENDENT_AMBULATORY_CARE_PROVIDER_SITE_OTHER): Payer: Self-pay | Admitting: Internal Medicine

## 2019-01-29 ENCOUNTER — Encounter: Payer: Self-pay | Admitting: Internal Medicine

## 2019-01-29 DIAGNOSIS — R05 Cough: Secondary | ICD-10-CM

## 2019-01-29 DIAGNOSIS — R059 Cough, unspecified: Secondary | ICD-10-CM

## 2019-01-29 MED ORDER — FAMOTIDINE 20 MG PO TABS: ORAL_TABLET | ORAL | 11 refills | Status: DC

## 2019-01-29 NOTE — Patient Instructions (Signed)
Pepcid 20 mg after supper or at  bedtime x 2 weeks then add back for first sign of cough   Bed blocks x 6-8 inches under head of bed    If you are satisfied with your treatment plan,  let your doctor know and he/she can either refill your medications or you can return here when your prescription runs out.     If in any way you are not 100% satisfied,  please tell us.  If 100% better, tell your friends!  Pulmonary follow up is as needed

## 2019-01-29 NOTE — Progress Notes (Signed)
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 since when?  and around 2005 noted onset cough dx of asthma in [place] with season change typically exac with uri  And in no need for any maint rx other protonix but then while on ppi April 25 2018 severe cough /sob refractory to saba hfa/neb/singulair/flonase/ zyrtec/no prednisone > put out of work since March 19th and not worked since and 4 ov's with HCP's  So self referred to pulmonary 06/25/2018       History of Present Illness  06/25/2018  Pulmonary/ 1st office eval/Chung Chagoya  Chief Complaint  Patient presents with  . Pulmonary Consult    Self referral. Pt c/o cough and increased SOB for the past several wks. Cough is non prod.   Dyspnea:  Mailbox and back prior to March 19 no problem, flat 50 ft ok now struggles /makes her cough Cough: dry/ day > noct more with movement , sitting fine, sleeping fine / speaking makes it worse / so does laughing/ blowing up balloon SABA use: 2 hfa's 1 neb no better / pred always helps some for a few days at leas rec Stop lisinopril and start micardis 40 mg one daily  Prednisone 10 mg take  4 each am x 2 days,   2 each am x 2 days,  1 each am x 2 days and stop  Change protonix to 40 mg Take 30-60 min before first meal of the day and pepcid 20 mg after supper until you return  Best cough syrup is delsym 2tsp every 12 hours as needed  GERD  Only use your albuterol as a rescue medication Please schedule a follow up office visit in 6 weeks, call sooner if needed with all medications /inhalers/ solutions in hand so we can verify exactly what you are taking. This includes all medications from all doctors and over the counters    07/10/2018  f/u ov/Landis Dowdy re: severe cough not  resp to gabapentin 300 tid and delsym  Chief Complaint  Patient presents with  . Follow-up    Patient states that her dry cough is about the same since last visit. She states that she uses her resue inhaler twice daily.  Dyspnea:  Mostly sob when coughing Cough: dry honking 24/7  Sleeping: on side wedge pillow  SABA use: albuterol not helping rec No work for 2 weeks  Depomedrol 120 mg IM today  Take delsym two tsp every 12 hours and supplement if needed with  tramadol 50 mg up to 2 every 4 hours to suppress the urge to cough. Once you have eliminated the cough for 3 straight days try reducing the tramadol first,  then the delsym as tolerated.   For drainage / throat tickle try take CHLORPHENIRAMINE  4 mg  (Chlortab 4mg   at [employer] should be easiest to find in the green box)  take one every 4 hours as needed      07/25/2018 acute extended ov/Jalisa Sacco re:  Severe cough off ace x 4 weeks Chief Complaint  Patient presents with  . Acute Visit    Increased cough over the past 4 days- non prod.   tickle when lie down  only taking 1 h1 at hs (instructions were to take up to 2 ) no better with saba  Sleeping on wedge   Not limited by breathing from desired activities   rec For drainage / throat tickle try take CHLORPHENIRAMINE  4 mg  Increase gabapentin to 300 mg 4 x daily  Increase pepcid to 10 mg x 2 x 1 hours before bedtime  depomedrol 120 mg    08/08/2018  f/u ov/Avir Deruiter re: off ace x 6 weeks  maint on singulair /  Improving cough  Chief Complaint  Patient presents with  . Follow-up    Cough has improved some.   Dyspnea:  Limited by back not breath Cough: 95% better, did cough with grass exp/laughing w/in one week of ov  Sleeping: fine on wedge  SABA use: none 02: none  rec No change rx  Return to July 18th, coughing same day working than she does at home    08/29/2018  f/u ov/Lawrencia Mauney re: uacs vs cough variant asthma Chief Complaint  Patient presents with  . Acute Visit     Pt states she is continuing to have dry cough and SOB. She states she can not work at all due to these symptoms.   Dyspnea:  MMRC3 = can't walk 100 yards even at a slow pace at a flat grade s stopping due to sob  /back pain  Cough: harsh honking no mucus Sleeping: on wedge/ recliner worse flat coughing/ gasping SABA use: albterol not helping  02: none  Reports better p depomedrol rx  rec Depomedrol 120 mg IM  For drainage / throat tickle try take CHLORPHENIRAMINE  4 mg  (Chlortab 4mg   at should be easiest to find in the green box)  take one every 4 hours as needed - available over the counter- may cause drowsiness so start with just a bedtime dose or two and see how you tolerate it before trying in daytime   Symbicort 80 Take 2 puffs first thing in am and then another 2 puffs about 12 hours later.  Please schedule a follow up office visit in 2 weeks, sooner if needed  with all medications /inhalers/ solutions in hand so we can verify exactly what you are taking. This includes all medications from all doctors and over the counters    09/17/2018  f/u ov/Alphonza Tramell re:  Prior March 2020 no need any resp rx / still tickle in throat / avg tramadol 5 per day  Chief Complaint  Patient presents with  . Follow-up    doing ok "as long as I don't move"  Dyspnea:  50 ft even if not coughing  Cough: dry honking sound  Sleeping: wedge x 30 degrees since early March 2020 /  SABA use: albuterol not helping not even neb  02: none  rec Depomedrol 120 mg IM today and stop symbicort Take delsym two tsp every 12 hours and supplement if needed with  Tylenol #3   up to 1-2 every 4 hoursd.    Continue gabapentin 300 mg four times daily     10/11/2018  f/u ov/Maleni Seyer re: cough x March 2020 Chief Complaint  Patient presents with  . Follow-up    Cough is much improved- has not coughed in a wk.    Dyspnea:  MMRC1 = can walk nl pace, flat grade, can't hurry or go uphills or steps s sob   Cough: only  tylenol #3 x one at hs s delsym or h1 at hs  Sleeping: wedge  rec Increase chlorpheniramine to 4 mg x 2 an hour before bed   12/24/18 televisit recs   covid symptom onset 12/18/18  Prednisone 10 mg take  4 each am x 2 days,   2 each am x 2 days,  1 each am x 2 days and stop  Take delsym two tsp every 12 hours and supplement if needed with  Tylenol #3   up to 1-2 every 4 hours to suppress the urge to cough. Once you have eliminated the cough for 3 straight days try reducing the Tylenol #3 first,  then the delsym as tolerated.  Broth based soups/ noodles crackers   01/01/2019 televisit recs Take delsym two tsp every 12 hours and supplement if needed with  Tylenol #4 up to 1 every 4 hours > did not obtain as not avail [facility]   Once you have eliminated the cough for 3 straight days try reducing the Tylenol #4 first,  then the delsym as tolerated.   Prednisone 10 mg take  4 each am x 2 days,   2 each am x 2 days,  1 each am x 2 days and stop      01/22/2019  f/u ov/Raylinn Kosar re: cough relapsed p covid dx Nov 11 unable to work housekeeping for [employer]  Patient presents with  . Acute Visit    increased cough since dx with covid 19 12/20/18.  Dyspnea:  Only when coughing  Cough: 24/7 honking with gen ant chest discomfort with coughing/ no better on tyl #3  Sleeping 45 degrees  SABA use: not helping  02: none rec Prednisone 10 mg take  4 each am x 2 days,   2 each am x 2 days,  1 each am x 2 days and stop Take delsym two tsp every 12 hours and supplement if needed with  Tylenol #4   up to 1-2 every 4 hours to suppress the urge to cough.  Once you have eliminated the cough for 3 straight days try reducing the Tylenol #4 first,  then the delsym as tolerated.    Continue singulair and stop zyrtec and  For drainage / throat tickle try take CHLORPHENIRAMINE  4 mg  (Chlortab 4mg   at [employer] should be easiest to find in the green box)  take 2every 4 hours as  needed  Cough into flutter valve    01/29/2019  f/u ov/Gal Feldhaus re: recurrent cyclical cough N0272 / maint on ppi and singulair / brought all meds but pepcid Chief Complaint  Patient presents with  . Follow-up    Cough last week, better, SOB with activity  Dyspnea:  Across parking lot / up steps ok  Cough: none and off  cough meds x 2 d prior to Mylo: able to lie flat  - has 4 bed blocks instead of 2 rec under HOB SABA use: none  02: none    No obvious day to day or daytime variability or assoc excess/ purulent sputum or mucus plugs or hemoptysis or cp or chest tightness, subjective wheeze or overt sinus or hb symptoms.   Sleeping ok  without nocturnal  or early am exacerbation  of respiratory  c/o's or need for noct saba. Also denies any obvious fluctuation of symptoms with weather or environmental changes or other aggravating or alleviating factors except as outlined above   No unusual exposure hx or h/o childhood pna/ asthma or knowledge of premature birth.  Current Allergies, Complete Past Medical History, Past Surgical History, Family History, and Social History were reviewed in [facility] electronic medical record.  ROS  The following are not active complaints unless bolded Hoarseness, sore throat, dysphagia, dental problems, itching, sneezing,  nasal congestion or discharge of excess mucus or purulent secretions, ear ache,   fever, chills, sweats, unintended wt loss or wt gain, classically pleuritic or exertional cp,  orthopnea pnd or arm/hand swelling  or leg swelling, presyncope, palpitations, abdominal pain, anorexia, nausea, vomiting, diarrhea  or change in bowel habits or change in bladder habits, change in stools or change in urine, dysuria, hematuria,  rash, arthralgias, visual complaints, headache, numbness, weakness or ataxia or problems with walking or coordination,  change in mood or  memory.        Current Meds  Medication Sig  . acetaminophen-codeine  (TYLENOL #4) 300-60 MG tablet 1-2 every 4 hours as needed for cough  . amLODipine (NORVASC) 10 MG tablet Take 1 tablet (10 mg total) by mouth daily.  Marland Kitchen. aspirin EC 81 MG tablet Take 81 mg by mouth daily.  Marland Kitchen. Dextromethorphan-Menthol (DELSYM COUGH RELIEF MT) Use as directed in the mouth or throat as needed.  . famotidine (PEPCID) 20 MG tablet One after supper  . Ferrous Sulfate (IRON) 325 (65 Fe) MG TABS Take 1 tablet by mouth daily.  Marland Kitchen. FLUoxetine (PROZAC) 20 MG tablet Take 1 tablet (20 mg total) by mouth daily.  Marland Kitchen. gabapentin (NEURONTIN) 300 MG capsule Take 2 capsules (600 mg) 3 times a day.  . hydrochlorothiazide (HYDRODIURIL) 25 MG tablet Take 1 tablet (25 mg total) by mouth daily.  Marland Kitchen. ibuprofen (ADVIL) 800 MG tablet Take 1 tablet (800 mg total) by mouth every 8 (eight) hours as needed for mild pain or moderate pain.  . methocarbamol (ROBAXIN) 500 MG tablet Take 1 tablet (500 mg total) by mouth 2 (two) times daily as needed for muscle spasms.  . montelukast (SINGULAIR) 10 MG tablet Take 1 tablet (10 mg total) by mouth at bedtime.  . pantoprazole (PROTONIX) 40 MG tablet TAKE 1 TABLET (40 MG TOTAL) BY MOUTH DAILY. TAKE 30-60 MIN BEFORE FIRST MEAL OF THE DAY  . Respiratory Therapy Supplies (FLUTTER) DEVI Use as directed  . telmisartan (MICARDIS) 40 MG tablet Take 1 tablet (40 mg total) by mouth daily.  . valACYclovir (VALTREX) 500 MG tablet Take 500 mg by mouth 2 (two) times daily.  . Vitamin D, Ergocalciferol, (DRISDOL) 1.25 MG (50000 UT) CAPS capsule Take 1 capsule (50,000 Units total) by mouth every 7 (seven) days.  . [DISCONTINUED] predniSONE (DELTASONE) 10 MG tablet Take  4 each am x 2 days,   2 each am x 2 days,  1 each am x 2 days and stop                            Objective:     01/29/2019      218 01/22/2019      218  10/11/2018          225  09/17/2018        215  08/29/2018        214  08/08/2018          213  07/25/2018        215   07/10/18 210 lb (95.3 kg)  06/25/18  208 lb 9.6 oz (94.6 kg)  05/31/18 200 lb (90.7 kg)    Vital signs reviewed -  Note on arrival 02 sats  98% on RA   Obese pleasant bf nad   HEENT : pt wearing mask not removed for exam due to covid -19 concerns.    NECK :  without JVD/Nodes/TM/ nl carotid upstrokes bilaterally   LUNGS: no acc muscle use,  Nl contour chest which is clear to A and P bilaterally without cough on insp or exp maneuvers   CV:  RRR  no s3 or murmur or increase in P2, and no edema   ABD:  soft and nontender with nl inspiratory excursion in the supine position. No bruits or organomegaly appreciated, bowel sounds nl  MS:  Nl gait/ ext warm without deformities, calf tenderness, cyanosis or clubbing No obvious joint restrictions   SKIN: warm and dry without lesions    NEURO:  alert, approp, nl sensorium with  no motor or cerebellar deficits apparent.              Assessment

## 2019-01-29 NOTE — Assessment & Plan Note (Signed)
Onset cough March 2020 with patter of recurrence x 2010  - d/c acei 06/25/2018 and max rx for gerd  - cyclical cough rx 03/12/2681  - Allergy profile 07/25/2018 >  Eos 0.3 /  IgE 52 with RAST pos ragweed, grass, trees dust  - 08/29/2018  After extensive coaching inhaler device,  effectiveness =    75% try symb 80 2bid x 2 weeks > no improvement in cough or doe so d/c 09/17/2018 > cough 95% resolved 10/11/2018 s codeine x at hs  - 11/12/2018 cough remains resolved, still on gabapentin but no cough meds/ no inhalers  - recurred 12/18/18 with subseq dx of COVID 19 rx : 12/24/2018  pred x 6 days, clear liquids, cough sup with Tyl #3  - repeat cyclical cough rx 41/96/2229 with tyl #4 / pred x 6 days > did not do - repeat with tyl #4/ restart singulair/ max h1/ flutter valve and return in one week > resolved off cough meds 01/29/2019   C/w recurrent cyclical cough from Upper airway cough syndrome (previously labeled PNDS),  is so named because it's frequently impossible to sort out how much is  CR/sinusitis with freq throat clearing (which can be related to primary GERD)   vs  causing  secondary (" extra esophageal")  GERD from wide swings in gastric pressure that occur with throat clearing, often  promoting self use of mint and menthol lozenges that reduce the lower esophageal sphincter tone and exacerbate the problem further in a cyclical fashion.   These are the same pts (now being labeled as having "irritable larynx syndrome" by some cough centers) who not infrequently have a history of having failed to tolerate ace inhibitors,  dry powder inhalers or biphosphonates or report having atypical/extraesophageal reflux symptoms that don't respond to standard doses of PPI  and are easily confused as having aecopd or asthma flares by even experienced allergists/ pulmonologists (myself included).    rec  maint on ppi qam and add h2 hs until no cough x 2 weeks Flutter valve prn Prn 1st gen H1 blockers per  guidelines No need for maint pulmonary rx other than singulair    rtw Jan 1 as planned  Pulmonary f/u prn    I had an extended discussion with the patient reviewing all relevant studies completed to date and  lasting 15 to 20 minutes of a 25 minute final summary office visit    Each maintenance medication was reviewed in detail including most importantly the difference between maintenance and prns and under what circumstances the prns are to be triggered using an action plan format that is not reflected in the computer generated alphabetically organized AVS.     Please see AVS for specific instructions unique to this visit that I personally wrote and verbalized to the the pt in detail and then reviewed with pt  by my nurse highlighting any  changes in therapy recommended at today's visit to their plan of care.

## 2019-02-12 ENCOUNTER — Telehealth: Payer: Self-pay | Admitting: Internal Medicine

## 2019-02-12 ENCOUNTER — Ambulatory Visit: Payer: No Typology Code available for payment source | Admitting: Family Medicine

## 2019-02-12 NOTE — Telephone Encounter (Signed)
Pt is calling back & can be reached at  [phone].  Pt would like to be called today if possible.

## 2019-02-12 NOTE — Telephone Encounter (Signed)
Spoke with patient. She stated that she was scheduled to return to work on 02/07/19. She has not been to return due to her cough and shortness of breath. 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 She feels that she will need another 2 weeks to prepare herself and lungs for work.   She stated that her cough is almost gone but she is still dealing with SOB. Did offer an televisit or video visit but she declined. She would like for the work to excuse her from work until 1/18.   MW, please advise if you are ok with this. Thanks!

## 2019-02-13 ENCOUNTER — Ambulatory Visit (INDEPENDENT_AMBULATORY_CARE_PROVIDER_SITE_OTHER): Payer: Self-pay | Admitting: Internal Medicine

## 2019-02-13 ENCOUNTER — Encounter: Payer: Self-pay | Admitting: Internal Medicine

## 2019-02-13 ENCOUNTER — Other Ambulatory Visit: Payer: Self-pay

## 2019-02-13 ENCOUNTER — Encounter: Payer: Self-pay | Admitting: *Deleted

## 2019-02-13 DIAGNOSIS — R059 Cough, unspecified: Secondary | ICD-10-CM

## 2019-02-13 DIAGNOSIS — R05 Cough: Secondary | ICD-10-CM

## 2019-02-13 DIAGNOSIS — R0602 Shortness of breath: Secondary | ICD-10-CM

## 2019-02-13 MED ORDER — PREDNISONE 10 MG PO TABS: ORAL_TABLET | ORAL | 0 refills | Status: DC

## 2019-02-13 MED FILL — predniSONE 10 MG TABS: 10 | 6 days supply | Qty: 14 | Fill #0

## 2019-02-13 NOTE — Telephone Encounter (Signed)
Called and spoke to pt. Informed her of the recs per MW, appt scheduled with MW for this afternoon at 4pm, pt aware to bring all her medications with her. Nothing further needed at this time.   Will forward to MW as Lorain Childes.

## 2019-02-13 NOTE — Telephone Encounter (Signed)
Can' t doe this as I already  documented she was able to return to work Jan 1 so will need an office visit for justification and be sure to bring all meds with her and we can cover the period from Jan 1 to whatever we decide is appropriate at the time of the ov.

## 2019-02-13 NOTE — Patient Instructions (Addendum)
Change gabapentin to 300 mg take one with bfast, lunch supper and bedtime   Prednisone 10 mg take  4 each am x 2 days,   2 each am x 2 days,  1 each am x 2 days and stop   For drainage / throat tickle try take CHLORPHENIRAMINE  4 mg  (Chlortab 4mg   at should be easiest to find in the green box)  take one every 4 hours as needed - available over the counter- may cause drowsiness so start with just a bedtime dose or two and see how you tolerate it before trying in daytime    Continue Pantoprazole (protonix) 40 mg   Take  30-60 min before first meal of the day and Pepcid (famotidine)  20 mg one @  bedtime until return to office - this is the best way to tell whether stomach acid is contributing to your problem.    To get the most out of exercise, you need to be continuously aware that you are short of breath, but never out of breath, for 30 minutes daily. As you improve, it will actually be easier for you to do the same amount of exercise  in  30 minutes so always push to the level where you are short of breath.     Ok to return to work 02/24/19  -- if not able to return to work you will need another office visit

## 2019-02-13 NOTE — Progress Notes (Signed)
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 since when?  and around 2005 noted onset cough dx of asthma in [place] with season change typically exac with uri  And in no need for any maint rx other protonix but then while on ppi April 25 2018 severe cough /sob refractory to saba hfa/neb/singulair/flonase/ zyrtec/no prednisone > put out of work since March 19th and not worked since and 4 ov's with HCP's  So self referred to pulmonary 06/25/2018       History of Present Illness  06/25/2018  Pulmonary/ 1st office eval/Catherine Kane  Chief Complaint  Patient presents with  . Pulmonary Consult    Self referral. Pt c/o cough and increased SOB for the past several wks. Cough is non prod.   Dyspnea:  Mailbox and back prior to March 19 no problem, flat 50 ft ok now struggles /makes her cough Cough: dry/ day > noct more with movement , sitting fine, sleeping fine / speaking makes it worse / so does laughing/ blowing up balloon SABA use: 2 hfa's 1 neb no better / pred always helps some for a few days at leas rec Stop lisinopril and start micardis 40 mg one daily  Prednisone 10 mg take  4 each am x 2 days,   2 each am x 2 days,  1 each am x 2 days and stop  Change protonix to 40 mg Take 30-60 min before first meal of the day and pepcid 20 mg after supper until you return  Best cough syrup is delsym 2tsp every 12 hours as needed  GERD  Only use your albuterol as a rescue medication Please schedule a follow up office visit in 6 weeks, call sooner if needed with all medications /inhalers/ solutions in hand so we can verify exactly what you are taking. This includes all medications from all doctors and over the counters    07/10/2018  f/u ov/Catherine Kane re: severe cough not  resp to gabapentin 300 tid and delsym  Chief Complaint  Patient presents with  . Follow-up    Patient states that her dry cough is about the same since last visit. She states that she uses her resue inhaler twice daily.  Dyspnea:  Mostly sob when coughing Cough: dry honking 24/7  Sleeping: on side wedge pillow  SABA use: albuterol not helping rec No work for 2 weeks  Depomedrol 120 mg IM today  Take delsym two tsp every 12 hours and supplement if needed with  tramadol 50 mg up to 2 every 4 hours to suppress the urge to cough. Once you have eliminated the cough for 3 straight days try reducing the tramadol first,  then the delsym as tolerated.   For drainage / throat tickle try take CHLORPHENIRAMINE  4 mg  (Chlortab 4mg   at [employer] should be easiest to find in the green box)  take one every 4 hours as needed      07/25/2018 acute extended ov/Catherine Kane re:  Severe cough off ace x 4 weeks Chief Complaint  Patient presents with  . Acute Visit    Increased cough over the past 4 days- non prod.   tickle when lie down  only taking 1 h1 at hs (instructions were to take up to 2 ) no better with saba  Sleeping on wedge   Not limited by breathing from desired activities   rec For drainage / throat tickle try take CHLORPHENIRAMINE  4 mg  Increase gabapentin to 300 mg 4 x daily  Increase pepcid to 10 mg x 2 x 1 hours before bedtime  depomedrol 120 mg    08/08/2018  f/u ov/Catherine Kane re: off ace x 6 weeks  maint on singulair /  Improving cough  Chief Complaint  Patient presents with  . Follow-up    Cough has improved some.   Dyspnea:  Limited by back not breath Cough: 95% better, did cough with grass exp/laughing w/in one week of ov  Sleeping: fine on wedge  SABA use: none 02: none  rec No change rx  Return to July 18th, coughing same day working than she does at home    08/29/2018  f/u ov/Catherine Kane re: uacs vs cough variant asthma Chief Complaint  Patient presents with  . Acute Visit     Pt states she is continuing to have dry cough and SOB. She states she can not work at all due to these symptoms.   Dyspnea:  MMRC3 = can't walk 100 yards even at a slow pace at a flat grade s stopping due to sob  /back pain  Cough: harsh honking no mucus Sleeping: on wedge/ recliner worse flat coughing/ gasping SABA use: albterol not helping  02: none  Reports better p depomedrol rx  rec Depomedrol 120 mg IM  For drainage / throat tickle try take CHLORPHENIRAMINE  4 mg  (Chlortab 4mg   at should be easiest to find in the green box)  take one every 4 hours as needed - available over the counter- may cause drowsiness so start with just a bedtime dose or two and see how you tolerate it before trying in daytime   Symbicort 80 Take 2 puffs first thing in am and then another 2 puffs about 12 hours later.  Please schedule a follow up office visit in 2 weeks, sooner if needed  with all medications /inhalers/ solutions in hand so we can verify exactly what you are taking. This includes all medications from all doctors and over the counters    09/17/2018  f/u ov/Catherine Kane re:  Prior March 2020 no need any resp rx / still tickle in throat / avg tramadol 5 per day  Chief Complaint  Patient presents with  . Follow-up    doing ok "as long as I don't move"  Dyspnea:  50 ft even if not coughing  Cough: dry honking sound  Sleeping: wedge x 30 degrees since early March 2020 /  SABA use: albuterol not helping not even neb  02: none  rec Depomedrol 120 mg IM today and stop symbicort Take delsym two tsp every 12 hours and supplement if needed with  Tylenol #3   up to 1-2 every 4 hoursd.    Continue gabapentin 300 mg four times daily     10/11/2018  f/u ov/Catherine Kane re: cough x March 2020 Chief Complaint  Patient presents with  . Follow-up    Cough is much improved- has not coughed in a wk.    Dyspnea:  MMRC1 = can walk nl pace, flat grade, can't hurry or go uphills or steps s sob   Cough: only  tylenol #3 x one at hs s delsym or h1 at hs  Sleeping: wedge  rec Increase chlorpheniramine to 4 mg x 2 an hour before bed   12/24/18 televisit recs   covid symptom onset 12/18/18  Prednisone 10 mg take  4 each am x 2 days,   2 each am x 2 days,  1 each am x 2 days and stop  Take delsym two tsp every 12 hours and supplement if needed with  Tylenol #3   up to 1-2 every 4 hours to suppress the urge to cough. Once you have eliminated the cough for 3 straight days try reducing the Tylenol #3 first,  then the delsym as tolerated.  Broth based soups/ noodles crackers   01/01/2019 televisit recs Take delsym two tsp every 12 hours and supplement if needed with  Tylenol #4 up to 1 every 4 hours > did not obtain as not avail [facility]   Once you have eliminated the cough for 3 straight days try reducing the Tylenol #4 first,  then the delsym as tolerated.   Prednisone 10 mg take  4 each am x 2 days,   2 each am x 2 days,  1 each am x 2 days and stop      01/22/2019  f/u ov/Catherine Kane re: cough relapsed p covid dx Nov 11 unable to work housekeeping for [employer]  Patient presents with  . Acute Visit    increased cough since dx with covid 19 12/20/18.  Dyspnea:  Only when coughing  Cough: 24/7 honking with gen ant chest discomfort with coughing/ no better on tyl #3  Sleeping 45 degrees  SABA use: not helping  02: none rec Prednisone 10 mg take  4 each am x 2 days,   2 each am x 2 days,  1 each am x 2 days and stop Take delsym two tsp every 12 hours and supplement if needed with  Tylenol #4   up to 1-2 every 4 hours to suppress the urge to cough.  Once you have eliminated the cough for 3 straight days try reducing the Tylenol #4 first,  then the delsym as tolerated.    Continue singulair and stop zyrtec and  For drainage / throat tickle try take CHLORPHENIRAMINE  4 mg  (Chlortab 4mg   at [employer] should be easiest to find in the green box)  take 2every 4 hours as  needed  Cough into flutter valve    01/29/2019  f/u ov/Catherine Kane re: recurrent cyclical cough x  9604 / maint on ppi and singulair / brought all meds but pepcid Chief Complaint  Patient presents with  . Follow-up    Cough last week, better, SOB with activity  Dyspnea:  Across parking lot / up steps ok  Cough: none and off  cough meds x 2 d prior to [place]: able to lie flat  - has 4 bed blocks instead of 2 rec under HOB SABA use: none  02: none  rec Pepcid 20 mg after supper or at  bedtime x 2 weeks then add back for first sign of cough  Bed blocks x 6-8 inches under head of bed    02/13/2019  f/u ov/Catherine Kane re: cyclical coughing x 5409 Chief Complaint  Patient presents with  . Acute Visit    still having DOE at work while wearing mask. She has not used neb recently.   Dyspnea:  Main problem is walking across the hospital to clear rooms, worse with bending over also Cough: much better  still occ,dry daytime  Sleeping: fine one bed blocks  SABA use: none  02: none    No obvious day to day or daytime variability or assoc excess/ purulent sputum or mucus plugs or hemoptysis or cp or chest tightness, subjective wheeze or overt sinus or hb symptoms.   Sleeping as above  without nocturnal  or early am exacerbation  of respiratory  c/o's or need for noct saba. Also denies any obvious fluctuation of symptoms with weather or environmental changes or other aggravating or alleviating factors except as outlined above   No unusual exposure hx or h/o childhood pna/ asthma or knowledge of premature birth.  Current Allergies, Complete Past Medical History, Past Surgical History, Family History, and Social History were reviewed in [employer] record.  ROS  The following are not active complaints unless bolded Hoarseness, sore throat, dysphagia, dental problems, itching, sneezing,  nasal congestion or discharge of excess mucus or purulent secretions, ear ache,   fever, chills,  sweats, unintended wt loss or wt gain, classically pleuritic or exertional cp,  orthopnea pnd or arm/hand swelling  or leg swelling, presyncope, palpitations, abdominal pain, anorexia, nausea, vomiting, diarrhea  or change in bowel habits or change in bladder habits, change in stools or change in urine, dysuria, hematuria,  rash, arthralgias, visual complaints, headache, numbness, weakness or ataxia or problems with walking or coordination,  change in mood or  memory.        Current Meds  Medication Sig  . albuterol (PROVENTIL) (2.5 MG/3ML) 0.083% nebulizer solution Take 2.5 mg by nebulization every 6 (six) hours as needed for wheezing or shortness of breath.  Marland Kitchen amLODipine (NORVASC) 10 MG tablet Take 1 tablet (10 mg total) by mouth daily.  Marland Kitchen aspirin EC 81 MG tablet Take 81 mg by mouth daily.  . chlorpheniramine (CHLOR-TRIMETON) 4 MG tablet Take 4 mg by mouth every 4 (four) hours as needed for allergies.  Marland Kitchen dextromethorphan (DELSYM) 30 MG/5ML liquid Take 15 mg by mouth as needed for cough.  . famotidine (PEPCID) 20 MG tablet One after supper  . Ferrous Sulfate (IRON) 325 (65 Fe) MG TABS Take 1 tablet by mouth daily.  Marland Kitchen FLUoxetine (PROZAC) 20 MG tablet Take 1 tablet (20 mg total) by mouth daily.  Marland Kitchen gabapentin (NEURONTIN) 300 MG capsule Take 2 capsules (600 mg) 3 times a day.  . hydrochlorothiazide (HYDRODIURIL) 25 MG tablet Take 1 tablet (25 mg total) by mouth daily.  Marland Kitchen ibuprofen (ADVIL) 800 MG tablet Take 1 tablet (800 mg total) by mouth every 8 (eight) hours as needed for mild pain or moderate pain.  . methocarbamol (ROBAXIN) 500 MG tablet Take 1 tablet (500 mg total) by mouth 2 (two) times daily as needed for muscle spasms.  . montelukast (SINGULAIR) 10 MG tablet Take 1 tablet (10 mg total) by mouth at bedtime.  . pantoprazole (PROTONIX) 40 MG tablet TAKE 1 TABLET (40 MG TOTAL) BY MOUTH DAILY. TAKE 30-60 MIN BEFORE FIRST MEAL OF THE DAY  . Respiratory Therapy Supplies (FLUTTER) DEVI Use as  directed  . telmisartan (MICARDIS) 40 MG tablet Take 1 tablet (40 mg total) by mouth daily.  . traMADol (ULTRAM) 50 MG tablet Take 50 mg by mouth every 6 (six) hours as needed.  . valACYclovir (VALTREX) 500 MG tablet Take 500 mg by mouth 2 (two) times daily.  . Vitamin D, Ergocalciferol, (DRISDOL) 1.25 MG (50000 UT) CAPS capsule Take 1 capsule (50,000 Units total) by mouth every 7 (seven) days.  Objective:     02/13/2019          216  01/29/2019      218 01/22/2019      218  10/11/2018          225  09/17/2018        215  08/29/2018        214  08/08/2018          213  07/25/2018        215   07/10/18 210 lb (95.3 kg)  06/25/18 208 lb 9.6 oz (94.6 kg)  05/31/18 200 lb (90.7 kg)      Vital signs reviewed - Note on arrival 02 sats  97% on RA      HEENT : pt wearing mask not removed for exam due to covid -19 concerns.    NECK :  without JVD/Nodes/TM/ nl carotid upstrokes bilaterally   LUNGS: no acc muscle use,  Nl contour chest which is clear to A and P bilaterally with dry cough on insp maneuvers   CV:  RRR  no s3 or murmur or increase in P2, and no edema   ABD:  soft and nontender with nl inspiratory excursion in the supine position. No bruits or organomegaly appreciated, bowel sounds nl  MS:  Nl gait/ ext warm without deformities, calf tenderness, cyanosis or clubbing No obvious joint restrictions   SKIN: warm and dry without lesions    NEURO:  alert, approp, nl sensorium with  no motor or cerebellar deficits apparent.               Assessment

## 2019-02-14 ENCOUNTER — Encounter: Payer: Self-pay | Admitting: Internal Medicine

## 2019-02-14 NOTE — Assessment & Plan Note (Signed)
Onset March 2020 assoc with UACS 09/17/2018   Walked RA  2 laps @  approx 277ft each @ avg  pace  stopped due to  End of study, sob and cough but sats still 98%   - 02/13/2019   Walked RA x two laps =  approx 561ft @ avg pace - stopped due to end of study with cough and sob 9 with sats of 96% at the end of the study.  Clearly can't work if can't walk the halls at [employer] s cough/ sob which likely much worse when cleaning a room once she gets there - rec 2 more weeks off and if still can't return either needs to find another job or return here for further w/u.  Each maintenance medication was reviewed in detail including most importantly the difference between maintenance and as needed and under what circumstances the prns are to be used.  Please see AVS for specific  Instructions which are unique to this visit and I personally typed out  which were reviewed in detail in writing with the patient and a copy provided.

## 2019-02-14 NOTE — Assessment & Plan Note (Signed)
Onset cough March 2020 with pattern of recurrence x 2010  - d/c acei 06/25/2018 and max rx for gerd  - cyclical cough rx 07/10/2018  - Allergy profile 07/25/2018 >  Eos 0.3 /  IgE 52 with RAST pos ragweed, grass, trees dust  - 08/29/2018  After extensive coaching inhaler device,  effectiveness =    75% try symb 80 2bid x 2 weeks > no improvement in cough or doe so d/c 09/17/2018 > cough 95% resolved 10/11/2018 s codeine x at hs  - 11/12/2018 cough remains resolved, still on gabapentin but no cough meds/ no inhalers  - recurred 12/18/18 with subseq dx of COVID 19 rx : 12/24/2018  pred x 6 days, clear liquids, cough sup with Tyl #3  - repeat cyclical cough rx 01/01/2019 with tyl #4 / pred x 6 days > did not do - repeat with tyl #4/ restart singulair/ max h1/ flutter valve and return in one week > "resolved" off cough meds 01/29/2019  - 02/13/2019 increase gabapentin to 300 qid with goal of no cough at all  Clearly still coughing though says "better" so explained again:  Of the three most common causes of  Sub-acute / recurrent or chronic cough, only one (GERD)  can actually contribute to/ trigger  the other two (asthma and post nasal drip syndrome)  and perpetuate the cylce of cough.  While not intuitively obvious, many patients with chronic low grade reflux do not cough until there is a primary insult that disturbs the protective epithelial barrier and exposes sensitive nerve endings.   This is typically viral but can due to PNDS and  either may apply here.     >>> The point is that once this occurs, it is difficult to eliminate the cycle  using anything but a maximally effective acid suppression regimen at least in the short run, accompanied by an appropriate diet to address non acid GERD and control / eliminate the cough itself with gabapentin 300 qid, 1st gen H1 blockers per guidelines   And also added 6 days of Prednisone in case of component of Th-2 driven upper or lower airways inflammation (if cough  responds short term only to relapse befor return while will on rx for uacs that would point to allergic rhinitis/ asthma or eos bronchitis)

## 2019-02-28 ENCOUNTER — Ambulatory Visit (INDEPENDENT_AMBULATORY_CARE_PROVIDER_SITE_OTHER): Payer: Self-pay | Admitting: Internal Medicine

## 2019-02-28 ENCOUNTER — Other Ambulatory Visit: Payer: Self-pay

## 2019-02-28 DIAGNOSIS — R05 Cough: Secondary | ICD-10-CM

## 2019-02-28 DIAGNOSIS — R059 Cough, unspecified: Secondary | ICD-10-CM

## 2019-02-28 MED ORDER — TRAMADOL HCL 50 MG PO TABS: 50.0000 mg | ORAL_TABLET | ORAL | 0 refills | Status: DC | PRN

## 2019-02-28 MED ORDER — PREDNISONE 10 MG PO TABS: ORAL_TABLET | ORAL | 0 refills | Status: DC

## 2019-02-28 MED FILL — ?PREDINSONE 10MG TABLETS: 10 | 6 days supply | Qty: 14 | Fill #0

## 2019-02-28 MED FILL — traMADol HCL 50 MG TABS: 50 | 6 days supply | Qty: 40 | Fill #0

## 2019-02-28 NOTE — Progress Notes (Signed)
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 since when?  and around 2005 noted onset cough dx of asthma in [place] with season change typically exac with uri  And in no need for any maint rx other protonix but then while on ppi April 25 2018 severe cough /sob refractory to saba hfa/neb/singulair/flonase/ zyrtec/no prednisone > put out of work since March 19th and not worked since and 4 ov's with HCP's  So self referred to pulmonary 06/25/2018       History of Present Illness  06/25/2018  Pulmonary/ 1st office eval/Toneka Fullen  Chief Complaint  Patient presents with  . Pulmonary Consult    Self referral. Pt c/o cough and increased SOB for the past several wks. Cough is non prod.   Dyspnea:  Mailbox and back prior to March 19 no problem, flat 50 ft ok now struggles /makes her cough Cough: dry/ day > noct more with movement , sitting fine, sleeping fine / speaking makes it worse / so does laughing/ blowing up balloon SABA use: 2 hfa's 1 neb no better / pred always helps some for a few days at leas rec Stop lisinopril and start micardis 40 mg one daily  Prednisone 10 mg take  4 each am x 2 days,   2 each am x 2 days,  1 each am x 2 days and stop  Change protonix to 40 mg Take 30-60 min before first meal of the day and pepcid 20 mg after supper until you return  Best cough syrup is delsym 2tsp every 12 hours as needed  GERD  Only use your albuterol as a rescue medication Please schedule a follow up office visit in 6 weeks, call sooner if needed with all medications /inhalers/ solutions in hand so we can verify exactly what you are taking. This includes all medications from all doctors and over the counters    07/10/2018  f/u ov/Brynna Dobos re: severe cough not  resp to gabapentin 300 tid and delsym  Chief Complaint  Patient presents with  . Follow-up    Patient states that her dry cough is about the same since last visit. She states that she uses her resue inhaler twice daily.  Dyspnea:  Mostly sob when coughing Cough: dry honking 24/7  Sleeping: on side wedge pillow  SABA use: albuterol not helping rec No work for 2 weeks  Depomedrol 120 mg IM today  Take delsym two tsp every 12 hours and supplement if needed with  tramadol 50 mg up to 2 every 4 hours to suppress the urge to cough. Once you have eliminated the cough for 3 straight days try reducing the tramadol first,  then the delsym as tolerated.   For drainage / throat tickle try take CHLORPHENIRAMINE  4 mg  (Chlortab 4mg   at [facility] should be easiest to find in the green box)  take one every 4 hours as needed      07/25/2018 acute extended ov/Dilan Fullenwider re:  Severe cough off ace x 4 weeks Chief Complaint  Patient presents with  . Acute Visit    Increased cough over the past 4 days- non prod.   tickle when lie down  only taking 1 h1 at hs (instructions were to take up to 2 ) no better with saba  Sleeping on wedge   Not limited by breathing from desired activities   rec For drainage / throat tickle try take CHLORPHENIRAMINE  4 mg  Increase gabapentin to 300 mg 4 x daily  Increase pepcid to 10 mg x 2 x 1 hours before bedtime  depomedrol 120 mg    08/08/2018  f/u ov/Mahrukh Seguin re: off ace x 6 weeks  maint on singulair /  Improving cough  Chief Complaint  Patient presents with  . Follow-up    Cough has improved some.   Dyspnea:  Limited by back not breath Cough: 95% better, did cough with grass exp/laughing w/in one week of ov  Sleeping: fine on wedge  SABA use: none 02: none  rec No change rx  Return to July 18th, coughing same day working than she does at home    08/29/2018  f/u ov/Shakaria Raphael re: uacs vs cough variant asthma Chief Complaint  Patient presents with  . Acute Visit     Pt states she is continuing to have dry cough and SOB. She states she can not work at all due to these symptoms.   Dyspnea:  MMRC3 = can't walk 100 yards even at a slow pace at a flat grade s stopping due to sob  /back pain  Cough: harsh honking no mucus Sleeping: on wedge/ recliner worse flat coughing/ gasping SABA use: albterol not helping  02: none  Reports better p depomedrol rx  rec Depomedrol 120 mg IM  For drainage / throat tickle try take CHLORPHENIRAMINE  4 mg  (Chlortab 4mg   at should be easiest to find in the green box)  take one every 4 hours as needed - available over the counter- may cause drowsiness so start with just a bedtime dose or two and see how you tolerate it before trying in daytime   Symbicort 80 Take 2 puffs first thing in am and then another 2 puffs about 12 hours later.  Please schedule a follow up office visit in 2 weeks, sooner if needed  with all medications /inhalers/ solutions in hand so we can verify exactly what you are taking. This includes all medications from all doctors and over the counters.    09/17/2018  f/u ov/Reona Zendejas re:  Prior March 2020 no need any resp rx / still tickle in throat / avg tramadol 5 per day  Chief Complaint  Patient presents with  . Follow-up    doing ok "as long as I don't move"  Dyspnea:  50 ft even if not coughing  Cough: dry honking sound  Sleeping: wedge x 30 degrees since early March 2020 /  SABA use: albuterol not helping not even neb  02: none  rec Depomedrol 120 mg IM today and stop symbicort Take delsym two tsp every 12 hours and supplement if needed with  Tylenol #3   up to 1-2 every 4 hoursd.    Continue gabapentin 300 mg four times daily     10/11/2018  f/u ov/Jodene Polyak re: cough x March 2020 Chief Complaint  Patient presents with  . Follow-up    Cough is much improved- has not coughed in a wk.    Dyspnea:  MMRC1 = can walk nl pace, flat grade, can't hurry or go uphills or steps s sob   Cough: only  tylenol #3 x one at hs s delsym or h1 at hs  Sleeping: wedge  rec Increase chlorpheniramine to 4 mg x 2 an hour before bed   12/24/18 televisit recs   covid symptom onset 12/18/18  Prednisone 10 mg take  4 each am x 2 days,   2 each am x 2 days,  1 each am x 2 days and stop  Take delsym two tsp every 12 hours and supplement if needed with  Tylenol #3   up to 1-2 every 4 hours to suppress the urge to cough. Once you have eliminated the cough for 3 straight days try reducing the Tylenol #3 first,  then the delsym as tolerated.  Broth based soups/ noodles crackers   01/01/2019 televisit recs Take delsym two tsp every 12 hours and supplement if needed with  Tylenol #4 up to 1 every 4 hours > did not obtain as not avail at [facility]   Once you have eliminated the cough for 3 straight days try reducing the Tylenol #4 first,  then the delsym as tolerated.   Prednisone 10 mg take  4 each am x 2 days,   2 each am x 2 days,  1 each am x 2 days and stop      01/22/2019  f/u ov/Bentleigh Waren re: cough relapsed p covid dx Nov 11 unable to work housekeeping for [employer]  Patient presents with  . Acute Visit    increased cough since dx with covid 19 12/20/18.  Dyspnea:  Only when coughing  Cough: 24/7 honking with gen ant chest discomfort with coughing/ no better on tyl #3  Sleeping 45 degrees  SABA use: not helping  02: none rec Prednisone 10 mg take  4 each am x 2 days,   2 each am x 2 days,  1 each am x 2 days and stop Take delsym two tsp every 12 hours and supplement if needed with  Tylenol #4   up to 1-2 every 4 hours to suppress the urge to cough.  Once you have eliminated the cough for 3 straight days try reducing the Tylenol #4 first,  then the delsym as tolerated.    Continue singulair and stop zyrtec and  For drainage / throat tickle try take CHLORPHENIRAMINE  4 mg  (Chlortab 4mg   at [facility] should be easiest to find in the green box)  take 2every 4 hours as  needed  Cough into flutter valve    01/29/2019  f/u ov/Estus Krakowski re: recurrent cyclical cough x  9604 / maint on ppi and singulair / brought all meds but pepcid Chief Complaint  Patient presents with  . Follow-up    Cough last week, better, SOB with activity  Dyspnea:  Across parking lot / up steps ok  Cough: none and off  cough meds x 2 d prior to [place]: able to lie flat  - has 4 bed blocks instead of 2 rec under HOB SABA use: none  02: none  rec Pepcid 20 mg after supper or at  bedtime x 2 weeks then add back for first sign of cough  Bed blocks x 6-8 inches under head of bed    02/13/2019  f/u ov/Sharnika Binney re: cyclical coughing x 5409 Chief Complaint  Patient presents with  . Acute Visit    still having DOE at work while wearing mask. She has not used neb recently.   Dyspnea:  Main problem is walking across the hospital to clear rooms, worse with bending over also Cough: much better  still occ,dry daytime  Sleeping: fine one bed blocks  SABA use: none  02: none   rec Change gabapentin to 300 mg take one with bfast, lunch supper and bedtime  Prednisone 10 mg take  4 each am x 2 days,   2 each am x 2 days,  1 each am x 2 days and stop  For drainage / throat tickle try take CHLORPHENIRAMINE  4 mg  (Chlortab 4mg   at [facility] should be easiest to find in the green box)  take one every 4 hours as needed Continue Pantoprazole (protonix) 40 mg   Take  30-60 min before first meal of the day and Pepcid (famotidine)  20 mg one @  bedtime until return to office - this is the best way to tell whether stomach acid is contributing to your problem.   To get the most out of exercise, you need to be continuously aware that you are short of breath, but never out of breath, for 30 minutes daily. As you improve, it will actually be easier for you to do the same amount of exercise  in  30 minutes so always push to the level where you are short of breath.    Ok to return to work 02/24/19  -- if not  able to return to work you will need another office visit   Virtual Visit via Telephone Note 02/28/2019   I connected with [name] on 02/28/19 at  12:10  AM EST by telephone and verified that I am speaking with the correct person using two identifiers.   I discussed the limitations, risks, security and privacy concerns of performing an evaluation and management service by telephone and the availability of in person appointments. I also discussed with the patient that there may be a patient responsible charge related to this service. The patient expressed understanding and agreed to proceed.   History of Present Illness: Says never really stopped clearing her throat even when"better" ever since 2005 then much worse since covid in Nov 2020 - in retrospect  Neb may help some   Now back coughing 24/7 including noct    No obvious pattens in day to day or daytime variability or assoc excess/ purulent sputum or mucus plugs or hemoptysis or cp or chest tightness, subjective wheeze or overt sinus or hb symptoms.    Also denies any obvious fluctuation of symptoms with weather or environmental changes or other aggravating or alleviating factors except as outlined above.   Meds reviewed/ med reconciliation completed          Observations/Objective: Honking cough slt hoarse, no conversational sob    Assessment and Plan: See problem list for active a/p's   Follow Up Instructions: See avs for instructions unique to this ov which includes revised/ updated med list     Outpatient Encounter Medications as of 02/28/2019  Medication Sig  . albuterol (PROVENTIL) (2.5 MG/3ML) 0.083% nebulizer solution Take 2.5 mg by nebulization every 6 (six) hours as needed for wheezing or shortness of breath.  Marland Kitchen amLODipine (NORVASC) 10 MG tablet Take 1 tablet (10 mg total) by mouth daily.  Marland Kitchen aspirin EC 81 MG tablet Take 81 mg by mouth daily.  . chlorpheniramine (CHLOR-TRIMETON) 4 MG tablet  Take 4 mg by mouth every 4 (four) hours as needed for allergies.  Marland Kitchen dextromethorphan (DELSYM) 30 MG/5ML liquid Take 15 mg by mouth as needed for cough.  . famotidine (PEPCID) 20 MG tablet One after supper  .  Ferrous Sulfate (IRON) 325 (65 Fe) MG TABS Take 1 tablet by mouth daily.  Marland Kitchen FLUoxetine (PROZAC) 20 MG tablet Take 1 tablet (20 mg total) by mouth daily.  Marland Kitchen gabapentin (NEURONTIN) 300 MG capsule Take 2 capsules (600 mg) 3 times a day.  . hydrochlorothiazide (HYDRODIURIL) 25 MG tablet Take 1 tablet (25 mg total) by mouth daily.  Marland Kitchen ibuprofen (ADVIL) 800 MG tablet Take 1 tablet (800 mg total) by mouth every 8 (eight) hours as needed for mild pain or moderate pain.  . methocarbamol (ROBAXIN) 500 MG tablet Take 1 tablet (500 mg total) by mouth 2 (two) times daily as needed for muscle spasms.  . montelukast (SINGULAIR) 10 MG tablet Take 1 tablet (10 mg total) by mouth at bedtime.  . pantoprazole (PROTONIX) 40 MG tablet TAKE 1 TABLET (40 MG TOTAL) BY MOUTH DAILY. TAKE 30-60 MIN BEFORE FIRST MEAL OF THE DAY  . predniSONE (DELTASONE) 10 MG tablet Take  4 each am x 2 days,   2 each am x 2 days,  1 each am x 2 days and stop  . Respiratory Therapy Supplies (FLUTTER) DEVI Use as directed  . telmisartan (MICARDIS) 40 MG tablet Take 1 tablet (40 mg total) by mouth daily.  . traMADol (ULTRAM) 50 MG tablet Take 1 tablet (50 mg total) by mouth every 4 (four) hours as needed.  . valACYclovir (VALTREX) 500 MG tablet Take 500 mg by mouth 2 (two) times daily.  . Vitamin D, Ergocalciferol, (DRISDOL) 1.25 MG (50000 UT) CAPS capsule Take 1 capsule (50,000 Units total) by mouth every 7 (seven) days.  .     .    .       I discussed the assessment and treatment plan with the patient. The patient was provided an opportunity to ask questions and all were answered. The patient agreed with the plan and demonstrated an understanding of the instructions.   The patient was advised to call back or seek an in-person  evaluation if the symptoms worsen or if the condition fails to improve as anticipated.  I provided 15 minutes of non-face-to-face time during this encounter.   Sandrea Hughs, MD

## 2019-02-28 NOTE — Patient Instructions (Addendum)
Take delsym two tsp every 12 hours and supplement if needed with  tramadol 50 mg up to 2 every 4 hours to suppress the urge to cough. Swallowing water and/or using ice chips/non mint and menthol containing candies (such as lifesavers or sugarless jolly ranchers) are also effective.  You should rest your voice and avoid activities that you know make you cough.  Once you have eliminated the cough for 3 straight days try reducing the tramadol first,  then the delsym as tolerated.   Stop prozac while on high doses of tramadol  Prednisone 10 mg take  4 each am x 2 days,   2 each am x 2 days,  1 each am x 2 days and stop   For breathing ok to use nebulized albuterol 2.5mg  up to every 4 hours if you feel it really helps  No work until you see Dr Lucie Leather.   We will make you an appt to see Dr Lucie Leather for allergy evaluation  - take all active medications when you see him so he can be sure exactly how you take  Them

## 2019-03-01 ENCOUNTER — Encounter: Payer: Self-pay | Admitting: Internal Medicine

## 2019-03-01 NOTE — Assessment & Plan Note (Signed)
Onset cough March 2020 with pattern of recurrence x 2005 - d/c acei 06/25/2018 and max rx for gerd  - cyclical cough rx 07/10/2018  - Allergy profile 07/25/2018 >  Eos 0.3 /  IgE 52 with RAST pos ragweed, grass, trees dust  - 08/29/2018  After extensive coaching inhaler device,  effectiveness =    75% try symb 80 2bid x 2 weeks > no improvement in cough or doe so d/c 09/17/2018 > cough 95% resolved 10/11/2018 s codeine x at hs  - 11/12/2018 cough remains resolved, still on gabapentin but no cough meds/ no inhalers  - recurred 12/18/18 with subseq dx of COVID 19 rx : 12/24/2018  pred x 6 days, clear liquids, cough sup with Tyl #3  - repeat cyclical cough rx 01/01/2019 with tyl #4 / pred x 6 days > did not do - repeat with tyl #4/ restarted singulair/ max h1/ flutter valve and return in one week > "resolved" off cough meds 01/29/2019  (but still clearing throat)  - 02/13/2019 increased gabapentin to 300 qid with goal of no cough at all - 02/28/2019 cough flare off pred/ tyl #4 > rx pred again x 6 days/ pred x 6 days and refer to allergy    Clear to me today she hasn't consistently stopped clearing throat since onset 2005 and when she says better she's just talking about the severe honking component > Of the three most common causes of  Sub-acute / recurrent or chronic cough, only one (GERD)  can actually contribute to/ trigger  the other two (asthma and post nasal drip syndrome)  and perpetuate the cylce of cough.  While not intuitively obvious, many patients with chronic low grade reflux do not cough until there is a primary insult that disturbs the protective epithelial barrier and exposes sensitive nerve endings.   This is typically viral but can due to PNDS and  either may apply here.   The point is that once this occurs, it is difficult to eliminate the cycle  using anything but a maximally effective acid suppression regimen at least in the short run, accompanied by an appropriate diet to address non acid GERD  and control / eliminate the cough itself for at least 3 days with tramadol and f/u with Allergy next.  Each maintenance medication was reviewed in detail including most importantly the difference between maintenance and as needed and under what circumstances the prns are to be used.  Please see AVS for specific  Instructions which are unique to this visit and I personally typed out  which were reviewed in detail over the phone with the patient and a copy provided via MyChart

## 2019-03-03 ENCOUNTER — Other Ambulatory Visit: Payer: Self-pay | Admitting: Family Medicine

## 2019-03-03 NOTE — Telephone Encounter (Signed)
Refill for tramadol. Please advise.

## 2019-03-04 ENCOUNTER — Encounter: Payer: Self-pay | Admitting: Emergency Medicine

## 2019-03-04 ENCOUNTER — Telehealth: Payer: Self-pay | Admitting: Internal Medicine

## 2019-03-04 NOTE — Telephone Encounter (Signed)
Called and spoke to pt. Pt states she is scheduled to see her allergist in Feb and needs a work note until that appt date. Per MW's last note it states 'No work until you see Dr Lucie Leather". Work note has been generated, pt wanting to pick up letter. This has been placed up front for pick up. Pt verbalized understanding and denied any further questions or concerns at this time.

## 2019-03-24 ENCOUNTER — Ambulatory Visit: Payer: Self-pay | Admitting: Allergy and Immunology

## 2019-03-27 ENCOUNTER — Telehealth: Payer: Self-pay

## 2019-03-27 NOTE — Telephone Encounter (Signed)
Catherine Kane has an in office appointment on Friday morning at 10:20 AM. She want to know if you can do a "TELE VISIT" instead? She is not having any problems. Only needs to talk to you.

## 2019-03-28 ENCOUNTER — Ambulatory Visit (INDEPENDENT_AMBULATORY_CARE_PROVIDER_SITE_OTHER): Payer: Self-pay | Admitting: Family Medicine

## 2019-03-28 ENCOUNTER — Encounter: Payer: Self-pay | Admitting: Family Medicine

## 2019-03-28 ENCOUNTER — Other Ambulatory Visit: Payer: Self-pay

## 2019-03-28 DIAGNOSIS — R002 Palpitations: Secondary | ICD-10-CM

## 2019-03-28 DIAGNOSIS — Z09 Encounter for follow-up examination after completed treatment for conditions other than malignant neoplasm: Secondary | ICD-10-CM

## 2019-03-28 DIAGNOSIS — R232 Flushing: Secondary | ICD-10-CM

## 2019-03-28 DIAGNOSIS — I10 Essential (primary) hypertension: Secondary | ICD-10-CM

## 2019-03-28 DIAGNOSIS — F419 Anxiety disorder, unspecified: Secondary | ICD-10-CM

## 2019-03-28 DIAGNOSIS — R05 Cough: Secondary | ICD-10-CM

## 2019-03-28 DIAGNOSIS — R059 Cough, unspecified: Secondary | ICD-10-CM

## 2019-03-28 MED ORDER — FLUOXETINE HCL 40 MG PO CAPS: 40.0000 mg | ORAL_CAPSULE | Freq: Every day | ORAL | 1 refills | Status: DC

## 2019-03-28 MED FILL — FLUoxetine HCL 40 MG CAPS: 40 | 30 days supply | Qty: 30 | Fill #0

## 2019-03-28 NOTE — Progress Notes (Signed)
Virtual Visit via Telephone Note  I connected with Catherine Kane on 03/28/19 at 10:20 AM EST by telephone and verified that I am speaking with the correct person using two identifiers.   I discussed the limitations, risks, security and privacy concerns of performing an evaluation and management service by telephone and the availability of in person appointments. I also discussed with the patient that there may be a patient responsible charge related to this service. The patient expressed understanding and agreed to proceed.   History of Present Illness: Past Surgical History:  Procedure Laterality Date  . TONSILLECTOMY    . TUBAL LIGATION      Social History   Socioeconomic History  . Marital status: Single    Spouse name: Not on file  . Number of children: Not on file  . Years of education: Not on file  . Highest education level: 12th grade  Occupational History  . Not on file  Tobacco Use  . Smoking status: Never Smoker  . Smokeless tobacco: Never Used  Substance and Sexual Activity  . Alcohol use: No  . Drug use: No  . Sexual activity: Yes    Birth control/protection: Surgical  Other Topics Concern  . Not on file  Social History Narrative  . Not on file   Social Determinants of Health   Financial Resource Strain:   . Difficulty of Paying Living Expenses: Not on file  Food Insecurity:   . Worried About Programme researcher, broadcasting/film/video in the Last Year: Not on file  . Ran Out of Food in the Last Year: Not on file  Transportation Needs: No Transportation Needs  . Lack of Transportation (Medical): No  . Lack of Transportation (Non-Medical): No  Physical Activity:   . Days of Exercise per Week: Not on file  . Minutes of Exercise per Session: Not on file  Stress:   . Feeling of Stress : Not on file  Social Connections:   . Frequency of Communication with Friends and Family: Not on file  . Frequency of Social Gatherings with Friends and Family: Not on file  .  Attends Religious Services: Not on file  . Active Member of Clubs or Organizations: Not on file  . Attends Banker Meetings: Not on file  . Marital Status: Not on file  Intimate Partner Violence:   . Fear of Current or Ex-Partner: Not on file  . Emotionally Abused: Not on file  . Physically Abused: Not on file  . Sexually Abused: Not on file    Family History  Problem Relation Age of Onset  . Heart disease Mother   . Heart disease Sister   . Breast cancer Maternal Aunt   . Breast cancer Maternal Grandmother     No Known Allergies   Past Medical History:  Diagnosis Date  . Asthma   . Bronchitis   . Coronavirus infection 12/22/2018  . Hypertension   . Vitamin D deficiency 10/2018   Patient Active Problem List   Diagnosis Date Noted  . Anxiety 01/11/2019  . Well woman exam with routine gynecological exam 11/26/2018  . Breast lump on left side at 12 o'clock position 11/26/2018  . Chronic obstructive pulmonary disease (HCC) 10/01/2018  . Muscle spasm 10/01/2018  . Sciatic pain, right 10/01/2018  . Essential hypertension 06/25/2018  . Chest congestion 05/23/2018  . Shortness of breath 05/03/2018  . Cough 05/03/2018    Current Status: Since her last office visit, she is doing well with no  complaints. She has c/o back pain since weather change. She also has frequent shortness of breath. She has followed up with Pulmonologist, but states that he has referred her to an additional specialist for further evaluation. She denies visual changes, chest pain,shortness of breath, heart palpitations, and falls. She has occasional headaches and dizziness with position changes. Denies severe headaches, confusion, seizures, double vision, and blurred vision, nausea and vomiting. Her anxiety is mild today. She denies suicidal ideations, homicidal ideations, or auditory hallucinations. She denies fevers, chills, fatigue, recent infections, weight loss, and night sweats. No reports of  GI problems such as diarrhea, and constipation. She has no reports of blood in stools, dysuria and hematuria. she denies pain today.   Observations/Objective:  Telephone Virtual Visit   Assessment and Plan:  1. Essential hypertension She will continue to take medications as prescribed, to decrease high sodium intake, excessive alcohol intake, increase potassium intake, smoking cessation, and increase physical activity of at least 30 minutes of cardio activity daily. She will continue to follow Heart Healthy or DASH diet.  2. Cough  3. Heart palpitations  4. Hot flashes  5. Anxiety  6. Follow up She will follow up in 3 months.   No orders of the defined types were placed in this encounter.   No orders of the defined types were placed in this encounter.   Referral Orders  No referral(s) requested today    Kathe Becton,  MSN, FNP-BC [place] [address] [place], [place] [address] [phone]- fax     I discussed the assessment and treatment plan with the patient. The patient was provided an opportunity to ask questions and all were answered. The patient agreed with the plan and demonstrated an understanding of the instructions.   The patient was advised to call back or seek an in-person evaluation if the symptoms worsen or if the condition fails to improve as anticipated.  I provided 20 minutes of non-face-to-face time during this encounter.   Azzie Glatter, FNP

## 2019-04-03 ENCOUNTER — Other Ambulatory Visit: Payer: Self-pay | Admitting: Family Medicine

## 2019-04-03 MED FILL — GABAPENTIN 300 MG CAPSULE: 300 | 30 days supply | Qty: 180 | Fill #1

## 2019-04-03 MED FILL — ?HYDROCHLOROTHIAZIDE 25MG T: 25 | 30 days supply | Qty: 30 | Fill #0

## 2019-04-03 MED FILL — ?AMLODIPINE BESYL 10MG TABL: 10 | 30 days supply | Qty: 30 | Fill #2

## 2019-04-03 MED FILL — METHOCARBAMOL 500 MG TABS: 500 | 30 days supply | Qty: 60 | Fill #1

## 2019-04-03 MED FILL — ?PANTOPRAZOLE SO DR 40MG TA: 40 | 30 days supply | Qty: 30 | Fill #2

## 2019-04-03 MED FILL — ?IBUPROFEN 800 MG TABS: AMNEAL | 10 days supply | Qty: 30 | Fill #1

## 2019-04-08 ENCOUNTER — Telehealth: Payer: Self-pay | Admitting: Family Medicine

## 2019-04-08 NOTE — Telephone Encounter (Signed)
Wanted to discuss/explain paperwork sent over for short term disability. Please call pt back.

## 2019-04-17 ENCOUNTER — Telehealth: Payer: Self-pay | Admitting: Internal Medicine

## 2019-04-17 NOTE — Telephone Encounter (Signed)
Dr. Sherene Sires,  This patient was last seen by you on 02/13/19 for cough and shortness of breath.   The original letter created for the patient dated 03/04/19 had her out of work until 03/27/19.  Based on the referral placed for this patient and the message received from the patient, does she need to come in to see you again before a letter writing her out of work can be issued?

## 2019-04-17 NOTE — Telephone Encounter (Signed)
Just set her up for a televist for  04/18/19 and I'll see if I can sort out

## 2019-04-17 NOTE — Telephone Encounter (Signed)
Spoke with patient. She has been scheduled for a televisit tomorrow at 1130 with MW.

## 2019-04-18 ENCOUNTER — Ambulatory Visit (INDEPENDENT_AMBULATORY_CARE_PROVIDER_SITE_OTHER): Payer: Self-pay | Admitting: Internal Medicine

## 2019-04-18 ENCOUNTER — Other Ambulatory Visit: Payer: Self-pay

## 2019-04-18 ENCOUNTER — Encounter: Payer: Self-pay | Admitting: Internal Medicine

## 2019-04-18 DIAGNOSIS — R059 Cough, unspecified: Secondary | ICD-10-CM

## 2019-04-18 DIAGNOSIS — R05 Cough: Secondary | ICD-10-CM

## 2019-04-18 DIAGNOSIS — R0602 Shortness of breath: Secondary | ICD-10-CM

## 2019-04-18 DIAGNOSIS — M5431 Sciatica, right side: Secondary | ICD-10-CM

## 2019-04-18 MED ORDER — GABAPENTIN 300 MG PO CAPS: 300.0000 mg | ORAL_CAPSULE | Freq: Four times a day (QID) | ORAL | Status: DC

## 2019-04-18 NOTE — Progress Notes (Signed)
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 since when?  and around 2005 noted onset cough dx of asthma in [place] with season change typically exac with uri  And in no need for any maint rx other protonix but then while on ppi April 25 2018 severe cough /sob refractory to saba hfa/neb/singulair/flonase/ zyrtec/no prednisone > put out of work since March 19th and not worked since and 4 ov's with HCP's  So self referred to pulmonary 06/25/2018       History of Present Illness  06/25/2018  Pulmonary/ 1st office eval/Catherine Kane  Chief Complaint  Patient presents with  . Pulmonary Consult    Self referral. Pt c/o cough and increased SOB for the past several wks. Cough is non prod.   Dyspnea:  Mailbox and back prior to March 19 no problem, flat 50 ft ok now struggles /makes her cough Cough: dry/ day > noct more with movement , sitting fine, sleeping fine / speaking makes it worse / so does laughing/ blowing up balloon SABA use: 2 hfa's 1 neb no better / pred always helps some for a few days at leas rec Stop lisinopril and start micardis 40 mg one daily  Prednisone 10 mg take  4 each am x 2 days,   2 each am x 2 days,  1 each am x 2 days and stop  Change protonix to 40 mg Take 30-60 min before first meal of the day and pepcid 20 mg after supper until you return  Best cough syrup is delsym 2tsp every 12 hours as needed  GERD  Only use your albuterol as a rescue medication Please schedule a follow up office visit in 6 weeks, call sooner if needed with all medications /inhalers/ solutions in hand so we can verify exactly what you are taking. This includes all medications from all doctors and over the counters    07/10/2018  f/u ov/Catherine Kane re: severe cough not  resp to gabapentin 300 tid and delsym  Chief Complaint  Patient presents with  . Follow-up    Patient states that her dry cough is about the same since last visit. She states that she uses her resue inhaler twice daily.  Dyspnea:  Mostly sob when coughing Cough: dry honking 24/7  Sleeping: on side wedge pillow  SABA use: albuterol not helping rec No work for 2 weeks  Depomedrol 120 mg IM today  Take delsym two tsp every 12 hours and supplement if needed with  tramadol 50 mg up to 2 every 4 hours to suppress the urge to cough. Once you have eliminated the cough for 3 straight days try reducing the tramadol first,  then the delsym as tolerated.   For drainage / throat tickle try take CHLORPHENIRAMINE  4 mg  (Chlortab 4mg   at [facility] should be easiest to find in the green box)  take one every 4 hours as needed      07/25/2018 acute extended ov/Catherine Kane re:  Severe cough off ace x 4 weeks Chief Complaint  Patient presents with  . Acute Visit    Increased cough over the past 4 days- non prod.   tickle when lie down  only taking 1 h1 at hs (instructions were to take up to 2 ) no better with saba  Sleeping on wedge   Not limited by breathing from desired activities   rec For drainage / throat tickle try take CHLORPHENIRAMINE  4 mg  Increase gabapentin to 300 mg 4 x daily  Increase pepcid to 10 mg x 2 x 1 hours before bedtime  depomedrol 120 mg    08/08/2018  f/u ov/Catherine Kane re: off ace x 6 weeks  maint on singulair /  Improving cough  Chief Complaint  Patient presents with  . Follow-up    Cough has improved some.   Dyspnea:  Limited by back not breath Cough: 95% better, did cough with grass exp/laughing w/in one week of ov  Sleeping: fine on wedge  SABA use: none 02: none  rec No change rx  Return to July 18th, coughing same day working than she does at home    08/29/2018  f/u ov/Catherine Kane re: uacs vs cough variant asthma Chief Complaint  Patient presents with  . Acute Visit     Pt states she is continuing to have dry cough and SOB. She states she can not work at all due to these symptoms.   Dyspnea:  MMRC3 = can't walk 100 yards even at a slow pace at a flat grade s stopping due to sob  /back pain  Cough: harsh honking no mucus Sleeping: on wedge/ recliner worse flat coughing/ gasping SABA use: albterol not helping  02: none  Reports better p depomedrol rx  rec Depomedrol 120 mg IM  For drainage / throat tickle try take CHLORPHENIRAMINE  4 mg  (Chlortab 4mg   at should be easiest to find in the green box)  take one every 4 hours as needed - available over the counter- may cause drowsiness so start with just a bedtime dose or two and see how you tolerate it before trying in daytime   Symbicort 80 Take 2 puffs first thing in am and then another 2 puffs about 12 hours later.  Please schedule a follow up office visit in 2 weeks, sooner if needed  with all medications /inhalers/ solutions in hand so we can verify exactly what you are taking. This includes all medications from all doctors and over the counters.    09/17/2018  f/u ov/Catherine Kane re:  Prior March 2020 no need any resp rx / still tickle in throat / avg tramadol 5 per day  Chief Complaint  Patient presents with  . Follow-up    doing ok "as long as I don't move"  Dyspnea:  50 ft even if not coughing  Cough: dry honking sound  Sleeping: wedge x 30 degrees since early March 2020 /  SABA use: albuterol not helping not even neb  02: none  rec Depomedrol 120 mg IM today and stop symbicort Take delsym two tsp every 12 hours and supplement if needed with  Tylenol #3   up to 1-2 every 4 hoursd.    Continue gabapentin 300 mg four times daily     10/11/2018  f/u ov/Catherine Kane re: cough x March 2020 Chief Complaint  Patient presents with  . Follow-up    Cough is much improved- has not coughed in a wk.    Dyspnea:  MMRC1 = can walk nl pace, flat grade, can't hurry or go uphills or steps s sob   Cough: only  tylenol #3 x one at hs s delsym or h1 at hs  Sleeping: wedge  rec Increase chlorpheniramine to 4 mg x 2 an hour before bed   12/24/18 televisit recs  covid symptom onset 12/18/18  Prednisone 10 mg take  4 each am x 2 days,   2 each am x 2 days,  1 each am x 2 days and stop  Take delsym two tsp every 12 hours and supplement if needed with  Tylenol #3   up to 1-2 every 4 hours to suppress the urge to cough. Once you have eliminated the cough for 3 straight days try reducing the Tylenol #3 first,  then the delsym as tolerated.  Broth based soups/ noodles crackers   01/01/2019 televisit recs Take delsym two tsp every 12 hours and supplement if needed with  Tylenol #4 up to 1 every 4 hours > did not obtain as not [facility]   Once you have eliminated the cough for 3 straight days try reducing the Tylenol #4 first,  then the delsym as tolerated.   Prednisone 10 mg take  4 each am x 2 days,   2 each am x 2 days,  1 each am x 2 days and stop     01/22/2019  f/u ov/Catherine Kane re: cough relapsed p covid dx Nov 11 unable to work housekeeping for [employer]  Patient presents with  . Acute Visit    increased cough since dx with covid 19 12/20/18.  Dyspnea:  Only when coughing  Cough: 24/7 honking with gen ant chest discomfort with coughing/ no better on tyl #3  Sleeping 45 degrees  SABA use: not helping  02: none rec Prednisone 10 mg take  4 each am x 2 days,   2 each am x 2 days,  1 each am x 2 days and stop Take delsym two tsp every 12 hours and supplement if needed with  Tylenol #4   up to 1-2 every 4 hours to suppress the urge to cough.  Once you have eliminated the cough for 3 straight days try reducing the Tylenol #4 first,  then the delsym as tolerated.    Continue singulair and stop zyrtec and  For drainage / throat tickle try take CHLORPHENIRAMINE  4 mg  (Chlortab 4mg   at should be easiest to find in the green box)  take 2every 4 hours as needed    Cough into flutter valve    01/29/2019  f/u ov/Catherine Kane re: recurrent cyclical cough x  2010 / maint on ppi and singulair / brought all meds but pepcid Chief Complaint  Patient presents with  . Follow-up    Cough last week, better, SOB with activity  Dyspnea:  Across parking lot / up steps ok  Cough: none and off  cough meds x 2 d prior to OV   Sleeping: able to lie flat  - has 4 bed blocks instead of 2 rec under HOB SABA use: none  02: none  rec Pepcid 20 mg after supper or at  bedtime x 2 weeks then add back for first sign of cough  Bed blocks x 6-8 inches under head of bed    02/13/2019  f/u ov/Catherine Kane re: cyclical coughing x 2010 Chief Complaint  Patient presents with  . Acute Visit    still having DOE at work while wearing mask. She has not used neb recently.   Dyspnea:  Main problem is walking across the hospital to clear rooms, worse with bending over also Cough: much better still  occ,dry daytime  Sleeping: fine one bed blocks  SABA use: none  02: none   rec Change gabapentin to 300 mg take one with bfast, lunch supper and bedtime  Prednisone 10 mg take  4 each am x 2 days,   2 each am x 2 days,  1 each am x 2 days and stop  For drainage / throat tickle try take CHLORPHENIRAMINE  4 mg  (Chlortab 4mg   at [employer] should be easiest to find in the green box)  take one every 4 hours as needed Continue Pantoprazole (protonix) 40 mg   Take  30-60 min before first meal of the day and Pepcid (famotidine)  20 mg one @  bedtime until return to office - this is the best way to tell whether stomach acid is contributing to your problem.   To get the most out of exercise, you need to be continuously aware that you are short of breath, but never out of breath, for 30 minutes daily. As you improve, it will actually be easier for you to do the same amount of exercise  in  30 minutes so always push to the level where you are short of breath.    Ok to return to work 02/24/19  -- if not able  to return to work you will need another office visit   Virtual Visit via Telephone Note 02/28/2019   I connected with Catherine Kane on 02/28/19 at  12:10  AM EST by telephone and verified that I am speaking with the correct person using two identifiers.   I discussed the limitations, risks, security and privacy concerns of performing an evaluation and management service by telephone and the availability of in person appointments. I also discussed with the patient that there may be a patient responsible charge related to this service. The patient expressed understanding and agreed to proceed.    History of Present Illness: Says never really stopped clearing her throat even when"better" ever since 2005 then much worse since covid in Nov 2020 - in retrospect  Neb may help some   Now back coughing 24/7 including noct  rec Take delsym two tsp every 12 hours and supplement if needed with  tramadol 50 mg up to 2 every 4 hours to suppress the urge to cough.  Once you have eliminated the cough for 3 straight days try reducing the tramadol first,  then the delsym as tolerated.  Stop prozac while on high doses of tramadol Prednisone 10 mg take  4 each am x 2 days,   2 each am x 2 days,  1 each am x 2 days and stop  For breathing ok to use nebulized albuterol 2.5mg  up to every 4 hours if you feel it really helps No work until you see Dr Neldon Mc.   Virtual Visit via Telephone Note 04/18/2019  Cough x 15 years  I connected with Catherine Kane on 04/18/19 at 8:30 AM EST by telephone and verified that I am speaking with the correct person using two identifiers.   I discussed the limitations, risks, security and privacy concerns of performing an evaluation and management service by telephone and the availability of in person appointments. I also discussed with the patient that there may be a patient responsible charge related to this service. The patient expressed understanding and  agreed to proceed.   History of Present Illness: Out since Dec 16 2018 with onset of covid 19 not able to work at all  getting disability benefits but no medical insurance and can't see Kozlow until gets help  Dyspnea:  Says can't get thru grocery even if not coughing x 2-3 months  Cough: dry honking / flutter valve not helping  Sleeping: p 2 chlorpheniramine and gabapentin 300  and deslym / on side bed / wedged no breathing problem breathing coughing or neb need SABA use: just neb rarely  02: No   No obvious day to day or daytime variability or assoc excess/ purulent sputum or mucus plugs or hemoptysis or cp or chest tightness, subjective wheeze or overt sinus or hb symptoms.    Also denies any obvious fluctuation of symptoms with weather or environmental changes or other aggravating or alleviating factors except as outlined above.   Meds reviewed/ med reconciliation completed         Observations/Objective: Honking cough with voice use, no conversational sob unless coughing   Assessment and Plan: See problem list for active a/p's   Follow Up Instructions: See avs for instructions unique to this ov which includes revised/ updated med list     I discussed the assessment and treatment plan with the patient. The patient was provided an opportunity to ask questions and all were answered. The patient agreed with the plan and demonstrated an understanding of the instructions.   The patient was advised to call back or seek an in-person evaluation if the symptoms worsen or if the condition fails to improve as anticipated.  I provided 15  minutes of non-face-to-face time during this encounter.   Sandrea Hughs, MD

## 2019-04-18 NOTE — Patient Instructions (Signed)
Try albuterol 15 min before an activity that you know would make you short of breath and see if it makes any difference and if makes none then don't take it after activity unless you can't catch your breath.

## 2019-04-18 NOTE — Assessment & Plan Note (Signed)
Onset March 2020 assoc with [facility] 09/17/2018   Walked RA  2 laps @  approx 259ft each @ avg  pace  stopped due to  End of study, sob and cough but sats still 98%   - 02/13/2019   Walked RA x two laps =  approx 515ft @ avg pace - stopped due to end of study with cough and sob  with sats of 96% at the end of the study.    States even if not coughing still sob so rec: I spent extra time with pt today reviewing appropriate use of albuterol for prn use on exertion with the following points: 1) saba is for relief of sob that does not improve by walking a slower pace or resting but rather if the pt does not improve after trying this first.  2)  if there is an activity that reproducibly causes the symptoms, try the saba neb  15 min before the activity on alternate days   If in fact the saba really does help, then fine to continue to use it prn but advised may need to look closer at the maintenance regimen being used to achieve better control of airways disease with exertion.

## 2019-04-18 NOTE — Assessment & Plan Note (Signed)
Onset cough March 2020 with pattern of recurrence x 2005 - d/c acei 06/25/2018 and max rx for gerd  - cyclical cough rx 07/10/2018  - Allergy profile 07/25/2018 >  Eos 0.3 /  IgE 52 with RAST pos ragweed, grass, trees dust  - 08/29/2018  After extensive coaching inhaler device,  effectiveness =    75% try symb 80 2bid x 2 weeks > no improvement in cough or doe so d/c 09/17/2018 > cough 95% resolved 10/11/2018 s codeine x at hs  - 11/12/2018 cough remains resolved, still on gabapentin but no cough meds/ no inhalers  - recurred 12/18/18 with subseq dx of COVID 19 rx : 12/24/2018  pred x 6 days, clear liquids, cough sup with Tyl #3  - repeat cyclical cough rx 01/01/2019 with tyl #4 / pred x 6 days > did not do - repeat with tyl #4/ restarted singulair/ max h1/ flutter valve and return in one week > "resolved" off cough meds 01/29/2019  (but still clearing throat)  - 02/13/2019 increased gabapentin to 300 qid with goal of no cough at all - 02/28/2019 cough flare off pred/ tyl #4 > rx pred again x 6 days/ pred x 6 days and refer to allergy > says did not help and could not afford to pay cash to see so seeking assistance  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 Delford Field at [facility] next but nothing else to offer in the pulmonary clinic.   Ok to stay on short term disablity x 3 more months to sort out address/resolve habitual cough.    Each maintenance medication was reviewed in detail including most importantly the difference between maintenance and as needed and under what circumstances the prns are to be used.  Please see AVS for specific  Instructions which are unique to this visit and I personally typed out  which were reviewed in detail over the phone with the patient and a copy provided via MyChart

## 2019-04-21 ENCOUNTER — Encounter: Payer: Self-pay | Admitting: Family Medicine

## 2019-04-21 ENCOUNTER — Telehealth: Payer: Self-pay | Admitting: Family Medicine

## 2019-04-22 ENCOUNTER — Telehealth: Payer: Self-pay | Admitting: Internal Medicine

## 2019-04-22 ENCOUNTER — Other Ambulatory Visit: Payer: Self-pay | Admitting: Internal Medicine

## 2019-04-22 MED FILL — ?ERGOCALCIFEROL 50000 UNITC: 1.25 MG | 77 days supply | Qty: 11 | Fill #2

## 2019-04-22 MED FILL — FLUoxetine HCL 20 MG TABS: 20 | 30 days supply | Qty: 30 | Fill #2

## 2019-04-22 MED FILL — traMADol HCL 50 MG TABS: 50 | 6 days supply | Qty: 40 | Fill #0

## 2019-04-22 MED FILL — MONTELUKAST SOD 10 MG TAB: 10 | 30 days supply | Qty: 30 | Fill #2

## 2019-04-22 NOTE — Telephone Encounter (Signed)
Dr. Sherene Sires, please advise if you are okay with med being refilled.

## 2019-04-22 NOTE — Telephone Encounter (Signed)
Spoke with patient. She was calling to follow up on a letter for work that mentioned during her last televisit with MW on 3/12. She stated that MW stated that he would be ok with her staying out of work for 3 months until she can be evaluated by another specialist for her cough.   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 Delford Field at [facility] next but nothing else to offer in the pulmonary clinic.   Ok to stay on short term disablity x 3 more months to sort out address/resolve habitual cough."  Once the letter has been signed, she would like to come by the office to pick it up.   MW, please advise if you are ok with [place] typing a letter for the patient? Thanks!

## 2019-04-23 ENCOUNTER — Ambulatory Visit: Payer: No Typology Code available for payment source

## 2019-04-23 NOTE — Telephone Encounter (Signed)
Spoke with patient. She is aware that MW is ok with the letter. Will type the letter today and will have MW to sign in the morning. Will call the patient once this has been done.   Routing to myself for follow up tomorrow.

## 2019-04-23 NOTE — Telephone Encounter (Signed)
That's fine

## 2019-04-23 NOTE — Telephone Encounter (Signed)
Done

## 2019-04-24 NOTE — Telephone Encounter (Signed)
Letter has been signed by MW. Patient is aware that the letter is ready for pickup at the door.

## 2019-04-25 ENCOUNTER — Telehealth: Payer: Self-pay

## 2019-04-25 NOTE — Telephone Encounter (Signed)
Do you need  to put a referral in for PT? So Catherine Kane can have the endurance section filled out?

## 2019-04-25 NOTE — Telephone Encounter (Signed)
Catherine Kane has been advised that Raliegh Ip NP,  does not fill out the "Attending Physicians Statement" for endurance. She will need to be evaluated by PT.  The  section will be placed at the front desk for pick up. She has been advised to call back if anything else is needed.

## 2019-05-02 ENCOUNTER — Ambulatory Visit: Payer: Medicaid Other | Admitting: Family Medicine

## 2019-05-08 ENCOUNTER — Telehealth: Payer: Self-pay

## 2019-05-08 NOTE — Telephone Encounter (Signed)
Message left for Catherine Kane to call back. Leonard Downing,  with [employer]  (PH# [phone]) was questioning the "Attending Physician" section of FMLA forms.

## 2019-05-08 NOTE — Telephone Encounter (Signed)
Per Donnamarie the FMLA paperwork  has been sent to her Pulmonary MD office too, for completion.   I have asked her to call back if she needs a referral to another source for completion. I will also ask Dorene Grebe about it on Monday.

## 2019-05-21 ENCOUNTER — Telehealth: Payer: Self-pay | Admitting: Internal Medicine

## 2019-05-22 ENCOUNTER — Telehealth: Payer: Self-pay

## 2019-05-22 NOTE — Telephone Encounter (Signed)
Rec'd completed paperwork - fwd to [employer] via interoffice mail -pr  

## 2019-05-22 NOTE — Telephone Encounter (Signed)
Please re-fax FMLA forms. Left in your 'purple' folder on yesterday, 05/21/2019.    Message text   Kallie Locks, FNP routed conversation to You 59 minutes ago (10:37 AM)  You routed conversation to Kallie Locks, FNP 2 weeks ago  You 2 weeks ago  RP   Per Tanishia the [facility] paperwork  has been sent to her Pulmonary MD office too, for completion.   I have asked her to call back if she needs a referral to another source for completion. I will also ask Dorene Grebe about it on Monday.      Documentation   You 2 weeks ago  RP   Message left for Cally Nygard to call back. Leonard Downing,  with [employer]  (PH# [phone]) was questioning the "Attending Physician" section of FMLA forms.       Re-faxed. Task completed.

## 2019-05-22 NOTE — Telephone Encounter (Signed)
Done and returned to [facility]

## 2019-05-28 ENCOUNTER — Ambulatory Visit: Payer: Self-pay

## 2019-05-28 ENCOUNTER — Other Ambulatory Visit: Payer: Self-pay

## 2019-06-20 ENCOUNTER — Other Ambulatory Visit: Payer: Self-pay | Admitting: Internal Medicine

## 2019-06-24 ENCOUNTER — Ambulatory Visit: Payer: No Typology Code available for payment source | Admitting: Family Medicine

## 2019-07-01 ENCOUNTER — Ambulatory Visit: Payer: Medicaid Other | Admitting: Family Medicine

## 2019-07-01 ENCOUNTER — Telehealth: Payer: Self-pay | Admitting: Internal Medicine

## 2019-07-01 NOTE — Telephone Encounter (Addendum)
Dr.Wert can you please advise Thank you  Spoke with Patient regarding prior message . Patient stated that she was taken out of work until 07/20/19 but would like to go back 07/17/19 and she will need a letter to return back .

## 2019-07-02 NOTE — Telephone Encounter (Signed)
Fine to write letter for RTW 07/17/19 no restrictions

## 2019-07-02 NOTE — Telephone Encounter (Signed)
Letter sent to patient as requested by Dr. Sherene Sires.

## 2019-07-24 ENCOUNTER — Other Ambulatory Visit: Payer: Self-pay | Admitting: Family Medicine

## 2019-07-24 DIAGNOSIS — E559 Vitamin D deficiency, unspecified: Secondary | ICD-10-CM

## 2019-07-24 MED FILL — ?ERGOCALCIFEROL 50000 UNITC: 1.25 MG | 28 days supply | Qty: 4 | Fill #0

## 2019-09-07 DIAGNOSIS — F419 Anxiety disorder, unspecified: Secondary | ICD-10-CM

## 2019-09-07 DIAGNOSIS — E538 Deficiency of other specified B group vitamins: Secondary | ICD-10-CM

## 2019-09-07 HISTORY — DX: Deficiency of other specified B group vitamins: E53.8

## 2019-09-07 HISTORY — DX: Anxiety disorder, unspecified: F41.9

## 2019-09-08 ENCOUNTER — Other Ambulatory Visit: Payer: Medicaid Other

## 2019-09-08 ENCOUNTER — Other Ambulatory Visit: Payer: Self-pay | Admitting: Family Medicine

## 2019-09-08 ENCOUNTER — Other Ambulatory Visit: Payer: Self-pay

## 2019-09-08 ENCOUNTER — Ambulatory Visit (INDEPENDENT_AMBULATORY_CARE_PROVIDER_SITE_OTHER): Payer: Self-pay | Admitting: Family Medicine

## 2019-09-08 ENCOUNTER — Encounter: Payer: Self-pay | Admitting: Family Medicine

## 2019-09-08 VITALS — BP 150/103 | HR 71 | Temp 98.4°F | Resp 16 | Ht 63.0 in | Wt 172.0 lb

## 2019-09-08 DIAGNOSIS — R634 Abnormal weight loss: Secondary | ICD-10-CM

## 2019-09-08 DIAGNOSIS — M62838 Other muscle spasm: Secondary | ICD-10-CM

## 2019-09-08 DIAGNOSIS — M5431 Sciatica, right side: Secondary | ICD-10-CM

## 2019-09-08 DIAGNOSIS — R059 Cough, unspecified: Secondary | ICD-10-CM

## 2019-09-08 DIAGNOSIS — F419 Anxiety disorder, unspecified: Secondary | ICD-10-CM

## 2019-09-08 DIAGNOSIS — I10 Essential (primary) hypertension: Secondary | ICD-10-CM

## 2019-09-08 DIAGNOSIS — R232 Flushing: Secondary | ICD-10-CM

## 2019-09-08 DIAGNOSIS — R05 Cough: Secondary | ICD-10-CM

## 2019-09-08 DIAGNOSIS — Z09 Encounter for follow-up examination after completed treatment for conditions other than malignant neoplasm: Secondary | ICD-10-CM

## 2019-09-08 DIAGNOSIS — Z0000 Encounter for general adult medical examination without abnormal findings: Secondary | ICD-10-CM

## 2019-09-08 DIAGNOSIS — J449 Chronic obstructive pulmonary disease, unspecified: Secondary | ICD-10-CM

## 2019-09-08 DIAGNOSIS — G4700 Insomnia, unspecified: Secondary | ICD-10-CM

## 2019-09-08 DIAGNOSIS — R0602 Shortness of breath: Secondary | ICD-10-CM

## 2019-09-08 LAB — POCT GLYCOSYLATED HEMOGLOBIN (HGB A1C)
HbA1c POC (<> result, manual entry): 5.3 % (ref 4.0–5.6)
HbA1c, POC (controlled diabetic range): 5.3 % (ref 0.0–7.0)
HbA1c, POC (prediabetic range): 5.3 % — AB (ref 5.7–6.4)
Hemoglobin A1C: 5.3 % (ref 4.0–5.6)

## 2019-09-08 LAB — POCT URINALYSIS DIPSTICK
Bilirubin, UA: NEGATIVE
Blood, UA: NEGATIVE
Glucose, UA: NEGATIVE
Ketones, UA: NEGATIVE
Leukocytes, UA: NEGATIVE
Nitrite, UA: NEGATIVE
Protein, UA: NEGATIVE
Spec Grav, UA: >=1.03 — AB (ref 1.010–1.025)
Urobilinogen, UA: 0.2 E.U./dL
pH, UA: 7 (ref 5.0–8.0)

## 2019-09-08 LAB — GLUCOSE, POCT (MANUAL RESULT ENTRY): POC Glucose: 91 mg/dl (ref 70–99)

## 2019-09-08 MED ORDER — BUSPIRONE HCL 10 MG PO TABS: 10.0000 mg | ORAL_TABLET | Freq: Three times a day (TID) | ORAL | 6 refills | Status: DC

## 2019-09-08 MED ORDER — PANTOPRAZOLE SODIUM 40 MG PO TBEC: 40.0000 mg | DELAYED_RELEASE_TABLET | Freq: Every day | ORAL | 11 refills | Status: DC

## 2019-09-08 MED ORDER — MONTELUKAST SODIUM 10 MG PO TABS: 10.0000 mg | ORAL_TABLET | Freq: Every day | ORAL | 11 refills | Status: DC

## 2019-09-08 MED ORDER — METHOCARBAMOL 500 MG PO TABS: 500.0000 mg | ORAL_TABLET | Freq: Two times a day (BID) | ORAL | 6 refills | Status: DC | PRN

## 2019-09-08 MED ORDER — AMLODIPINE BESYLATE 10 MG PO TABS: 10.0000 mg | ORAL_TABLET | Freq: Every day | ORAL | 11 refills | Status: DC

## 2019-09-08 MED ORDER — FLUOXETINE HCL 40 MG PO CAPS: 40.0000 mg | ORAL_CAPSULE | Freq: Every day | ORAL | 11 refills | Status: DC

## 2019-09-08 MED ORDER — CLONIDINE HCL 0.1 MG PO TABS
0.1000 mg | ORAL_TABLET | Freq: Once | ORAL | Status: AC
  Administered 2019-09-08: 0.1 mg via ORAL

## 2019-09-08 MED ORDER — TELMISARTAN 40 MG PO TABS: 40.0000 mg | ORAL_TABLET | Freq: Every day | ORAL | 11 refills | Status: DC

## 2019-09-08 MED ORDER — CLONIDINE HCL 0.2 MG PO TABS: 0.2000 mg | ORAL_TABLET | Freq: Once | ORAL | Status: DC

## 2019-09-08 MED ORDER — GABAPENTIN 300 MG PO CAPS: 300.0000 mg | ORAL_CAPSULE | Freq: Four times a day (QID) | ORAL | 11 refills | Status: DC

## 2019-09-08 MED ORDER — ALBUTEROL SULFATE (2.5 MG/3ML) 0.083% IN NEBU: 2.5000 mg | INHALATION_SOLUTION | Freq: Four times a day (QID) | RESPIRATORY_TRACT | 11 refills | Status: DC | PRN

## 2019-09-08 MED ORDER — FAMOTIDINE 20 MG PO TABS: ORAL_TABLET | ORAL | 11 refills | Status: DC

## 2019-09-08 MED ORDER — HYDROCHLOROTHIAZIDE 25 MG PO TABS: 25.0000 mg | ORAL_TABLET | Freq: Every day | ORAL | 11 refills | Status: DC

## 2019-09-08 MED FILL — ?PANTOPRAZOLE SODI DR 40MGT: 40 | 30 days supply | Qty: 30 | Fill #0

## 2019-09-08 MED FILL — METHOCARBAMOL 500 MG TABS: 500 | 30 days supply | Qty: 60 | Fill #0

## 2019-09-08 MED FILL — HYDROCHLOROTHIAZIDE 25 MG T: 25 | 30 days supply | Qty: 30 | Fill #0

## 2019-09-08 MED FILL — AMLODIPINE BESYLATE 10 MG T: 10 | 30 days supply | Qty: 30 | Fill #0

## 2019-09-08 MED FILL — ALBUTEROL SUL 2.5 MG/3 ML S: (2.5 MG/3ML | 6 days supply | Qty: 75 | Fill #0

## 2019-09-08 MED FILL — FLUoxetine HCL 40 MG CAPS: 40 | 30 days supply | Qty: 30 | Fill #0

## 2019-09-08 MED FILL — TELMISARTAN 40 MG TABS: 40 | 30 days supply | Qty: 30 | Fill #0

## 2019-09-08 MED FILL — busPIRone HCL 10 MG TABS: 10 | 30 days supply | Qty: 90 | Fill #0

## 2019-09-08 MED FILL — FAMOTIDINE 20 MG TABS: 20 | 30 days supply | Qty: 30 | Fill #0

## 2019-09-08 MED FILL — MONTELUKAST SOD 10 MG TAB: 10 | 30 days supply | Qty: 30 | Fill #0

## 2019-09-08 NOTE — Progress Notes (Signed)
[facility]    Established Patient Office Visit  Subjective:  Patient ID: Catherine Kane, female    DOB: May 11, 1970  Age: 49 y.o. MRN: [id number]  CC:  Chief Complaint  Patient presents with  . Follow-up    Pt states she is here for her f/u appt. Pt states she had covid in 12/2018 and she is still having shortness of breath , anxeity has went up. along with depression.. Pt also  states her cycle is coming on X 2 every other month. Pt states her blood clots are more and cramping more. Pt states her R side of her body from finger tips to her Rtoes are in pain.    HPI Catherine Kane is a 49 year old female who presents for Follow Up today.   Patient Active Problem List   Diagnosis Date Noted  . Anxiety 01/11/2019  . Well woman exam with routine gynecological exam 11/26/2018  . Breast lump on left side at 12 o'clock position 11/26/2018  . Chronic obstructive pulmonary disease (HCC) 10/01/2018  . Muscle spasm 10/01/2018  . Sciatic pain, right 10/01/2018  . Essential hypertension 06/25/2018  . Chest congestion 05/23/2018  . Shortness of breath 05/03/2018  . Cough 05/03/2018    Past Medical History:  Diagnosis Date  . Anxiety 09/2019  . Asthma   . Bronchitis   . Coronavirus infection 12/22/2018  . Hypertension   . Vitamin D deficiency 10/2018   Current Status: Since his last office visit, she is doing well with no complaints. Her blood pressure readings are elevated today. She states that she has not taken her blood pressure medications to day as of yet. She reports occasional shortness of breath. He has not had any headaches, visual changes, dizziness, and falls. No chest pain, heart palpitations, cough and shortness of breath reported. He denies fevers, chills, fatigue, recent infections, weight loss, and night sweats. Denies GI problems such as nausea, vomiting, diarrhea, and constipation. She has no  reports of blood in stools, dysuria and hematuria. No depression or anxiety, and denies suicidal ideations, homicidal ideations, or auditory hallucinations. He is taking all medications as prescribed. He denies pain today.   Past Surgical History:  Procedure Laterality Date  . TONSILLECTOMY    . TUBAL LIGATION      Family History  Problem Relation Age of Onset  . Heart disease Mother   . Heart disease Sister   . Breast cancer Maternal Aunt   . Breast cancer Maternal Grandmother     Social History   Socioeconomic History  . Marital status: Single    Spouse name: Not on file  . Number of children: Not on file  . Years of education: Not on file  . Highest education level: 12th grade  Occupational History  . Not on file  Tobacco Use  . Smoking status: Never Smoker  . Smokeless tobacco: Never Used  Vaping Use  . Vaping Use: Never used  Substance and Sexual Activity  . Alcohol use: No  . Drug use: No  . Sexual activity: Yes    Birth control/protection: Surgical  Other Topics Concern  . Not on file  Social History Narrative  . Not on file   Social Determinants of Health   Financial Resource Strain:   . Difficulty of Paying Living Expenses:   Food Insecurity:   . Worried About Programme researcher, broadcasting/film/video in the Last Year:   . [employer] of  Food in the Last Year:   Transportation Needs: No Transportation Needs  . Lack of Transportation (Medical): No  . Lack of Transportation (Non-Medical): No  Physical Activity:   . Days of Exercise per Week:   . Minutes of Exercise per Session:   Stress:   . Feeling of Stress :   Social Connections:   . Frequency of Communication with Friends and Family:   . Frequency of Social Gatherings with Friends and Family:   . Attends Religious Services:   . Active Member of Clubs or Organizations:   . Attends BankerClub or Organization Meetings:   Marland Kitchen. Marital Status:   Intimate Partner Violence:   . Fear of Current or Ex-Partner:   . Emotionally Abused:    Marland Kitchen. Physically Abused:   . Sexually Abused:     Outpatient Medications Prior to Visit  Medication Sig Dispense Refill  . aspirin EC 81 MG tablet Take 81 mg by mouth daily.    . chlorpheniramine (CHLOR-TRIMETON) 4 MG tablet Take 4 mg by mouth every 4 (four) hours as needed for allergies.    Marland Kitchen. dextromethorphan (DELSYM) 30 MG/5ML liquid Take 15 mg by mouth as needed for cough.    . ergocalciferol (VITAMIN D2) 1.25 MG (50000 UT) capsule TAKE 1 CAPSULE (50,000 UNITS TOTAL) BY MOUTH EVERY 7 (SEVEN) DAYS. 11 capsule 6  . Ferrous Sulfate (IRON) 325 (65 Fe) MG TABS Take 1 tablet by mouth daily.    Marland Kitchen. ibuprofen (ADVIL) 800 MG tablet Take 1 tablet (800 mg total) by mouth every 8 (eight) hours as needed for mild pain or moderate pain. 30 tablet 3  . Respiratory Therapy Supplies (FLUTTER) DEVI Use as directed 1 each 0  . traMADol (ULTRAM) 50 MG tablet TAKE 1 TABLET (50 MG TOTAL) BY MOUTH EVERY 4 (FOUR) HOURS AS NEEDED. 40 tablet 0  . valACYclovir (VALTREX) 500 MG tablet Take 500 mg by mouth 2 (two) times daily.    Marland Kitchen. amLODipine (NORVASC) 10 MG tablet Take 1 tablet (10 mg total) by mouth daily. 30 tablet 3  . famotidine (PEPCID) 20 MG tablet One after supper 30 tablet 11  . FLUoxetine (PROZAC) 40 MG capsule Take 1 capsule (40 mg total) by mouth daily. 90 capsule 1  . gabapentin (NEURONTIN) 300 MG capsule Take 1 capsule (300 mg total) by mouth 4 (four) times daily.    . hydrochlorothiazide (HYDRODIURIL) 25 MG tablet TAKE 1 TABLET (25 MG TOTAL) BY MOUTH DAILY. 30 tablet 1  . methocarbamol (ROBAXIN) 500 MG tablet Take 1 tablet (500 mg total) by mouth 2 (two) times daily as needed for muscle spasms. 60 tablet 2  . montelukast (SINGULAIR) 10 MG tablet Take 1 tablet (10 mg total) by mouth at bedtime. 30 tablet 3  . pantoprazole (PROTONIX) 40 MG tablet TAKE 1 TABLET (40 MG TOTAL) BY MOUTH DAILY. TAKE 30-60 MIN BEFORE FIRST MEAL OF THE DAY 30 tablet 5  . telmisartan (MICARDIS) 40 MG tablet Take 1 tablet (40 mg total)  by mouth daily. 30 tablet 11  . albuterol (PROVENTIL) (2.5 MG/3ML) 0.083% nebulizer solution Take 2.5 mg by nebulization every 6 (six) hours as needed for wheezing or shortness of breath. (Patient not taking: Reported on 09/08/2019)     No facility-administered medications prior to visit.    No Known Allergies  ROS Review of Systems  Constitutional: Negative.   HENT: Negative.   Eyes: Negative.   Respiratory: Negative.   Cardiovascular: Negative.   Gastrointestinal: Positive for abdominal distention.  Endocrine: Negative.   Genitourinary: Negative.   Musculoskeletal: Positive for arthralgias (generalized joint pain).  Skin: Negative.   Allergic/Immunologic: Negative.   Neurological: Positive for dizziness (occasional ) and headaches (occasional ).  Hematological: Negative.   Psychiatric/Behavioral: Negative.       Objective:    Physical Exam Vitals and nursing note reviewed.  Constitutional:      Appearance: Normal appearance.  HENT:     Head: Normocephalic and atraumatic.     Nose: Nose normal.     Mouth/Throat:     Mouth: Mucous membranes are moist.     Pharynx: Oropharynx is clear.  Cardiovascular:     Rate and Rhythm: Normal rate and regular rhythm.     Pulses: Normal pulses.     Heart sounds: Normal heart sounds.  Pulmonary:     Effort: Pulmonary effort is normal.     Breath sounds: Normal breath sounds.  Abdominal:     General: Bowel sounds are normal.     Palpations: Abdomen is soft.  Musculoskeletal:        General: Normal range of motion.     Cervical back: Normal range of motion and neck supple.  Skin:    General: Skin is warm and dry.  Neurological:     General: No focal deficit present.     Mental Status: She is alert and oriented to person, place, and time.  Psychiatric:        Mood and Affect: Mood normal.        Behavior: Behavior normal.        Thought Content: Thought content normal.        Judgment: Judgment normal.     BP (!) 150/103    Pulse 71   Temp 98.4 F (36.9 C)   Resp 16   Ht 5\' 3"  (1.6 m)   Wt 172 lb (78 kg)   LMP 08/25/2019 (Approximate)   SpO2 99%   BMI 30.47 kg/m  Wt Readings from Last 3 Encounters:  09/08/19 172 lb (78 kg)  02/13/19 216 lb (98 kg)  01/22/19 218 lb (98.9 kg)     Health Maintenance Due  Topic Date Due  . Hepatitis C Screening  Never done  . COVID-19 Vaccine (1) Never done  . HIV Screening  Never done  . INFLUENZA VACCINE  09/07/2019    There are no preventive care reminders to display for this patient.  Lab Results  Component Value Date   TSH 1.000 11/01/2018   Lab Results  Component Value Date   WBC 7.0 07/25/2018   HGB 14.4 07/25/2018   HCT 43.0 07/25/2018   MCV 88.4 07/25/2018   PLT 358.0 07/25/2018   Lab Results  Component Value Date   NA 139 11/01/2018   K 3.2 (L) 11/01/2018   CO2 25 11/01/2018   GLUCOSE 99 11/01/2018   BUN 11 11/01/2018   CREATININE 0.83 11/01/2018   BILITOT 0.2 11/01/2018   ALKPHOS 115 11/01/2018   AST 26 11/01/2018   ALT 57 (H) 11/01/2018   PROT 7.9 11/01/2018   ALBUMIN 4.2 11/01/2018   CALCIUM 9.7 11/01/2018   ANIONGAP 11 08/26/2016   Lab Results  Component Value Date   CHOL 209 (H) 11/01/2018   Lab Results  Component Value Date   HDL 54 11/01/2018   Lab Results  Component Value Date   LDLCALC 139 (H) 11/01/2018   Lab Results  Component Value Date   TRIG 92 11/01/2018   Lab Results  Component Value Date   CHOLHDL 3.9 11/01/2018   Lab Results  Component Value Date   HGBA1C 5.3 09/08/2019   HGBA1C 5.3 09/08/2019   HGBA1C 5.3 (A) 09/08/2019   HGBA1C 5.3 09/08/2019    Assessment & Plan:   1. Elevated blood pressure reading in office with diagnosis of hypertension Blood pressures are elevated today. Clonidine 0.1 mg given to patient in office and blood pressures remain elevated. We referred her to ED via ambulance at this time. Patient refused and signed AMA form at discharge. She denies severe headaches,  confusion, seizures, double vision, and blurred vision, nausea and vomiting. She will report to ED if she experiences these symptoms. Patient verbalized understanding.  She will continue to take medications as prescribed, to decrease high sodium intake, excessive alcohol intake, increase potassium intake, smoking cessation, and increase physical activity of at least 30 minutes of cardio activity daily. She will continue to follow Heart Healthy or DASH diet. - cloNIDine (CATAPRES) tablet 0.1 mg  2. Essential hypertension - POC Glucose (CBG) - POC HgB A1c - Urinalysis Dipstick - amLODipine (NORVASC) 10 MG tablet; Take 1 tablet (10 mg total) by mouth daily.  Dispense: 30 tablet; Refill: 11 - cloNIDine (CATAPRES) tablet 0.1 mg - cloNIDine (CATAPRES) tablet 0.1 mg  3. Anxiety - Ambulatory referral to Psychiatry - busPIRone (BUSPAR) 10 MG tablet; Take 1 tablet (10 mg total) by mouth 3 (three) times daily.  Dispense: 90 tablet; Refill: 6 - FLUoxetine (PROZAC) 40 MG capsule; Take 1 capsule (40 mg total) by mouth daily.  Dispense: 30 capsule; Refill: 11  4. Hot flashes Stable. Not worsening.   5. Insomnia, unspecified type - Ambulatory referral to Psychiatry  6. Sciatic pain, right - gabapentin (NEURONTIN) 300 MG capsule; Take 1 capsule (300 mg total) by mouth 4 (four) times daily.  Dispense: 120 capsule; Refill: 11  7. Muscle spas - methocarbamol (ROBAXIN) 500 MG tablet; Take 1 tablet (500 mg total) by mouth 2 (two) times daily as needed for muscle spasms.  Dispense: 60 tablet; Refill: 6  8. Shortness of breath Stable. No signs or symptoms of respiratory distress noted or reported. Monitor. - montelukast (SINGULAIR) 10 MG tablet; Take 1 tablet (10 mg total) by mouth at bedtime.  Dispense: 30 tablet; Refill: 11  9. Cough - montelukast (SINGULAIR) 10 MG tablet; Take 1 tablet (10 mg total) by mouth at bedtime.  Dispense: 30 tablet; Refill: 11 - pantoprazole (PROTONIX) 40 MG tablet; Take 1  tablet (40 mg total) by mouth daily. Take 30-60 min before first meal of the day  Dispense: 30 tablet; Refill: 11 - telmisartan (MICARDIS) 40 MG tablet; Take 1 tablet (40 mg total) by mouth daily.  Dispense: 30 tablet; Refill: 11  10. Chronic obstructive pulmonary disease, unspecified COPD type (HCC) - montelukast (SINGULAIR) 10 MG tablet; Take 1 tablet (10 mg total) by mouth at bedtime.  Dispense: 30 tablet; Refill: 11  11. Weight loss She has a 40 lb weight loss in 7 months.     12. Healthcare maintenance - CBC with Differential - Comprehensive metabolic panel - TSH - Lipid Panel - Vitamin B12 - Vitamin D, 25-hydroxy  13. Follow up She will follow up in 3 months.   Meds ordered this encounter  Medications  . busPIRone (BUSPAR) 10 MG tablet    Sig: Take 1 tablet (10 mg total) by mouth 3 (three) times daily.    Dispense:  90 tablet    Refill:  6  . amLODipine (NORVASC)  10 MG tablet    Sig: Take 1 tablet (10 mg total) by mouth daily.    Dispense:  30 tablet    Refill:  11  . famotidine (PEPCID) 20 MG tablet    Sig: One after supper    Dispense:  30 tablet    Refill:  11  . FLUoxetine (PROZAC) 40 MG capsule    Sig: Take 1 capsule (40 mg total) by mouth daily.    Dispense:  30 capsule    Refill:  11  . gabapentin (NEURONTIN) 300 MG capsule    Sig: Take 1 capsule (300 mg total) by mouth 4 (four) times daily.    Dispense:  120 capsule    Refill:  11  . hydrochlorothiazide (HYDRODIURIL) 25 MG tablet    Sig: Take 1 tablet (25 mg total) by mouth daily.    Dispense:  30 tablet    Refill:  11  . methocarbamol (ROBAXIN) 500 MG tablet    Sig: Take 1 tablet (500 mg total) by mouth 2 (two) times daily as needed for muscle spasms.    Dispense:  60 tablet    Refill:  6  . montelukast (SINGULAIR) 10 MG tablet    Sig: Take 1 tablet (10 mg total) by mouth at bedtime.    Dispense:  30 tablet    Refill:  11  . pantoprazole (PROTONIX) 40 MG tablet    Sig: Take 1 tablet (40 mg total)  by mouth daily. Take 30-60 min before first meal of the day    Dispense:  30 tablet    Refill:  11  . telmisartan (MICARDIS) 40 MG tablet    Sig: Take 1 tablet (40 mg total) by mouth daily.    Dispense:  30 tablet    Refill:  11  . albuterol (PROVENTIL) (2.5 MG/3ML) 0.083% nebulizer solution    Sig: Take 3 mLs (2.5 mg total) by nebulization every 6 (six) hours as needed for wheezing or shortness of breath.    Dispense:  75 mL    Refill:  11  . DISCONTD: cloNIDine (CATAPRES) tablet 0.2 mg  . cloNIDine (CATAPRES) tablet 0.1 mg  . cloNIDine (CATAPRES) tablet 0.1 mg    Orders Placed This Encounter  Procedures  . CBC with Differential  . Comprehensive metabolic panel  . TSH  . Lipid Panel  . Vitamin B12  . Vitamin D, 25-hydroxy  . Ambulatory referral to Psychiatry  . POC Glucose (CBG)  . POC HgB A1c  . Urinalysis Dipstick     Referral Orders     Ambulatory referral to Psychiatry   Raliegh Ip,  MSN, FNP-BC [facility] [address] [place], [place] [address] [phone]   Problem List Items Addressed This Visit      Cardiovascular and Mediastinum   Essential hypertension   Relevant Medications   amLODipine (NORVASC) 10 MG tablet   hydrochlorothiazide (HYDRODIURIL) 25 MG tablet   telmisartan (MICARDIS) 40 MG tablet   cloNIDine (CATAPRES) tablet 0.1 mg (Start on 09/08/2019  5:00 PM)   Other Relevant Orders   POC Glucose (CBG) (Completed)   POC HgB A1c (Completed)   Urinalysis Dipstick (Completed)     Respiratory   Chronic obstructive pulmonary disease (HCC)   Relevant Medications   montelukast (SINGULAIR) 10 MG tablet   albuterol (PROVENTIL) (2.5 MG/3ML) 0.083% nebulizer solution     Nervous and Auditory   Sciatic pain, right  Relevant Medications   busPIRone (BUSPAR) 10 MG tablet   FLUoxetine (PROZAC) 40 MG capsule   gabapentin (NEURONTIN) 300 MG  capsule   methocarbamol (ROBAXIN) 500 MG tablet     Other   Anxiety   Relevant Medications   busPIRone (BUSPAR) 10 MG tablet   FLUoxetine (PROZAC) 40 MG capsule   Other Relevant Orders   Ambulatory referral to Psychiatry   Cough   Relevant Medications   montelukast (SINGULAIR) 10 MG tablet   pantoprazole (PROTONIX) 40 MG tablet   telmisartan (MICARDIS) 40 MG tablet   Muscle spasm   Relevant Medications   methocarbamol (ROBAXIN) 500 MG tablet   Shortness of breath   Relevant Medications   montelukast (SINGULAIR) 10 MG tablet    Other Visit Diagnoses    Elevated blood pressure reading in office with diagnosis of hypertension    -  Primary   Relevant Medications   amLODipine (NORVASC) 10 MG tablet   hydrochlorothiazide (HYDRODIURIL) 25 MG tablet   telmisartan (MICARDIS) 40 MG tablet   cloNIDine (CATAPRES) tablet 0.1 mg (Completed)   cloNIDine (CATAPRES) tablet 0.1 mg (Start on 09/08/2019  5:00 PM)   Hot flashes       Relevant Medications   amLODipine (NORVASC) 10 MG tablet   hydrochlorothiazide (HYDRODIURIL) 25 MG tablet   telmisartan (MICARDIS) 40 MG tablet   cloNIDine (CATAPRES) tablet 0.1 mg (Completed)   cloNIDine (CATAPRES) tablet 0.1 mg (Start on 09/08/2019  5:00 PM)   Insomnia, unspecified type       Relevant Orders   Ambulatory referral to Psychiatry   Weight loss       Healthcare maintenance       Relevant Orders   CBC with Differential   Comprehensive metabolic panel   TSH   Lipid Panel   Vitamin B12   Vitamin D, 25-hydroxy   Follow up          Meds ordered this encounter  Medications  . busPIRone (BUSPAR) 10 MG tablet    Sig: Take 1 tablet (10 mg total) by mouth 3 (three) times daily.    Dispense:  90 tablet    Refill:  6  . amLODipine (NORVASC) 10 MG tablet    Sig: Take 1 tablet (10 mg total) by mouth daily.    Dispense:  30 tablet    Refill:  11  . famotidine (PEPCID) 20 MG tablet    Sig: One after supper    Dispense:  30 tablet    Refill:  11    . FLUoxetine (PROZAC) 40 MG capsule    Sig: Take 1 capsule (40 mg total) by mouth daily.    Dispense:  30 capsule    Refill:  11  . gabapentin (NEURONTIN) 300 MG capsule    Sig: Take 1 capsule (300 mg total) by mouth 4 (four) times daily.    Dispense:  120 capsule    Refill:  11  . hydrochlorothiazide (HYDRODIURIL) 25 MG tablet    Sig: Take 1 tablet (25 mg total) by mouth daily.    Dispense:  30 tablet    Refill:  11  . methocarbamol (ROBAXIN) 500 MG tablet    Sig: Take 1 tablet (500 mg total) by mouth 2 (two) times daily as needed for muscle spasms.    Dispense:  60 tablet    Refill:  6  . montelukast (SINGULAIR) 10 MG tablet    Sig: Take 1 tablet (10 mg total) by mouth at bedtime.  Dispense:  30 tablet    Refill:  11  . pantoprazole (PROTONIX) 40 MG tablet    Sig: Take 1 tablet (40 mg total) by mouth daily. Take 30-60 min before first meal of the day    Dispense:  30 tablet    Refill:  11  . telmisartan (MICARDIS) 40 MG tablet    Sig: Take 1 tablet (40 mg total) by mouth daily.    Dispense:  30 tablet    Refill:  11  . albuterol (PROVENTIL) (2.5 MG/3ML) 0.083% nebulizer solution    Sig: Take 3 mLs (2.5 mg total) by nebulization every 6 (six) hours as needed for wheezing or shortness of breath.    Dispense:  75 mL    Refill:  11  . DISCONTD: cloNIDine (CATAPRES) tablet 0.2 mg  . cloNIDine (CATAPRES) tablet 0.1 mg  . cloNIDine (CATAPRES) tablet 0.1 mg    Follow-up: No follow-ups on file.    Kallie Locks, FNP

## 2019-09-08 NOTE — Patient Instructions (Signed)
Generalized Anxiety Disorder, Adult Generalized anxiety disorder (GAD) is a mental health disorder. People with this condition constantly worry about everyday events. Unlike normal anxiety, worry related to GAD is not triggered by a specific event. These worries also do not fade or get better with time. GAD interferes with life functions, including relationships, work, and school. GAD can vary from mild to severe. People with severe GAD can have intense waves of anxiety with physical symptoms (panic attacks). What are the causes? The exact cause of GAD is not known. What increases the risk? This condition is more likely to develop in:  Women.  People who have a family history of anxiety disorders.  People who are very shy.  People who experience very stressful life events, such as the death of a loved one.  People who have a very stressful family environment. What are the signs or symptoms? People with GAD often worry excessively about many things in their lives, such as their health and family. They may also be overly concerned about:  Doing well at work.  Being on time.  Natural disasters.  Friendships. Physical symptoms of GAD include:  Fatigue.  Muscle tension or having muscle twitches.  Trembling or feeling shaky.  Being easily startled.  Feeling like your heart is pounding or racing.  Feeling out of breath or like you cannot take a deep breath.  Having trouble falling asleep or staying asleep.  Sweating.  Nausea, diarrhea, or irritable bowel syndrome (IBS).  Headaches.  Trouble concentrating or remembering facts.  Restlessness.  Irritability. How is this diagnosed? Your health care provider can diagnose GAD based on your symptoms and medical history. You will also have a physical exam. The health care provider will ask specific questions about your symptoms, including how severe they are, when they started, and if they come and go. Your health care  provider may ask you about your use of alcohol or drugs, including prescription medicines. Your health care provider may refer you to a mental health specialist for further evaluation. Your health care provider will do a thorough examination and may perform additional tests to rule out other possible causes of your symptoms. To be diagnosed with GAD, a person must have anxiety that:  Is out of his or her control.  Affects several different aspects of his or her life, such as work and relationships.  Causes distress that makes him or her unable to take part in normal activities.  Includes at least three physical symptoms of GAD, such as restlessness, fatigue, trouble concentrating, irritability, muscle tension, or sleep problems. Before your health care provider can confirm a diagnosis of GAD, these symptoms must be present more days than they are not, and they must last for six months or longer. How is this treated? The following therapies are usually used to treat GAD:  Medicine. Antidepressant medicine is usually prescribed for long-term daily control. Antianxiety medicines may be added in severe cases, especially when panic attacks occur.  Talk therapy (psychotherapy). Certain types of talk therapy can be helpful in treating GAD by providing support, education, and guidance. Options include: ? Cognitive behavioral therapy (CBT). People learn coping skills and techniques to ease their anxiety. They learn to identify unrealistic or negative thoughts and behaviors and to replace them with positive ones. ? Acceptance and commitment therapy (ACT). This treatment teaches people how to be mindful as a way to cope with unwanted thoughts and feelings. ? Biofeedback. This process trains you to manage your body's response (  physiological response) through breathing techniques and relaxation methods. You will work with a therapist while machines are used to monitor your physical symptoms.  Stress  management techniques. These include yoga, meditation, and exercise. A mental health specialist can help determine which treatment is best for you. Some people see improvement with one type of therapy. However, other people require a combination of therapies. Follow these instructions at home:  Take over-the-counter and prescription medicines only as told by your health care provider.  Try to maintain a normal routine.  Try to anticipate stressful situations and allow extra time to manage them.  Practice any stress management or self-calming techniques as taught by your health care provider.  Do not punish yourself for setbacks or for not making progress.  Try to recognize your accomplishments, even if they are small.  Keep all follow-up visits as told by your health care provider. This is important. Contact a health care provider if:  Your symptoms do not get better.  Your symptoms get worse.  You have signs of depression, such as: ? A persistently sad, cranky, or irritable mood. ? Loss of enjoyment in activities that used to bring you joy. ? Change in weight or eating. ? Changes in sleeping habits. ? Avoiding friends or family members. ? Loss of energy for normal tasks. ? Feelings of guilt or worthlessness. Get help right away if:  You have serious thoughts about hurting yourself or others. If you ever feel like you may hurt yourself or others, or have thoughts about taking your own life, get help right away. You can go to your nearest emergency department or call:  Your local emergency services (911 in the [place]  A suicide crisis helpline, such as the National Suicide Prevention Lifeline at 1-800-273-8255. This is open 24 hours a day. Summary  Generalized anxiety disorder (GAD) is a mental health disorder that involves worry that is not triggered by a specific event.  People with GAD often worry excessively about many things in their lives, such as their health and  family.  GAD may cause physical symptoms such as restlessness, trouble concentrating, sleep problems, frequent sweating, nausea, diarrhea, headaches, and trembling or muscle twitching.  A mental health specialist can help determine which treatment is best for you. Some people see improvement with one type of therapy. However, other people require a combination of therapies. This information is not intended to replace advice given to you by your health care provider. Make sure you discuss any questions you have with your health care provider. Document Revised: 01/05/2017 Document Reviewed: 12/14/2015 Elsevier Patient Education  2020 Elsevier Inc.   Buspirone tablets What is this medicine? BUSPIRONE (byoo SPYE rone) is used to treat anxiety disorders. This medicine may be used for other purposes; ask your health care provider or pharmacist if you have questions. COMMON BRAND NAME(S): BuSpar What should I tell my health care provider before I take this medicine? They need to know if you have any of these conditions:  kidney or liver disease  an unusual or allergic reaction to buspirone, other medicines, foods, dyes, or preservatives  pregnant or trying to get pregnant  breast-feeding How should I use this medicine? Take this medicine by mouth with a glass of water. Follow the directions on the prescription label. You may take this medicine with or without food. To ensure that this medicine always works the same way for you, you should take it either always with or always without food. Take your doses at   regular intervals. Do not take your medicine more often than directed. Do not stop taking except on the advice of your doctor or health care professional. Talk to your pediatrician regarding the use of this medicine in children. Special care may be needed. Overdosage: If you think you have taken too much of this medicine contact a poison control center or emergency room at once. NOTE: This  medicine is only for you. Do not share this medicine with others. What if I miss a dose? If you miss a dose, take it as soon as you can. If it is almost time for your next dose, take only that dose. Do not take double or extra doses. What may interact with this medicine? Do not take this medicine with any of the following medications:  linezolid  MAOIs like Carbex, Eldepryl, Marplan, Nardil, and Parnate  methylene blue  procarbazine This medicine may also interact with the following medications:  diazepam  digoxin  diltiazem  erythromycin  grapefruit juice  haloperidol  medicines for mental depression or mood problems  medicines for seizures like carbamazepine, phenobarbital and phenytoin  nefazodone  other medications for anxiety  rifampin  ritonavir  some antifungal medicines like itraconazole, ketoconazole, and voriconazole  verapamil  warfarin This list may not describe all possible interactions. Give your health care provider a list of all the medicines, herbs, non-prescription drugs, or dietary supplements you use. Also tell them if you smoke, drink alcohol, or use illegal drugs. Some items may interact with your medicine. What should I watch for while using this medicine? Visit your doctor or health care professional for regular checks on your progress. It may take 1 to 2 weeks before your anxiety gets better. You may get drowsy or dizzy. Do not drive, use machinery, or do anything that needs mental alertness until you know how this drug affects you. Do not stand or sit up quickly, especially if you are an older patient. This reduces the risk of dizzy or fainting spells. Alcohol can make you more drowsy and dizzy. Avoid alcoholic drinks. What side effects may I notice from receiving this medicine? Side effects that you should report to your doctor or health care professional as soon as possible:  blurred vision or other vision changes  chest  pain  confusion  difficulty breathing  feelings of hostility or anger  muscle aches and pains  numbness or tingling in hands or feet  ringing in the ears  skin rash and itching  vomiting  weakness Side effects that usually do not require medical attention (report to your doctor or health care professional if they continue or are bothersome):  disturbed dreams, nightmares  headache  nausea  restlessness or nervousness  sore throat and nasal congestion  stomach upset This list may not describe all possible side effects. Call your doctor for medical advice about side effects. You may report side effects to FDA at 1-800-FDA-1088. Where should I keep my medicine? Keep out of the reach of children. Store at room temperature below 30 degrees C (86 degrees F). Protect from light. Keep container tightly closed. Throw away any unused medicine after the expiration date. NOTE: This sheet is a summary. It may not cover all possible information. If you have questions about this medicine, talk to your doctor, pharmacist, or health care provider.  2020 Elsevier/Gold Standard (2009-09-02 18:06:11)  

## 2019-09-10 ENCOUNTER — Other Ambulatory Visit: Payer: Self-pay | Admitting: Family Medicine

## 2019-09-10 ENCOUNTER — Telehealth: Payer: Self-pay | Admitting: Family Medicine

## 2019-09-10 ENCOUNTER — Encounter: Payer: Self-pay | Admitting: Family Medicine

## 2019-09-10 DIAGNOSIS — G4700 Insomnia, unspecified: Secondary | ICD-10-CM

## 2019-09-10 MED ORDER — TRAZODONE HCL 100 MG PO TABS: 100.0000 mg | ORAL_TABLET | Freq: Every day | ORAL | 6 refills | Status: DC

## 2019-09-10 MED FILL — GABAPENTIN 300 MG CAPSULE: 300 | 30 days supply | Qty: 180 | Fill #3

## 2019-09-10 MED FILL — ?PANTOPRAZOLE SODI DR 40MGT: 40 | 30 days supply | Qty: 30 | Fill #4

## 2019-09-11 MED FILL — ?TRAZODONE HCL 100MG TABLE: 100 | 30 days supply | Qty: 30 | Fill #0

## 2019-09-11 NOTE — Telephone Encounter (Signed)
Done

## 2019-09-14 ENCOUNTER — Encounter: Payer: Self-pay | Admitting: Family Medicine

## 2019-09-15 ENCOUNTER — Other Ambulatory Visit: Payer: Self-pay

## 2019-09-15 ENCOUNTER — Other Ambulatory Visit: Payer: Medicaid Other

## 2019-09-15 MED FILL — IBUPROFEN 800 MG TABLET: 800 | 10 days supply | Qty: 30 | Fill #3

## 2019-09-16 ENCOUNTER — Encounter: Payer: Self-pay | Admitting: Family Medicine

## 2019-09-16 LAB — COMPREHENSIVE METABOLIC PANEL
ALT: 23 IU/L (ref 0–32)
AST: 15 IU/L (ref 0–40)
Albumin/Globulin Ratio: 1.3 (ref 1.2–2.2)
Albumin: 4.1 g/dL (ref 3.8–4.8)
Alkaline Phosphatase: 122 IU/L — ABNORMAL HIGH (ref 48–121)
BUN/Creatinine Ratio: 13 (ref 9–23)
BUN: 12 mg/dL (ref 6–24)
Bilirubin Total: 0.4 mg/dL (ref 0.0–1.2)
CO2: 22 mmol/L (ref 20–29)
Calcium: 9.4 mg/dL (ref 8.7–10.2)
Chloride: 101 mmol/L (ref 96–106)
Creatinine, Ser: 0.91 mg/dL (ref 0.57–1.00)
GFR calc Af Amer: 88 mL/min/{1.73_m2} (ref >59)
GFR calc non Af Amer: 76 mL/min/{1.73_m2} (ref >59)
Globulin, Total: 3.2 g/dL (ref 1.5–4.5)
Glucose: 96 mg/dL (ref 65–99)
Potassium: 3.5 mmol/L (ref 3.5–5.2)
Sodium: 140 mmol/L (ref 134–144)
Total Protein: 7.3 g/dL (ref 6.0–8.5)

## 2019-09-16 LAB — LIPID PANEL
Chol/HDL Ratio: 4.4 ratio (ref 0.0–4.4)
Cholesterol, Total: 186 mg/dL (ref 100–199)
HDL: 42 mg/dL (ref >39)
LDL Chol Calc (NIH): 130 mg/dL — ABNORMAL HIGH (ref 0–99)
Triglycerides: 77 mg/dL (ref 0–149)
VLDL Cholesterol Cal: 14 mg/dL (ref 5–40)

## 2019-09-16 LAB — CBC WITH DIFFERENTIAL/PLATELET
Basophils Absolute: 0.1 10*3/uL (ref 0.0–0.2)
Basos: 1 %
EOS (ABSOLUTE): 0.1 10*3/uL (ref 0.0–0.4)
Eos: 2 %
Hematocrit: 44.7 % (ref 34.0–46.6)
Hemoglobin: 15 g/dL (ref 11.1–15.9)
Immature Grans (Abs): 0 10*3/uL (ref 0.0–0.1)
Immature Granulocytes: 0 %
Lymphocytes Absolute: 1.8 10*3/uL (ref 0.7–3.1)
Lymphs: 31 %
MCH: 29.2 pg (ref 26.6–33.0)
MCHC: 33.6 g/dL (ref 31.5–35.7)
MCV: 87 fL (ref 79–97)
Monocytes Absolute: 0.6 10*3/uL (ref 0.1–0.9)
Monocytes: 10 %
Neutrophils Absolute: 3.2 10*3/uL (ref 1.4–7.0)
Neutrophils: 56 %
Platelets: 340 10*3/uL (ref 150–450)
RBC: 5.13 x10E6/uL (ref 3.77–5.28)
RDW: 13.5 % (ref 11.7–15.4)
WBC: 5.9 10*3/uL (ref 3.4–10.8)

## 2019-09-16 LAB — VITAMIN D 25 HYDROXY (VIT D DEFICIENCY, FRACTURES): Vit D, 25-Hydroxy: 40.1 ng/mL (ref 30.0–100.0)

## 2019-09-16 LAB — VITAMIN B12: Vitamin B-12: 216 pg/mL — ABNORMAL LOW (ref 232–1245)

## 2019-09-16 LAB — TSH: TSH: 0.902 u[IU]/mL (ref 0.450–4.500)

## 2019-09-23 NOTE — Progress Notes (Signed)
Pt was called @ 1:51 pm to discuss her lab results. Pt stated she understood her labs and I transferred her to make a 3 month f/u.

## 2019-09-29 ENCOUNTER — Ambulatory Visit (HOSPITAL_COMMUNITY): Payer: No Payment, Other | Admitting: Licensed Clinical Social Worker

## 2019-11-03 MED FILL — FLUoxetine HCL 40 MG CAPS: 40 | 30 days supply | Qty: 30 | Fill #1

## 2019-11-03 MED FILL — AMLODIPINE BESYLATE 10 MG T: 10 | 30 days supply | Qty: 30 | Fill #1

## 2019-11-03 MED FILL — HYDROCHLOROTHIAZIDE 25 MG T: 25 | 30 days supply | Qty: 30 | Fill #1

## 2019-11-03 MED FILL — PANTOPRAZOLE SOD DR 40 MG T: 40 | 30 days supply | Qty: 30 | Fill #1

## 2019-11-03 MED FILL — ?TRAZODONE HCL 100MG TABLE: 100 | 30 days supply | Qty: 30 | Fill #0

## 2019-11-03 MED FILL — METHOCARBAMOL 500 MG TABS: 500 | 30 days supply | Qty: 60 | Fill #1

## 2019-11-03 MED FILL — FAMOTIDINE 20 MG TABS: 20 | 30 days supply | Qty: 30 | Fill #1

## 2019-11-03 MED FILL — ALBUTEROL SUL 2.5 MG/3 ML S: (2.5 MG/3ML | 6 days supply | Qty: 75 | Fill #1

## 2019-11-03 MED FILL — MONTELUKAST SOD 10 MG TAB: 10 | 30 days supply | Qty: 30 | Fill #1

## 2019-11-03 MED FILL — ?BUSPIRONE HCL 10 MG TABLET: 10 | 30 days supply | Qty: 90 | Fill #1

## 2019-11-03 MED FILL — TELMISARTAN 40 MG TABS: 40 | 30 days supply | Qty: 30 | Fill #1

## 2019-11-07 ENCOUNTER — Other Ambulatory Visit: Payer: Self-pay | Admitting: Family Medicine

## 2019-11-07 ENCOUNTER — Ambulatory Visit (HOSPITAL_COMMUNITY): Payer: No Payment, Other | Admitting: Licensed Clinical Social Worker

## 2019-11-07 DIAGNOSIS — M5431 Sciatica, right side: Secondary | ICD-10-CM

## 2019-11-07 MED FILL — IBUPROFEN 800 MG TABLET: 800 | 10 days supply | Qty: 30 | Fill #3

## 2019-11-10 ENCOUNTER — Ambulatory Visit (HOSPITAL_COMMUNITY): Payer: No Payment, Other | Admitting: Psychiatry

## 2019-11-24 ENCOUNTER — Other Ambulatory Visit: Payer: Self-pay | Admitting: Family Medicine

## 2019-11-24 DIAGNOSIS — Z1231 Encounter for screening mammogram for malignant neoplasm of breast: Secondary | ICD-10-CM

## 2019-11-25 ENCOUNTER — Telehealth (INDEPENDENT_AMBULATORY_CARE_PROVIDER_SITE_OTHER): Payer: Self-pay | Admitting: Family Medicine

## 2019-11-25 DIAGNOSIS — S66911A Strain of unspecified muscle, fascia and tendon at wrist and hand level, right hand, initial encounter: Secondary | ICD-10-CM

## 2019-11-25 DIAGNOSIS — F419 Anxiety disorder, unspecified: Secondary | ICD-10-CM

## 2019-11-25 DIAGNOSIS — M5431 Sciatica, right side: Secondary | ICD-10-CM

## 2019-11-25 DIAGNOSIS — I10 Essential (primary) hypertension: Secondary | ICD-10-CM

## 2019-11-25 DIAGNOSIS — Z09 Encounter for follow-up examination after completed treatment for conditions other than malignant neoplasm: Secondary | ICD-10-CM

## 2019-11-25 MED ORDER — IBUPROFEN 800 MG PO TABS: 800.0000 mg | ORAL_TABLET | Freq: Three times a day (TID) | ORAL | 6 refills | Status: DC | PRN

## 2019-11-25 MED ORDER — GABAPENTIN 300 MG PO CAPS: 300.0000 mg | ORAL_CAPSULE | Freq: Four times a day (QID) | ORAL | 6 refills | Status: DC

## 2019-11-25 NOTE — Progress Notes (Signed)
Virtual Visit via Telephone Note  I connected with Catherine Kane on 11/26/19 at  3:00 PM EDT by telephone and verified that I am speaking with the correct person using two identifiers.  Location: Patient: Home Provider: Office  I discussed the limitations, risks, security and privacy concerns of performing an evaluation and management service by telephone and the availability of in person appointments. I also discussed with the patient that there may be a patient responsible charge related to this service. The patient expressed understanding and agreed to proceed.   History of Present Illness:  Patient Active Problem List   Diagnosis Date Noted  . Anxiety 01/11/2019  . Well woman exam with routine gynecological exam 11/26/2018  . Breast lump on left side at 12 o'clock position 11/26/2018  . Chronic obstructive pulmonary disease (HCC) 10/01/2018  . Muscle spasm 10/01/2018  . Sciatic pain, right 10/01/2018  . Essential hypertension 06/25/2018  . Chest congestion 05/23/2018  . Shortness of breath 05/03/2018  . Cough 05/03/2018    Current Status: Since her last office visit, she c/o right arm swelling and pain. She also has occasional numbness/tingling in arm from her had to elbow, which often shoots up to her shoulder. She is currently following up with Psychiatry at [facility] as needed, and has scheduled for Mammogram 01/2020. She states that her blood pressure readings continue to be elevated at home, but she still continues to take medications as prescribed. She denies visual changes, chest pain, cough, shortness of breath, heart palpitations, and falls. She has occasional headaches and dizziness with position changes. Denies severe headaches, confusion, seizures, double vision, and blurred vision, nausea and vomiting. She denies fevers, chills, fatigue, recent infections, weight loss, and night sweats. Denies GI problems such as diarrhea, and constipation. She  has no reports of blood in stools, dysuria and hematuria. No depression or anxiety reported today. She is taking all medications as prescribed.     Observations/Objective:  Telephone Visit   Assessment and Plan:  1. Wrist strain, right, initial encounter We will send refills of Motrin and Gabapentin today. She will adapt to RICE methods for aid in her pain. Monitor.   2. Sciatic pain, right - gabapentin (NEURONTIN) 300 MG capsule; Take 1 capsule (300 mg total) by mouth 4 (four) times daily.  Dispense: 120 capsule; Refill: 6 - ibuprofen (ADVIL) 800 MG tablet; Take 1 tablet (800 mg total) by mouth every 8 (eight) hours as needed for mild pain or moderate pain.  Dispense: 30 tablet; Refill: 6  3. Anxiety  4. Elevated blood pressure reading with diagnosis of hypertension Patient states that her home blood pressure readings have been elevated lately. She will decrease stress and continue to monitor as needed.   5. Essential hypertension She will continue to take medications as prescribed, to decrease high sodium intake, excessive alcohol intake, increase potassium intake, smoking cessation, and increase physical activity of at least 30 minutes of cardio activity daily. She will continue to follow Heart Healthy or DASH diet.  6. Follow up She will keep follow up appointment as scheduled.   Meds ordered this encounter  Medications  . gabapentin (NEURONTIN) 300 MG capsule    Sig: Take 1 capsule (300 mg total) by mouth 4 (four) times daily.    Dispense:  120 capsule    Refill:  6  . ibuprofen (ADVIL) 800 MG tablet    Sig: Take 1 tablet (800 mg total) by mouth every 8 (eight) hours as needed  for mild pain or moderate pain.    Dispense:  30 tablet    Refill:  6    No orders of the defined types were placed in this encounter.   Referral Orders  No referral(s) requested today    Raliegh Ip,  MSN, FNP-BC [facility] Center/Internal Medicine/Sickle Cell Center [facility] [address] [place], [place] [address] [phone] fax   I discussed the assessment and treatment plan with the patient. The patient was provided an opportunity to ask questions and all were answered. The patient agreed with the plan and demonstrated an understanding of the instructions.   The patient was advised to call back or seek an in-person evaluation if the symptoms worsen or if the condition fails to improve as anticipated.  I provided 20 minutes of non-face-to-face time during this encounter.   Kallie Locks, FNP

## 2019-11-26 ENCOUNTER — Encounter: Payer: Self-pay | Admitting: Family Medicine

## 2019-11-27 ENCOUNTER — Telehealth: Payer: Self-pay | Admitting: Family Medicine

## 2019-12-11 NOTE — Telephone Encounter (Signed)
error 

## 2019-12-24 ENCOUNTER — Ambulatory Visit: Payer: Medicaid Other

## 2019-12-30 ENCOUNTER — Ambulatory Visit: Payer: Self-pay | Admitting: Family Medicine

## 2020-01-08 ENCOUNTER — Ambulatory Visit (HOSPITAL_COMMUNITY): Payer: No Payment, Other | Admitting: Licensed Clinical Social Worker

## 2020-01-08 ENCOUNTER — Ambulatory Visit (HOSPITAL_COMMUNITY): Payer: No Payment, Other | Admitting: Physician Assistant

## 2020-01-21 ENCOUNTER — Other Ambulatory Visit: Payer: Self-pay | Admitting: Family Medicine

## 2020-01-21 DIAGNOSIS — M5431 Sciatica, right side: Secondary | ICD-10-CM

## 2020-01-21 MED FILL — GABAPENTIN 300 MG CAPSULE: 300 | 30 days supply | Qty: 180 | Fill #0

## 2020-01-21 MED FILL — FAMOTIDINE 20 MG TABS: 20 | 30 days supply | Qty: 30 | Fill #2

## 2020-01-21 MED FILL — TRAZODONE HCL 100 MG TABS: 100 | 30 days supply | Qty: 30 | Fill #1

## 2020-01-21 MED FILL — METHOCARBAMOL 500 MG TABS: 500 | 30 days supply | Qty: 60 | Fill #2

## 2020-01-21 MED FILL — PANTOPRAZOLE SOD DR 40 MG T: 40 | 30 days supply | Qty: 30 | Fill #2

## 2020-01-21 MED FILL — HYDROCHLOROTHIAZIDE 25 MG T: 25 | 30 days supply | Qty: 30 | Fill #2

## 2020-01-21 MED FILL — IBUPROFEN 800 MG TABLET: 800 | 10 days supply | Qty: 30 | Fill #0

## 2020-01-21 NOTE — Telephone Encounter (Signed)
Is this okay to refill? 

## 2020-02-16 ENCOUNTER — Ambulatory Visit: Payer: Medicaid Other

## 2020-03-02 ENCOUNTER — Encounter: Payer: Self-pay | Admitting: Family Medicine

## 2020-03-02 ENCOUNTER — Telehealth (INDEPENDENT_AMBULATORY_CARE_PROVIDER_SITE_OTHER): Payer: BC Managed Care – PPO | Admitting: Family Medicine

## 2020-03-02 DIAGNOSIS — Z09 Encounter for follow-up examination after completed treatment for conditions other than malignant neoplasm: Secondary | ICD-10-CM

## 2020-03-02 DIAGNOSIS — R059 Cough, unspecified: Secondary | ICD-10-CM | POA: Diagnosis not present

## 2020-03-02 DIAGNOSIS — I10 Essential (primary) hypertension: Secondary | ICD-10-CM

## 2020-03-02 DIAGNOSIS — B349 Viral infection, unspecified: Secondary | ICD-10-CM | POA: Diagnosis not present

## 2020-03-02 MED FILL — ?TELMISARTAN 40MG TABL: 40 | 30 days supply | Qty: 30 | Fill #2

## 2020-03-02 MED FILL — FLUoxetine HCL 40 MG CAPS: 40 | 30 days supply | Qty: 30 | Fill #2

## 2020-03-02 MED FILL — IBUPROFEN 800 MG TABLET: 800 | 10 days supply | Qty: 30 | Fill #0

## 2020-03-02 MED FILL — GABAPENTIN 300 MG CAPSULE: 300 | 30 days supply | Qty: 180 | Fill #0

## 2020-03-02 MED FILL — PANTOPRAZOLE SOD DR 40 MG T: 40 | 30 days supply | Qty: 30 | Fill #3

## 2020-03-02 MED FILL — METHOCARBAMOL 500 MG TABS: 500 | 30 days supply | Qty: 60 | Fill #3

## 2020-03-02 MED FILL — AMLODIPINE BESYLATE 10 MG T: 10 | 30 days supply | Qty: 30 | Fill #2

## 2020-03-02 MED FILL — ?BUSPIRONE HCL 10 MG TABLET: 10 | 30 days supply | Qty: 90 | Fill #2

## 2020-03-02 MED FILL — TRAZODONE HCL 100 MG TABS: 100 | 30 days supply | Qty: 30 | Fill #2

## 2020-03-02 MED FILL — HYDROCHLOROTHIAZIDE 25 MG T: 25 | 30 days supply | Qty: 30 | Fill #3

## 2020-03-02 MED FILL — MONTELUKAST SOD 10 MG TAB: 10 | 30 days supply | Qty: 30 | Fill #2

## 2020-03-02 MED FILL — ?FAMOTIDINE 20MG TABLET: 20 | 30 days supply | Qty: 30 | Fill #3

## 2020-03-02 NOTE — Progress Notes (Signed)
Virtual Visit via Telephone Note  I connected with Catherine Kane on 03/02/20 at 11:20 AM EST by telephone and verified that I am speaking with the correct person using two identifiers.  Location: Patient: Home Provider: Office   I discussed the limitations, risks, security and privacy concerns of performing an evaluation and management service by telephone and the availability of in person appointments. I also discussed with the patient that there may be a patient responsible charge related to this service. The patient expressed understanding and agreed to proceed.   History of Present Illness:  Patient Active Problem List   Diagnosis Date Noted  . Anxiety 01/11/2019  . Well woman exam with routine gynecological exam 11/26/2018  . Breast lump on left side at 12 o'clock position 11/26/2018  . Chronic obstructive pulmonary disease (HCC) 10/01/2018  . Muscle spasm 10/01/2018  . Sciatic pain, right 10/01/2018  . Essential hypertension 06/25/2018  . Chest congestion 05/23/2018  . Shortness of breath 05/03/2018  . Cough 05/03/2018   Current Status: Since her last office visit, she has c/o cough and cold symptoms, which she is taking OTC. She denies visual changes, chest pain, cough, shortness of breath, heart palpitations, and falls. She has occasional headaches and dizziness with position changes. Denies severe headaches, confusion, seizures, double vision, and blurred vision, nausea and vomiting. She denies fevers, chills, fatigue, recent infections, weight loss, and night sweats. She has not had any headaches, visual changes, dizziness, and falls. No chest pain, heart palpitations, cough and shortness of breath reported. Denies GI problems such as nausea, vomiting, diarrhea, and constipation. She has no reports of blood in stools, dysuria and hematuria. No depression or anxiety, and denies suicidal ideations, homicidal ideations, or auditory hallucinations. She is taking all  medications as prescribed. She denies pain today.     Observations/Objective:  Telephone Virtual Visit   Assessment and Plan:  1. Essential hypertension She will continue to take medications as prescribed, to decrease high sodium intake, excessive alcohol intake, increase potassium intake, smoking cessation, and increase physical activity of at least 30 minutes of cardio activity daily. She will continue to follow Heart Healthy or DASH diet.  2. Viral infection She will get tested for Covid today.   3. Cough Continue OTC cough regimen.   4. Follow up She will contact office for follow up appointment.   Current Outpatient Medications on File Prior to Visit  Medication Sig Dispense Refill  . albuterol (PROVENTIL) (2.5 MG/3ML) 0.083% nebulizer solution Take 3 mLs (2.5 mg total) by nebulization every 6 (six) hours as needed for wheezing or shortness of breath. (Patient not taking: Reported on 11/25/2019) 75 mL 11  . amLODipine (NORVASC) 10 MG tablet Take 1 tablet (10 mg total) by mouth daily. 30 tablet 11  . aspirin EC 81 MG tablet Take 81 mg by mouth daily.    . busPIRone (BUSPAR) 10 MG tablet Take 1 tablet (10 mg total) by mouth 3 (three) times daily. 90 tablet 6  . chlorpheniramine (CHLOR-TRIMETON) 4 MG tablet Take 4 mg by mouth every 4 (four) hours as needed for allergies. (Patient not taking: Reported on 11/25/2019)    . dextromethorphan (DELSYM) 30 MG/5ML liquid Take 15 mg by mouth as needed for cough.    . ergocalciferol (VITAMIN D2) 1.25 MG (50000 UT) capsule TAKE 1 CAPSULE (50,000 UNITS TOTAL) BY MOUTH EVERY 7 (SEVEN) DAYS. 11 capsule 6  . famotidine (PEPCID) 20 MG tablet One after supper 30 tablet 11  . Ferrous  Sulfate (IRON) 325 (65 Fe) MG TABS Take 1 tablet by mouth daily.    Marland Kitchen FLUoxetine (PROZAC) 40 MG capsule Take 1 capsule (40 mg total) by mouth daily. 30 capsule 11  . gabapentin (NEURONTIN) 300 MG capsule TAKE 2 CAPSULES (600 MG) 3 TIMES A DAY. 180 capsule 3  .  hydrochlorothiazide (HYDRODIURIL) 25 MG tablet Take 1 tablet (25 mg total) by mouth daily. 30 tablet 11  . ibuprofen (ADVIL) 800 MG tablet TAKE 1 TABLET (800 MG TOTAL) BY MOUTH EVERY 8 (EIGHT) HOURS AS NEEDED FOR MILD PAIN OR MODERATE PAIN. 30 tablet 3  . methocarbamol (ROBAXIN) 500 MG tablet Take 1 tablet (500 mg total) by mouth 2 (two) times daily as needed for muscle spasms. 60 tablet 6  . montelukast (SINGULAIR) 10 MG tablet Take 1 tablet (10 mg total) by mouth at bedtime. 30 tablet 11  . pantoprazole (PROTONIX) 40 MG tablet Take 1 tablet (40 mg total) by mouth daily. Take 30-60 min before first meal of the day 30 tablet 11  . Respiratory Therapy Supplies (FLUTTER) DEVI Use as directed 1 each 0  . telmisartan (MICARDIS) 40 MG tablet Take 1 tablet (40 mg total) by mouth daily. 30 tablet 11  . traMADol (ULTRAM) 50 MG tablet TAKE 1 TABLET (50 MG TOTAL) BY MOUTH EVERY 4 (FOUR) HOURS AS NEEDED. (Patient not taking: Reported on 11/25/2019) 40 tablet 0  . traZODone (DESYREL) 100 MG tablet Take 1 tablet (100 mg total) by mouth at bedtime. 30 tablet 6  . valACYclovir (VALTREX) 500 MG tablet Take 500 mg by mouth 2 (two) times daily. (Patient not taking: Reported on 11/25/2019)     No current facility-administered medications on file prior to visit.    No orders of the defined types were placed in this encounter.   Referral Orders  No referral(s) requested today   Raliegh Ip, MSN, ANE, FNP-BC [facility] Patient Care Center/Internal Medicine/Sickle Cell Center [facility] [address] [place], [place] [address] [phone] fax   I discussed the assessment and treatment plan with the patient. The patient was provided an opportunity to ask questions and all were answered. The patient agreed with the plan and demonstrated an understanding of the instructions.   The patient was advised to call back or seek an in-person evaluation if the symptoms worsen or if  the condition fails to improve as anticipated.  I provided 20 minutes of non-face-to-face time during this encounter.   Kallie Locks, FNP

## 2020-03-26 ENCOUNTER — Ambulatory Visit: Payer: Medicaid Other

## 2020-05-14 ENCOUNTER — Other Ambulatory Visit: Payer: Self-pay

## 2020-05-14 MED FILL — Fluoxetine HCl Cap 40 MG: ORAL | 30 days supply | Qty: 30 | Fill #0 | Status: CN

## 2020-05-14 MED FILL — Ibuprofen Tab 800 MG: ORAL | 30 days supply | Qty: 30 | Fill #0 | Status: CN

## 2020-05-14 MED FILL — Trazodone HCl Tab 100 MG: ORAL | 30 days supply | Qty: 30 | Fill #0 | Status: CN

## 2020-05-14 MED FILL — Montelukast Sodium Tab 10 MG (Base Equiv): ORAL | 30 days supply | Qty: 30 | Fill #0 | Status: CN

## 2020-05-24 ENCOUNTER — Other Ambulatory Visit: Payer: Self-pay

## 2020-08-12 ENCOUNTER — Ambulatory Visit: Payer: Self-pay | Admitting: Nurse Practitioner

## 2020-12-18 IMAGING — MG DIGITAL DIAGNOSTIC BILAT W/ TOMO W/ CAD
6 of 10 series · 6 of 30 positions shown · non-contrast
Comparison: None.

ACR Breast Density Category a: The breast tissue is almost entirely
fatty.

CLINICAL DATA: 47-year-old female presenting for evaluation of
focal pain and a palpable lump in the upper-outer left breast.Family
history of breast cancer in a maternal aunt and maternal
grandmother.

EXAM:
DIGITAL DIAGNOSTIC BILATERAL MAMMOGRAM WITH CAD AND TOMO
ULTRASOUND LEFT BREAST

[L ML synth-2D]
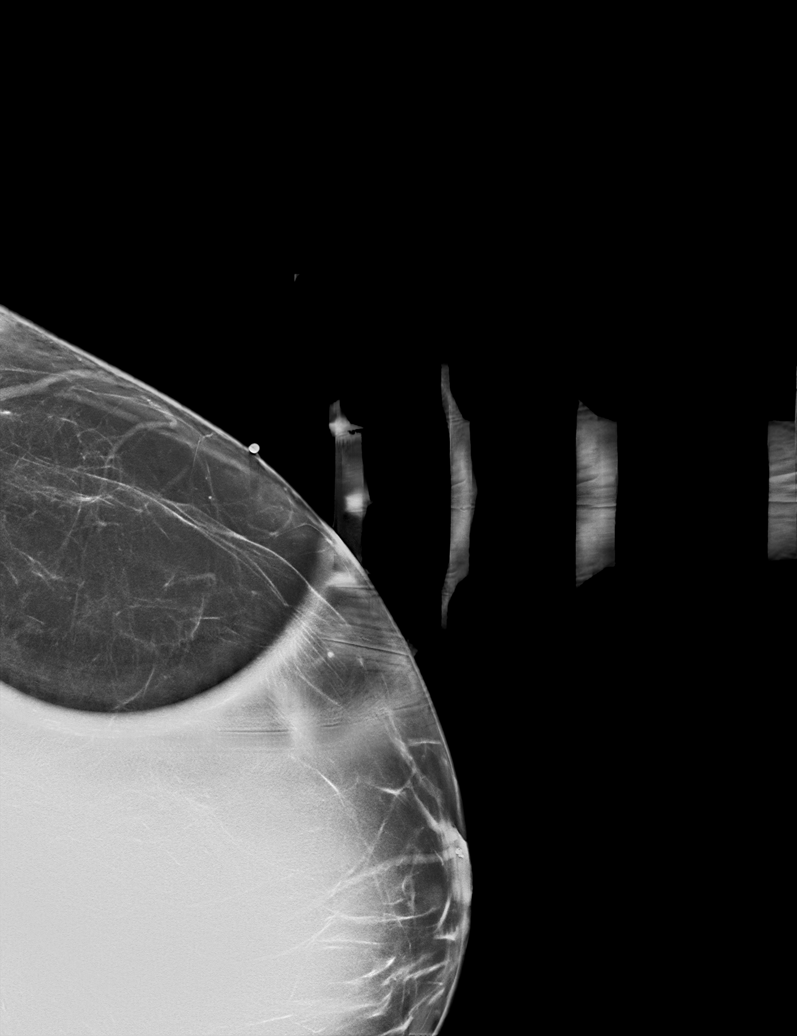

[R MLO synth-2D]
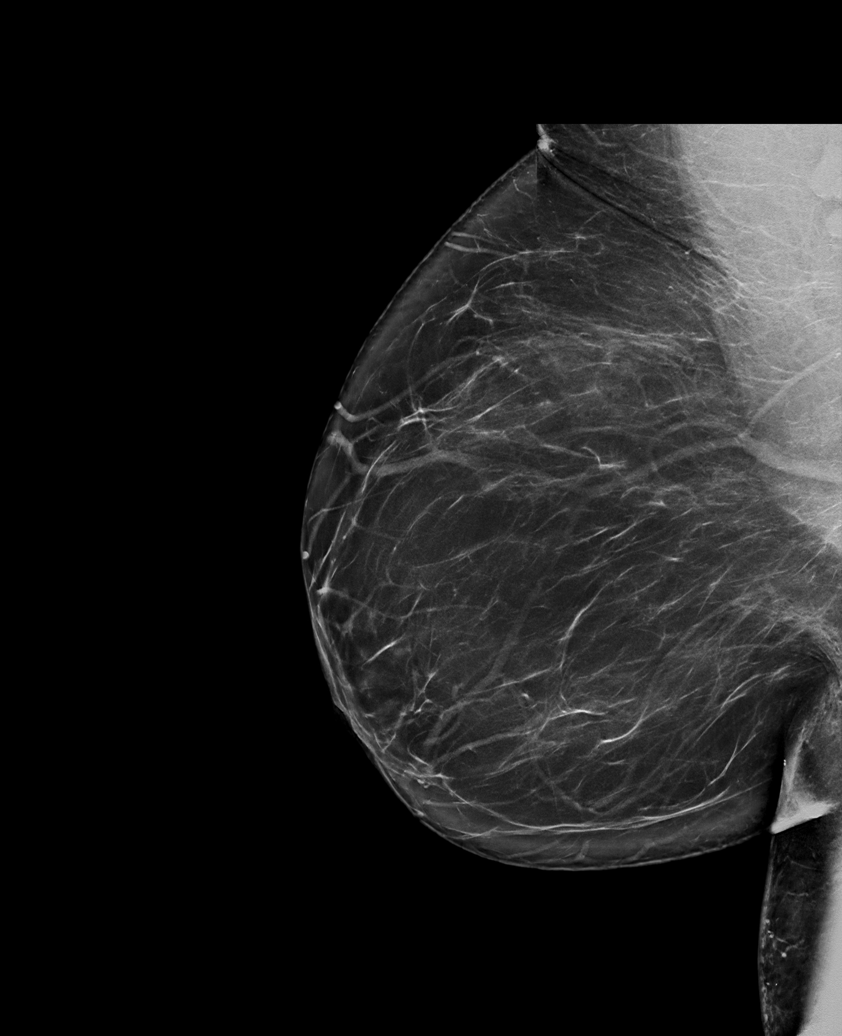

[R CC synth-2D]
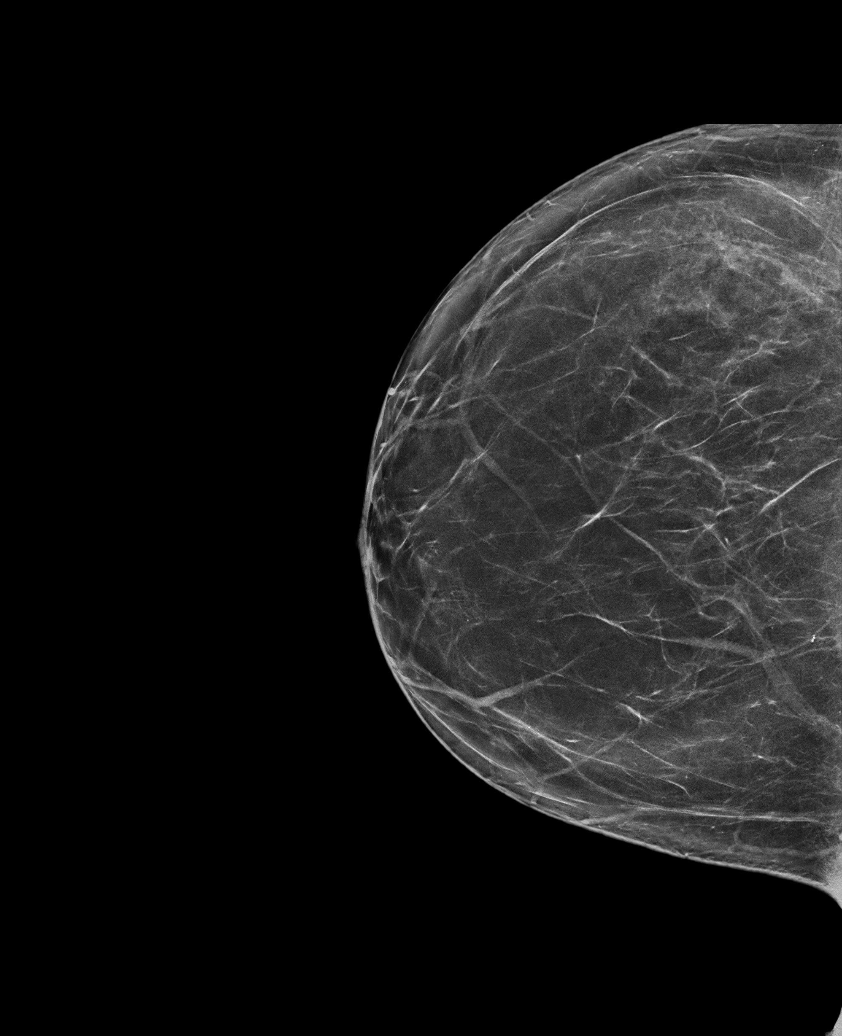

[L MLO synth-2D]
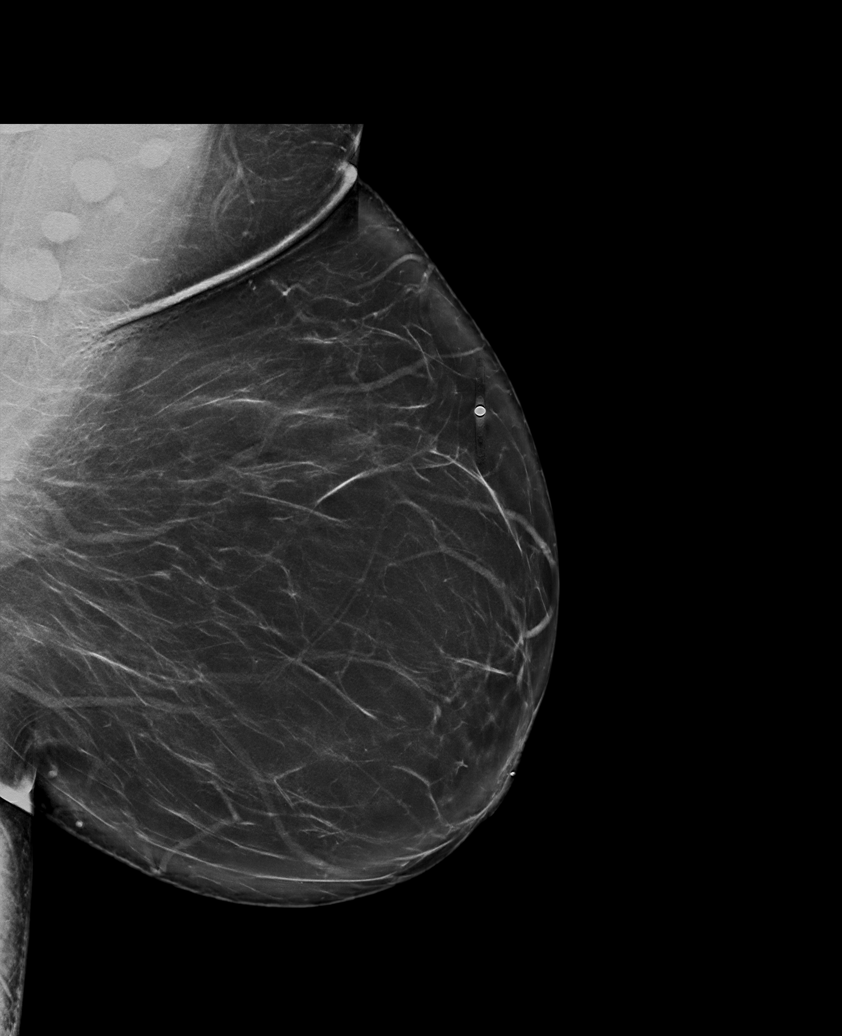

[L CC synth-2D]
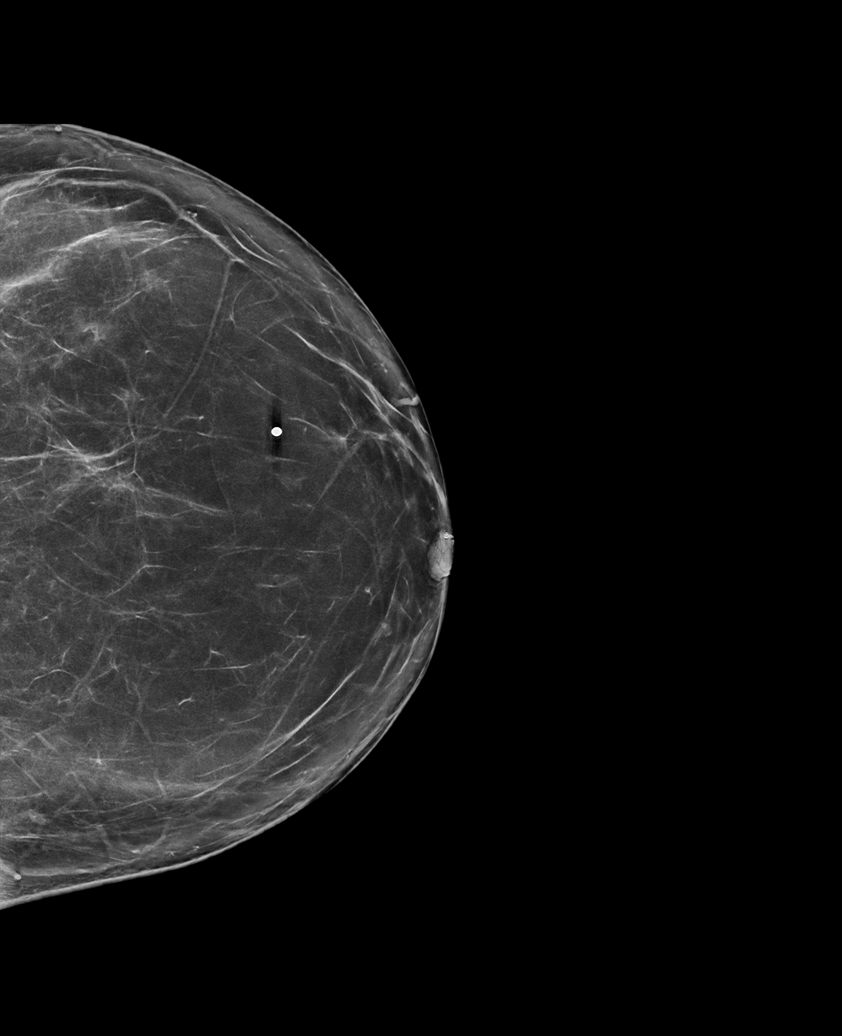

[L MLO tomo · tomo slice 49/98.0]
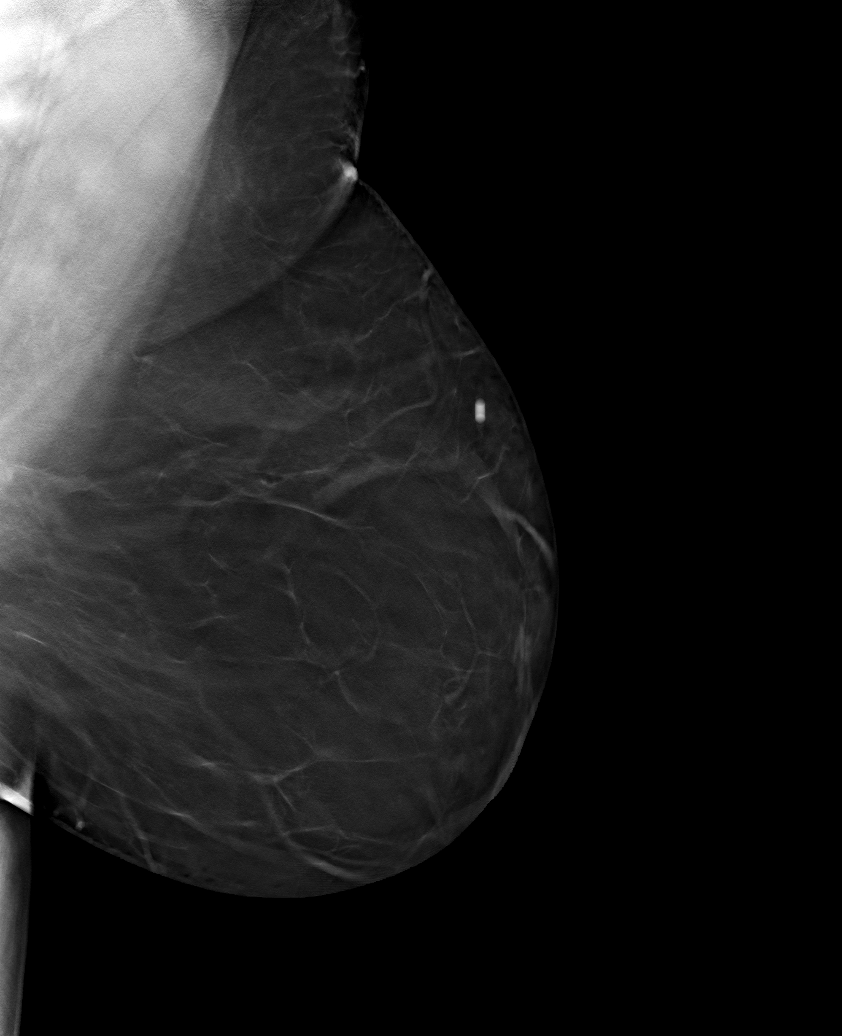

[6 of 30 positions shown; findings below may reference images not displayed]

FINDINGS: A BB has been placed along the upper outer aspect of the left breast
indicating the tender palpable site of concern. There are no
suspicious mammographic findings deep to the marker. No suspicious
calcifications, masses or areas of distortion are seen in the
bilateral breasts.

Mammographic images were processed with CAD.

Ultrasound targeted to the tender palpable site in the left breast
at 12 o'clock, 7 cm from the nipple demonstrates a slightly
echogenic oval circumscribed mass measuring 3.6 x 1.4 x 2.1 cm.
IMPRESSION: 1. The tender palpable lump in the left breast at 12 o'clock
corresponds with a benign lipoma.

2.  No mammographic evidence of malignancy in the bilateral breasts.

RECOMMENDATION:
Screening mammogram in one year.(Code:PI-D-L64)

I have discussed the findings and recommendations with the patient.
If applicable, a reminder letter will be sent to the patient
regarding the next appointment.

BI-RADS CATEGORY  2: Benign.

## 2021-03-25 ENCOUNTER — Ambulatory Visit: Payer: Self-pay | Admitting: Internal Medicine

## 2021-05-30 ENCOUNTER — Ambulatory Visit: Payer: Self-pay | Admitting: Nurse Practitioner

## 2021-06-02 ENCOUNTER — Ambulatory Visit: Payer: Self-pay | Admitting: Nurse Practitioner

## 2021-07-05 ENCOUNTER — Ambulatory Visit: Payer: Self-pay | Admitting: Internal Medicine

## 2021-11-01 ENCOUNTER — Other Ambulatory Visit (HOSPITAL_COMMUNITY): Payer: Self-pay

## 2021-11-01 ENCOUNTER — Encounter: Payer: Self-pay | Admitting: Critical Care Medicine

## 2021-11-01 ENCOUNTER — Ambulatory Visit: Payer: Self-pay | Attending: Internal Medicine | Admitting: Critical Care Medicine

## 2021-11-01 VITALS — BP 174/128 | HR 67 | Ht 63.0 in | Wt 188.0 lb

## 2021-11-01 DIAGNOSIS — E559 Vitamin D deficiency, unspecified: Secondary | ICD-10-CM

## 2021-11-01 DIAGNOSIS — F419 Anxiety disorder, unspecified: Secondary | ICD-10-CM

## 2021-11-01 DIAGNOSIS — R059 Cough, unspecified: Secondary | ICD-10-CM

## 2021-11-01 DIAGNOSIS — G4700 Insomnia, unspecified: Secondary | ICD-10-CM

## 2021-11-01 DIAGNOSIS — Z23 Encounter for immunization: Secondary | ICD-10-CM

## 2021-11-01 DIAGNOSIS — J449 Chronic obstructive pulmonary disease, unspecified: Secondary | ICD-10-CM

## 2021-11-01 DIAGNOSIS — R0602 Shortness of breath: Secondary | ICD-10-CM

## 2021-11-01 DIAGNOSIS — I10 Essential (primary) hypertension: Secondary | ICD-10-CM

## 2021-11-01 DIAGNOSIS — N951 Menopausal and female climacteric states: Secondary | ICD-10-CM

## 2021-11-01 DIAGNOSIS — Z139 Encounter for screening, unspecified: Secondary | ICD-10-CM

## 2021-11-01 DIAGNOSIS — Z01419 Encounter for gynecological examination (general) (routine) without abnormal findings: Secondary | ICD-10-CM

## 2021-11-01 DIAGNOSIS — J411 Mucopurulent chronic bronchitis: Secondary | ICD-10-CM

## 2021-11-01 MED ORDER — PANTOPRAZOLE SODIUM 40 MG PO TBEC
40.0000 mg | DELAYED_RELEASE_TABLET | Freq: Every day | ORAL | 11 refills | Status: DC
  Filled 2021-11-01 – 2021-12-16 (×2): qty 30, 30d supply, fill #0
  Filled 2022-01-26 – 2022-03-20 (×3): qty 30, 30d supply, fill #1
  Filled 2022-04-24: qty 30, 30d supply, fill #2
  Filled 2022-05-19: qty 30, 30d supply, fill #0
  Filled 2022-05-19: qty 30, 30d supply, fill #3
  Filled 2022-06-17 – 2022-08-09 (×3): qty 30, 30d supply, fill #1
  Filled 2022-09-04: qty 30, 30d supply, fill #2
  Filled 2022-10-02: qty 30, 30d supply, fill #3

## 2021-11-01 MED ORDER — ERGOCALCIFEROL 1.25 MG (50000 UT) PO CAPS
1.0000 | ORAL_CAPSULE | ORAL | 6 refills | Status: DC
  Filled 2021-11-01: qty 11, 77d supply, fill #0

## 2021-11-01 MED ORDER — TRAZODONE HCL 100 MG PO TABS
100.0000 mg | ORAL_TABLET | Freq: Every day | ORAL | 6 refills | Status: DC
  Filled 2021-11-01 – 2021-12-16 (×2): qty 30, 30d supply, fill #0
  Filled 2022-01-26 – 2022-03-20 (×3): qty 30, 30d supply, fill #1
  Filled 2022-04-24: qty 30, 30d supply, fill #2
  Filled 2022-05-19: qty 30, 30d supply, fill #3
  Filled 2022-05-19: qty 30, 30d supply, fill #0
  Filled 2022-06-17 – 2022-08-09 (×3): qty 30, 30d supply, fill #1
  Filled 2022-09-04: qty 30, 30d supply, fill #2

## 2021-11-01 MED ORDER — ALBUTEROL SULFATE HFA 108 (90 BASE) MCG/ACT IN AERS
2.0000 | INHALATION_SPRAY | Freq: Four times a day (QID) | RESPIRATORY_TRACT | 0 refills | Status: DC | PRN
  Filled 2021-11-01: qty 6.7, fill #0

## 2021-11-01 MED ORDER — MONTELUKAST SODIUM 10 MG PO TABS
ORAL_TABLET | Freq: Every day | ORAL | 11 refills | Status: DC
Start: 2021-11-01 — End: 2021-11-01
  Filled 2021-11-01: qty 30, fill #0

## 2021-11-01 MED ORDER — ALBUTEROL SULFATE HFA 108 (90 BASE) MCG/ACT IN AERS
2.0000 | INHALATION_SPRAY | Freq: Four times a day (QID) | RESPIRATORY_TRACT | 0 refills | Status: DC | PRN
  Filled 2021-11-01: qty 6.7, 25d supply, fill #0

## 2021-11-01 MED ORDER — ERGOCALCIFEROL 1.25 MG (50000 UT) PO CAPS
1.0000 | ORAL_CAPSULE | ORAL | 6 refills | Status: DC
  Filled 2021-11-01 – 2021-12-16 (×2): qty 4, 28d supply, fill #0
  Filled 2022-01-26 – 2022-03-20 (×3): qty 4, 28d supply, fill #1
  Filled 2022-04-24: qty 4, 28d supply, fill #2
  Filled 2022-05-19 – 2022-08-09 (×5): qty 4, 28d supply, fill #3
  Filled 2022-09-04: qty 4, 28d supply, fill #4
  Filled 2022-10-02: qty 4, 28d supply, fill #0

## 2021-11-01 MED ORDER — AMLODIPINE BESYLATE 10 MG PO TABS
10.0000 mg | ORAL_TABLET | Freq: Every day | ORAL | 11 refills | Status: DC
  Filled 2021-11-01 – 2021-12-16 (×2): qty 30, 30d supply, fill #0
  Filled 2022-01-26 – 2022-03-20 (×3): qty 30, 30d supply, fill #1
  Filled 2022-04-24: qty 30, 30d supply, fill #2
  Filled 2022-05-19: qty 30, 30d supply, fill #3
  Filled 2022-05-19: qty 30, 30d supply, fill #0
  Filled 2022-06-17 – 2022-08-09 (×4): qty 30, 30d supply, fill #1
  Filled 2022-09-04: qty 30, 30d supply, fill #2
  Filled 2022-10-02: qty 30, 30d supply, fill #3

## 2021-11-01 MED ORDER — VALSARTAN-HYDROCHLOROTHIAZIDE 320-25 MG PO TABS
1.0000 | ORAL_TABLET | Freq: Every day | ORAL | 3 refills | Status: DC
  Filled 2021-11-01 – 2021-12-16 (×2): qty 30, 30d supply, fill #0
  Filled 2022-01-26 – 2022-03-20 (×3): qty 30, 30d supply, fill #1
  Filled 2022-04-24: qty 30, 30d supply, fill #2
  Filled 2022-05-19: qty 30, 30d supply, fill #3
  Filled 2022-05-19: qty 30, 30d supply, fill #0
  Filled 2022-06-17 – 2022-08-09 (×3): qty 30, 30d supply, fill #1
  Filled 2022-09-04: qty 30, 30d supply, fill #2
  Filled 2022-10-02: qty 30, 30d supply, fill #3

## 2021-11-01 MED ORDER — PREDNISONE 10 MG PO TABS
ORAL_TABLET | ORAL | 0 refills | Status: DC
  Filled 2021-11-01: qty 20, fill #0

## 2021-11-01 MED ORDER — AMLODIPINE BESYLATE 10 MG PO TABS
ORAL_TABLET | Freq: Every day | ORAL | 11 refills | Status: DC
  Filled 2021-11-01: qty 30, fill #0

## 2021-11-01 MED ORDER — BENZONATATE 100 MG PO CAPS
100.0000 mg | ORAL_CAPSULE | Freq: Three times a day (TID) | ORAL | 0 refills | Status: DC | PRN
  Filled 2021-11-01: qty 60, 20d supply, fill #0

## 2021-11-01 MED ORDER — FLUOXETINE HCL 40 MG PO CAPS
ORAL_CAPSULE | Freq: Every day | ORAL | 11 refills | Status: DC
  Filled 2021-11-01: qty 30, fill #0

## 2021-11-01 MED ORDER — VALSARTAN-HYDROCHLOROTHIAZIDE 320-25 MG PO TABS
1.0000 | ORAL_TABLET | Freq: Every day | ORAL | 3 refills | Status: DC
  Filled 2021-11-01: qty 60, 60d supply, fill #0

## 2021-11-01 MED ORDER — TRAZODONE HCL 100 MG PO TABS
ORAL_TABLET | Freq: Every day | ORAL | 6 refills | Status: DC
  Filled 2021-11-01: qty 30, fill #0

## 2021-11-01 MED ORDER — DICLOFENAC SODIUM 1 % EX GEL
2.0000 g | Freq: Four times a day (QID) | CUTANEOUS | 1 refills | Status: DC
  Filled 2021-11-01: qty 100, fill #0

## 2021-11-01 MED ORDER — CARVEDILOL 12.5 MG PO TABS
12.5000 mg | ORAL_TABLET | Freq: Two times a day (BID) | ORAL | 3 refills | Status: DC
  Filled 2021-11-01: qty 60, 30d supply, fill #0

## 2021-11-01 MED ORDER — CARVEDILOL 12.5 MG PO TABS
12.5000 mg | ORAL_TABLET | Freq: Two times a day (BID) | ORAL | 3 refills | Status: DC
  Filled 2021-11-01 – 2021-12-16 (×3): qty 60, 30d supply, fill #0
  Filled 2022-01-26 – 2022-03-20 (×3): qty 60, 30d supply, fill #1
  Filled 2022-04-24: qty 60, 30d supply, fill #2

## 2021-11-01 MED ORDER — PREDNISONE 10 MG PO TABS
40.0000 mg | ORAL_TABLET | Freq: Every day | ORAL | 0 refills | Status: AC
  Filled 2021-11-01: qty 20, 5d supply, fill #0

## 2021-11-01 MED ORDER — DICLOFENAC SODIUM 1 % EX GEL
2.0000 g | Freq: Four times a day (QID) | CUTANEOUS | 1 refills | Status: DC
  Filled 2021-11-01 – 2021-12-16 (×2): qty 100, 13d supply, fill #0

## 2021-11-01 MED ORDER — MONTELUKAST SODIUM 10 MG PO TABS
10.0000 mg | ORAL_TABLET | Freq: Every day | ORAL | 11 refills | Status: DC
  Filled 2021-11-01 – 2021-12-16 (×2): qty 30, 30d supply, fill #0
  Filled 2022-01-26 – 2022-03-20 (×3): qty 30, 30d supply, fill #1
  Filled 2022-04-24: qty 30, 30d supply, fill #2
  Filled 2022-05-19: qty 30, 30d supply, fill #0
  Filled 2022-05-19: qty 30, 30d supply, fill #3
  Filled 2022-06-17 – 2022-08-09 (×3): qty 30, 30d supply, fill #1
  Filled 2022-09-04: qty 30, 30d supply, fill #2
  Filled 2022-10-02: qty 30, 30d supply, fill #3

## 2021-11-01 MED ORDER — FLUOXETINE HCL 40 MG PO CAPS
40.0000 mg | ORAL_CAPSULE | Freq: Every day | ORAL | 11 refills | Status: DC
  Filled 2021-11-01 – 2021-12-16 (×2): qty 30, 30d supply, fill #0
  Filled 2022-01-26 – 2022-03-20 (×3): qty 30, 30d supply, fill #1
  Filled 2022-04-24: qty 30, 30d supply, fill #2
  Filled 2022-05-19: qty 30, 30d supply, fill #3
  Filled 2022-05-19: qty 30, 30d supply, fill #0
  Filled 2022-06-17 – 2022-08-09 (×3): qty 30, 30d supply, fill #1
  Filled 2022-09-04: qty 30, 30d supply, fill #2
  Filled 2022-10-02: qty 30, 30d supply, fill #3

## 2021-11-01 MED ORDER — PANTOPRAZOLE SODIUM 40 MG PO TBEC
DELAYED_RELEASE_TABLET | ORAL | 11 refills | Status: DC
  Filled 2021-11-01: qty 30, fill #0

## 2021-11-01 NOTE — Assessment & Plan Note (Signed)
We will continue with albuterol as needed and give pulse dose prednisone and benzonatate for cough

## 2021-11-01 NOTE — Progress Notes (Signed)
New Patient Office Visit  Subjective    Patient ID: Catherine Kane, female    DOB: September 23, 1970  Age: 51 y.o. MRN: [id number]  CC:  Chief Complaint  Patient presents with   New Patient (Initial Visit)    HPI Catherine Kane presents to establish care This is a 51 year old female former patient of patient care center but she cannot get an appointment there so she establishes here.  On arrival blood pressure 174/128.  She is out of all medications including all of her blood pressure medicines.  She has longstanding history of hypertension morbid obesity asthmatic COPD condition but no longer smoking.  She also is premenopausal with hot flashes and mood swings.  She has been on Prozac in the past which is controlled the symptoms she is out of this medication.  She has a dry cough used to be on an albuterol inhaler and is seen pulmonary previously.  She used to work for [employer] for [employer] but has lost her job 2 years ago.  She has chronic knee and hip pain.  She is in need of a Pap smear.  Patient also requesting gynecology referral.  Patient has been on prednisone with response in the past and also has taken Singulair with improvement.  Also has reflux symptoms would benefit from a PPI. Note patient has sheltered homelessness living in a motel with her husband Outpatient Encounter Medications as of 11/01/2021  Medication Sig   aspirin EC 81 MG tablet Take 81 mg by mouth daily.   chlorpheniramine (CHLOR-TRIMETON) 4 MG tablet Take 4 mg by mouth every 4 (four) hours as needed for allergies.   Ferrous Sulfate (IRON) 325 (65 Fe) MG TABS Take 1 tablet by mouth daily.   [DISCONTINUED] albuterol (VENTOLIN HFA) 108 (90 Base) MCG/ACT inhaler Inhale 2 puffs into the lungs every 6 (six) hours as needed for wheezing or shortness of breath.   [DISCONTINUED] benzonatate (TESSALON) 100 MG capsule Take 1 capsule (100 mg total) by mouth 3 (three) times daily as needed for  cough.   [DISCONTINUED] carvedilol (COREG) 12.5 MG tablet Take 1 tablet (12.5 mg total) by mouth 2 (two) times daily with a meal.   [DISCONTINUED] diclofenac Sodium (VOLTAREN) 1 % GEL Apply 2 g topically 4 (four) times daily. To painful joints   [DISCONTINUED] predniSONE (DELTASONE) 10 MG tablet Take 4 tablets daily for 5 days then stop   [DISCONTINUED] valsartan-hydrochlorothiazide (DIOVAN-HCT) 320-25 MG tablet Take 1 tablet by mouth daily.   albuterol (VENTOLIN HFA) 108 (90 Base) MCG/ACT inhaler Inhale 2 puffs into the lungs every 6 (six) hours as needed for wheezing or shortness of breath.   amLODipine (NORVASC) 10 MG tablet Take 1 tablet (10 mg total) by mouth daily.   benzonatate (TESSALON) 100 MG capsule Take 1 capsule (100 mg total) by mouth 3 (three) times daily as needed for cough.   carvedilol (COREG) 12.5 MG tablet Take 1 tablet (12.5 mg total) by mouth 2 (two) times daily with a meal.   diclofenac Sodium (VOLTAREN) 1 % GEL Apply 2 g topically 4 (four) times daily. To painful joints   ergocalciferol (VITAMIN D2) 1.25 MG (50000 UT) capsule Take 1 capsule (50,000 Units total) by mouth every 7 (seven) days.   FLUoxetine (PROZAC) 40 MG capsule Take 1 capsule (40 mg total) by mouth daily.   hydrochlorothiazide (HYDRODIURIL) 25 MG tablet TAKE 1 TABLET (25 MG TOTAL) BY MOUTH DAILY. (Patient not taking: Reported on 11/01/2021)   montelukast (SINGULAIR) 10  MG tablet Take 1 tablet (10 mg total) by mouth at bedtime.   pantoprazole (PROTONIX) 40 MG tablet Take 1 tablet (40 mg total) by mouth daily. Take 30-60 minutes before the first meal of the day.   predniSONE (DELTASONE) 10 MG tablet Take 4 tablets (40 mg total) by mouth daily for 5 days. Then stop.   Respiratory Therapy Supplies (FLUTTER) DEVI Use as directed (Patient not taking: Reported on 11/01/2021)   traZODone (DESYREL) 100 MG tablet Take 1 tablet (100 mg total) by mouth at bedtime.   valsartan-hydrochlorothiazide (DIOVAN-HCT) 320-25 MG  tablet Take 1 tablet by mouth daily.   [DISCONTINUED] albuterol (PROVENTIL) (2.5 MG/3ML) 0.083% nebulizer solution Take 3 mLs (2.5 mg total) by nebulization every 6 (six) hours as needed for wheezing or shortness of breath. (Patient not taking: Reported on 11/25/2019)   [DISCONTINUED] amLODipine (NORVASC) 10 MG tablet TAKE 1 TABLET (10 MG TOTAL) BY MOUTH DAILY. (Patient not taking: Reported on 11/01/2021)   [DISCONTINUED] amLODipine (NORVASC) 10 MG tablet TAKE 1 TABLET (10 MG TOTAL) BY MOUTH DAILY.   [DISCONTINUED] dextromethorphan (DELSYM) 30 MG/5ML liquid Take 15 mg by mouth as needed for cough. (Patient not taking: Reported on 11/01/2021)   [DISCONTINUED] ergocalciferol (VITAMIN D2) 1.25 MG (50000 UT) capsule TAKE 1 CAPSULE (50,000 UNITS TOTAL) BY MOUTH EVERY 7 (SEVEN) DAYS. (Patient not taking: Reported on 11/01/2021)   [DISCONTINUED] ergocalciferol (VITAMIN D2) 1.25 MG (50000 UT) capsule Take 1 capsule (50,000 Units total) by mouth every 7 (seven) days.   [DISCONTINUED] famotidine (PEPCID) 20 MG tablet One after supper (Patient not taking: Reported on 11/01/2021)   [DISCONTINUED] FLUoxetine (PROZAC) 40 MG capsule TAKE 1 CAPSULE (40 MG TOTAL) BY MOUTH DAILY. (Patient not taking: Reported on 11/01/2021)   [DISCONTINUED] FLUoxetine (PROZAC) 40 MG capsule TAKE 1 CAPSULE (40 MG TOTAL) BY MOUTH DAILY.   [DISCONTINUED] gabapentin (NEURONTIN) 300 MG capsule TAKE 2 CAPSULES (600 MG) 3 TIMES A DAY.   [DISCONTINUED] montelukast (SINGULAIR) 10 MG tablet TAKE 1 TABLET (10 MG TOTAL) BY MOUTH AT BEDTIME. (Patient not taking: Reported on 11/01/2021)   [DISCONTINUED] montelukast (SINGULAIR) 10 MG tablet TAKE 1 TABLET (10 MG TOTAL) BY MOUTH AT BEDTIME.   [DISCONTINUED] pantoprazole (PROTONIX) 40 MG tablet TAKE 1 TABLET (40 MG TOTAL) BY MOUTH DAILY. TAKE 30-60 MIN BEFORE FIRST MEAL OF THE DAY (Patient not taking: Reported on 11/01/2021)   [DISCONTINUED] pantoprazole (PROTONIX) 40 MG tablet TAKE 1 TABLET (40 MG TOTAL) BY  MOUTH DAILY. TAKE 30-60 MIN BEFORE FIRST MEAL OF THE DAY   [DISCONTINUED] telmisartan (MICARDIS) 40 MG tablet Take 1 tablet (40 mg total) by mouth daily. (Patient not taking: Reported on 11/01/2021)   [DISCONTINUED] traMADol (ULTRAM) 50 MG tablet TAKE 1 TABLET (50 MG TOTAL) BY MOUTH EVERY 4 (FOUR) HOURS AS NEEDED. (Patient not taking: Reported on 11/25/2019)   [DISCONTINUED] traZODone (DESYREL) 100 MG tablet TAKE 1 TABLET (100 MG TOTAL) BY MOUTH AT BEDTIME. (Patient not taking: Reported on 11/01/2021)   [DISCONTINUED] traZODone (DESYREL) 100 MG tablet TAKE 1 TABLET (100 MG TOTAL) BY MOUTH AT BEDTIME.   [DISCONTINUED] valACYclovir (VALTREX) 500 MG tablet Take 500 mg by mouth 2 (two) times daily. (Patient not taking: Reported on 11/25/2019)   No facility-administered encounter medications on file as of 11/01/2021.    Past Medical History:  Diagnosis Date   Anxiety 09/2019   Asthma    Bronchitis    Coronavirus infection 12/22/2018   Hypertension    Insomnia    Vitamin B12 deficiency 09/2019  Vitamin D deficiency 10/2018    Past Surgical History:  Procedure Laterality Date   TONSILLECTOMY     TUBAL LIGATION      Family History  Problem Relation Age of Onset   Heart disease Mother    Heart disease Sister    Breast cancer Maternal Aunt    Breast cancer Maternal Grandmother     Social History   Socioeconomic History   Marital status: Single    Spouse name: Not on file   Number of children: Not on file   Years of education: Not on file   Highest education level: 12th grade  Occupational History   Not on file  Tobacco Use   Smoking status: Never   Smokeless tobacco: Never  Vaping Use   Vaping Use: Never used  Substance and Sexual Activity   Alcohol use: Yes    Alcohol/week: 1.0 standard drink of alcohol    Types: 1 Glasses of wine per week    Comment: one glass once a month   Drug use: No   Sexual activity: Yes    Birth control/protection: Surgical  Other Topics  Concern   Not on file  Social History Narrative   Not on file   Social Determinants of Health   Financial Resource Strain: Not on file  Food Insecurity: Not on file  Transportation Needs: No Transportation Needs (11/26/2018)   PRAPARE - Hydrologist (Medical): No    Lack of Transportation (Non-Medical): No  Physical Activity: Not on file  Stress: Not on file  Social Connections: Not on file  Intimate Partner Violence: Not on file    Review of Systems  Constitutional:  Negative for chills, diaphoresis, fever, malaise/fatigue and weight loss.  HENT:  Negative for congestion, hearing loss, nosebleeds, sore throat and tinnitus.   Eyes:  Negative for blurred vision, photophobia and redness.  Respiratory:  Positive for cough, shortness of breath and wheezing. Negative for hemoptysis, sputum production and stridor.   Cardiovascular:  Negative for chest pain, palpitations, orthopnea, claudication, leg swelling and PND.  Gastrointestinal:  Negative for abdominal pain, blood in stool, constipation, diarrhea, heartburn, nausea and vomiting.  Genitourinary:  Negative for dysuria, flank pain, frequency, hematuria and urgency.  Musculoskeletal:  Negative for back pain, falls, joint pain, myalgias and neck pain.  Skin:  Negative for itching and rash.  Neurological:  Negative for dizziness, tingling, tremors, sensory change, speech change, focal weakness, seizures, loss of consciousness, weakness and headaches.  Endo/Heme/Allergies:  Negative for environmental allergies and polydipsia. Does not bruise/bleed easily.  Psychiatric/Behavioral:  Negative for memory loss, substance abuse and suicidal ideas. The patient is nervous/anxious. The patient does not have insomnia.         Objective    BP (!) 174/128   Pulse 67   Ht 5\' 3"  (1.6 m)   Wt 188 lb (85.3 kg)   SpO2 91%   BMI 33.30 kg/m   Physical Exam Vitals reviewed.  Constitutional:      Appearance: Normal  appearance. She is well-developed. She is obese. She is not diaphoretic.  HENT:     Head: Normocephalic and atraumatic.     Nose: No nasal deformity, septal deviation, mucosal edema or rhinorrhea.     Right Sinus: No maxillary sinus tenderness or frontal sinus tenderness.     Left Sinus: No maxillary sinus tenderness or frontal sinus tenderness.     Mouth/Throat:     Pharynx: No oropharyngeal exudate.  Eyes:  General: No scleral icterus.    Conjunctiva/sclera: Conjunctivae normal.     Pupils: Pupils are equal, round, and reactive to light.  Neck:     Thyroid: No thyromegaly.     Vascular: No carotid bruit or JVD.     Trachea: Trachea normal. No tracheal tenderness or tracheal deviation.  Cardiovascular:     Rate and Rhythm: Normal rate and regular rhythm.     Chest Wall: PMI is not displaced.     Pulses: Normal pulses. No decreased pulses.     Heart sounds: Normal heart sounds, S1 normal and S2 normal. Heart sounds not distant. No murmur heard.    No systolic murmur is present.     No diastolic murmur is present.     No friction rub. No gallop. No S3 or S4 sounds.  Pulmonary:     Effort: Pulmonary effort is normal. No tachypnea, accessory muscle usage or respiratory distress.     Breath sounds: No stridor. Wheezing present. No decreased breath sounds, rhonchi or rales.  Chest:     Chest wall: No tenderness.  Abdominal:     General: Bowel sounds are normal. There is no distension.     Palpations: Abdomen is soft. Abdomen is not rigid.     Tenderness: There is no abdominal tenderness. There is no guarding or rebound.  Musculoskeletal:        General: Normal range of motion.     Cervical back: Normal range of motion and neck supple. No edema, erythema or rigidity. No muscular tenderness. Normal range of motion.  Lymphadenopathy:     Head:     Right side of head: No submental or submandibular adenopathy.     Left side of head: No submental or submandibular adenopathy.      Cervical: No cervical adenopathy.  Skin:    General: Skin is warm and dry.     Coloration: Skin is not pale.     Findings: No rash.     Nails: There is no clubbing.  Neurological:     Mental Status: She is alert and oriented to person, place, and time.     Sensory: No sensory deficit.  Psychiatric:        Speech: Speech normal.        Behavior: Behavior normal.         Assessment & Plan:   Problem List Items Addressed This Visit       Cardiovascular and Mediastinum   Essential hypertension - Primary    Hypertension currently with excess cough will begin valsartan HCT 320-25 mg daily and also carvedilol 12-1/2 mg twice daily  Assess metabolic panel and blood counts      Relevant Medications   amLODipine (NORVASC) 10 MG tablet   carvedilol (COREG) 12.5 MG tablet   valsartan-hydrochlorothiazide (DIOVAN-HCT) 320-25 MG tablet   Other Relevant Orders   Comprehensive metabolic panel   CBC with Differential/Platelet     Respiratory   Chronic obstructive pulmonary disease ([place])    We will continue with albuterol as needed and give pulse dose prednisone and benzonatate for cough      Relevant Medications   albuterol (VENTOLIN HFA) 108 (90 Base) MCG/ACT inhaler   benzonatate (TESSALON) 100 MG capsule   montelukast (SINGULAIR) 10 MG tablet   predniSONE (DELTASONE) 10 MG tablet     Other   Well woman exam with routine gynecological exam    Needs another pelvic exam will refer back to gynecology she is premenopausal and  also resume Prozac      Anxiety    Resume Prozac      Relevant Medications   FLUoxetine (PROZAC) 40 MG capsule   traZODone (DESYREL) 100 MG tablet   Perimenopausal    Referral to gynecology made      Relevant Orders   Ambulatory referral to Gynecology   RESOLVED: Shortness of breath   Relevant Medications   montelukast (SINGULAIR) 10 MG tablet   RESOLVED: Cough   Relevant Medications   montelukast (SINGULAIR) 10 MG tablet   pantoprazole  (PROTONIX) 40 MG tablet   Other Visit Diagnoses     Vitamin D deficiency       Relevant Medications   ergocalciferol (VITAMIN D2) 1.25 MG (50000 UT) capsule   Insomnia, unspecified type       Relevant Medications   traZODone (DESYREL) 100 MG tablet   Encounter for health-related screening       Relevant Orders   Lipid panel   HIV Antibody (routine testing w rflx)   HCV Ab w Reflex to Quant PCR   Need for immunization against influenza       Relevant Orders   Flu Vaccine QUAD 45mo+IM (Fluarix, Fluzone & Alfiuria Quad PF) (Completed)       Return in about 3 months (around 01/31/2022) for htn, asthma, copd, anxiety.   Asencion Noble, MD

## 2021-11-01 NOTE — Assessment & Plan Note (Signed)
Resume Prozac 

## 2021-11-01 NOTE — Assessment & Plan Note (Signed)
Needs another pelvic exam will refer back to gynecology she is premenopausal and also resume Prozac

## 2021-11-01 NOTE — Assessment & Plan Note (Signed)
Referral to gynecology made 

## 2021-11-01 NOTE — Patient Instructions (Addendum)
Begin valsartan HCT 1 daily, continue amlodipine daily, begin carvedilol twice daily all for your blood pressure  Voltaren gel to be applied to your joints and do the exercises we discussed on your knees  Take prednisone 40 mg a day for 5 days is sent to pharmacy  Use benzonatate 3 times daily as needed for cough  Pick up an albuterol inhaler to take as needed  Prozac was renewed and trazodone was renewed Singulair was renewed  Take Protonix daily for acid  Complete screening labs obtained  Pap smear will be scheduled and referral to gynecology will be made  Return to see Lurena Joiner our clinical pharmacist in 1 month for blood pressure see Dr. Joya Gaskins again in 3 months  Go to behavioral health for evaluations of your mental health see below and we will have our clinical social worker call you Ms. Nevada Crane for counseling  [facility] [address] [place], [place] [address] [phone] or [phone] Walk-in urgent care 24/7 for anyone  For [facility] ONLY New patient assessments and therapy walk-ins: Monday and Wednesday 8am-11am First and second Friday 1pm-5pm New patient psychiatry and medication management walk-ins: Mondays, Wednesdays, Thursdays, Fridays 8am-11am No psychiatry walk-ins on Tuesday

## 2021-11-01 NOTE — Assessment & Plan Note (Signed)
Hypertension currently with excess cough will begin valsartan HCT 320-25 mg daily and also carvedilol 12-1/2 mg twice daily  Assess metabolic panel and blood counts

## 2021-11-02 ENCOUNTER — Other Ambulatory Visit: Payer: Self-pay

## 2021-11-02 ENCOUNTER — Other Ambulatory Visit: Payer: Self-pay | Admitting: Critical Care Medicine

## 2021-11-02 DIAGNOSIS — E782 Mixed hyperlipidemia: Secondary | ICD-10-CM

## 2021-11-02 DIAGNOSIS — E876 Hypokalemia: Secondary | ICD-10-CM

## 2021-11-02 LAB — CBC WITH DIFFERENTIAL/PLATELET
Basophils Absolute: 0.1 10*3/uL (ref 0.0–0.2)
Basos: 2 %
EOS (ABSOLUTE): 0.1 10*3/uL (ref 0.0–0.4)
Eos: 2 %
Hematocrit: 46 % (ref 34.0–46.6)
Hemoglobin: 15.2 g/dL (ref 11.1–15.9)
Immature Grans (Abs): 0 10*3/uL (ref 0.0–0.1)
Immature Granulocytes: 0 %
Lymphocytes Absolute: 1.4 10*3/uL (ref 0.7–3.1)
Lymphs: 36 %
MCH: 29.2 pg (ref 26.6–33.0)
MCHC: 33 g/dL (ref 31.5–35.7)
MCV: 88 fL (ref 79–97)
Monocytes Absolute: 0.4 10*3/uL (ref 0.1–0.9)
Monocytes: 12 %
Neutrophils Absolute: 1.9 10*3/uL (ref 1.4–7.0)
Neutrophils: 48 %
Platelets: 305 10*3/uL (ref 150–450)
RBC: 5.21 x10E6/uL (ref 3.77–5.28)
RDW: 13.4 % (ref 11.7–15.4)
WBC: 3.8 10*3/uL (ref 3.4–10.8)

## 2021-11-02 LAB — LIPID PANEL
Chol/HDL Ratio: 3.6 ratio (ref 0.0–4.4)
Cholesterol, Total: 172 mg/dL (ref 100–199)
HDL: 48 mg/dL (ref >39)
LDL Chol Calc (NIH): 113 mg/dL — ABNORMAL HIGH (ref 0–99)
Triglycerides: 57 mg/dL (ref 0–149)
VLDL Cholesterol Cal: 11 mg/dL (ref 5–40)

## 2021-11-02 LAB — COMPREHENSIVE METABOLIC PANEL
ALT: 16 IU/L (ref 0–32)
AST: 15 IU/L (ref 0–40)
Albumin/Globulin Ratio: 1.5 (ref 1.2–2.2)
Albumin: 4.6 g/dL (ref 3.9–4.9)
Alkaline Phosphatase: 77 IU/L (ref 44–121)
BUN/Creatinine Ratio: 15 (ref 9–23)
BUN: 12 mg/dL (ref 6–24)
Bilirubin Total: 0.3 mg/dL (ref 0.0–1.2)
CO2: 25 mmol/L (ref 20–29)
Calcium: 9.7 mg/dL (ref 8.7–10.2)
Chloride: 102 mmol/L (ref 96–106)
Creatinine, Ser: 0.78 mg/dL (ref 0.57–1.00)
Globulin, Total: 3.1 g/dL (ref 1.5–4.5)
Glucose: 87 mg/dL (ref 70–99)
Potassium: 3.3 mmol/L — ABNORMAL LOW (ref 3.5–5.2)
Sodium: 141 mmol/L (ref 134–144)
Total Protein: 7.7 g/dL (ref 6.0–8.5)
eGFR: 94 mL/min/{1.73_m2} (ref >59)

## 2021-11-02 LAB — HIV ANTIBODY (ROUTINE TESTING W REFLEX): HIV Screen 4th Generation wRfx: NONREACTIVE

## 2021-11-02 LAB — HCV AB W REFLEX TO QUANT PCR: HCV Ab: NONREACTIVE

## 2021-11-02 LAB — HCV INTERPRETATION

## 2021-11-02 MED ORDER — POTASSIUM CHLORIDE ER 10 MEQ PO TBCR
10.0000 meq | EXTENDED_RELEASE_TABLET | Freq: Two times a day (BID) | ORAL | 2 refills | Status: DC
  Filled 2021-11-02 – 2021-12-12 (×3): qty 90, 45d supply, fill #0
  Filled 2022-01-26 – 2022-03-20 (×3): qty 90, 45d supply, fill #1

## 2021-11-02 MED ORDER — ATORVASTATIN CALCIUM 10 MG PO TABS
10.0000 mg | ORAL_TABLET | Freq: Every day | ORAL | 3 refills | Status: DC
  Filled 2021-11-02 – 2021-12-12 (×3): qty 90, 90d supply, fill #0
  Filled 2022-01-26 – 2022-03-20 (×2): qty 90, 90d supply, fill #1

## 2021-11-02 NOTE — Progress Notes (Signed)
Let pt know Potassium low I sent potassium pill for her to take daily with her blood pressure meds, kidney liver normal Cholesterol high I sent cholesterol pill to pharmacy, blood count normal   hiv hep c neg

## 2021-11-04 ENCOUNTER — Telehealth: Payer: Self-pay

## 2021-11-04 NOTE — Telephone Encounter (Signed)
Pt was called and is aware of results, DOB was confirmed.  ?

## 2021-11-04 NOTE — Telephone Encounter (Signed)
-----   Message from Catherine Stain, MD sent at 11/02/2021  8:35 AM EDT ----- Let pt know Potassium low I sent potassium pill for her to take daily with her blood pressure meds, kidney liver normal Cholesterol high I sent cholesterol pill to pharmacy, blood count normal   hiv hep c neg

## 2021-11-08 ENCOUNTER — Other Ambulatory Visit: Payer: Self-pay

## 2021-12-05 ENCOUNTER — Ambulatory Visit: Payer: Medicaid Other | Admitting: Pharmacist

## 2021-12-07 ENCOUNTER — Ambulatory Visit: Payer: Self-pay | Admitting: Physician Assistant

## 2021-12-08 ENCOUNTER — Other Ambulatory Visit (HOSPITAL_COMMUNITY): Payer: Self-pay

## 2021-12-09 ENCOUNTER — Other Ambulatory Visit (HOSPITAL_COMMUNITY): Payer: Self-pay

## 2021-12-12 ENCOUNTER — Other Ambulatory Visit: Payer: Self-pay

## 2021-12-12 ENCOUNTER — Other Ambulatory Visit (HOSPITAL_COMMUNITY): Payer: Self-pay

## 2021-12-12 ENCOUNTER — Telehealth: Payer: Self-pay | Admitting: Critical Care Medicine

## 2021-12-12 NOTE — Telephone Encounter (Signed)
Patient states Dr. Joya Gaskins arranged were she can receive her medication for free for 6 months starting in September 2023.  Patient was able to pick up her first refill in September for free and now that she is out the pharmacy does not know what she is referring too but they do have potassium chloride (KLOR-CON) 10 MEQ tablet and atorvastatin (LIPITOR) 10 MG tablet ready for pick up at [facility]   As per Zacarias Pontes the prescription would be approximately $40 and at  [facility] it would be approximately $14.    [place] Phone: [phone]  Fax: [phone]

## 2021-12-12 NOTE — Telephone Encounter (Signed)
Spoke to patient informed of message per pharmacy-  can help with our discounted prices verses her getting it filled at [facility].  Message from Dr. Joya Gaskins regarding free medications x 6 mos was not noted in chart.   Patient advised that [place] has them at discounted rate. Verbalized understanding.

## 2021-12-16 ENCOUNTER — Other Ambulatory Visit: Payer: Self-pay | Admitting: Critical Care Medicine

## 2021-12-16 ENCOUNTER — Other Ambulatory Visit: Payer: Self-pay

## 2021-12-16 ENCOUNTER — Other Ambulatory Visit (HOSPITAL_COMMUNITY): Payer: Self-pay

## 2021-12-16 MED ORDER — ALBUTEROL SULFATE HFA 108 (90 BASE) MCG/ACT IN AERS
2.0000 | INHALATION_SPRAY | Freq: Four times a day (QID) | RESPIRATORY_TRACT | 1 refills | Status: DC | PRN
  Filled 2021-12-16: qty 6.7, 25d supply, fill #0
  Filled 2022-01-26 – 2022-03-20 (×3): qty 6.7, 25d supply, fill #1

## 2021-12-16 NOTE — Telephone Encounter (Signed)
Requested Prescriptions  Pending Prescriptions Disp Refills   albuterol (VENTOLIN HFA) 108 (90 Base) MCG/ACT inhaler 6.7 g 1    Sig: Inhale 2 puffs into the lungs every 6 (six) hours as needed for wheezing or shortness of breath.     Pulmonology:  Beta Agonists 2 Failed - 12/16/2021  3:03 PM      Failed - Last BP in normal range    BP Readings from Last 1 Encounters:  11/01/21 (!) 174/128         Passed - Last Heart Rate in normal range    Pulse Readings from Last 1 Encounters:  11/01/21 67         Passed - Valid encounter within last 12 months    Recent Outpatient Visits           1 month ago Essential hypertension   [facility] Storm Frisk, MD       Future Appointments             In 3 weeks Lois Huxley, Cornelius Moras, RPH-CPP [facility]   In 3 weeks Hoy Register, MD [facility]   In 1 month D[place] Charlcie Cradle, MD [facility]

## 2021-12-20 ENCOUNTER — Other Ambulatory Visit: Payer: Self-pay

## 2021-12-22 ENCOUNTER — Other Ambulatory Visit: Payer: Self-pay

## 2021-12-26 ENCOUNTER — Other Ambulatory Visit: Payer: Self-pay

## 2022-01-10 ENCOUNTER — Ambulatory Visit: Payer: Medicaid Other | Admitting: Pharmacist

## 2022-01-11 ENCOUNTER — Ambulatory Visit: Payer: Medicaid Other | Admitting: Family Medicine

## 2022-01-11 ENCOUNTER — Ambulatory Visit: Payer: Medicaid Other | Admitting: Physician Assistant

## 2022-01-12 ENCOUNTER — Other Ambulatory Visit: Payer: Self-pay

## 2022-01-26 ENCOUNTER — Other Ambulatory Visit: Payer: Self-pay | Admitting: Critical Care Medicine

## 2022-01-27 ENCOUNTER — Other Ambulatory Visit: Payer: Self-pay

## 2022-01-27 MED ORDER — BENZONATATE 100 MG PO CAPS
100.0000 mg | ORAL_CAPSULE | Freq: Three times a day (TID) | ORAL | 0 refills | Status: DC | PRN
  Filled 2022-01-27 – 2022-03-20 (×3): qty 21, 7d supply, fill #0

## 2022-01-30 NOTE — Progress Notes (Deleted)
New Patient Office Visit  Subjective    Patient ID: Catherine Kane, female    DOB: Jun 22, 1970  Age: 51 y.o. MRN: [id number]  CC:  No chief complaint on file.   HPI 11/01/21 Catherine Kane presents to establish care This is a 51 year old female former patient of patient care center but she cannot get an appointment there so she establishes here.  On arrival blood pressure 174/128.  She is out of all medications including all of her blood pressure medicines.  She has longstanding history of hypertension morbid obesity asthmatic COPD condition but no longer smoking.  She also is premenopausal with hot flashes and mood swings.  She has been on Prozac in the past which is controlled the symptoms she is out of this medication.  She has a dry cough used to be on an albuterol inhaler and is seen pulmonary previously.  She used to work for [employer] for [employer] but has lost her job 2 years ago.  She has chronic knee and hip pain.  She is in need of a Pap smear.  Patient also requesting gynecology referral.  Patient has been on prednisone with response in the past and also has taken Singulair with improvement.  Also has reflux symptoms would benefit from a PPI. Note patient has sheltered homelessness living in a motel with her husband  01/31/22 Essential hypertension - Primary       Hypertension currently with excess cough will begin valsartan HCT 320-25 mg daily and also carvedilol 12-1/2 mg twice daily   Assess metabolic panel and blood counts        Relevant Medications    amLODipine (NORVASC) 10 MG tablet    carvedilol (COREG) 12.5 MG tablet    valsartan-hydrochlorothiazide (DIOVAN-HCT) 320-25 MG tablet    Other Relevant Orders    Comprehensive metabolic panel    CBC with Differential/Platelet        Respiratory    Chronic obstructive pulmonary disease (HCC)      We will continue with albuterol as needed and give pulse dose prednisone and benzonatate  for cough        Relevant Medications    albuterol (VENTOLIN HFA) 108 (90 Base) MCG/ACT inhaler    benzonatate (TESSALON) 100 MG capsule    montelukast (SINGULAIR) 10 MG tablet    predniSONE (DELTASONE) 10 MG tablet        Other    Well woman exam with routine gynecological exam      Needs another pelvic exam will refer back to gynecology she is premenopausal and also resume Prozac        Anxiety      Resume Prozac        Relevant Medications    FLUoxetine (PROZAC) 40 MG capsule    traZODone (DESYREL) 100 MG tablet    Perimenopausal      Referral to gynecology made        Relevant Orders    Ambulatory referral to Gynecology   Outpatient Encounter Medications as of 01/31/2022  Medication Sig   albuterol (VENTOLIN HFA) 108 (90 Base) MCG/ACT inhaler Inhale 2 puffs into the lungs every 6 (six) hours as needed for wheezing or shortness of breath.   amLODipine (NORVASC) 10 MG tablet Take 1 tablet (10 mg total) by mouth daily.   aspirin EC 81 MG tablet Take 81 mg by mouth daily.   atorvastatin (LIPITOR) 10 MG tablet Take 1 tablet (10 mg total) by mouth daily.  benzonatate (TESSALON) 100 MG capsule Take 1 capsule (100 mg total) by mouth 3 (three) times daily as needed for cough.   carvedilol (COREG) 12.5 MG tablet Take 1 tablet (12.5 mg total) by mouth 2 (two) times daily with a meal.   chlorpheniramine (CHLOR-TRIMETON) 4 MG tablet Take 4 mg by mouth every 4 (four) hours as needed for allergies.   diclofenac Sodium (VOLTAREN) 1 % GEL Apply 2 g topically 4 (four) times daily. To painful joints   ergocalciferol (VITAMIN D2) 1.25 MG (50000 UT) capsule Take 1 capsule (50,000 Units total) by mouth every 7 (seven) days.   Ferrous Sulfate (IRON) 325 (65 Fe) MG TABS Take 1 tablet by mouth daily.   FLUoxetine (PROZAC) 40 MG capsule Take 1 capsule (40 mg total) by mouth daily.   hydrochlorothiazide (HYDRODIURIL) 25 MG tablet TAKE 1 TABLET (25 MG TOTAL) BY MOUTH DAILY. (Patient not taking:  Reported on 11/01/2021)   montelukast (SINGULAIR) 10 MG tablet Take 1 tablet (10 mg total) by mouth at bedtime.   pantoprazole (PROTONIX) 40 MG tablet Take 1 tablet (40 mg total) by mouth daily. Take 30-60 minutes before the first meal of the day.   potassium chloride (KLOR-CON) 10 MEQ tablet Take 1 tablet (10 mEq total) by mouth 2 (two) times daily.   Respiratory Therapy Supplies (FLUTTER) DEVI Use as directed (Patient not taking: Reported on 11/01/2021)   traZODone (DESYREL) 100 MG tablet Take 1 tablet (100 mg total) by mouth at bedtime.   valsartan-hydrochlorothiazide (DIOVAN-HCT) 320-25 MG tablet Take 1 tablet by mouth daily.   No facility-administered encounter medications on file as of 01/31/2022.    Past Medical History:  Diagnosis Date   Anxiety 09/2019   Asthma    Bronchitis    Coronavirus infection 12/22/2018   Hypertension    Insomnia    Vitamin B12 deficiency 09/2019   Vitamin D deficiency 10/2018    Past Surgical History:  Procedure Laterality Date   TONSILLECTOMY     TUBAL LIGATION      Family History  Problem Relation Age of Onset   Heart disease Mother    Heart disease Sister    Breast cancer Maternal Aunt    Breast cancer Maternal Grandmother     Social History   Socioeconomic History   Marital status: Single    Spouse name: Not on file   Number of children: Not on file   Years of education: Not on file   Highest education level: 12th grade  Occupational History   Not on file  Tobacco Use   Smoking status: Never   Smokeless tobacco: Never  Vaping Use   Vaping Use: Never used  Substance and Sexual Activity   Alcohol use: Yes    Alcohol/week: 1.0 standard drink of alcohol    Types: 1 Glasses of wine per week    Comment: one glass once a month   Drug use: No   Sexual activity: Yes    Birth control/protection: Surgical  Other Topics Concern   Not on file  Social History Narrative   Not on file   Social Determinants of Health   Financial  Resource Strain: Not on file  Food Insecurity: Not on file  Transportation Needs: No Transportation Needs (11/26/2018)   PRAPARE - Administrator, Civil Service (Medical): No    Lack of Transportation (Non-Medical): No  Physical Activity: Not on file  Stress: Not on file  Social Connections: Not on file  Intimate Partner Violence:  Not on file    Review of Systems  Constitutional:  Negative for chills, diaphoresis, fever, malaise/fatigue and weight loss.  HENT:  Negative for congestion, hearing loss, nosebleeds, sore throat and tinnitus.   Eyes:  Negative for blurred vision, photophobia and redness.  Respiratory:  Positive for cough, shortness of breath and wheezing. Negative for hemoptysis, sputum production and stridor.   Cardiovascular:  Negative for chest pain, palpitations, orthopnea, claudication, leg swelling and PND.  Gastrointestinal:  Negative for abdominal pain, blood in stool, constipation, diarrhea, heartburn, nausea and vomiting.  Genitourinary:  Negative for dysuria, flank pain, frequency, hematuria and urgency.  Musculoskeletal:  Negative for back pain, falls, joint pain, myalgias and neck pain.  Skin:  Negative for itching and rash.  Neurological:  Negative for dizziness, tingling, tremors, sensory change, speech change, focal weakness, seizures, loss of consciousness, weakness and headaches.  Endo/Heme/Allergies:  Negative for environmental allergies and polydipsia. Does not bruise/bleed easily.  Psychiatric/Behavioral:  Negative for memory loss, substance abuse and suicidal ideas. The patient is nervous/anxious. The patient does not have insomnia.         Objective    There were no vitals taken for this visit.  Physical Exam Vitals reviewed.  Constitutional:      Appearance: Normal appearance. She is well-developed. She is obese. She is not diaphoretic.  HENT:     Head: Normocephalic and atraumatic.     Nose: No nasal deformity, septal deviation,  mucosal edema or rhinorrhea.     Right Sinus: No maxillary sinus tenderness or frontal sinus tenderness.     Left Sinus: No maxillary sinus tenderness or frontal sinus tenderness.     Mouth/Throat:     Pharynx: No oropharyngeal exudate.  Eyes:     General: No scleral icterus.    Conjunctiva/sclera: Conjunctivae normal.     Pupils: Pupils are equal, round, and reactive to light.  Neck:     Thyroid: No thyromegaly.     Vascular: No carotid bruit or JVD.     Trachea: Trachea normal. No tracheal tenderness or tracheal deviation.  Cardiovascular:     Rate and Rhythm: Normal rate and regular rhythm.     Chest Wall: PMI is not displaced.     Pulses: Normal pulses. No decreased pulses.     Heart sounds: Normal heart sounds, S1 normal and S2 normal. Heart sounds not distant. No murmur heard.    No systolic murmur is present.     No diastolic murmur is present.     No friction rub. No gallop. No S3 or S4 sounds.  Pulmonary:     Effort: Pulmonary effort is normal. No tachypnea, accessory muscle usage or respiratory distress.     Breath sounds: No stridor. Wheezing present. No decreased breath sounds, rhonchi or rales.  Chest:     Chest wall: No tenderness.  Abdominal:     General: Bowel sounds are normal. There is no distension.     Palpations: Abdomen is soft. Abdomen is not rigid.     Tenderness: There is no abdominal tenderness. There is no guarding or rebound.  Musculoskeletal:        General: Normal range of motion.     Cervical back: Normal range of motion and neck supple. No edema, erythema or rigidity. No muscular tenderness. Normal range of motion.  Lymphadenopathy:     Head:     Right side of head: No submental or submandibular adenopathy.     Left side of head: No submental  or submandibular adenopathy.     Cervical: No cervical adenopathy.  Skin:    General: Skin is warm and dry.     Coloration: Skin is not pale.     Findings: No rash.     Nails: There is no clubbing.   Neurological:     Mental Status: She is alert and oriented to person, place, and time.     Sensory: No sensory deficit.  Psychiatric:        Speech: Speech normal.        Behavior: Behavior normal.         Assessment & Plan:   Problem List Items Addressed This Visit   None  No follow-ups on file.   Shan Levans, MD

## 2022-01-31 ENCOUNTER — Ambulatory Visit: Payer: Self-pay | Admitting: Critical Care Medicine

## 2022-02-02 ENCOUNTER — Other Ambulatory Visit: Payer: Self-pay

## 2022-02-03 ENCOUNTER — Other Ambulatory Visit: Payer: Self-pay

## 2022-03-20 ENCOUNTER — Other Ambulatory Visit: Payer: Self-pay

## 2022-03-20 ENCOUNTER — Other Ambulatory Visit: Payer: Self-pay | Admitting: Critical Care Medicine

## 2022-03-21 ENCOUNTER — Other Ambulatory Visit: Payer: Self-pay

## 2022-03-21 MED ORDER — ALBUTEROL SULFATE HFA 108 (90 BASE) MCG/ACT IN AERS
2.0000 | INHALATION_SPRAY | Freq: Four times a day (QID) | RESPIRATORY_TRACT | 3 refills | Status: DC | PRN
  Filled 2022-03-21: qty 18, 25d supply, fill #0

## 2022-03-21 NOTE — Telephone Encounter (Signed)
Requested Prescriptions  Pending Prescriptions Disp Refills   albuterol (VENTOLIN HFA) 108 (90 Base) MCG/ACT inhaler 6.7 g 3    Sig: Inhale 2 puffs into the lungs every 6 (six) hours as needed for wheezing or shortness of breath.     Pulmonology:  Beta Agonists 2 Failed - 03/20/2022  9:58 AM      Failed - Last BP in normal range    BP Readings from Last 1 Encounters:  11/01/21 (!) 174/128         Passed - Last Heart Rate in normal range    Pulse Readings from Last 1 Encounters:  11/01/21 67         Passed - Valid encounter within last 12 months    Recent Outpatient Visits           4 months ago Essential hypertension   [place], MD       Future Appointments             In 1 month Joya Gaskins Burnett Harry, MD [place]

## 2022-03-22 ENCOUNTER — Other Ambulatory Visit: Payer: Self-pay

## 2022-04-11 ENCOUNTER — Other Ambulatory Visit: Payer: Self-pay

## 2022-04-11 ENCOUNTER — Encounter (HOSPITAL_COMMUNITY): Payer: Self-pay

## 2022-04-11 ENCOUNTER — Emergency Department (HOSPITAL_COMMUNITY)
Admission: EM | Admit: 2022-04-11 | Discharge: 2022-04-11 | Disposition: A | Discharge status: Home / Self Care | Payer: Medicaid Other | Attending: Emergency Medicine | Admitting: Emergency Medicine

## 2022-04-11 ENCOUNTER — Emergency Department (HOSPITAL_COMMUNITY): Payer: Medicaid Other

## 2022-04-11 DIAGNOSIS — R112 Nausea with vomiting, unspecified: Secondary | ICD-10-CM | POA: Diagnosis not present

## 2022-04-11 DIAGNOSIS — R519 Headache, unspecified: Secondary | ICD-10-CM | POA: Diagnosis not present

## 2022-04-11 DIAGNOSIS — Z7982 Long term (current) use of aspirin: Secondary | ICD-10-CM | POA: Insufficient documentation

## 2022-04-11 MED ORDER — KETOROLAC TROMETHAMINE 30 MG/ML IJ SOLN
15.0000 mg | Freq: Once | INTRAMUSCULAR | Status: AC
  Administered 2022-04-11: 15 mg via INTRAVENOUS
  Filled 2022-04-11: qty 1

## 2022-04-11 MED ORDER — ONDANSETRON HCL 4 MG PO TABS
4.0000 mg | ORAL_TABLET | Freq: Three times a day (TID) | ORAL | 0 refills | Status: DC | PRN
  Filled 2022-04-11: qty 10, 4d supply, fill #0

## 2022-04-11 MED ORDER — SODIUM CHLORIDE 0.9 % IV BOLUS
500.0000 mL | Freq: Once | INTRAVENOUS | Status: AC
  Administered 2022-04-11: 500 mL via INTRAVENOUS

## 2022-04-11 MED ORDER — METOCLOPRAMIDE HCL 5 MG/ML IJ SOLN
5.0000 mg | Freq: Once | INTRAMUSCULAR | Status: AC
  Administered 2022-04-11: 5 mg via INTRAVENOUS
  Filled 2022-04-11: qty 2

## 2022-04-11 MED ORDER — DIPHENHYDRAMINE HCL 50 MG/ML IJ SOLN
12.5000 mg | Freq: Once | INTRAMUSCULAR | Status: AC
  Administered 2022-04-11: 12.5 mg via INTRAVENOUS
  Filled 2022-04-11: qty 1

## 2022-04-11 NOTE — ED Triage Notes (Signed)
Headache x 3 days. Pressure behind eyes and in temporal region.   Several episodes of vomiting.

## 2022-04-11 NOTE — Discharge Instructions (Signed)
Contact a health care provider if: You have symptoms that are different or worse than your usual migraine headache symptoms. You have more than 15 days of headaches in one month. Get help right away if: Your migraine headache becomes severe or lasts more than 72 hours. You have a fever or stiff neck. You have vision loss. Your muscles feel weak or like you cannot control them. You lose your balance often or have trouble walking. You faint. You have a seizure

## 2022-04-11 NOTE — ED Provider Notes (Signed)
[facility] Provider Note   CSN: [id number] Arrival date & time: 04/11/22  [id number]     History  Chief Complaint  Patient presents with   Headache    Catherine Kane is a 52 y.o. female who presents emergency department with chief complaint of headache.  Patient states that she has had 3 days of progressively worsening but persistent throbbing global headache with light sensitivity D, nausea and vomiting.  She reports she had a headache about this bad approximately 7 years ago that lasted about a day and then resolved without any incident.  She does not frequently get headaches.  She denies vision change, unilateral weakness, neck stiffness, fevers, chills, abdominal pain, sensory deficits or other neurologic complaint.  She denies head injury or sudden onset of severe headache.  She is on no new medications   Headache      Home Medications Prior to Admission medications   Medication Sig Start Date End Date Taking? Authorizing Provider  ondansetron (ZOFRAN) 4 MG tablet Take 1 tablet (4 mg total) by mouth every 8 (eight) hours as needed for nausea or vomiting. 04/11/22  Yes Avonelle Viveros, PA-C  albuterol (VENTOLIN HFA) 108 (90 Base) MCG/ACT inhaler Inhale 2 puffs into the lungs every 6 (six) hours as needed for wheezing or shortness of breath. 03/21/22   Elsie Stain, MD  amLODipine (NORVASC) 10 MG tablet Take 1 tablet (10 mg total) by mouth daily. 11/01/21   Elsie Stain, MD  aspirin EC 81 MG tablet Take 81 mg by mouth daily.    [provider]  atorvastatin (LIPITOR) 10 MG tablet Take 1 tablet (10 mg total) by mouth daily. 11/02/21   Elsie Stain, MD  benzonatate (TESSALON) 100 MG capsule Take 1 capsule (100 mg total) by mouth 3 (three) times daily as needed for cough. 01/27/22   Elsie Stain, MD  carvedilol (COREG) 12.5 MG tablet Take 1 tablet (12.5 mg total) by mouth 2 (two) times daily with a meal.  11/01/21   Elsie Stain, MD  chlorpheniramine (CHLOR-TRIMETON) 4 MG tablet Take 4 mg by mouth every 4 (four) hours as needed for allergies.    [provider]  diclofenac Sodium (VOLTAREN) 1 % GEL Apply 2 g topically 4 (four) times daily. To painful joints 11/01/21   Elsie Stain, MD  ergocalciferol (VITAMIN D2) 1.25 MG (50000 UT) capsule Take 1 capsule (50,000 Units total) by mouth every 7 (seven) days. 11/01/21   Elsie Stain, MD  Ferrous Sulfate (IRON) 325 (65 Fe) MG TABS Take 1 tablet by mouth daily.    [provider]  FLUoxetine (PROZAC) 40 MG capsule Take 1 capsule (40 mg total) by mouth daily. 11/01/21   Elsie Stain, MD  hydrochlorothiazide (HYDRODIURIL) 25 MG tablet TAKE 1 TABLET (25 MG TOTAL) BY MOUTH DAILY. Patient not taking: Reported on 11/01/2021 09/08/19 09/07/20  Azzie Glatter, FNP  montelukast (SINGULAIR) 10 MG tablet Take 1 tablet (10 mg total) by mouth at bedtime. 11/01/21   Elsie Stain, MD  pantoprazole (PROTONIX) 40 MG tablet Take 1 tablet (40 mg total) by mouth daily. Take 30-60 minutes before the first meal of the day. 11/01/21   Elsie Stain, MD  potassium chloride (KLOR-CON) 10 MEQ tablet Take 1 tablet (10 mEq total) by mouth 2 (two) times daily. 11/02/21   Elsie Stain, MD  Respiratory Therapy Supplies (FLUTTER) DEVI Use as directed Patient not taking:  Reported on 11/01/2021 01/22/19   Tanda Rockers, MD  traZODone (DESYREL) 100 MG tablet Take 1 tablet (100 mg total) by mouth at bedtime. 11/01/21   Elsie Stain, MD  valsartan-hydrochlorothiazide (DIOVAN-HCT) 320-25 MG tablet Take 1 tablet by mouth daily. 11/01/21   Elsie Stain, MD      Allergies    Grass pollen([id number] vern)    Review of Systems   Review of Systems  Neurological:  Positive for headaches.    Physical Exam Updated Vital Signs BP 123/89   Pulse 66   Temp 97.8 F (36.6 C) (Oral)   Resp 16   Ht '5\' 3"'$  (1.6 m)   Wt 86.2 kg   SpO2 100%    BMI 33.66 kg/m  Physical Exam Vitals and nursing note reviewed.  Constitutional:      General: She is not in acute distress.    Appearance: She is well-developed. She is not diaphoretic.  HENT:     Head: Normocephalic and atraumatic.     Right Ear: External ear normal.     Left Ear: External ear normal.     Mouth/Throat:     Pharynx: No oropharyngeal exudate.  Eyes:     General: No scleral icterus.    Extraocular Movements: Extraocular movements intact.     Conjunctiva/sclera: Conjunctivae normal.     Pupils: Pupils are equal, round, and reactive to light.  Neck:     Thyroid: No thyromegaly.     Vascular: No JVD.     Comments: No meningismus Cardiovascular:     Rate and Rhythm: Normal rate and regular rhythm.     Heart sounds: Normal heart sounds. No murmur heard.    No friction rub. No gallop.  Pulmonary:     Effort: Pulmonary effort is normal. No respiratory distress.     Breath sounds: Normal breath sounds.  Abdominal:     General: Bowel sounds are normal. There is no distension.     Palpations: Abdomen is soft. There is no mass.     Tenderness: There is no abdominal tenderness. There is no guarding.  Musculoskeletal:        General: No tenderness. Normal range of motion.     Cervical back: Normal range of motion and neck supple.     Comments: No meningismus  Skin:    General: Skin is warm and dry.     Findings: No rash.  Neurological:     Mental Status: She is alert and oriented to person, place, and time.     Cranial Nerves: No cranial nerve deficit.     Coordination: Coordination normal.     Deep Tendon Reflexes: Reflexes are normal and symmetric.     Comments: Speech is clear and goal oriented, follows commands Major Cranial nerves without deficit, no facial droop Normal strength in upper and lower extremities bilaterally including dorsiflexion and plantar flexion, strong and equal grip strength Sensation normal to light and sharp touch Moves extremities  without ataxia, coordination intact Normal finger to nose and rapid alternating movements Neg romberg, no pronator drift Normal gait Normal heel-shin and balance   Psychiatric:        Behavior: Behavior normal.     ED Results / Procedures / Treatments   Labs (all labs ordered are listed, but only abnormal results are displayed) Labs Reviewed - No data to display  EKG None  Radiology CT Head Wo Contrast  Result Date: 04/11/2022 CLINICAL DATA:  Headache, fever EXAM: CT HEAD WITHOUT  CONTRAST TECHNIQUE: Contiguous axial images were obtained from the base of the skull through the vertex without intravenous contrast. RADIATION DOSE REDUCTION: This exam was performed according to the departmental dose-optimization program which includes automated exposure control, adjustment of the mA and/or kV according to patient size and/or use of iterative reconstruction technique. COMPARISON:  None Available. FINDINGS: Brain: No evidence of acute infarction, hemorrhage, hydrocephalus, extra-axial collection or mass lesion/mass effect. Vascular: No hyperdense vessel or unexpected calcification. Skull: Normal. Negative for fracture or focal lesion. Sinuses/Orbits: No acute finding. IMPRESSION: No acute intracranial process. Electronically Signed   By: Sammie Bench M.D.   On: 04/11/2022 08:01    Procedures Procedures    Medications Ordered in ED Medications  sodium chloride 0.9 % bolus 500 mL (0 mLs Intravenous Stopped 04/11/22 0843)  metoCLOPramide (REGLAN) injection 5 mg (5 mg Intravenous Given 04/11/22 0737)  diphenhydrAMINE (BENADRYL) injection 12.5 mg (12.5 mg Intravenous Given 04/11/22 0737)  ketorolac (TORADOL) 30 MG/ML injection 15 mg (15 mg Intravenous Given 04/11/22 0737)  sodium chloride 0.9 % bolus 500 mL (0 mLs Intravenous Stopped 04/11/22 0914)    ED Course/ Medical Decision Making/ A&P Clinical Course as of 04/11/22 1628  Tue Apr 11, 2022  0817 CT Head Wo Contrast CT head without abnormal  finding.  I visualized and interpreted CT images. [AH]    Clinical Course User Index [AH] Margarita Mail, PA-C                             Medical Decision Making Alexx Rubiana Angelico presents with headache Given the large differential diagnosis for [facility], the decision making in this case is of high complexity.  After evaluating all of the data points in this case, the presentation of Jonell Maliya Tepe is NOT consistent with skull fracture, meningitis/encephalitis, SAH/sentinel bleed, Intracranial Hemorrhage (ICH) (subdural/epidural), acute obstructive hydrocephalus, space occupying lesions, CVA, CO Poisoning, Basilar/vertebral artery dissection, preeclampsia, cerebral venous thrombosis, hypertensive emergency, temporal Arteritis, Idiopathic Intracranial Hypertension (pseudotumor cerebri).  Strict return and follow-up precautions have been given by me personally or by detailed written instructions verbalized by nursing staff using the teach back method to patient/family/caregiver.  Data Reviewed/Counseling: I have reviewed the patient's vital signs, nursing notes, and other relevant tests/information. I had a detailed discussion regarding the historical points, exam findings, and any diagnostic results supporting the discharge diagnosis. I also discussed the need for outpatient follow-up and the need to return to the ED if symptoms worsen or if there are any questions or concerns that arise at hom    Amount and/or Complexity of Data Reviewed Independent Historian: spouse Radiology: ordered and independent interpretation performed. Decision-making details documented in ED Course.    Details: I interpreted CT head which shows no acute findings  Risk Prescription drug management. Risk Details: Patient been medications including Toradol, Reglan and Benadryl with complete resolution of headache           Final Clinical Impression(s) / ED  Diagnoses Final diagnoses:  Bad headache    Rx / DC Orders ED Discharge Orders          Ordered    ondansetron (ZOFRAN) 4 MG tablet  Every 8 hours PRN        04/11/22 0919              Margarita Mail, PA-C 04/11/22 1628    Pattricia Boss, MD 04/12/22 1444

## 2022-04-12 ENCOUNTER — Ambulatory Visit: Payer: Self-pay | Admitting: *Deleted

## 2022-04-12 ENCOUNTER — Other Ambulatory Visit: Payer: Self-pay

## 2022-04-12 ENCOUNTER — Emergency Department (HOSPITAL_COMMUNITY)
Admission: EM | Admit: 2022-04-12 | Discharge: 2022-04-12 | Disposition: A | Discharge status: Home / Self Care | Payer: Medicaid Other | Source: Home / Self Care | Attending: Emergency Medicine | Admitting: Emergency Medicine

## 2022-04-12 ENCOUNTER — Encounter (HOSPITAL_COMMUNITY): Payer: Self-pay

## 2022-04-12 DIAGNOSIS — R42 Dizziness and giddiness: Secondary | ICD-10-CM | POA: Insufficient documentation

## 2022-04-12 DIAGNOSIS — Z1152 Encounter for screening for COVID-19: Secondary | ICD-10-CM | POA: Insufficient documentation

## 2022-04-12 DIAGNOSIS — R519 Headache, unspecified: Secondary | ICD-10-CM | POA: Insufficient documentation

## 2022-04-12 DIAGNOSIS — J449 Chronic obstructive pulmonary disease, unspecified: Secondary | ICD-10-CM | POA: Insufficient documentation

## 2022-04-12 DIAGNOSIS — Z7982 Long term (current) use of aspirin: Secondary | ICD-10-CM | POA: Insufficient documentation

## 2022-04-12 DIAGNOSIS — I10 Essential (primary) hypertension: Secondary | ICD-10-CM | POA: Insufficient documentation

## 2022-04-12 DIAGNOSIS — Z79899 Other long term (current) drug therapy: Secondary | ICD-10-CM | POA: Insufficient documentation

## 2022-04-12 LAB — RESP PANEL BY RT-PCR (RSV, FLU A&B, COVID)  RVPGX2
Influenza A by PCR: NEGATIVE
Influenza B by PCR: NEGATIVE
Resp Syncytial Virus by PCR: NEGATIVE
SARS Coronavirus 2 by RT PCR: NEGATIVE

## 2022-04-12 MED ORDER — MECLIZINE HCL 25 MG PO TABS
25.0000 mg | ORAL_TABLET | Freq: Once | ORAL | Status: AC
  Administered 2022-04-12: 25 mg via ORAL
  Filled 2022-04-12: qty 1

## 2022-04-12 MED ORDER — IBUPROFEN 800 MG PO TABS
800.0000 mg | ORAL_TABLET | Freq: Once | ORAL | Status: AC
  Administered 2022-04-12: 800 mg via ORAL
  Filled 2022-04-12: qty 1

## 2022-04-12 MED ORDER — AMOXICILLIN-POT CLAVULANATE 875-125 MG PO TABS
1.0000 | ORAL_TABLET | Freq: Once | ORAL | Status: AC
  Administered 2022-04-12: 1 via ORAL
  Filled 2022-04-12: qty 1

## 2022-04-12 NOTE — ED Triage Notes (Signed)
Pt seen here yesterday, seen for migraines, received migraine cocktail with relief; around 1900 last night HA began again, states hearing out of R ear muffled today; denies nausea; endorses dizziness; pt has not taken BP meds today

## 2022-04-12 NOTE — Telephone Encounter (Signed)
Patient is currently in the ED.

## 2022-04-12 NOTE — ED Notes (Signed)
Walking around the corner and saw the pt and her visitor walking out.  They stated they could look at her mychart and see the results of testing they had to go to make another appointment

## 2022-04-12 NOTE — ED Notes (Signed)
[place] provided to family

## 2022-04-12 NOTE — Telephone Encounter (Signed)
  Chief Complaint: headache not relieved by Excedrin and benadryl as recommended in ED.  Symptoms: right side of face, ear, neck pain throbbing. Reports headache came back middle of night after being seen in ED yesterday. Pain behind right eye. No relief of pain 2 hours after benadryl and Excedrin. Lightheaded with standing  Frequency: middle of night  Pertinent Negatives: Patient denies wost headache of life. No difficulty breathing or chest pain reported.  Disposition: '[]'$ ED /'[]'$ Urgent Care (no appt availability in office) / '[]'$ Appointment(In office/virtual)/ '[]'$  [facility]/ '[]'$ Home Care/ '[]'$ Refused Recommended Disposition /'[x]'$  Mobile Bus/ '[]'$  Follow-up with PCP Additional Notes:   Recommended to be seen within 4 hours at mobile bus or ED if pain worsening. Recommended to call 911 if sx worsen.     Reason for Disposition  [1] SEVERE headache (e.g., excruciating) AND [2] not improved after 2 hours of pain medicine  Answer Assessment - Initial Assessment Questions 1. LOCATION: "Where does it hurt?"      Right side of face ear, neck 2. ONSET: "When did the headache start?" (Minutes, hours or days)      Yesterday and seen in ED yesterday  3. PATTERN: "Does the pain come and go, or has it been constant since it started?"     Na  4. SEVERITY: "How bad is the pain?" and "What does it keep you from doing?"  (e.g., Scale 1-10; mild, moderate, or severe)   - MILD (1-3): doesn't interfere with normal activities    - MODERATE (4-7): interferes with normal activities or awakens from sleep    - SEVERE (8-10): excruciating pain, unable to do any normal activities        Able to do normal activities.  5. RECURRENT SYMPTOM: "Have you ever had headaches before?" If Yes, ask: "When was the last time?" and "What happened that time?"      Yes  6. CAUSE: "What do you think is causing the headache?"     Not sure  7. MIGRAINE: "Have you been diagnosed with migraine headaches?" If Yes, ask:  "Is this headache similar?"      Yes  8. HEAD INJURY: "Has there been any recent injury to the head?"      na 9. OTHER SYMPTOMS: "Do you have any other symptoms?" (fever, stiff neck, eye pain, sore throat, cold symptoms)     Right neck, ear , face throbbing. Ear soreness  10. PREGNANCY: "Is there any chance you are pregnant?" "When was your last menstrual period?"       na  Protocols used: [facility]

## 2022-04-12 NOTE — ED Provider Notes (Signed)
[facility] Provider Note   CSN: [id number] Arrival date & time: 04/12/22  1217     History {Add pertinent medical, surgical, social history, OB history to HPI:1} No chief complaint on file.   Catherine Kane is a 52 y.o. female.  The history is provided by the patient and medical records. No language interpreter was used.     52 year old female sitting history of anxiety, hypertension, COPD presenting complaining of headache.  Patient report for the past 4 to 5 days she has had headache for which she described as a pressure behind her right eye.  She went to the ER yesterday for headache.  She had a normal head CT scan and was giving a migraine cocktail which did provide some relief.  She returned home but states headache returned again last night prompting this ER visit.  Described the same pressure headache behind her right eye and now radiates towards her right ear with pain when she lays on the affected ear.  She also complaining of some dizziness with room spinning sensation worse with positional change improved with the rest.  She denies fever chills no runny nose sneezing or coughing no sore throat.  She denies any rash.  She did mention recently several family members did get sick with COVID.  Home Medications Prior to Admission medications   Medication Sig Start Date End Date Taking? Authorizing Provider  albuterol (VENTOLIN HFA) 108 (90 Base) MCG/ACT inhaler Inhale 2 puffs into the lungs every 6 (six) hours as needed for wheezing or shortness of breath. 03/21/22   Elsie Stain, MD  amLODipine (NORVASC) 10 MG tablet Take 1 tablet (10 mg total) by mouth daily. 11/01/21   Elsie Stain, MD  aspirin EC 81 MG tablet Take 81 mg by mouth daily.    [provider]  atorvastatin (LIPITOR) 10 MG tablet Take 1 tablet (10 mg total) by mouth daily. 11/02/21   Elsie Stain, MD  benzonatate (TESSALON) 100 MG capsule  Take 1 capsule (100 mg total) by mouth 3 (three) times daily as needed for cough. 01/27/22   Elsie Stain, MD  carvedilol (COREG) 12.5 MG tablet Take 1 tablet (12.5 mg total) by mouth 2 (two) times daily with a meal. 11/01/21   Elsie Stain, MD  chlorpheniramine (CHLOR-TRIMETON) 4 MG tablet Take 4 mg by mouth every 4 (four) hours as needed for allergies.    [provider]  diclofenac Sodium (VOLTAREN) 1 % GEL Apply 2 g topically 4 (four) times daily. To painful joints 11/01/21   Elsie Stain, MD  ergocalciferol (VITAMIN D2) 1.25 MG (50000 UT) capsule Take 1 capsule (50,000 Units total) by mouth every 7 (seven) days. 11/01/21   Elsie Stain, MD  Ferrous Sulfate (IRON) 325 (65 Fe) MG TABS Take 1 tablet by mouth daily.    [provider]  FLUoxetine (PROZAC) 40 MG capsule Take 1 capsule (40 mg total) by mouth daily. 11/01/21   Elsie Stain, MD  hydrochlorothiazide (HYDRODIURIL) 25 MG tablet TAKE 1 TABLET (25 MG TOTAL) BY MOUTH DAILY. Patient not taking: Reported on 11/01/2021 09/08/19 09/07/20  Azzie Glatter, FNP  montelukast (SINGULAIR) 10 MG tablet Take 1 tablet (10 mg total) by mouth at bedtime. 11/01/21   Elsie Stain, MD  ondansetron (ZOFRAN) 4 MG tablet Take 1 tablet (4 mg total) by mouth every 8 (eight) hours as needed for nausea or vomiting. 04/11/22   Kenton Kingfisher,  Abigail, PA-C  pantoprazole (PROTONIX) 40 MG tablet Take 1 tablet (40 mg total) by mouth daily. Take 30-60 minutes before the first meal of the day. 11/01/21   Elsie Stain, MD  potassium chloride (KLOR-CON) 10 MEQ tablet Take 1 tablet (10 mEq total) by mouth 2 (two) times daily. 11/02/21   Elsie Stain, MD  Respiratory Therapy Supplies (FLUTTER) DEVI Use as directed Patient not taking: Reported on 11/01/2021 01/22/19   Tanda Rockers, MD  traZODone (DESYREL) 100 MG tablet Take 1 tablet (100 mg total) by mouth at bedtime. 11/01/21   Elsie Stain, MD  valsartan-hydrochlorothiazide  (DIOVAN-HCT) 320-25 MG tablet Take 1 tablet by mouth daily. 11/01/21   Elsie Stain, MD      Allergies    Grass pollen(k-o-r-[id number]-swt vern)    Review of Systems   Review of Systems  All other systems reviewed and are negative.   Physical Exam Updated Vital Signs BP (!) 162/100 (BP Location: Right Arm)   Pulse 68   Temp 98.7 F (37.1 C) (Oral)   Resp 17   SpO2 99%  Physical Exam Vitals and nursing note reviewed.  Constitutional:      General: She is not in acute distress.    Appearance: She is well-developed.  HENT:     Head: Normocephalic and atraumatic.     Ears:     Comments: Right ear: Right TM is erythematous and mildly bulging with faint effusion.  Normal ear canal and no pain with manipulation of the earlobe Left ear: Normal TM, normal ear canal    Nose: Nose normal.     Mouth/Throat:     Mouth: Mucous membranes are moist.  Eyes:     Conjunctiva/sclera: Conjunctivae normal.  Neck:     Vascular: No carotid bruit.     Comments: No carotid bruit or pulsatile mass Cardiovascular:     Rate and Rhythm: Normal rate and regular rhythm.  Pulmonary:     Effort: Pulmonary effort is normal.  Abdominal:     Palpations: Abdomen is soft.     Tenderness: There is no abdominal tenderness.  Musculoskeletal:     Cervical back: Normal range of motion and neck supple. No rigidity or tenderness.  Lymphadenopathy:     Cervical: No cervical adenopathy.  Skin:    Findings: No rash.  Neurological:     General: No focal deficit present.     Mental Status: She is alert.  Psychiatric:        Mood and Affect: Mood normal.     ED Results / Procedures / Treatments   Labs (all labs ordered are listed, but only abnormal results are displayed) Labs Reviewed - No data to display  EKG None  Radiology CT Head Wo Contrast  Result Date: 04/11/2022 CLINICAL DATA:  Headache, fever EXAM: CT HEAD WITHOUT CONTRAST TECHNIQUE: Contiguous axial images were obtained from the base of the  skull through the vertex without intravenous contrast. RADIATION DOSE REDUCTION: This exam was performed according to the departmental dose-optimization program which includes automated exposure control, adjustment of the mA and/or kV according to patient size and/or use of iterative reconstruction technique. COMPARISON:  None Available. FINDINGS: Brain: No evidence of acute infarction, hemorrhage, hydrocephalus, extra-axial collection or mass lesion/mass effect. Vascular: No hyperdense vessel or unexpected calcification. Skull: Normal. Negative for fracture or focal lesion. Sinuses/Orbits: No acute finding. IMPRESSION: No acute intracranial process. Electronically Signed   By: Sammie Bench M.D.   On: 04/11/2022 08:01  Procedures Procedures  {Document cardiac monitor, telemetry assessment procedure when appropriate:1}  Medications Ordered in ED Medications - No data to display  ED Course/ Medical Decision Making/ A&P   {   Click here for ABCD2, HEART and other calculatorsREFRESH Note before signing :1}                          Medical Decision Making  BP (!) 162/100 (BP Location: Right Arm)   Pulse 68   Temp 98.7 F (37.1 C) (Oral)   Resp 17   SpO2 99%   8:43 PM 52 year old female sitting history of anxiety, hypertension, COPD presenting complaining of headache.  Patient report for the past 4 to 5 days she has had headache for which she described as a pressure behind her right eye.  She went to the ER yesterday for headache.  She had a normal head CT scan and was giving a migraine cocktail which did provide some relief.  She returned home but states headache returned again last night prompting this ER visit.  Described the same pressure headache behind her right eye and now radiates towards her right ear with pain when she lays on the affected ear.  She also complaining of some dizziness with room spinning sensation worse with positional change improved with the rest.  She denies fever  chills no runny nose sneezing or coughing no sore throat.  She denies any rash.  She did mention recently several family members did get sick with COVID.  On exam, patient is laying in bed appears to be in no acute discomfort.  She does not have any nuchal rigidity.  Right TM is erythematous with some mild effusion concerning for otitis media.  No canal swelling or pain with earlobe manipulation to suggest otitis externa.  She does not have any significant tenderness about her neck no carotid bruit or pulsatile mass concerning for vertebral artery dissection or carotid problem.  Since patient has recent been exposed to family member with COVID, will obtain viral respiratory panel.  Her dizziness is likely related to some vestibular problem in the setting of ear infection.  Ibuprofen and meclizine given for symptoms.  {Document critical care time when appropriate:1} {Document review of labs and clinical decision tools ie heart score, Chads2Vasc2 etc:1}  {Document your independent review of radiology images, and any outside records:1} {Document your discussion with family members, caretakers, and with consultants:1} {Document social determinants of health affecting pt's care:1} {Document your decision making why or why not admission, treatments were needed:1} Final Clinical Impression(s) / ED Diagnoses Final diagnoses:  None    Rx / DC Orders ED Discharge Orders     None

## 2022-04-12 NOTE — ED Provider Triage Note (Cosign Needed)
Emergency Medicine Provider Triage Evaluation Note  Mariaelena Nakiya Utterback , a 52 y.o. female  was evaluated in triage.  Pt complains of headache, right ear pain.  Was seen here for same yesterday.  Was given headache cocktail and reported improvement in his symptoms until approximately 7 PM last night.  The headache then returned.  It is localized to the right eye.  She took Excedrin and Benadryl 3 hours ago with minimal improvement in her symptoms.  Also reports dizziness and lightheadedness when changing positions.  Describes it as the room spinning.  Goes away with rest.  Denies nausea, vomiting, fevers, chills, numbness, weakness, tingling  Review of Systems  Positive: As above Negative: As above  Physical Exam  BP (!) 162/100 (BP Location: Right Arm)   Pulse 68   Temp 98.7 F (37.1 C) (Oral)   Resp 17   SpO2 99%  Gen:   Awake, no distress, resting comfortably in chair Resp:  Normal effort  MSK:   Moves extremities without difficulty  Other:    Medical Decision Making  Medically screening exam initiated at 1:06 PM.  Appropriate orders placed.  Hattye Gustavia Gleim was informed that the remainder of the evaluation will be completed by another provider, this initial triage assessment does not replace that evaluation, and the importance of remaining in the ED until their evaluation is complete.  Had CT scan yesterday which was negative   Roylene Reason, [place] 04/12/22 1307

## 2022-04-13 ENCOUNTER — Emergency Department (HOSPITAL_COMMUNITY): Payer: Medicaid Other

## 2022-04-13 ENCOUNTER — Other Ambulatory Visit: Payer: Self-pay

## 2022-04-13 ENCOUNTER — Encounter (HOSPITAL_COMMUNITY): Payer: Self-pay

## 2022-04-13 ENCOUNTER — Telehealth: Payer: Self-pay | Admitting: Critical Care Medicine

## 2022-04-13 ENCOUNTER — Inpatient Hospital Stay (HOSPITAL_COMMUNITY)
Admission: EM | Admit: 2022-04-13 | Discharge: 2022-04-19 | DRG: 064 | Disposition: A | Discharge status: Home / Self Care | Payer: Medicaid Other | Attending: Internal Medicine | Admitting: Internal Medicine

## 2022-04-13 DIAGNOSIS — I10 Essential (primary) hypertension: Secondary | ICD-10-CM | POA: Diagnosis present

## 2022-04-13 DIAGNOSIS — E785 Hyperlipidemia, unspecified: Secondary | ICD-10-CM | POA: Diagnosis present

## 2022-04-13 DIAGNOSIS — Z79899 Other long term (current) drug therapy: Secondary | ICD-10-CM

## 2022-04-13 DIAGNOSIS — J4489 Other specified chronic obstructive pulmonary disease: Secondary | ICD-10-CM | POA: Diagnosis present

## 2022-04-13 DIAGNOSIS — Z716 Tobacco abuse counseling: Secondary | ICD-10-CM

## 2022-04-13 DIAGNOSIS — Z7982 Long term (current) use of aspirin: Secondary | ICD-10-CM

## 2022-04-13 DIAGNOSIS — I639 Cerebral infarction, unspecified: Secondary | ICD-10-CM | POA: Diagnosis present

## 2022-04-13 DIAGNOSIS — F1410 Cocaine abuse, uncomplicated: Secondary | ICD-10-CM | POA: Diagnosis present

## 2022-04-13 DIAGNOSIS — F39 Unspecified mood [affective] disorder: Secondary | ICD-10-CM | POA: Diagnosis present

## 2022-04-13 DIAGNOSIS — F419 Anxiety disorder, unspecified: Secondary | ICD-10-CM | POA: Diagnosis present

## 2022-04-13 DIAGNOSIS — E559 Vitamin D deficiency, unspecified: Secondary | ICD-10-CM | POA: Diagnosis present

## 2022-04-13 DIAGNOSIS — R29714 NIHSS score 14: Secondary | ICD-10-CM | POA: Diagnosis present

## 2022-04-13 DIAGNOSIS — E538 Deficiency of other specified B group vitamins: Secondary | ICD-10-CM | POA: Diagnosis present

## 2022-04-13 DIAGNOSIS — I636 Cerebral infarction due to cerebral venous thrombosis, nonpyogenic: Principal | ICD-10-CM | POA: Diagnosis present

## 2022-04-13 DIAGNOSIS — R4701 Aphasia: Secondary | ICD-10-CM | POA: Diagnosis present

## 2022-04-13 DIAGNOSIS — Z8249 Family history of ischemic heart disease and other diseases of the circulatory system: Secondary | ICD-10-CM

## 2022-04-13 DIAGNOSIS — D6859 Other primary thrombophilia: Secondary | ICD-10-CM | POA: Diagnosis present

## 2022-04-13 DIAGNOSIS — F17200 Nicotine dependence, unspecified, uncomplicated: Secondary | ICD-10-CM | POA: Diagnosis present

## 2022-04-13 DIAGNOSIS — R569 Unspecified convulsions: Secondary | ICD-10-CM | POA: Diagnosis not present

## 2022-04-13 DIAGNOSIS — G8191 Hemiplegia, unspecified affecting right dominant side: Secondary | ICD-10-CM | POA: Diagnosis present

## 2022-04-13 DIAGNOSIS — I8290 Acute embolism and thrombosis of unspecified vein: Secondary | ICD-10-CM | POA: Diagnosis not present

## 2022-04-13 DIAGNOSIS — I609 Nontraumatic subarachnoid hemorrhage, unspecified: Principal | ICD-10-CM | POA: Diagnosis present

## 2022-04-13 DIAGNOSIS — G08 Intracranial and intraspinal phlebitis and thrombophlebitis: Secondary | ICD-10-CM | POA: Diagnosis not present

## 2022-04-13 DIAGNOSIS — I6389 Other cerebral infarction: Secondary | ICD-10-CM | POA: Diagnosis not present

## 2022-04-13 DIAGNOSIS — Z1152 Encounter for screening for COVID-19: Secondary | ICD-10-CM | POA: Diagnosis not present

## 2022-04-13 DIAGNOSIS — K219 Gastro-esophageal reflux disease without esophagitis: Secondary | ICD-10-CM | POA: Diagnosis present

## 2022-04-13 DIAGNOSIS — Z8616 Personal history of COVID-19: Secondary | ICD-10-CM

## 2022-04-13 DIAGNOSIS — Z803 Family history of malignant neoplasm of breast: Secondary | ICD-10-CM

## 2022-04-13 DIAGNOSIS — E876 Hypokalemia: Secondary | ICD-10-CM | POA: Diagnosis present

## 2022-04-13 DIAGNOSIS — Z6841 Body Mass Index (BMI) 40.0 and over, adult: Secondary | ICD-10-CM

## 2022-04-13 DIAGNOSIS — E860 Dehydration: Secondary | ICD-10-CM | POA: Diagnosis present

## 2022-04-13 DIAGNOSIS — I6310 Cerebral infarction due to embolism of unspecified precerebral artery: Secondary | ICD-10-CM

## 2022-04-13 DIAGNOSIS — R001 Bradycardia, unspecified: Secondary | ICD-10-CM | POA: Diagnosis present

## 2022-04-13 DIAGNOSIS — I161 Hypertensive emergency: Secondary | ICD-10-CM | POA: Diagnosis present

## 2022-04-13 HISTORY — DX: Cerebral infarction, unspecified: I63.9

## 2022-04-13 LAB — DIFFERENTIAL
Abs Immature Granulocytes: 0.02 10*3/uL (ref 0.00–0.07)
Basophils Absolute: 0.1 10*3/uL (ref 0.0–0.1)
Basophils Relative: 1 %
Eosinophils Absolute: 0 10*3/uL (ref 0.0–0.5)
Eosinophils Relative: 0 %
Immature Granulocytes: 0 %
Lymphocytes Relative: 28 %
Lymphs Abs: 2.4 10*3/uL (ref 0.7–4.0)
Monocytes Absolute: 0.8 10*3/uL (ref 0.1–1.0)
Monocytes Relative: 9 %
Neutro Abs: 5.2 10*3/uL (ref 1.7–7.7)
Neutrophils Relative %: 62 %

## 2022-04-13 LAB — URINALYSIS, ROUTINE W REFLEX MICROSCOPIC
Bilirubin Urine: NEGATIVE
Glucose, UA: NEGATIVE mg/dL
Hgb urine dipstick: NEGATIVE
Ketones, ur: NEGATIVE mg/dL
Leukocytes,Ua: NEGATIVE
Nitrite: NEGATIVE
Protein, ur: NEGATIVE mg/dL
Specific Gravity, Urine: 1.025 (ref 1.005–1.030)
pH: 7 (ref 5.0–8.0)

## 2022-04-13 LAB — COMPREHENSIVE METABOLIC PANEL
ALT: 24 U/L (ref 0–44)
AST: 27 U/L (ref 15–41)
Albumin: 4.1 g/dL (ref 3.5–5.0)
Alkaline Phosphatase: 84 U/L (ref 38–126)
Anion gap: 11 (ref 5–15)
BUN: 5 mg/dL — ABNORMAL LOW (ref 6–20)
CO2: 24 mmol/L (ref 22–32)
Calcium: 9.2 mg/dL (ref 8.9–10.3)
Chloride: 99 mmol/L (ref 98–111)
Creatinine, Ser: 0.78 mg/dL (ref 0.44–1.00)
GFR, Estimated: >60 mL/min (ref >60)
Glucose, Bld: 107 mg/dL — ABNORMAL HIGH (ref 70–99)
Potassium: 2.7 mmol/L — CL (ref 3.5–5.1)
Sodium: 134 mmol/L — ABNORMAL LOW (ref 135–145)
Total Bilirubin: 0.5 mg/dL (ref 0.3–1.2)
Total Protein: 8.2 g/dL — ABNORMAL HIGH (ref 6.5–8.1)

## 2022-04-13 LAB — I-STAT CHEM 8, ED
BUN: 6 mg/dL (ref 6–20)
Calcium, Ion: 1 mmol/L — ABNORMAL LOW (ref 1.15–1.40)
Chloride: 100 mmol/L (ref 98–111)
Creatinine, Ser: 0.6 mg/dL (ref 0.44–1.00)
Glucose, Bld: 105 mg/dL — ABNORMAL HIGH (ref 70–99)
HCT: 45 % (ref 36.0–46.0)
Hemoglobin: 15.3 g/dL — ABNORMAL HIGH (ref 12.0–15.0)
Potassium: 2.7 mmol/L — CL (ref 3.5–5.1)
Sodium: 138 mmol/L (ref 135–145)
TCO2: 24 mmol/L (ref 22–32)

## 2022-04-13 LAB — RAPID URINE DRUG SCREEN, HOSP PERFORMED
Amphetamines: NOT DETECTED
Barbiturates: NOT DETECTED
Benzodiazepines: NOT DETECTED
Cocaine: POSITIVE — AB
Opiates: NOT DETECTED
Tetrahydrocannabinol: NOT DETECTED

## 2022-04-13 LAB — CBC
HCT: 43.7 % (ref 36.0–46.0)
Hemoglobin: 14.9 g/dL (ref 12.0–15.0)
MCH: 28.9 pg (ref 26.0–34.0)
MCHC: 34.1 g/dL (ref 30.0–36.0)
MCV: 84.7 fL (ref 80.0–100.0)
Platelets: 326 10*3/uL (ref 150–400)
RBC: 5.16 MIL/uL — ABNORMAL HIGH (ref 3.87–5.11)
RDW: 12.7 % (ref 11.5–15.5)
WBC: 8.4 10*3/uL (ref 4.0–10.5)
nRBC: 0 % (ref 0.0–0.2)

## 2022-04-13 LAB — PROTIME-INR
INR: 1.1 (ref 0.8–1.2)
Prothrombin Time: 13.9 seconds (ref 11.4–15.2)

## 2022-04-13 LAB — I-STAT BETA HCG BLOOD, ED (MC, WL, AP ONLY): I-stat hCG, quantitative: <5 m[IU]/mL (ref <5)

## 2022-04-13 LAB — ETHANOL: Alcohol, Ethyl (B): <10 mg/dL (ref <10)

## 2022-04-13 LAB — APTT: aPTT: 30 seconds (ref 24–36)

## 2022-04-13 LAB — CBG MONITORING, ED: Glucose-Capillary: 101 mg/dL — ABNORMAL HIGH (ref 70–99)

## 2022-04-13 LAB — ANTITHROMBIN III: AntiThromb III Func: 99 % (ref 75–120)

## 2022-04-13 MED ORDER — CLEVIDIPINE BUTYRATE 0.5 MG/ML IV EMUL
0.0000 mg/h | INTRAVENOUS | Status: DC
  Administered 2022-04-14: 2 mg/h via INTRAVENOUS
  Filled 2022-04-13: qty 50

## 2022-04-13 MED ORDER — CEFDINIR 300 MG PO CAPS
300.0000 mg | ORAL_CAPSULE | Freq: Two times a day (BID) | ORAL | 0 refills | Status: DC
  Filled 2022-04-13: qty 14, 7d supply, fill #0

## 2022-04-13 MED ORDER — POLYETHYLENE GLYCOL 3350 17 G PO PACK: 17.0000 g | PACK | Freq: Every day | ORAL | Status: DC | PRN

## 2022-04-13 MED ORDER — DOCUSATE SODIUM 100 MG PO CAPS: 100.0000 mg | ORAL_CAPSULE | Freq: Two times a day (BID) | ORAL | Status: DC | PRN

## 2022-04-13 MED ORDER — LEVETIRACETAM IN NACL 1500 MG/100ML IV SOLN
3000.0000 mg | Freq: Once | INTRAVENOUS | Status: AC
  Administered 2022-04-13: 3000 mg via INTRAVENOUS
  Filled 2022-04-13: qty 200

## 2022-04-13 MED ORDER — LORAZEPAM 2 MG/ML IJ SOLN
2.0000 mg | Freq: Once | INTRAMUSCULAR | Status: AC
  Administered 2022-04-13: 2 mg via INTRAVENOUS
  Filled 2022-04-13: qty 1

## 2022-04-13 MED ORDER — IOHEXOL 350 MG/ML SOLN
75.0000 mL | Freq: Once | INTRAVENOUS | Status: AC | PRN
  Administered 2022-04-13: 75 mL via INTRAVENOUS

## 2022-04-13 MED ORDER — LEVETIRACETAM IN NACL 500 MG/100ML IV SOLN
500.0000 mg | Freq: Two times a day (BID) | INTRAVENOUS | Status: DC
  Administered 2022-04-14: 500 mg via INTRAVENOUS
  Filled 2022-04-13: qty 100

## 2022-04-13 MED ORDER — HEPARIN (PORCINE) 25000 UT/250ML-% IV SOLN
1300.0000 [IU]/h | INTRAVENOUS | Status: AC
  Administered 2022-04-13: 850 [IU]/h via INTRAVENOUS
  Administered 2022-04-14: 1300 [IU]/h via INTRAVENOUS
  Administered 2022-04-15 – 2022-04-16 (×2): 1350 [IU]/h via INTRAVENOUS
  Filled 2022-04-13 (×4): qty 250

## 2022-04-13 NOTE — Plan of Care (Signed)
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  This is also not a true primary ICH of any sort.  -- Amie Portland, MD Neurologist [facility] Pager: [phone]

## 2022-04-13 NOTE — Progress Notes (Signed)
ANTICOAGULATION CONSULT NOTE - Initial Consult  Pharmacy Consult for heparin Indication:  dural venous sinus thrombosis  Allergies  Allergen Reactions   Grass Pollen(K-O-R-T-Swt Vern)    Patient Measurements:  Actual body weight: 86.2 kg IBW: 52.4 kg Heparin Dosing Weight:   71.7 kg  Vital Signs: BP: 189/110 (03/07 2130) Pulse Rate: 76 (03/07 2140)  Labs: Recent Labs    04/13/22 2035 04/13/22 2041  HGB 14.9 15.3*  HCT 43.7 45.0  PLT 326  --   APTT 30  --   LABPROT 13.9  --   INR 1.1  --   CREATININE 0.78 0.60   Estimated Creatinine Clearance: 88.5 mL/min (by C-G formula based on SCr of 0.6 mg/dL).  Medical History: Past Medical History:  Diagnosis Date   Anxiety 09/2019   Asthma    Bronchitis    Coronavirus infection 12/22/2018   Hypertension    Insomnia    Vitamin B12 deficiency 09/2019   Vitamin D deficiency 10/2018   Well woman exam with routine gynecological exam 11/26/2018   Assessment: TT is a 52 yo female admitted for stroke-like symptoms. 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 Pharmacy consulted for heparin dosing for stroke protocol. Hgb 15.3, plt 326. Will not give bolus and start heparin at lower end of dosing range due to subarachnoid hemorrhage.   Goal of Therapy:  Heparin level 0.3-0.5 Monitor platelets by anticoagulation protocol: Yes   Plan:  Start heparin infusion at 850 units/hr Check anti-Xa level in 6 hours and daily while on heparin Continue to monitor H&H and platelets  Jeneen Rinks [id number] PM

## 2022-04-13 NOTE — Telephone Encounter (Signed)
Viral panel negative for covid flu rsv  Exam showed otitis media in ears  Should have stayed they would have Rx ABX  I sent Rx to our pharmacy for cefdinir  She will need f/u prob with angela in next two weeks

## 2022-04-13 NOTE — Telephone Encounter (Signed)
Patient was seen yesterday at the ED for migraines and ear pain. Patient was told she had an ear infection and was supposed to be prescribed amoxicillin. Patient says the ED doctor never ordered the prescription so she wasn't able to pick it up. Patient wants to know if Dr. Joya Gaskins could prescribe her an antibiotic. Patient has an appointment on 05/09/22 and says she cannot wait that long. There are no available appointments sooner. Please advise.

## 2022-04-13 NOTE — Telephone Encounter (Signed)
Pt. Reports she was seen in ED yesterday and they "were going to put me on an antibiotic for my ear infection. I had to leave before they discharged me." States they did not call in antibiotic and request PCP call something in for her. Please advise pt.

## 2022-04-13 NOTE — Consult Note (Addendum)
Neurology Consultation  Reason for Consult: speech difficulty, right sided numbness and weakness Referring Physician: Dr Ronnald Nian  CC: headache, speech difficulty, right sided numbness  History is obtained from: Patient, husband, chart  HPI: Catherine Kane is a 52 y.o. female past medical history of anxiety hypertension vitamin B and D deficiency presented to the emergency room for evaluation of ongoing headache for the past 3 days.  She has been seen twice in the ER over the past couple of days, CT head was done yesterday and read unremarkable and sent home with concerns for possible ear infection and antibiotics for treatment.  Her symptoms remained persistent and today while watching TV sometime this evening around 8 PM, she started having some numbness and heaviness in her right arm.  Her husband brought her to the emergency room for emergent evaluation.  The ED provider called me to discuss this case.  I asked him to activate a code stroke since the right-sided symptoms were new although the headache has been going on for 3 days.  She was taken straight to the CT scanner where there was concern for left parietal subarachnoid hemorrhage and possible dural venous sinus thrombus.  I also took her for an MRI brain to make sure that there is no large infarction.  This was followed by CT angiogram and CT venogram's-see details below.   LKW: 3 days ago IV thrombolysis given?: no, subarachnoid hemorrhage, dural venous sinus thrombus EVT: Discussed venous thrombectomy with neurointerventional radiology but does not look like she has amenable lesions for venous thrombectomy Premorbid modified Rankin scale (mRS): 0  ROS: Full ROS was performed and is negative except as noted in the HPI.   Past Medical History:  Diagnosis Date   Anxiety 09/2019   Asthma    Bronchitis    Coronavirus infection 12/22/2018   Hypertension    Insomnia    Vitamin B12 deficiency 09/2019   Vitamin D  deficiency 10/2018   Well woman exam with routine gynecological exam 11/26/2018     Family History  Problem Relation Age of Onset   Heart disease Mother    Heart disease Sister    Breast cancer Maternal Aunt    Breast cancer Maternal Grandmother      Social History:   reports that she has never smoked. She has never used smokeless tobacco. She reports current alcohol use of about 1.0 standard drink of alcohol per week. She reports that she does not use drugs.  Medications No current facility-administered medications for this encounter.  Current Outpatient Medications:    albuterol (VENTOLIN HFA) 108 (90 Base) MCG/ACT inhaler, Inhale 2 puffs into the lungs every 6 (six) hours as needed for wheezing or shortness of breath., Disp: 18 g, Rfl: 3   amLODipine (NORVASC) 10 MG tablet, Take 1 tablet (10 mg total) by mouth daily., Disp: 30 tablet, Rfl: 11   aspirin EC 81 MG tablet, Take 81 mg by mouth daily., Disp: , Rfl:    atorvastatin (LIPITOR) 10 MG tablet, Take 1 tablet (10 mg total) by mouth daily., Disp: 90 tablet, Rfl: 3   benzonatate (TESSALON) 100 MG capsule, Take 1 capsule (100 mg total) by mouth 3 (three) times daily as needed for cough., Disp: 21 capsule, Rfl: 0   carvedilol (COREG) 12.5 MG tablet, Take 1 tablet (12.5 mg total) by mouth 2 (two) times daily with a meal., Disp: 60 tablet, Rfl: 3   cefdinir (OMNICEF) 300 MG capsule, Take 1 capsule (300 mg total) by  mouth 2 (two) times daily., Disp: 14 capsule, Rfl: 0   chlorpheniramine (CHLOR-TRIMETON) 4 MG tablet, Take 4 mg by mouth every 4 (four) hours as needed for allergies., Disp: , Rfl:    diclofenac Sodium (VOLTAREN) 1 % GEL, Apply 2 g topically 4 (four) times daily. To painful joints, Disp: 100 g, Rfl: 1   ergocalciferol (VITAMIN D2) 1.25 MG (50000 UT) capsule, Take 1 capsule (50,000 Units total) by mouth every 7 (seven) days., Disp: 11 capsule, Rfl: 6   Ferrous Sulfate (IRON) 325 (65 Fe) MG TABS, Take 1 tablet by mouth daily.,  Disp: , Rfl:    FLUoxetine (PROZAC) 40 MG capsule, Take 1 capsule (40 mg total) by mouth daily., Disp: 30 capsule, Rfl: 11   hydrochlorothiazide (HYDRODIURIL) 25 MG tablet, TAKE 1 TABLET (25 MG TOTAL) BY MOUTH DAILY. (Patient not taking: Reported on 11/01/2021), Disp: 30 tablet, Rfl: 11   montelukast (SINGULAIR) 10 MG tablet, Take 1 tablet (10 mg total) by mouth at bedtime., Disp: 30 tablet, Rfl: 11   ondansetron (ZOFRAN) 4 MG tablet, Take 1 tablet (4 mg total) by mouth every 8 (eight) hours as needed for nausea or vomiting., Disp: 10 tablet, Rfl: 0   pantoprazole (PROTONIX) 40 MG tablet, Take 1 tablet (40 mg total) by mouth daily. Take 30-60 minutes before the first meal of the day., Disp: 30 tablet, Rfl: 11   potassium chloride (KLOR-CON) 10 MEQ tablet, Take 1 tablet (10 mEq total) by mouth 2 (two) times daily., Disp: 90 tablet, Rfl: 2   Respiratory Therapy Supplies (FLUTTER) DEVI, Use as directed (Patient not taking: Reported on 11/01/2021), Disp: 1 each, Rfl: 0   traZODone (DESYREL) 100 MG tablet, Take 1 tablet (100 mg total) by mouth at bedtime., Disp: 30 tablet, Rfl: 6   valsartan-hydrochlorothiazide (DIOVAN-HCT) 320-25 MG tablet, Take 1 tablet by mouth daily., Disp: 60 tablet, Rfl: 3   Exam: Current vital signs: BP (!) 222/124 (BP Location: Right Arm)   Pulse 97   Resp 16   SpO2 100%  Vital signs in last 24 hours: Pulse Rate:  [97] 97 (03/07 2025) Resp:  [16] 16 (03/07 2025) BP: (222)/(124) 222/124 (03/07 2025) SpO2:  [100 %] 100 % (03/07 2025) General: Awake alert no distress HNT: Normocephalic atraumatic Lungs: Clear Cardiovascular: Regular rhythm Abdomen nondistended nontender Extremities warm well-perfused Neurological exam She is awake alert She will follow simple commands She is nearly mute On occasion she is able to speak with a very dysarthric tone. Mild facial twitching off and on Cranial nerves II to XII: Pupils equal round react light, extraocular movements intact,  visual fields appear full, face appears grossly symmetric with mild twitching off-and-on seen during the encounter.  Tongue and palate midline. Motor examination with nearly flaccid right upper and lower extremity with barely 1/5 strength in both right upper and lower extremities.  Left side full strength. Sensation diminished on the right compared to the left Coordination difficult to assess but no gross dysmetria on the left.  Unable to assess on the right.   NIHSS 1a Level of Conscious.: 0 1b LOC Questions: 2 1c LOC Commands: 0 2 Best Gaze: 0 3 Visual: 0 4 Facial Palsy: 0 5a Motor Arm - left: 0 5b Motor Arm - Right: 3 6a Motor Leg - Left: 0 6b Motor Leg - Right: 3 7 Limb Ataxia: 0 8 Sensory: 2 9 Best Language: 2 10 Dysarthria: 2 11 Extinct. and Inatten.: 0 TOTAL: 14   Labs I have reviewed labs  in epic and the results pertinent to this consultation are:  CBC    Component Value Date/Time   WBC 3.8 11/01/2021 1451   WBC 7.0 07/25/2018 1647   RBC 5.21 11/01/2021 1451   RBC 4.86 07/25/2018 1647   HGB 15.2 11/01/2021 1451   HCT 46.0 11/01/2021 1451   PLT 305 11/01/2021 1451   MCV 88 11/01/2021 1451   MCV 89 09/16/2013 0956   MCH 29.2 11/01/2021 1451   MCH 28.9 08/26/2016 0420   MCHC 33.0 11/01/2021 1451   MCHC 33.6 07/25/2018 1647   RDW 13.4 11/01/2021 1451   RDW 13.4 09/16/2013 0956   LYMPHSABS 1.4 11/01/2021 1451   MONOABS 1.1 (H) 07/25/2018 1647   EOSABS 0.1 11/01/2021 1451   BASOSABS 0.1 11/01/2021 1451    CMP     Component Value Date/Time   NA 141 11/01/2021 1451   NA 142 09/16/2013 0956   K 3.3 (L) 11/01/2021 1451   K 3.5 09/16/2013 0956   CL 102 11/01/2021 1451   CL 107 09/16/2013 0956   CO2 25 11/01/2021 1451   CO2 28 09/16/2013 0956   GLUCOSE 87 11/01/2021 1451   GLUCOSE 117 (H) 08/26/2016 0420   GLUCOSE 87 09/16/2013 0956   BUN 12 11/01/2021 1451   BUN 12 09/16/2013 0956   CREATININE 0.78 11/01/2021 1451   CREATININE 0.99 09/16/2013 0956    CALCIUM 9.7 11/01/2021 1451   CALCIUM 8.6 09/16/2013 0956   PROT 7.7 11/01/2021 1451   PROT 7.7 09/16/2013 0956   ALBUMIN 4.6 11/01/2021 1451   ALBUMIN 3.6 09/16/2013 0956   AST 15 11/01/2021 1451   AST 21 09/16/2013 0956   ALT 16 11/01/2021 1451   ALT 19 09/16/2013 0956   ALKPHOS 77 11/01/2021 1451   ALKPHOS 91 09/16/2013 0956   BILITOT 0.3 11/01/2021 1451   BILITOT 0.4 09/16/2013 0956   GFRNONAA 76 09/15/2019 0943   GFRNONAA >60 09/16/2013 0956   GFRAA 88 09/15/2019 0943   GFRAA >60 09/16/2013 0956    Lipid Panel     Component Value Date/Time   CHOL 172 11/01/2021 1451   TRIG 57 11/01/2021 1451   HDL 48 11/01/2021 1451   CHOLHDL 3.6 11/01/2021 1451   LDLCALC 113 (H) 11/01/2021 1451   Imaging I have reviewed the images obtained:  CT-head: Surface subarachnoid in the left lateral lobe without evidence of IVH or hydrocephalus.  Diffusely hyperdense appearance of dural venous sinuses raising possibility of dural venous sinus thrombus  CT angiography head and neck, CT venogram head: Shows occlusive thrombus in the right transverse and sigmoid sinuses with near occlusive thrombus extending from the vertex superior sagittal sinus to the trochlea.  Anterior portion of the superior sagittal sinus is patent.  Inferior sagittal sinus is somewhat diminutive but appears patent.  No emergent LVO.   MRI examination of the brain: Extensive dural venous sinus thrombus involving the superior sagittal and right transverse/sigmoid sinus with venous infarct with subarachnoid hemorrhage centered in the left parietal cortex.  Questionable signal abnormality in the medial left thalamus which may be related to seizure.  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 The subarachnoid hemorrhage is likely related to venous infarction from the dural venous sinus thrombus.  This  also requires further workup to look for underlying hypercoagulability.  Her clinical exam, could be related just the left parietal infarct but due to the presence of cortical subarachnoid blood over this infarct, I would like to evaluate her for seizures/nonconvulsive status epilepticus.  There was some evidence of intermittent facial twitching on exam.  I will treat her clinically for seizures irrespective.  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  -Admit to neurological ICU -she will need heparinization.  This is a tricky situation where she also has surface subarachnoid hemorrhage secondary to the dural venous sinus thrombus/cortical vein thrombus.  At this point, the benefits of anticoagulation supersede the risks of bleed because not using anticoagulation will lead to thrombus propagation in the venous sinuses and may worsen her subarachnoid and also cause venous infarcts, increasing her disability. - That said, the risk of bleeds given the existing subarachnoid hemorrhage will be high but that is a risk that will have to be taken at this time. - I will send a hypercoagulable panel to evaluate for any underlying hypercoagulability. -Check toxicology screen -  repeat CTH in 6h to ensure stability of the bleed as she will be started on heparin.  Hypertensive emergency, with concomitant subarachnoid hemorrhage as above -Systolic blood pressure goal due to the bleed and the fact that she will be heparinized-goal systolic blood pressure strictly 130-150.  Possible seizure causing speech difficulty and twitching - r/o status epilepticus - Ativan 2 mg IV x 1 - Loaded with Keppra 3 g IV x 1 - Start Keppra 500 twice daily - LTM EEG   Plan discussed with ED provider Dr. Ronnald Nian as well as critical care admitting attending Dr. Patsey Berthold in person.  -- Amie Portland, MD Neurologist [facility] Pager: [phone]  CRITICAL CARE ATTESTATION Performed by: Amie Portland, MD Total critical care time: 80 minutes Critical care time was exclusive of separately billable procedures and treating other patients and/or supervising APPs/Residents/Students Critical care was necessary to treat or prevent imminent or life-threatening deterioration. This patient is critically ill and at significant risk for neurological worsening and/or death and care requires constant monitoring. Critical care was time spent personally by me on the following activities: development of treatment plan with patient and/or surrogate as well as nursing, discussions with consultants, evaluation of patient's response to treatment, examination of patient, obtaining history from patient or surrogate, ordering and performing treatments and interventions, ordering and review of laboratory studies, ordering and review of radiographic studies, pulse oximetry, re-evaluation of patient's condition, participation in multidisciplinary rounds and medical decision making of high complexity in the care of this patient.

## 2022-04-13 NOTE — H&P (Signed)
NAME:  Catherine Kane, MRN:  [id number], DOB:  04-09-1970, LOS: 0 ADMISSION DATE:  04/13/2022, CONSULTATION DATE:  04/13/22  REFERRING MD:  Ronnald Nian, CHIEF COMPLAINT:  Altered   History of Present Illness:  52 yo woman presented 3/6 with headaches and ear pain x 2 days.  Was prescribed an antibiotic in the ED, but did not start it.  Today developed numbness and heaviness on R side of body, aphasia (at 2000).   CT showed L parietal SAH and possible dural venous sinus thrombus.   Pertinent  Medical History  Migraines.  HTN  Anxiety  Asthma Bronchitis Covid hx  B 12 deficiency  Vit D deficiency   Meds: trazodone, diovan, protonix, zofran, singulair, hctz, fluoxetine, vit D 2   Significant Hospital Events: Including procedures, antibiotic start and stop dates in addition to other pertinent events   CT-head: Surface subarachnoid in the left lateral lobe without evidence of IVH or hydrocephalus.  Diffusely hyperdense appearance of dural venous sinuses raising possibility of dural venous sinus thrombus   CT angiography head and neck, CT venogram head: Shows occlusive thrombus in the right transverse and sigmoid sinuses with near occlusive thrombus extending from the vertex superior sagittal sinus to the trochlea.  Anterior portion of the superior sagittal sinus is patent.  Inferior sagittal sinus is somewhat diminutive but appears patent.  No emergent LVO.     MRI examination of the brain: Extensive dural venous sinus thrombus involving the superior sagittal and right transverse/sigmoid sinus with venous infarct with subarachnoid hemorrhage centered in the left parietal cortex.  Questionable signal abnormality in the medial left thalamus which may be related to seizure. Interim History / Subjective:    Objective   Blood pressure (!) 189/110, pulse 76, resp. rate 15, SpO2 99 %.        Intake/Output Summary (Last 24 hours) at 04/13/2022 2219 Last data filed at 04/13/2022  2204 Gross per 24 hour  Intake 196.23 ml  Output --  Net 196.23 ml   There were no vitals filed for this visit.  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  Leading to venous infarct and SAH over L parietal lob Anticoag per neuro recs despite bleeding. Benefit > risk per neuro eval.   HTN:  Cleviprex goal 130-150.    Anxiety, manage prn .  Best Practice (right click and "Reselect all SmartList Selections" daily)   Diet/type: tubefeeds DVT prophylaxis: systemic heparin GI prophylaxis: protonix Lines: N/A Foley:  N/A Code Status:  full code Last date of multidisciplinary goals of care discussion '[]'$   Labs   CBC: Recent Labs  Lab 04/13/22 2035 04/13/22 2041  WBC 8.4  --   NEUTROABS 5.2  --   HGB 14.9 15.3*  HCT 43.7 45.0  MCV 84.7  --   PLT 326  --     Basic Metabolic Panel: Recent Labs  Lab 04/13/22 2035 04/13/22 2041  NA 134* 138  K 2.7* 2.7*  CL 99 100  CO2 24  --   GLUCOSE 107* 105*  BUN 5* 6  CREATININE 0.78 0.60  CALCIUM 9.2  --    GFR: Estimated Creatinine Clearance: 88.5 mL/min (by C-G formula  based on SCr of 0.6 mg/dL). Recent Labs  Lab 04/13/22 2035  WBC 8.4    Liver Function Tests: Recent Labs  Lab 04/13/22 2035  AST 27  ALT 24  ALKPHOS 84  BILITOT 0.5  PROT 8.2*  ALBUMIN 4.1   No results for input(s): "LIPASE", "AMYLASE" in the last 168 hours. No results for input(s): "AMMONIA" in the last 168 hours.  ABG    Component Value Date/Time   TCO2 24 04/13/2022 2041     Coagulation Profile: Recent Labs  Lab 04/13/22 2035  INR 1.1    Cardiac Enzymes: No results for input(s): "CKTOTAL", "CKMB", "CKMBINDEX", "TROPONINI" in the  last 168 hours.  HbA1C: Hemoglobin A1C  Date/Time Value Ref Range Status  09/08/2019 03:15 PM 5.3 4.0 - 5.6 % Final  09/12/2017 08:30 AM 5.2 4.0 - 5.6 % Final   HbA1c, POC (prediabetic range)  Date/Time Value Ref Range Status  09/08/2019 03:15 PM 5.3 (A) 5.7 - 6.4 % Final   HbA1c, POC (controlled diabetic range)  Date/Time Value Ref Range Status  09/08/2019 03:15 PM 5.3 0.0 - 7.0 % Final   HbA1c POC (<> result, manual entry)  Date/Time Value Ref Range Status  09/08/2019 03:15 PM 5.3 4.0 - 5.6 % Final    CBG: Recent Labs  Lab 04/13/22 2027  GLUCAP 101*    Review of Systems:   Review of Systems  Constitutional:  Negative for fever.  HENT:  Negative for hearing loss and tinnitus.   Eyes:  Negative for blurred vision.  Respiratory:  Negative for cough.   Cardiovascular:  Negative for chest pain.  Gastrointestinal:  Negative for heartburn.  Genitourinary:  Negative for dysuria.  Musculoskeletal:  Negative for myalgias.  Skin:  Negative for rash.  Neurological:  Negative for dizziness.  Endo/Heme/Allergies:  Does not bruise/bleed easily.  Psychiatric/Behavioral:  Negative for depression. The patient is not nervous/anxious.      Past Medical History:  She,  has a past medical history of Anxiety (09/2019), Asthma, Bronchitis, Coronavirus infection (12/22/2018), Hypertension, Insomnia, Vitamin B12 deficiency (09/2019), Vitamin D deficiency (10/2018), and Well woman exam with routine gynecological exam (11/26/2018).   Surgical History:   Past Surgical History:  Procedure Laterality Date   TONSILLECTOMY     TUBAL LIGATION       Social History:   reports that she has never smoked. She has never used smokeless tobacco. She reports current alcohol use of about 1.0 standard drink of alcohol per week. She reports that she does not use drugs.   Family History:  Her family history includes Breast cancer in her maternal aunt and maternal grandmother; Heart disease in her  mother and sister.   Allergies Allergies  Allergen Reactions   Grass Pollen([id number]-Swt Vern)      Home Medications  Prior to Admission medications   Medication Sig Start Date End Date Taking? Authorizing Provider  albuterol (VENTOLIN HFA) 108 (90 Base) MCG/ACT inhaler Inhale 2 puffs into the lungs every 6 (six) hours as needed for wheezing or shortness of breath. 03/21/22  Yes Elsie Stain, MD  amLODipine (NORVASC) 10 MG tablet Take 1 tablet (10 mg total) by mouth daily. 11/01/21   Elsie Stain, MD  aspirin EC 81 MG tablet Take 81 mg by mouth daily.    [provider]  atorvastatin (LIPITOR) 10 MG tablet Take 1 tablet (10 mg total) by mouth daily. 11/02/21   Elsie Stain, MD  benzonatate (TESSALON) 100 MG capsule Take 1 capsule (  100 mg total) by mouth 3 (three) times daily as needed for cough. 01/27/22   Elsie Stain, MD  carvedilol (COREG) 12.5 MG tablet Take 1 tablet (12.5 mg total) by mouth 2 (two) times daily with a meal. 11/01/21   Elsie Stain, MD  cefdinir (OMNICEF) 300 MG capsule Take 1 capsule (300 mg total) by mouth 2 (two) times daily. 04/13/22   Elsie Stain, MD  chlorpheniramine (CHLOR-TRIMETON) 4 MG tablet Take 4 mg by mouth every 4 (four) hours as needed for allergies.    [provider]  diclofenac Sodium (VOLTAREN) 1 % GEL Apply 2 g topically 4 (four) times daily. To painful joints 11/01/21   Elsie Stain, MD  ergocalciferol (VITAMIN D2) 1.25 MG (50000 UT) capsule Take 1 capsule (50,000 Units total) by mouth every 7 (seven) days. 11/01/21   Elsie Stain, MD  Ferrous Sulfate (IRON) 325 (65 Fe) MG TABS Take 1 tablet by mouth daily.    [provider]  FLUoxetine (PROZAC) 40 MG capsule Take 1 capsule (40 mg total) by mouth daily. 11/01/21   Elsie Stain, MD  hydrochlorothiazide (HYDRODIURIL) 25 MG tablet TAKE 1 TABLET (25 MG TOTAL) BY MOUTH DAILY. Patient not taking: Reported on 11/01/2021 09/08/19 09/07/20  Azzie Glatter, FNP  montelukast (SINGULAIR) 10 MG tablet Take 1 tablet (10 mg total) by mouth at bedtime. 11/01/21   Elsie Stain, MD  ondansetron (ZOFRAN) 4 MG tablet Take 1 tablet (4 mg total) by mouth every 8 (eight) hours as needed for nausea or vomiting. 04/11/22   Harris, Vernie Shanks, PA-C  pantoprazole (PROTONIX) 40 MG tablet Take 1 tablet (40 mg total) by mouth daily. Take 30-60 minutes before the first meal of the day. 11/01/21   Elsie Stain, MD  potassium chloride (KLOR-CON) 10 MEQ tablet Take 1 tablet (10 mEq total) by mouth 2 (two) times daily. 11/02/21   Elsie Stain, MD  Respiratory Therapy Supplies (FLUTTER) DEVI Use as directed Patient not taking: Reported on 11/01/2021 01/22/19   Tanda Rockers, MD  traZODone (DESYREL) 100 MG tablet Take 1 tablet (100 mg total) by mouth at bedtime. 11/01/21   Elsie Stain, MD  valsartan-hydrochlorothiazide (DIOVAN-HCT) 320-25 MG tablet Take 1 tablet by mouth daily. 11/01/21   Elsie Stain, MD     Critical care time: 45 min

## 2022-04-13 NOTE — ED Provider Notes (Signed)
[facility] Provider Note   CSN: [id number] Arrival date & time: 04/13/22  2019     History  Chief Complaint  Patient presents with   Numbness    Catherine Kane is a 52 y.o. female.  Patient here with right-sided numbness, headache, difficulty with speech.  She has been having bad headache for the last few days.  She is treated for ear infection.  Symptoms got worse just prior to arrival.  Numbness and speech difficulty just started about 30 minutes ago.  She is in a lot of discomfort.  Hard to get history and physical.  She denies any chest pain or shortness of breath.  The history is provided by the patient.       Home Medications Prior to Admission medications   Medication Sig Start Date End Date Taking? Authorizing Provider  albuterol (VENTOLIN HFA) 108 (90 Base) MCG/ACT inhaler Inhale 2 puffs into the lungs every 6 (six) hours as needed for wheezing or shortness of breath. 03/21/22   Elsie Stain, MD  amLODipine (NORVASC) 10 MG tablet Take 1 tablet (10 mg total) by mouth daily. 11/01/21   Elsie Stain, MD  aspirin EC 81 MG tablet Take 81 mg by mouth daily.    [provider]  atorvastatin (LIPITOR) 10 MG tablet Take 1 tablet (10 mg total) by mouth daily. 11/02/21   Elsie Stain, MD  benzonatate (TESSALON) 100 MG capsule Take 1 capsule (100 mg total) by mouth 3 (three) times daily as needed for cough. 01/27/22   Elsie Stain, MD  carvedilol (COREG) 12.5 MG tablet Take 1 tablet (12.5 mg total) by mouth 2 (two) times daily with a meal. 11/01/21   Elsie Stain, MD  cefdinir (OMNICEF) 300 MG capsule Take 1 capsule (300 mg total) by mouth 2 (two) times daily. 04/13/22   Elsie Stain, MD  chlorpheniramine (CHLOR-TRIMETON) 4 MG tablet Take 4 mg by mouth every 4 (four) hours as needed for allergies.    [provider]  diclofenac Sodium (VOLTAREN) 1 % GEL Apply 2 g topically 4 (four) times  daily. To painful joints 11/01/21   Elsie Stain, MD  ergocalciferol (VITAMIN D2) 1.25 MG (50000 UT) capsule Take 1 capsule (50,000 Units total) by mouth every 7 (seven) days. 11/01/21   Elsie Stain, MD  Ferrous Sulfate (IRON) 325 (65 Fe) MG TABS Take 1 tablet by mouth daily.    [provider]  FLUoxetine (PROZAC) 40 MG capsule Take 1 capsule (40 mg total) by mouth daily. 11/01/21   Elsie Stain, MD  hydrochlorothiazide (HYDRODIURIL) 25 MG tablet TAKE 1 TABLET (25 MG TOTAL) BY MOUTH DAILY. Patient not taking: Reported on 11/01/2021 09/08/19 09/07/20  Azzie Glatter, FNP  montelukast (SINGULAIR) 10 MG tablet Take 1 tablet (10 mg total) by mouth at bedtime. 11/01/21   Elsie Stain, MD  ondansetron (ZOFRAN) 4 MG tablet Take 1 tablet (4 mg total) by mouth every 8 (eight) hours as needed for nausea or vomiting. 04/11/22   Harris, Vernie Shanks, PA-C  pantoprazole (PROTONIX) 40 MG tablet Take 1 tablet (40 mg total) by mouth daily. Take 30-60 minutes before the first meal of the day. 11/01/21   Elsie Stain, MD  potassium chloride (KLOR-CON) 10 MEQ tablet Take 1 tablet (10 mEq total) by mouth 2 (two) times daily. 11/02/21   Elsie Stain, MD  Respiratory Therapy Supplies (FLUTTER) DEVI Use as directed Patient  not taking: Reported on 11/01/2021 01/22/19   Tanda Rockers, MD  traZODone (DESYREL) 100 MG tablet Take 1 tablet (100 mg total) by mouth at bedtime. 11/01/21   Elsie Stain, MD  valsartan-hydrochlorothiazide (DIOVAN-HCT) 320-25 MG tablet Take 1 tablet by mouth daily. 11/01/21   Elsie Stain, MD      Allergies    Grass pollen(k-o-r-t-swt vern)    Review of Systems   Review of Systems  Physical Exam Updated Vital Signs BP (!) 222/124 (BP Location: Right Arm)   Pulse 97   Resp 16   SpO2 100%  Physical Exam Vitals and nursing note reviewed.  Constitutional:      General: She is in acute distress.     Appearance: She is well-developed. She is ill-appearing.   HENT:     Head: Normocephalic and atraumatic.     Nose: Nose normal.     Mouth/Throat:     Mouth: Mucous membranes are moist.  Eyes:     Extraocular Movements: Extraocular movements intact.     Conjunctiva/sclera: Conjunctivae normal.     Pupils: Pupils are equal, round, and reactive to light.  Cardiovascular:     Rate and Rhythm: Normal rate and regular rhythm.     Heart sounds: No murmur heard. Pulmonary:     Effort: Pulmonary effort is normal. No respiratory distress.     Breath sounds: Normal breath sounds.  Abdominal:     Palpations: Abdomen is soft.     Tenderness: There is no abdominal tenderness.  Musculoskeletal:        General: No swelling.     Cervical back: Neck supple.  Skin:    General: Skin is warm and dry.     Capillary Refill: Capillary refill takes less than 2 seconds.  Neurological:     Mental Status: She is alert.     Comments: She endorses numbness to the right side of her body, seems like she is having difficulty with speech and overall difficulty to get examination.  She appears to be moving all extremities.  When she does speak speech is clear.  There is no obvious facial droop.  Psychiatric:        Mood and Affect: Mood normal.     ED Results / Procedures / Treatments   Labs (all labs ordered are listed, but only abnormal results are displayed) Labs Reviewed  CBC - Abnormal; Notable for the following components:      Result Value   RBC 5.16 (*)    All other components within normal limits  COMPREHENSIVE METABOLIC PANEL - Abnormal; Notable for the following components:   Sodium 134 (*)    Potassium 2.7 (*)    Glucose, Bld 107 (*)    BUN 5 (*)    Total Protein 8.2 (*)    All other components within normal limits  CBG MONITORING, ED - Abnormal; Notable for the following components:   Glucose-Capillary 101 (*)    All other components within normal limits  I-STAT CHEM 8, ED - Abnormal; Notable for the following components:   Potassium 2.7 (*)     Glucose, Bld 105 (*)    Calcium, Ion 1.00 (*)    Hemoglobin 15.3 (*)    All other components within normal limits  ETHANOL  PROTIME-INR  APTT  DIFFERENTIAL  RAPID URINE DRUG SCREEN, HOSP PERFORMED  URINALYSIS, ROUTINE W REFLEX MICROSCOPIC  I-STAT BETA HCG BLOOD, ED ([facility], [facility], [facility] ONLY)    EKG None  Radiology  MR BRAIN WO CONTRAST  Result Date: 04/13/2022 CLINICAL DATA:  Neuro deficit with acute stroke suspected EXAM: MRI HEAD WITHOUT CONTRAST TECHNIQUE: Multiplanar, multiecho pulse sequences of the brain and surrounding structures were obtained without intravenous contrast. COMPARISON:  Head CT and CTA from earlier today FINDINGS: Brain: Some restricted diffusion in the left parietal and posterior frontal cortex where there is subarachnoid hemorrhage by CT. Dural venous sinus thrombosis is present in the superior sagittal and right transverse/sigmoid sinuses. Possible low-grade restricted diffusion in the medial left thalamus. No hydrocephalus, collection, or masslike finding. Partially empty sella. Vascular: Dural venous sinus thrombosis. There is pending CT venogram. Skull and upper cervical spine: Low marrow signal, usually physiologic from anemia. Sinuses/Orbits: Partial right mastoid opacification, often reactive to anemia. These results were called by telephone at the time of interpretation on 04/13/2022 at 9:25 pm to provider [facility] , who verbally acknowledged these results. IMPRESSION: 1. 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 2. Questionable signal abnormality in the medial left thalamus which may be related to seizure. Electronically Signed   By: Jorje Guild M.D.   On: 04/13/2022 21:26   CT HEAD CODE STROKE WO CONTRAST  Result Date: 04/13/2022 CLINICAL DATA:  Code stroke.  Stroke suspected EXAM: CT HEAD WITHOUT CONTRAST  TECHNIQUE: Contiguous axial images were obtained from the base of the skull through the vertex without intravenous contrast. RADIATION DOSE REDUCTION: This exam was performed according to the departmental dose-optimization program which includes automated exposure control, adjustment of the mA and/or kV according to patient size and/or use of iterative reconstruction technique. COMPARISON:  CT Head 04/11/22 FINDINGS: Brain: There is subarachnoid hemorrhage along the left parietal lobe. No CT evidence of an acute infarct. No hydrocephalus. No mass effect. Vascular: There is diffusely hyperdense appearance of the dural venous sinuses, image raises the possibility for thrombosis, this is best seen along the right transverse sinus and the superior sagittal sinus. Skull: Normal. Negative for fracture or focal lesion. Sinuses/Orbits: No middle ear or mastoid effusion. Paranasal sinuses are clear. Orbits are unremarkable. Other: None. ASPECTS ([place] Stroke Program Early CT Score):10 IMPRESSION: 1. Subarachnoid hemorrhage in the left parietal lobe without evidence of intraventricular extension or hydrocephalus. 2. Diffusely hyperdense appearance of the dural venous sinuses raises the possibility for dural venous sinus thrombosis. Recommend CTV or mRV for further evaluation. 3. No CT evidence of an acute infarct. Findings were paged to Dr. Rory Percy at 8:50 PM on 04/13/22 Electronically Signed   By: Marin Roberts M.D.   On: 04/13/2022 20:53    Procedures .Critical Care  Performed by: Lennice Sites, DO Authorized by: Lennice Sites, DO   Critical care provider statement:    Critical care time (minutes):  40   Critical care was necessary to treat or prevent imminent or life-threatening deterioration of the following conditions:  CNS failure or compromise   Critical care was time spent personally by me on the following activities:  Blood draw for specimens, development of treatment plan with patient or surrogate,  discussions with primary provider, evaluation of patient's response to treatment, examination of patient, obtaining history from patient or surrogate, ordering and performing treatments and interventions, ordering and review of laboratory studies, ordering and review of radiographic studies, pulse oximetry, re-evaluation of patient's condition and review of old charts   I assumed direction of critical care for this patient from another provider in my specialty:  no       Medications Ordered in ED Medications  levETIRAcetam (KEPPRA) IVPB 1500 mg/ 100 mL premix (has no administration in time range)  LORazepam (ATIVAN) injection 2 mg (2 mg Intravenous Given 04/13/22 2138)  iohexol (OMNIPAQUE) 350 MG/ML injection 75 mL (75 mLs Intravenous Contrast Given 04/13/22 2123)    ED Course/ Medical Decision Making/ A&P                             Medical Decision Making Amount and/or Complexity of Data Reviewed Labs: ordered. Radiology: ordered.  Risk Decision regarding hospitalization.   Aneesah Ute Willhelm is here with strokelike symptoms.  History of migraines.  Patient arrives hypertensive.  Bad headache on and off for the last 3 days.  She had a head CT a couple days ago that was unremarkable.  She has been here 3 days in a row.  She is having right-sided numbness and speech difficulty that started about 30 minutes ago.  Talked with Dr. Malen Gauze with neurology on the phone and code stroke was activated.  Patient went immediately for head CT.  Per radiology report and neurology there is may be a small amount of subarachnoid hemorrhage and may be changes consistent with venous sinus thrombosis.  Patient went for MRI of the brain that showed extensive dural venous sinus thrombosis involving the superior sagittal and right transverse sigmoid sinuses with venous infarct and subarachnoid hemorrhage.  May be some abnormal signal through the left thalamus.  CT venogram done also shows thrombosis.  Neurology  is going to start heparin and watch the patient in the neurological ICU.  Will load with Keppra and Ativan.  Will need to manage blood pressure as well.  Dr. Malen Gauze has extensively talk to the patient about her findings and treatment.  Patient to be admitted to the ICU.  This chart was dictated using voice recognition software.  Despite best efforts to proofread,  errors can occur which can change the documentation meaning.         Final Clinical Impression(s) / ED Diagnoses Final diagnoses:  SAH (subarachnoid hemorrhage) ([place])  Acute cerebral venous sinus thrombosis    Rx / DC Orders ED Discharge Orders     None         Lennice Sites, DO 04/13/22 2138

## 2022-04-13 NOTE — Telephone Encounter (Signed)
Call placed to patient unable to reach. Unable to leave message VM not set up

## 2022-04-13 NOTE — ED Triage Notes (Signed)
Pt brought in by her husband who reports numbness to the right side of her body as well as trouble speaking, onset tonight 20 mins prior to arrival (2000). Hx of migraines.  Dr. Ronnald Nian at bedside at this time to assess for code stroke

## 2022-04-13 NOTE — ED Notes (Signed)
Dr Ronnald Nian states he will consult neuro at this time

## 2022-04-14 ENCOUNTER — Encounter: Payer: Self-pay | Admitting: Pharmacist

## 2022-04-14 ENCOUNTER — Inpatient Hospital Stay (HOSPITAL_COMMUNITY): Payer: Medicaid Other

## 2022-04-14 ENCOUNTER — Other Ambulatory Visit: Payer: Self-pay

## 2022-04-14 ENCOUNTER — Other Ambulatory Visit (HOSPITAL_COMMUNITY): Payer: Medicaid Other

## 2022-04-14 DIAGNOSIS — I609 Nontraumatic subarachnoid hemorrhage, unspecified: Secondary | ICD-10-CM | POA: Diagnosis not present

## 2022-04-14 DIAGNOSIS — I10 Essential (primary) hypertension: Secondary | ICD-10-CM | POA: Diagnosis not present

## 2022-04-14 DIAGNOSIS — F1410 Cocaine abuse, uncomplicated: Secondary | ICD-10-CM

## 2022-04-14 DIAGNOSIS — G08 Intracranial and intraspinal phlebitis and thrombophlebitis: Secondary | ICD-10-CM | POA: Diagnosis not present

## 2022-04-14 DIAGNOSIS — R569 Unspecified convulsions: Secondary | ICD-10-CM | POA: Diagnosis not present

## 2022-04-14 LAB — PHOSPHORUS
Phosphorus: 3 mg/dL (ref 2.5–4.6)
Phosphorus: 3.4 mg/dL (ref 2.5–4.6)

## 2022-04-14 LAB — CBC WITH DIFFERENTIAL/PLATELET
Abs Immature Granulocytes: 0.01 10*3/uL (ref 0.00–0.07)
Basophils Absolute: 0.1 10*3/uL (ref 0.0–0.1)
Basophils Relative: 1 %
Eosinophils Absolute: 0 10*3/uL (ref 0.0–0.5)
Eosinophils Relative: 1 %
HCT: 48.4 % — ABNORMAL HIGH (ref 36.0–46.0)
Hemoglobin: 16.6 g/dL — ABNORMAL HIGH (ref 12.0–15.0)
Immature Granulocytes: 0 %
Lymphocytes Relative: 24 %
Lymphs Abs: 2 10*3/uL (ref 0.7–4.0)
MCH: 29.3 pg (ref 26.0–34.0)
MCHC: 34.3 g/dL (ref 30.0–36.0)
MCV: 85.4 fL (ref 80.0–100.0)
Monocytes Absolute: 0.6 10*3/uL (ref 0.1–1.0)
Monocytes Relative: 8 %
Neutro Abs: 5.6 10*3/uL (ref 1.7–7.7)
Neutrophils Relative %: 66 %
Platelets: 313 10*3/uL (ref 150–400)
RBC: 5.67 MIL/uL — ABNORMAL HIGH (ref 3.87–5.11)
RDW: 12.9 % (ref 11.5–15.5)
WBC: 8.4 10*3/uL (ref 4.0–10.5)
nRBC: 0 % (ref 0.0–0.2)

## 2022-04-14 LAB — BASIC METABOLIC PANEL
Anion gap: 15 (ref 5–15)
BUN: 5 mg/dL — ABNORMAL LOW (ref 6–20)
CO2: 20 mmol/L — ABNORMAL LOW (ref 22–32)
Calcium: 9.8 mg/dL (ref 8.9–10.3)
Chloride: 102 mmol/L (ref 98–111)
Creatinine, Ser: 0.76 mg/dL (ref 0.44–1.00)
GFR, Estimated: >60 mL/min (ref >60)
Glucose, Bld: 79 mg/dL (ref 70–99)
Potassium: 3.4 mmol/L — ABNORMAL LOW (ref 3.5–5.1)
Sodium: 137 mmol/L (ref 135–145)

## 2022-04-14 LAB — MAGNESIUM
Magnesium: 1.8 mg/dL (ref 1.7–2.4)
Magnesium: 1.8 mg/dL (ref 1.7–2.4)

## 2022-04-14 LAB — HEPARIN LEVEL (UNFRACTIONATED)
Heparin Unfractionated: <0.1 IU/mL — ABNORMAL LOW (ref 0.30–0.70)
Heparin Unfractionated: <0.1 IU/mL — ABNORMAL LOW (ref 0.30–0.70)
Heparin Unfractionated: <0.1 IU/mL — ABNORMAL LOW (ref 0.30–0.70)

## 2022-04-14 LAB — SURGICAL PCR SCREEN
MRSA, PCR: NEGATIVE
Staphylococcus aureus: NEGATIVE

## 2022-04-14 MED ORDER — POTASSIUM CHLORIDE CRYS ER 20 MEQ PO TBCR
40.0000 meq | EXTENDED_RELEASE_TABLET | Freq: Once | ORAL | Status: AC
  Administered 2022-04-14: 40 meq via ORAL
  Filled 2022-04-14: qty 2

## 2022-04-14 MED ORDER — SODIUM CHLORIDE 0.9% FLUSH
10.0000 mL | Freq: Two times a day (BID) | INTRAVENOUS | Status: DC
  Administered 2022-04-14 – 2022-04-19 (×11): 10 mL

## 2022-04-14 MED ORDER — POTASSIUM CHLORIDE 20 MEQ PO PACK
60.0000 meq | PACK | Freq: Once | ORAL | Status: AC
  Administered 2022-04-14: 60 meq via ORAL
  Filled 2022-04-14: qty 3

## 2022-04-14 MED ORDER — ONDANSETRON HCL 4 MG/2ML IJ SOLN
4.0000 mg | Freq: Once | INTRAMUSCULAR | Status: AC
  Administered 2022-04-14: 4 mg via INTRAVENOUS
  Filled 2022-04-14: qty 2

## 2022-04-14 MED ORDER — CALCIUM GLUCONATE-NACL 2-0.675 GM/100ML-% IV SOLN
2.0000 g | Freq: Once | INTRAVENOUS | Status: AC
  Administered 2022-04-14: 2000 mg via INTRAVENOUS
  Filled 2022-04-14: qty 100

## 2022-04-14 MED ORDER — ATORVASTATIN CALCIUM 10 MG PO TABS: 10.0000 mg | ORAL_TABLET | Freq: Every day | ORAL | Status: DC

## 2022-04-14 MED ORDER — POTASSIUM CHLORIDE 10 MEQ/100ML IV SOLN
10.0000 meq | INTRAVENOUS | Status: DC
  Filled 2022-04-14 (×6): qty 100

## 2022-04-14 MED ORDER — METOCLOPRAMIDE HCL 5 MG/ML IJ SOLN
5.0000 mg | Freq: Once | INTRAMUSCULAR | Status: AC
  Administered 2022-04-14: 5 mg via INTRAVENOUS
  Filled 2022-04-14: qty 2

## 2022-04-14 MED ORDER — BUTALBITAL-APAP-CAFFEINE 50-325-40 MG PO TABS
1.0000 | ORAL_TABLET | ORAL | Status: DC | PRN
  Administered 2022-04-14 – 2022-04-19 (×20): 1 via ORAL
  Filled 2022-04-14 (×20): qty 1

## 2022-04-14 MED ORDER — FERROUS SULFATE 325 (65 FE) MG PO TABS
325.0000 mg | ORAL_TABLET | ORAL | Status: DC
  Administered 2022-04-15 – 2022-04-19 (×3): 325 mg via ORAL
  Filled 2022-04-14 (×4): qty 1

## 2022-04-14 MED ORDER — TRAZODONE HCL 100 MG PO TABS: 100.0000 mg | ORAL_TABLET | Freq: Every evening | ORAL | Status: DC | PRN

## 2022-04-14 MED ORDER — CHLORHEXIDINE GLUCONATE CLOTH 2 % EX PADS
6.0000 | MEDICATED_PAD | Freq: Once | CUTANEOUS | Status: AC
  Administered 2022-04-14: 6 via TOPICAL

## 2022-04-14 MED ORDER — SODIUM CHLORIDE 0.9% FLUSH: 10.0000 mL | INTRAVENOUS | Status: DC | PRN

## 2022-04-14 MED ORDER — ONDANSETRON HCL 4 MG/2ML IJ SOLN
4.0000 mg | Freq: Four times a day (QID) | INTRAMUSCULAR | Status: DC | PRN
  Administered 2022-04-14 – 2022-04-19 (×8): 4 mg via INTRAVENOUS
  Filled 2022-04-14 (×9): qty 2

## 2022-04-14 MED ORDER — MONTELUKAST SODIUM 10 MG PO TABS
10.0000 mg | ORAL_TABLET | Freq: Every day | ORAL | Status: DC
  Administered 2022-04-14 – 2022-04-18 (×5): 10 mg via ORAL
  Filled 2022-04-14 (×6): qty 1

## 2022-04-14 MED ORDER — MAGNESIUM SULFATE 2 GM/50ML IV SOLN
2.0000 g | Freq: Once | INTRAVENOUS | Status: AC
  Administered 2022-04-14: 2 g via INTRAVENOUS
  Filled 2022-04-14: qty 50

## 2022-04-14 MED ORDER — LABETALOL HCL 5 MG/ML IV SOLN
10.0000 mg | INTRAVENOUS | Status: DC | PRN
  Administered 2022-04-14: 10 mg via INTRAVENOUS
  Filled 2022-04-14: qty 4

## 2022-04-14 MED ORDER — CHLORHEXIDINE GLUCONATE CLOTH 2 % EX PADS
6.0000 | MEDICATED_PAD | Freq: Every morning | CUTANEOUS | Status: DC
  Administered 2022-04-14 – 2022-04-19 (×6): 6 via TOPICAL

## 2022-04-14 MED ORDER — PANTOPRAZOLE SODIUM 40 MG PO TBEC
40.0000 mg | DELAYED_RELEASE_TABLET | Freq: Every day | ORAL | Status: DC
  Administered 2022-04-14 – 2022-04-19 (×6): 40 mg via ORAL
  Filled 2022-04-14 (×6): qty 1

## 2022-04-14 MED ORDER — VITAMIN D (ERGOCALCIFEROL) 1.25 MG (50000 UNIT) PO CAPS
50000.0000 [IU] | ORAL_CAPSULE | ORAL | Status: DC
  Administered 2022-04-16: 50000 [IU] via ORAL
  Filled 2022-04-14: qty 1

## 2022-04-14 MED ORDER — ATORVASTATIN CALCIUM 40 MG PO TABS
40.0000 mg | ORAL_TABLET | Freq: Every day | ORAL | Status: DC
  Administered 2022-04-15 – 2022-04-19 (×5): 40 mg via ORAL
  Filled 2022-04-14 (×5): qty 1

## 2022-04-14 MED ORDER — LEVETIRACETAM 500 MG PO TABS
500.0000 mg | ORAL_TABLET | Freq: Two times a day (BID) | ORAL | Status: DC
  Administered 2022-04-14 – 2022-04-19 (×10): 500 mg via ORAL
  Filled 2022-04-14 (×10): qty 1

## 2022-04-14 MED ORDER — HYDRALAZINE HCL 20 MG/ML IJ SOLN
10.0000 mg | INTRAMUSCULAR | Status: DC | PRN
  Administered 2022-04-14: 10 mg via INTRAVENOUS
  Filled 2022-04-14: qty 1

## 2022-04-14 MED ORDER — AMLODIPINE BESYLATE 10 MG PO TABS
10.0000 mg | ORAL_TABLET | Freq: Every day | ORAL | Status: DC
  Administered 2022-04-14 – 2022-04-19 (×6): 10 mg via ORAL
  Filled 2022-04-14 (×6): qty 1

## 2022-04-14 MED ORDER — ACETAMINOPHEN 325 MG PO TABS
650.0000 mg | ORAL_TABLET | Freq: Four times a day (QID) | ORAL | Status: DC | PRN
  Administered 2022-04-14 (×2): 650 mg via ORAL
  Filled 2022-04-14 (×2): qty 2

## 2022-04-14 MED ORDER — FLUOXETINE HCL 20 MG PO CAPS
40.0000 mg | ORAL_CAPSULE | Freq: Every day | ORAL | Status: DC
  Administered 2022-04-14 – 2022-04-19 (×6): 40 mg via ORAL
  Filled 2022-04-14 (×6): qty 2

## 2022-04-14 NOTE — Progress Notes (Signed)
ANTICOAGULATION CONSULT NOTE Pharmacy Consult for heparin Indication:  dural venous sinus thrombosis Brief A/P: Heparin level subtherapeutic Increase Heparin rate  Allergies  Allergen Reactions   Grass Pollen(K-O-R-T-Swt Vern)    Patient Measurements: Weight: 105.5 kg (232 lb 9.4 oz)Actual body weight: 86.2 kg IBW: 52.4 kg Heparin Dosing Weight:   71.7 kg  Vital Signs: Temp: 98.1 F (36.7 C) (03/08 2000) Temp Source: Oral (03/08 2000) BP: 103/83 (03/08 1900) Pulse Rate: 78 (03/08 1900)  Labs: Recent Labs    04/13/22 2035 04/13/22 2041 04/14/22 0442 04/14/22 1253 04/14/22 2034  HGB 14.9 15.3* 16.6*  --   --   HCT 43.7 45.0 48.4*  --   --   PLT 326  --  313  --   --   APTT 30  --   --   --   --   LABPROT 13.9  --   --   --   --   INR 1.1  --   --   --   --   HEPARINUNFRC  --   --  <0.10* <0.10* <0.10*  CREATININE 0.78 0.60 0.76  --   --     Estimated Creatinine Clearance: 98.8 mL/min (by C-G formula based on SCr of 0.76 mg/dL).   Assessment: 52 yo female with dural venous sinus thrombosis and subarachnoid hemorrhage. Pharmacy consulted for heparin.   Heparin level <0.1 is still subtherapeutic on 1150 units/hr.  No issues with infusion or bleeding per RN.  Goal of Therapy:  Heparin level 0.3-0.5 Monitor platelets by anticoagulation protocol: Yes   Plan:  Increase Heparin 1300 units/hr Check heparin level in 6 hours.  F/u CBC, HL, and s/sx bleeding  Daily HL  Benetta Spar, PharmD, BCPS, [facility] Clinical Pharmacist  Please check AMION for all [facility] phone numbers After 10:00 PM, call [place] [phone]

## 2022-04-14 NOTE — Procedures (Addendum)
Patient Name: Catherine Kane  MRN: [id number]  Epilepsy Attending: Lora Havens  Referring Physician/Provider: Amie Portland, MD  Duration: 04/14/2022 0037 to 1109  Patient history: 52 year old female with a history of migraines, essential hypertension, anxiety and a history of COVID with unknown vaccination history initially presents with headaches and ear pain for 2 days. In the emergency department, she was found to have a left parietal subarachnoid hemorrhage with dural venous sinus thrombosis. Had possible associated seizure with speech difficulty and twitching. EEG to evaluate for seizure.  Level of alertness: Awake, asleep  AEDs during EEG study: LEV  Technical aspects: This EEG study was done with scalp electrodes positioned according to the 10-20 International system of electrode placement. Electrical activity was reviewed with band pass filter of 1-'70Hz'$ , sensitivity of 7 uV/mm, display speed of 56m/sec with a '60Hz'$  notched filter applied as appropriate. EEG data were recorded continuously and digitally stored.  Video monitoring was available and reviewed as appropriate.  Description: The posterior dominant rhythm consists of 8-9 Hz activity of moderate voltage (25-35 uV) seen predominantly in posterior head regions, symmetric and reactive to eye opening and eye closing. Sleep was characterized by vertex waves, sleep spindles (12 to 14 Hz), maximal frontocentral region. Hyperventilation and photic stimulation were not performed.     IMPRESSION: This study is within normal limits. No seizures or epileptiform discharges were seen throughout the recording.  Brantley Wiley OBarbra Sarks

## 2022-04-14 NOTE — H&P (Addendum)
NAME:  Catherine Kane, MRN:  [id number], DOB:  1970-04-03, LOS: 1 ADMISSION DATE:  04/13/2022, CONSULTATION DATE:  04/13/22  REFERRING MD:  Ronnald Nian, CHIEF COMPLAINT:  Altered   History of Present Illness:  52 yo woman presented 3/6 with headaches and ear pain x 2 days.  Was prescribed an antibiotic in the ED, but did not start it.  Today developed numbness and heaviness on R side of body, aphasia (at 2000).   CT showed L parietal SAH and possible dural venous sinus thrombus.   Pertinent  Medical History  Migraines.  HTN  Anxiety  Asthma Bronchitis Covid hx  B 12 deficiency  Vit D deficiency   Meds: trazodone, diovan, protonix, zofran, singulair, hctz, fluoxetine, vit D 2   Significant Hospital Events: Including procedures, antibiotic start and stop dates in addition to other pertinent events   CT-head: Surface subarachnoid in the left lateral lobe without evidence of IVH or hydrocephalus.  Diffusely hyperdense appearance of dural venous sinuses raising possibility of dural venous sinus thrombus   CT angiography head and neck, CT venogram head: Shows occlusive thrombus in the right transverse and sigmoid sinuses with near occlusive thrombus extending from the vertex superior sagittal sinus to the trochlea.  Anterior portion of the superior sagittal sinus is patent.  Inferior sagittal sinus is somewhat diminutive but appears patent.  No emergent LVO.     MRI examination of the brain: Extensive dural venous sinus thrombus involving the superior sagittal and right transverse/sigmoid sinus with venous infarct with subarachnoid hemorrhage centered in the left parietal cortex.  Questionable signal abnormality in the medial left thalamus which may be related to seizure. Interim History / Subjective:    10/15/22: wbc 8, afebrile.,  On room air.  Off cleviprex and aslee. U Tix positive for cocaine. EEG normal. BP normal. Ate Breakfast. RN say slightly weak on right side. On IV heparin  gtt. Per Dr Erlinda Hong -> can go to tele. SBP goal 120-160  Objective   Blood pressure (!) 145/89, pulse 82, temperature 97.7 F (36.5 C), temperature source Axillary, resp. rate (!) 22, weight 105.5 kg, SpO2 98 %.        Intake/Output Summary (Last 24 hours) at 04/14/2022 1035 Last data filed at 04/14/2022 0800 Gross per 24 hour  Intake 428.99 ml  Output --  Net 428.99 ml    Filed Weights   04/14/22 0415  Weight: 105.5 kg    Examination: General Appearance:  Looks well. She says she ate Head:  Normocephalic, without obvious abnormality, atraumatic Eyes:  PERRL - yes, conjunctiva/corneas - muddy     Ears:  Normal external ear canals, both ears Nose:  G tube - no Throat:  ETT TUBE - no , OG tube - no Neck:  Supple,  No enlargement/tenderness/nodules Lungs: Clear to auscultation bilaterally,  Heart:  S1 and S2 normal, no murmur, CVP - no.  Pressors - no Abdomen:  Soft, no masses, no organomegaly Genitalia / Rectal:  Not done Extremities:  Extremities- intact Skin:  ntact in exposed areas . Sacral area - not examined Neurologic:  Sedation - none -> RASS 0 to +1 . Moves all 4s - yes but slightly weak right siden. CAM-ICU - neg . 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  Leading to venous infarct and SAH over L parietal lob  3/8 - nneuro intact but mild righ  weakness  Plan  - IV heparin gtt per neuro ( Benefit > risk per neuro eval)  HTN:   3/8 - off cleviprex. On oral  Plan - oral BP agnets - SBO 120-160 per Dr Erlinda Hong  Cocaine  Plan   Get echo Check trop  Cleviprex goal 130-150.    Anxiety,  Plan -  manage prn .  Best Practice (right click and "Reselect all SmartList Selections" daily)   Diet/type: tubefeeds DVT prophylaxis: systemic heparin GI prophylaxis: protonix Lines: N/A Foley:  N/A Code Status:  full code Last date of multidisciplinary goals of care  discussion '[]'$  - patient updated  Dispo - move out of 4N to tele. [facility] pick up 04/15/22 and ccm off - d/w Dr Erlinda Hong of neuro and [facility]     ATTESTATION & SIGNATURE      Dr. Brand Males, M.D., [facility].C.P Pulmonary and Critical Care Medicine Medical Director - [facility] ICU Staff Physician, [facility] Pager: [phone], If no answer or between  15:00h - 7:00h: call [phone]  04/14/2022 10:36 AM     LABS    PULMONARY Recent Labs  Lab 04/13/22 2041  TCO2 24    CBC Recent Labs  Lab 04/13/22 2035 04/13/22 2041 04/14/22 0442  HGB 14.9 15.3* 16.6*  HCT 43.7 45.0 48.4*  WBC 8.4  --  8.4  PLT 326  --  313    COAGULATION Recent Labs  Lab 04/13/22 2035  INR 1.1    CARDIAC  No results for input(s): "TROPONINI" in the last 168 hours. No results for input(s): "PROBNP" in the last 168 hours.   CHEMISTRY Recent Labs  Lab 04/13/22 2035 04/13/22 2041 04/14/22 0052 04/14/22 0442  NA 134* 138  --  137  K 2.7* 2.7*  --  3.4*  CL 99 100  --  102  CO2 24  --   --  20*  GLUCOSE 107* 105*  --  79  BUN 5* 6  --  5*  CREATININE 0.78 0.60  --  0.76  CALCIUM 9.2  --   --  9.8  MG  --   --  1.8 1.8  PHOS  --   --  3.0 3.4   Estimated Creatinine Clearance: 98.8 mL/min (by C-G formula based on SCr of 0.76 mg/dL).   LIVER Recent Labs  Lab 04/13/22 2035  AST 27  ALT 24  ALKPHOS 84  BILITOT 0.5  PROT 8.2*  ALBUMIN 4.1  INR 1.1     INFECTIOUS No results for input(s): "LATICACIDVEN", "PROCALCITON" in the last 168 hours.   ENDOCRINE CBG (last 3)  Recent Labs    04/13/22 2027  GLUCAP 101*         IMAGING x48h  - image(s) personally visualized  -   highlighted in bold Overnight EEG with video  Result Date: 04/14/2022 Lora Havens, MD     04/14/2022  9:58 AM Patient Name: Catherine Kane MRN: [id number] Epilepsy Attending: Lora Havens Referring  Physician/Provider: Amie Portland, MD Duration: 04/14/2022 0037 to [phone] Patient history: 52 year old female with a history of migraines, essential hypertension, anxiety and a history of COVID with unknown vaccination history initially presents with headaches and ear pain for 2 days. In the emergency department, she was found to have a left parietal subarachnoid hemorrhage with dural venous sinus thrombosis. Had possible associated seizure with speech difficulty and twitching. EEG  to evaluate for seizure. Level of alertness: Awake, asleep AEDs during EEG study: LEV Technical aspects: This EEG study was done with scalp electrodes positioned according to the 10-20 International system of electrode placement. Electrical activity was reviewed with band pass filter of 1-'70Hz'$ , sensitivity of 7 uV/mm, display speed of 31m/sec with a '60Hz'$  notched filter applied as appropriate. EEG data were recorded continuously and digitally stored.  Video monitoring was available and reviewed as appropriate. Description: The posterior dominant rhythm consists of 8-9 Hz activity of moderate voltage (25-35 uV) seen predominantly in posterior head regions, symmetric and reactive to eye opening and eye closing. Sleep was characterized by vertex waves, sleep spindles (12 to 14 Hz), maximal frontocentral region. Hyperventilation and photic stimulation were not performed.   IMPRESSION: This study is within normal limits. No seizures or epileptiform discharges were seen throughout the recording. Priyanka OBarbra Sarks  CT HEAD WO CONTRAST (5MM)  Result Date: 04/14/2022 CLINICAL DATA:  52year old female presenting with headache and fever. Dural venous sinus thrombosis, trace left parietal lobe subarachnoid hemorrhage. EXAM: CT HEAD WITHOUT CONTRAST TECHNIQUE: Contiguous axial images were obtained from the base of the skull through the vertex without intravenous contrast. RADIATION DOSE REDUCTION: This exam was performed according to the departmental  dose-optimization program which includes automated exposure control, adjustment of the mA and/or kV according to patient size and/or use of iterative reconstruction technique. COMPARISON:  CTA, CT V, MRI brain yesterday. FINDINGS: Brain: Small volume subarachnoid hemorrhage or less likely cortical vein thrombosis in the left parietal lobe series 3, image 21, same area affected on plain CT yesterday. Regional cortical edema is slightly more apparent by CT, but extent appears stable since the MRI yesterday. No intracranial midline shift or significant intracranial mass effect. No ventriculomegaly. No new intracranial hemorrhage identified. No other convincing cerebral edema. Chronic partially empty sella. Basilar cisterns remain patent. Vascular: Ongoing hyperdense thrombus in the superior sagittal sinus extending to the torcula. Right transverse and sigmoid sinus thrombus better demonstrated on the contrast enhanced exams yesterday. No intracranial arterial hyperdensity is identified. Skull: No acute osseous abnormality identified. Sinuses/Orbits: Visualized paranasal sinuses and mastoids are stable and well aerated. Other: EKG leads in place. No acute orbit or scalp soft tissue finding. IMPRESSION: 1. 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 Probable venous infarct left parietal lobe with trace subarachnoid blood versus cortical vein thrombus is stable. No malignant hemorrhagic transformation. No intracranial mass effect at this time. 2. No new intracranial abnormality identified. Electronically Signed   By: HGenevie AnnM.D.   On: 04/14/2022 04:42   CT ANGIO HEAD NECK W WO CM  Result Date: 04/13/2022 CLINICAL DATA:  Dural venous sinus thrombosis suspected EXAM: CT ANGIOGRAPHY HEAD AND NECK CT VENOGRAM HEAD TECHNIQUE: Multidetector CT imaging of the head and neck was performed using the standard protocol during bolus administration of intravenous contrast. Multiplanar CT image  reconstructions and MIPs were obtained to evaluate the vascular anatomy. Carotid stenosis measurements (when applicable) are obtained utilizing NASCET criteria, using the distal internal carotid diameter as the denominator. Venographic phase images of the brain were obtained following the administration of intravenous contrast. Multiplanar reformats and maximum intensity projections were generated. RADIATION DOSE REDUCTION: This exam was performed according to the departmental dose-optimization program which includes automated exposure control, adjustment of the mA and/or kV according to patient size and/or use of iterative reconstruction technique. CONTRAST:  778mOMNIPAQUE IOHEXOL 350 MG/ML SOLN COMPARISON:  CT head and MRI head 04/13/2022  FINDINGS: CT HEAD FINDINGS For noncontrast findings, please see same day CT head. CTA NECK FINDINGS Aortic arch: Standard branching. Imaged portion shows no evidence of aneurysm or dissection. No significant stenosis of the major arch vessel origins. Right carotid system: No evidence of stenosis, dissection, or occlusion. Left carotid system: No evidence of stenosis, dissection, or occlusion. Retropharyngeal course of the distal CCA. Vertebral arteries: No evidence of stenosis, dissection, or occlusion. Skeleton: No acute osseous abnormality. Degenerative changes in the cervical spine. Poor dentition. Other neck: 7 mm partially calcified nodule in the left thyroid lobe, for which no follow-up is indicated. (Reference: J Am Coll Radiol. 2015 Feb;12(2): 143-50) Upper chest: No focal pulmonary opacity or pleural effusion. Review of the MIP images confirms the above findings CTA HEAD FINDINGS Anterior circulation: Both internal carotid arteries are patent to the termini, without significant stenosis. A1 segments patent. Normal anterior communicating artery. Anterior cerebral arteries are patent to their distal aspects. No M1 stenosis or occlusion. MCA branches perfused and  symmetric. Posterior circulation: Vertebral arteries patent to the vertebrobasilar junction without stenosis. Posterior inferior cerebellar arteries patent proximally. Basilar patent to its distal aspect. Superior cerebellar arteries patent proximally. Patent P1 segments, hypoplastic on the right. Near fetal origin of the right PCA from the right posterior communicating artery. The left posterior communicating artery is also patent. PCAs perfused to their distal aspects without stenosis. Anatomic variants: Near fetal origin of the right PCA. Review of the MIP images confirms the above findings CTV FINDINGS Superior sagittal sinus: The anterior portion of severe sagittal sinus is patent (CTA series 7, image 160), with near occlusive thrombus extending from the vertex superior sagittal sinus sinus to the torcula. Straight sinus: Patent. Inferior sagittal sinus, vein of Galen and internal cerebral veins: The inferior sagittal sinus is somewhat diminutive but appears patent. Vein of Galen and internal cerebral veins appear patent. Transverse sinuses: Occlusive thrombus on the right. The left transverse sinus is non dominant and diminutive, likely patent. Sigmoid sinuses: Occlusive thrombus on the right. The left sigmoid sinus is non dominant and diminutive but likely patent. Visualized jugular veins: Occluded proximally on the right. Patent on the left. Evaluation of the cavernous sinuses is somewhat limited, but there appears to be diminished and peripheral opacification, left greater than right, although the superior ophthalmic veins do not appear particularly enlarged IMPRESSION: 1. Occlusive thrombus in the right transverse and sigmoid sinuses, with near occlusive thrombus extending from the vertex superior sagittal sinus to the torcula. 2. The anterior portion of the superior sagittal sinus is patent. The inferior sagittal sinus is somewhat diminutive but appears patent. 3. No hemodynamically significant stenosis  in the neck. 4. No intracranial large vessel occlusion or significant stenosis. Imaging results were communicated on 04/13/2022 at 9:57 pm to provider Dr. Rory Percy via secure text paging. Electronically Signed   By: Merilyn Baba M.D.   On: 04/13/2022 21:59   CT VENOGRAM HEAD  Result Date: 04/13/2022 CLINICAL DATA:  Dural venous sinus thrombosis suspected EXAM: CT ANGIOGRAPHY HEAD AND NECK CT VENOGRAM HEAD TECHNIQUE: Multidetector CT imaging of the head and neck was performed using the standard protocol during bolus administration of intravenous contrast. Multiplanar CT image reconstructions and MIPs were obtained to evaluate the vascular anatomy. Carotid stenosis measurements (when applicable) are obtained utilizing NASCET criteria, using the distal internal carotid diameter as the denominator. Venographic phase images of the brain were obtained following the administration of intravenous contrast. Multiplanar reformats and maximum intensity projections were generated. RADIATION DOSE  REDUCTION: This exam was performed according to the departmental dose-optimization program which includes automated exposure control, adjustment of the mA and/or kV according to patient size and/or use of iterative reconstruction technique. CONTRAST:  24m OMNIPAQUE IOHEXOL 350 MG/ML SOLN COMPARISON:  CT head and MRI head 04/13/2022 FINDINGS: CT HEAD FINDINGS For noncontrast findings, please see same day CT head. CTA NECK FINDINGS Aortic arch: Standard branching. Imaged portion shows no evidence of aneurysm or dissection. No significant stenosis of the major arch vessel origins. Right carotid system: No evidence of stenosis, dissection, or occlusion. Left carotid system: No evidence of stenosis, dissection, or occlusion. Retropharyngeal course of the distal CCA. Vertebral arteries: No evidence of stenosis, dissection, or occlusion. Skeleton: No acute osseous abnormality. Degenerative changes in the cervical spine. Poor dentition. Other  neck: 7 mm partially calcified nodule in the left thyroid lobe, for which no follow-up is indicated. (Reference: J Am Coll Radiol. 2015 Feb;12(2): 143-50) Upper chest: No focal pulmonary opacity or pleural effusion. Review of the MIP images confirms the above findings CTA HEAD FINDINGS Anterior circulation: Both internal carotid arteries are patent to the termini, without significant stenosis. A1 segments patent. Normal anterior communicating artery. Anterior cerebral arteries are patent to their distal aspects. No M1 stenosis or occlusion. MCA branches perfused and symmetric. Posterior circulation: Vertebral arteries patent to the vertebrobasilar junction without stenosis. Posterior inferior cerebellar arteries patent proximally. Basilar patent to its distal aspect. Superior cerebellar arteries patent proximally. Patent P1 segments, hypoplastic on the right. Near fetal origin of the right PCA from the right posterior communicating artery. The left posterior communicating artery is also patent. PCAs perfused to their distal aspects without stenosis. Anatomic variants: Near fetal origin of the right PCA. Review of the MIP images confirms the above findings CTV FINDINGS Superior sagittal sinus: The anterior portion of severe sagittal sinus is patent (CTA series 7, image 160), with near occlusive thrombus extending from the vertex superior sagittal sinus sinus to the torcula. Straight sinus: Patent. Inferior sagittal sinus, vein of Galen and internal cerebral veins: The inferior sagittal sinus is somewhat diminutive but appears patent. Vein of Galen and internal cerebral veins appear patent. Transverse sinuses: Occlusive thrombus on the right. The left transverse sinus is non dominant and diminutive, likely patent. Sigmoid sinuses: Occlusive thrombus on the right. The left sigmoid sinus is non dominant and diminutive but likely patent. Visualized jugular veins: Occluded proximally on the right. Patent on the left.  Evaluation of the cavernous sinuses is somewhat limited, but there appears to be diminished and peripheral opacification, left greater than right, although the superior ophthalmic veins do not appear particularly enlarged IMPRESSION: 1. Occlusive thrombus in the right transverse and sigmoid sinuses, with near occlusive thrombus extending from the vertex superior sagittal sinus to the torcula. 2. The anterior portion of the superior sagittal sinus is patent. The inferior sagittal sinus is somewhat diminutive but appears patent. 3. No hemodynamically significant stenosis in the neck. 4. No intracranial large vessel occlusion or significant stenosis. Imaging results were communicated on 04/13/2022 at 9:57 pm to provider Dr. ARory Percyvia secure text paging. Electronically Signed   By: AMerilyn BabaM.D.   On: 04/13/2022 21:59   MR BRAIN WO CONTRAST  Result Date: 04/13/2022 CLINICAL DATA:  Neuro deficit with acute stroke suspected EXAM: MRI HEAD WITHOUT CONTRAST TECHNIQUE: Multiplanar, multiecho pulse sequences of the brain and surrounding structures were obtained without intravenous contrast. COMPARISON:  Head CT and CTA from earlier today FINDINGS: Brain: Some restricted diffusion in  the left parietal and posterior frontal cortex where there is subarachnoid hemorrhage by CT. Dural venous sinus thrombosis is present in the superior sagittal and right transverse/sigmoid sinuses. Possible low-grade restricted diffusion in the medial left thalamus. No hydrocephalus, collection, or masslike finding. Partially empty sella. Vascular: Dural venous sinus thrombosis. There is pending CT venogram. Skull and upper cervical spine: Low marrow signal, usually physiologic from anemia. Sinuses/Orbits: Partial right mastoid opacification, often reactive to anemia. These results were called by telephone at the time of interpretation on 04/13/2022 at 9:25 pm to provider [facility] , who verbally acknowledged these results. IMPRESSION: 1.  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 2. Questionable signal abnormality in the medial left thalamus which may be related to seizure. Electronically Signed   By: Jorje Guild M.D.   On: 04/13/2022 21:26   CT HEAD CODE STROKE WO CONTRAST  Result Date: 04/13/2022 CLINICAL DATA:  Code stroke.  Stroke suspected EXAM: CT HEAD WITHOUT CONTRAST TECHNIQUE: Contiguous axial images were obtained from the base of the skull through the vertex without intravenous contrast. RADIATION DOSE REDUCTION: This exam was performed according to the departmental dose-optimization program which includes automated exposure control, adjustment of the mA and/or kV according to patient size and/or use of iterative reconstruction technique. COMPARISON:  CT Head 04/11/22 FINDINGS: Brain: There is subarachnoid hemorrhage along the left parietal lobe. No CT evidence of an acute infarct. No hydrocephalus. No mass effect. Vascular: There is diffusely hyperdense appearance of the dural venous sinuses, image raises the possibility for thrombosis, this is best seen along the right transverse sinus and the superior sagittal sinus. Skull: Normal. Negative for fracture or focal lesion. Sinuses/Orbits: No middle ear or mastoid effusion. Paranasal sinuses are clear. Orbits are unremarkable. Other: None. ASPECTS ([place] Stroke Program Early CT Score):10 IMPRESSION: 1. Subarachnoid hemorrhage in the left parietal lobe without evidence of intraventricular extension or hydrocephalus. 2. Diffusely hyperdense appearance of the dural venous sinuses raises the possibility for dural venous sinus thrombosis. Recommend CTV or mRV for further evaluation. 3. No CT evidence of an acute infarct. Findings were paged to Dr. Rory Percy at 8:50 PM on 04/13/22 Electronically Signed   By: Marin Roberts M.D.   On: 04/13/2022 20:53

## 2022-04-14 NOTE — Progress Notes (Signed)
Vomiting again despite 2nd zofran   Plan - reglan '5mg'$  IV x 1 (QTc normal)     SIGNATURE    Dr. Brand Males, M.D., F.C.C.P,  Pulmonary and Critical Care Medicine Staff Physician, [place] Director - Interstitial Lung Disease  Program  Medical Director - [facility] [place] [facility] [place], [place], [address]   Pager: [phone], If no answer  -[place] or [place] [phone] Telephone (clinical office): [phone] Telephone (research): [phone]  2:41 PM 04/14/2022

## 2022-04-14 NOTE — Progress Notes (Signed)
ANTICOAGULATION CONSULT NOTE Pharmacy Consult for heparin Indication:  dural venous sinus thrombosis Brief A/P: Heparin level subtherapeutic Increase Heparin rate  Allergies  Allergen Reactions   Grass Pollen(K-O-R-T-Swt Vern)    Patient Measurements: Weight: 105.5 kg (232 lb 9.4 oz)Actual body weight: 86.2 kg IBW: 52.4 kg Heparin Dosing Weight:   71.7 kg  Vital Signs: Temp: 98.3 F (36.8 C) (03/08 0400) Temp Source: Oral (03/08 0400) BP: 107/67 (03/08 0515) Pulse Rate: 91 (03/08 0515)  Labs: Recent Labs    04/13/22 2035 04/13/22 2041 04/14/22 0442  HGB 14.9 15.3* 16.6*  HCT 43.7 45.0 48.4*  PLT 326  --  313  APTT 30  --   --   LABPROT 13.9  --   --   INR 1.1  --   --   HEPARINUNFRC  --   --  <0.10*  CREATININE 0.78 0.60  --     Estimated Creatinine Clearance: 98.8 mL/min (by C-G formula based on SCr of 0.6 mg/dL).   Assessment: 52 yo female with dural venous sinus thrombosis and subarachnoid hemorrhage for heparin Goal of Therapy:  Heparin level 0.3-0.5 Monitor platelets by anticoagulation protocol: Yes   Plan:  Increase Heparin 1000 units/hr Check heparin level in 8 hours.   Caryl Pina 04/14/2022,5:21 AM

## 2022-04-14 NOTE — Progress Notes (Signed)
   04/14/22 0900  Spiritual Encounters  Type of Visit Follow up  Care provided to: Patient  Referral source Chaplain team  Reason for visit Routine spiritual support  OnCall Visit No   Ch followed up with pt. She says she is exhausted and wanted to sleep. Pt will page Ch if she needs more help. No follow-up needed at this time.

## 2022-04-14 NOTE — Progress Notes (Signed)
ANTICOAGULATION CONSULT NOTE Pharmacy Consult for heparin Indication:  dural venous sinus thrombosis Brief A/P: Heparin level subtherapeutic Increase Heparin rate  Allergies  Allergen Reactions   Grass Pollen([id number]-[id number]-T-Swt Vern)    Patient Measurements: Weight: 105.5 kg (232 lb 9.4 oz)Actual body weight: 86.2 kg IBW: 52.4 kg Heparin Dosing Weight:   71.7 kg  Vital Signs: Temp: 97.7 F (36.5 C) (03/08 0800) Temp Source: Axillary (03/08 0800) BP: 107/69 (03/08 1200) Pulse Rate: 79 (03/08 1200)  Labs: Recent Labs    04/13/22 2035 04/13/22 2041 04/14/22 0442 04/14/22 1253  HGB 14.9 15.3* 16.6*  --   HCT 43.7 45.0 48.4*  --   PLT 326  --  313  --   APTT 30  --   --   --   LABPROT 13.9  --   --   --   INR 1.1  --   --   --   HEPARINUNFRC  --   --  <0.10* <0.10*  CREATININE 0.78 0.60 0.76  --     Estimated Creatinine Clearance: 98.8 mL/min (by C-G formula based on SCr of 0.76 mg/dL).   Assessment: 52 yo female with dural venous sinus thrombosis and subarachnoid hemorrhage for heparin  Low goal , no boluses HL < 0.10 (subtherapeutic)  Hgb 16.6/Plt 313  Goal of Therapy:  Heparin level 0.3-0.5 Monitor platelets by anticoagulation protocol: Yes   Plan:  Increase Heparin 1150 units/hr Check heparin level in 6 hours.  F/u CBC, HL, and s/sx bleeding  Daily HL  Wilson Singer, PharmD Clinical Pharmacist 04/14/2022 1:50 PM

## 2022-04-14 NOTE — Progress Notes (Signed)

## 2022-04-14 NOTE — Plan of Care (Signed)
CTH with no major change in the [facility]. Possible hypodensity in the left parietal lobe - more prominent venous infarct likely. Plan as before. -- Amie Portland, MD Neurologist [facility] Pager: [phone]

## 2022-04-14 NOTE — Telephone Encounter (Signed)
Patient hospital admit at [facility]. See ED notes

## 2022-04-14 NOTE — Plan of Care (Signed)
Preliminary EEG reviewed-no seizures.

## 2022-04-14 NOTE — Progress Notes (Addendum)
STROKE TEAM PROGRESS NOTE   INTERVAL HISTORY No family at the bedside. Currently off of cleviprex but did receive some PRN medications overnight. Norvasc resumed this morning. LTM in place. Still having headache (started about 5 days ago) and ear ache. Thinks she has been dehydrated lately. Had one miscarriage about 10 years ago. 6 pregnancies total. Will d/c EEG today. She does report a headache and photosensitivity   Vitals:   04/14/22 0600 04/14/22 0630 04/14/22 0645 04/14/22 0700  BP: 107/69 (!) 112/91 123/88 (!) 131/94  Pulse: 93 87 76 78  Resp: (!) 22 20 (!) 23 (!) 21  Temp:      TempSrc:      SpO2: 100% 100% 96% 99%  Weight:       CBC:  Recent Labs  Lab 04/13/22 2035 04/13/22 2041 04/14/22 0442  WBC 8.4  --  8.4  NEUTROABS 5.2  --  5.6  HGB 14.9 15.3* 16.6*  HCT 43.7 45.0 48.4*  MCV 84.7  --  85.4  PLT 326  --  [id number]   Basic Metabolic Panel:  Recent Labs  Lab 04/13/22 2035 04/13/22 2041 04/14/22 0052 04/14/22 0442  NA 134* 138  --  137  K 2.7* 2.7*  --  3.4*  CL 99 100  --  102  CO2 24  --   --  20*  GLUCOSE 107* 105*  --  79  BUN 5* 6  --  5*  CREATININE 0.78 0.60  --  0.76  CALCIUM 9.2  --   --  9.8  MG  --   --  1.8 1.8  PHOS  --   --  3.0 3.4   Lipid Panel: No results for input(s): "CHOL", "TRIG", "HDL", "CHOLHDL", "VLDL", "LDLCALC" in the last 168 hours. HgbA1c: No results for input(s): "HGBA1C" in the last 168 hours. Urine Drug Screen:  Recent Labs  Lab 04/13/22 2221  LABOPIA NONE DETECTED  COCAINSCRNUR POSITIVE*  LABBENZ NONE DETECTED  AMPHETMU NONE DETECTED  THCU NONE DETECTED  LABBARB NONE DETECTED    Alcohol Level  Recent Labs  Lab 04/13/22 2035  ETH <10    IMAGING past 24 hours CT HEAD WO CONTRAST (5MM)  Result Date: 04/14/2022 CLINICAL DATA:  52 year old female presenting with headache and fever. Dural venous sinus thrombosis, trace left parietal lobe subarachnoid hemorrhage. EXAM: CT HEAD WITHOUT CONTRAST TECHNIQUE: Contiguous  axial images were obtained from the base of the skull through the vertex without intravenous contrast. RADIATION DOSE REDUCTION: This exam was performed according to the departmental dose-optimization program which includes automated exposure control, adjustment of the mA and/or kV according to patient size and/or use of iterative reconstruction technique. COMPARISON:  CTA, CT V, MRI brain yesterday. FINDINGS: Brain: Small volume subarachnoid hemorrhage or less likely cortical vein thrombosis in the left parietal lobe series 3, image 21, same area affected on plain CT yesterday. Regional cortical edema is slightly more apparent by CT, but extent appears stable since the MRI yesterday. No intracranial midline shift or significant intracranial mass effect. No ventriculomegaly. No new intracranial hemorrhage identified. No other convincing cerebral edema. Chronic partially empty sella. Basilar cisterns remain patent. Vascular: Ongoing hyperdense thrombus in the superior sagittal sinus extending to the torcula. Right transverse and sigmoid sinus thrombus better demonstrated on the contrast enhanced exams yesterday. No intracranial arterial hyperdensity is identified. Skull: No acute osseous abnormality identified. Sinuses/Orbits: Visualized paranasal sinuses and mastoids are stable and well aerated. Other: EKG leads in place. No acute orbit or  scalp soft tissue finding. IMPRESSION: 1. 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 Probable venous infarct left parietal lobe with trace subarachnoid blood versus cortical vein thrombus is stable. No malignant hemorrhagic transformation. No intracranial mass effect at this time. 2. No new intracranial abnormality identified. Electronically Signed   By: Genevie Ann M.D.   On: 04/14/2022 04:42   CT ANGIO HEAD NECK W WO CM  Result Date: 04/13/2022 CLINICAL DATA:  Dural venous sinus thrombosis suspected EXAM: CT ANGIOGRAPHY HEAD AND NECK CT VENOGRAM  HEAD TECHNIQUE: Multidetector CT imaging of the head and neck was performed using the standard protocol during bolus administration of intravenous contrast. Multiplanar CT image reconstructions and MIPs were obtained to evaluate the vascular anatomy. Carotid stenosis measurements (when applicable) are obtained utilizing NASCET criteria, using the distal internal carotid diameter as the denominator. Venographic phase images of the brain were obtained following the administration of intravenous contrast. Multiplanar reformats and maximum intensity projections were generated. RADIATION DOSE REDUCTION: This exam was performed according to the departmental dose-optimization program which includes automated exposure control, adjustment of the mA and/or kV according to patient size and/or use of iterative reconstruction technique. CONTRAST:  30m OMNIPAQUE IOHEXOL 350 MG/ML SOLN COMPARISON:  CT head and MRI head 04/13/2022 FINDINGS: CT HEAD FINDINGS For noncontrast findings, please see same day CT head. CTA NECK FINDINGS Aortic arch: Standard branching. Imaged portion shows no evidence of aneurysm or dissection. No significant stenosis of the major arch vessel origins. Right carotid system: No evidence of stenosis, dissection, or occlusion. Left carotid system: No evidence of stenosis, dissection, or occlusion. Retropharyngeal course of the distal CCA. Vertebral arteries: No evidence of stenosis, dissection, or occlusion. Skeleton: No acute osseous abnormality. Degenerative changes in the cervical spine. Poor dentition. Other neck: 7 mm partially calcified nodule in the left thyroid lobe, for which no follow-up is indicated. (Reference: J Am Coll Radiol. 2015 Feb;12(2): 143-50) Upper chest: No focal pulmonary opacity or pleural effusion. Review of the MIP images confirms the above findings CTA HEAD FINDINGS Anterior circulation: Both internal carotid arteries are patent to the termini, without significant stenosis. A1  segments patent. Normal anterior communicating artery. Anterior cerebral arteries are patent to their distal aspects. No M1 stenosis or occlusion. MCA branches perfused and symmetric. Posterior circulation: Vertebral arteries patent to the vertebrobasilar junction without stenosis. Posterior inferior cerebellar arteries patent proximally. Basilar patent to its distal aspect. Superior cerebellar arteries patent proximally. Patent P1 segments, hypoplastic on the right. Near fetal origin of the right PCA from the right posterior communicating artery. The left posterior communicating artery is also patent. PCAs perfused to their distal aspects without stenosis. Anatomic variants: Near fetal origin of the right PCA. Review of the MIP images confirms the above findings CTV FINDINGS Superior sagittal sinus: The anterior portion of severe sagittal sinus is patent (CTA series 7, image 160), with near occlusive thrombus extending from the vertex superior sagittal sinus sinus to the torcula. Straight sinus: Patent. Inferior sagittal sinus, vein of Galen and internal cerebral veins: The inferior sagittal sinus is somewhat diminutive but appears patent. Vein of Galen and internal cerebral veins appear patent. Transverse sinuses: Occlusive thrombus on the right. The left transverse sinus is non dominant and diminutive, likely patent. Sigmoid sinuses: Occlusive thrombus on the right. The left sigmoid sinus is non dominant and diminutive but likely patent. Visualized jugular veins: Occluded proximally on the right. Patent on the left. Evaluation of the cavernous sinuses is somewhat limited, but there  appears to be diminished and peripheral opacification, left greater than right, although the superior ophthalmic veins do not appear particularly enlarged IMPRESSION: 1. Occlusive thrombus in the right transverse and sigmoid sinuses, with near occlusive thrombus extending from the vertex superior sagittal sinus to the torcula. 2. The  anterior portion of the superior sagittal sinus is patent. The inferior sagittal sinus is somewhat diminutive but appears patent. 3. No hemodynamically significant stenosis in the neck. 4. No intracranial large vessel occlusion or significant stenosis. Imaging results were communicated on 04/13/2022 at 9:57 pm to provider Dr. Rory Percy via secure text paging. Electronically Signed   By: Merilyn Baba M.D.   On: 04/13/2022 21:59   CT VENOGRAM HEAD  Result Date: 04/13/2022 CLINICAL DATA:  Dural venous sinus thrombosis suspected EXAM: CT ANGIOGRAPHY HEAD AND NECK CT VENOGRAM HEAD TECHNIQUE: Multidetector CT imaging of the head and neck was performed using the standard protocol during bolus administration of intravenous contrast. Multiplanar CT image reconstructions and MIPs were obtained to evaluate the vascular anatomy. Carotid stenosis measurements (when applicable) are obtained utilizing NASCET criteria, using the distal internal carotid diameter as the denominator. Venographic phase images of the brain were obtained following the administration of intravenous contrast. Multiplanar reformats and maximum intensity projections were generated. RADIATION DOSE REDUCTION: This exam was performed according to the departmental dose-optimization program which includes automated exposure control, adjustment of the mA and/or kV according to patient size and/or use of iterative reconstruction technique. CONTRAST:  57m OMNIPAQUE IOHEXOL 350 MG/ML SOLN COMPARISON:  CT head and MRI head 04/13/2022 FINDINGS: CT HEAD FINDINGS For noncontrast findings, please see same day CT head. CTA NECK FINDINGS Aortic arch: Standard branching. Imaged portion shows no evidence of aneurysm or dissection. No significant stenosis of the major arch vessel origins. Right carotid system: No evidence of stenosis, dissection, or occlusion. Left carotid system: No evidence of stenosis, dissection, or occlusion. Retropharyngeal course of the distal CCA.  Vertebral arteries: No evidence of stenosis, dissection, or occlusion. Skeleton: No acute osseous abnormality. Degenerative changes in the cervical spine. Poor dentition. Other neck: 7 mm partially calcified nodule in the left thyroid lobe, for which no follow-up is indicated. (Reference: J Am Coll Radiol. 2015 Feb;12(2): 143-50) Upper chest: No focal pulmonary opacity or pleural effusion. Review of the MIP images confirms the above findings CTA HEAD FINDINGS Anterior circulation: Both internal carotid arteries are patent to the termini, without significant stenosis. A1 segments patent. Normal anterior communicating artery. Anterior cerebral arteries are patent to their distal aspects. No M1 stenosis or occlusion. MCA branches perfused and symmetric. Posterior circulation: Vertebral arteries patent to the vertebrobasilar junction without stenosis. Posterior inferior cerebellar arteries patent proximally. Basilar patent to its distal aspect. Superior cerebellar arteries patent proximally. Patent P1 segments, hypoplastic on the right. Near fetal origin of the right PCA from the right posterior communicating artery. The left posterior communicating artery is also patent. PCAs perfused to their distal aspects without stenosis. Anatomic variants: Near fetal origin of the right PCA. Review of the MIP images confirms the above findings CTV FINDINGS Superior sagittal sinus: The anterior portion of severe sagittal sinus is patent (CTA series 7, image 160), with near occlusive thrombus extending from the vertex superior sagittal sinus sinus to the torcula. Straight sinus: Patent. Inferior sagittal sinus, vein of Galen and internal cerebral veins: The inferior sagittal sinus is somewhat diminutive but appears patent. Vein of Galen and internal cerebral veins appear patent. Transverse sinuses: Occlusive thrombus on the right. The left transverse sinus  is non dominant and diminutive, likely patent. Sigmoid sinuses: Occlusive  thrombus on the right. The left sigmoid sinus is non dominant and diminutive but likely patent. Visualized jugular veins: Occluded proximally on the right. Patent on the left. Evaluation of the cavernous sinuses is somewhat limited, but there appears to be diminished and peripheral opacification, left greater than right, although the superior ophthalmic veins do not appear particularly enlarged IMPRESSION: 1. Occlusive thrombus in the right transverse and sigmoid sinuses, with near occlusive thrombus extending from the vertex superior sagittal sinus to the torcula. 2. The anterior portion of the superior sagittal sinus is patent. The inferior sagittal sinus is somewhat diminutive but appears patent. 3. No hemodynamically significant stenosis in the neck. 4. No intracranial large vessel occlusion or significant stenosis. Imaging results were communicated on 04/13/2022 at 9:57 pm to provider Dr. Rory Percy via secure text paging. Electronically Signed   By: Merilyn Baba M.D.   On: 04/13/2022 21:59   MR BRAIN WO CONTRAST  Result Date: 04/13/2022 CLINICAL DATA:  Neuro deficit with acute stroke suspected EXAM: MRI HEAD WITHOUT CONTRAST TECHNIQUE: Multiplanar, multiecho pulse sequences of the brain and surrounding structures were obtained without intravenous contrast. COMPARISON:  Head CT and CTA from earlier today FINDINGS: Brain: Some restricted diffusion in the left parietal and posterior frontal cortex where there is subarachnoid hemorrhage by CT. Dural venous sinus thrombosis is present in the superior sagittal and right transverse/sigmoid sinuses. Possible low-grade restricted diffusion in the medial left thalamus. No hydrocephalus, collection, or masslike finding. Partially empty sella. Vascular: Dural venous sinus thrombosis. There is pending CT venogram. Skull and upper cervical spine: Low marrow signal, usually physiologic from anemia. Sinuses/Orbits: Partial right mastoid opacification, often reactive to anemia.  These results were called by telephone at the time of interpretation on 04/13/2022 at 9:25 pm to provider [facility] , who verbally acknowledged these results. IMPRESSION: 1. 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 2. Questionable signal abnormality in the medial left thalamus which may be related to seizure. Electronically Signed   By: Jorje Guild M.D.   On: 04/13/2022 21:26   CT HEAD CODE STROKE WO CONTRAST  Result Date: 04/13/2022 CLINICAL DATA:  Code stroke.  Stroke suspected EXAM: CT HEAD WITHOUT CONTRAST TECHNIQUE: Contiguous axial images were obtained from the base of the skull through the vertex without intravenous contrast. RADIATION DOSE REDUCTION: This exam was performed according to the departmental dose-optimization program which includes automated exposure control, adjustment of the mA and/or kV according to patient size and/or use of iterative reconstruction technique. COMPARISON:  CT Head 04/11/22 FINDINGS: Brain: There is subarachnoid hemorrhage along the left parietal lobe. No CT evidence of an acute infarct. No hydrocephalus. No mass effect. Vascular: There is diffusely hyperdense appearance of the dural venous sinuses, image raises the possibility for thrombosis, this is best seen along the right transverse sinus and the superior sagittal sinus. Skull: Normal. Negative for fracture or focal lesion. Sinuses/Orbits: No middle ear or mastoid effusion. Paranasal sinuses are clear. Orbits are unremarkable. Other: None. ASPECTS ([place] Stroke Program Early CT Score):10 IMPRESSION: 1. Subarachnoid hemorrhage in the left parietal lobe without evidence of intraventricular extension or hydrocephalus. 2. Diffusely hyperdense appearance of the dural venous sinuses raises the possibility for dural venous sinus thrombosis. Recommend CTV or mRV for further  evaluation. 3. No CT evidence of an acute infarct. Findings were paged to Dr. Rory Percy  at 8:50 PM on 04/13/22 Electronically Signed   By: Marin Roberts M.D.   On: 04/13/2022 20:53    PHYSICAL EXAM Constitutional: Appears well-developed and well-nourished.   Cardiovascular: Normal rate and regular rhythm.  Respiratory: Effort normal, non-labored breathing  Neuro: Mental Status: Patient is awake, alert, oriented to person, place, month, year, and situation. Patient is able to give a clear and coherent history. No signs of aphasia or neglect Cranial Nerves: II: Visual Fields are full. Pupils are equal, round, and reactive to light.   III,IV, VI: EOMI without ptosis or diploplia.  V: Facial sensation is symmetric to temperature VII: Facial movement is symmetric resting and smiling VIII: Hearing is intact to voice X: Palate elevates symmetrically XI: Shoulder shrug is symmetric. XII: Tongue protrudes midline without atrophy or fasciculations.   Motor: Tone is normal. Bulk is normal.   Feels weaker on the left, but no drift.  4/5 right upper extremity effort dependent RLE 5/5 Left upper and lower extremity 5/5  Sensory: Sensation is symmetric to light touch and temperature in the arms and legs.  Diminished sensation on the right   Cerebellar: FNF and HKS are intact bilaterally Tremor on the left upper     ASSESSMENT/PLAN [name] is a 52 y.o. female with history of anxiety hypertension vitamin B and D deficiency presented to the emergency room for evaluation of ongoing headache for the past 3 days.   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 Diffusely hyperdense appearance of the dural venous sinuses raises the possibility for dural venous sinus thrombosis.  CTA/CTV- Occlusive thrombus in the  right transverse and sigmoid sinuses, with near occlusive thrombus extending from the vertex superior sagittal sinus to the torcula. The anterior portion of the superior sagittal sinus is patent. The inferior sagittal sinus is somewhat diminutive but appears patent. MRI- Extensive dural venous sinus thrombosis involving the superior sagittal and right transverse/sigmoid sinuses. 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 Questionable signal abnormality in the medial left thalamus which may be related to seizure. Repeat CT Head- Extensive dural venous sinus thrombosis as demonstrated by CTV/MRI yesterday. Probable venous infarct left parietal lobe with trace subarachnoid blood versus cortical vein thrombus is stable Will do pan CT in am for etiology investigation EEG- This study is within normal limits. No seizures or epileptiform discharges were seen throughout the recording.  2D Echo Pending LDL pending HgbA1c pending UDS + for cocaine Hypercoagulable labs and ANA pending VTE prophylaxis - Heparin IV aspirin 81 mg daily prior to admission, now on heparin IV.  On keppra for seizure prevention for 7 days Therapy recommendations:  pending  Disposition:  pending   Hypertension Home meds:  Norvasc '10mg'$ , Coreg 12.'5mg'$ , valsartan-hydrochlorothiazide Stable BP goal < 160 IV Cleviprex - currently off Norvasc resumed   Hyperlipidemia Home meds:  Atorvastatin '10mg'$ , resumed in hospital LDL 113, goal < 70 Increase to '40mg'$  Continue statin at discharge  Tobacco abuse Current smoker Smoking cessation counseling provided Pt is willing to quit  Cocaine abuse UDS + cocaine Cessation education provided Pt is willing to quit  Other Stroke Risk Factors Obesity, Body mass index is 41.2 kg/m., BMI >/= 30 associated with increased stroke risk, recommend weight loss, diet and exercise as appropriate   Other Active Problems Hypokalemia K 2.7 -> 3.4-  replaced  Hospital day #  1  Patient seen and examined by NP/APP with MD. MD to update note as needed.   Janine Ores, DNP, FNP-BC [facility] Pager: [phone]  ATTENDING NOTE: I reviewed above note and agree with the assessment and plan. Pt was seen and examined.   No family at the bedside. Pt lying in bed, awake alert, orientated, no aphasia, fluent language, no cranial N abnormalities, still has right UE numbness and right UE and LE mild drift but with some functional component. Otherwise, neuro stable. On heparin IV. Educated on smoking and cocaine cessation. Will need further work up given young age and extensive CSVT, including hypercoag lab and pan CT. Continue keppra for seizure prevention, EEG negative for seizure. PT/OT pending  For detailed assessment and plan, please refer to above/below as I have made changes wherever appropriate.   Rosalin Hawking, MD PhD Stroke Neurology 04/14/2022 7:00 PM  This patient is critically ill due to extensive CSVT with [facility], cocaine abuse, HA, hypertensive emergency and at significant risk of neurological worsening, death form recurrent SAH, worsening CSVT, hypertensive encephalopathy. This patient's care requires constant monitoring of vital signs, hemodynamics, respiratory and cardiac monitoring, review of multiple databases, neurological assessment, discussion with family, other specialists and medical decision making of high complexity. I spent 40 minutes of neurocritical care time in the care of this patient.     To contact Stroke Continuity provider, please refer to [email or web address] After hours, contact General Neurology

## 2022-04-14 NOTE — Progress Notes (Signed)
LTM EEG hooked up and running - no initial skin breakdown - push button tested - [facility] monitoring.  

## 2022-04-14 NOTE — Progress Notes (Signed)
LTM Maintenance completed, no skin breakdown at all skin sites.

## 2022-04-14 NOTE — Progress Notes (Addendum)
Pt note to have SBP >150. Pt currently due calcium gluconate w/ only 2 dedicated PIVs. Pt needing Clev to control SBP, so a prn requested. Pt difficult stick and IV team not able to place a PIV. Per IV team, "pt would need central line and those are not done at night". Dr. Dorian Pod informed via [employer] rep.

## 2022-04-14 NOTE — Plan of Care (Signed)
  Problem: Education: Goal: Knowledge of disease or condition will improve Outcome: Progressing Goal: Knowledge of secondary prevention will improve (MUST DOCUMENT ALL) Outcome: Progressing Goal: Knowledge of patient specific risk factors will improve Catherine Kane N/A or DELETE if not current risk factor) Outcome: Progressing   Problem: Ischemic Stroke/TIA Tissue Perfusion: Goal: Complications of ischemic stroke/TIA will be minimized Outcome: Progressing   Problem: Coping: Goal: Will verbalize positive feelings about self Outcome: Progressing Goal: Will identify appropriate support needs Outcome: Progressing   Problem: Health Behavior/Discharge Planning: Goal: Ability to manage health-related needs will improve Outcome: Progressing

## 2022-04-14 NOTE — Progress Notes (Addendum)
eLink Physician-Brief Progress Note Patient Name: Catherine Kane DOB: 29-Mar-1970 MRN: [id number]   Date of Service  04/14/2022  HPI/Events of Note  52 year old female with a history of migraines, essential hypertension, anxiety and a history of COVID with unknown vaccination history initially presents with headaches and ear pain for 2 days.  In the emergency department, she was found to have a left parietal subarachnoid hemorrhage with dural venous sinus thrombosis.  Had possible associated seizure with speech difficulty and twitching.  No further twitching.  Mild loss of sensation in the right upper and lower extremity, mild weakness in the right upper and lower extremity.  No droop.  Speaking in full sentences.  eICU Interventions  Patient is already on Cleviprex with SBP goal 130-150.  Starting systemic anticoagulation with evaluation  Repeat CT of the head is ordered for the morning  Loaded on antiepileptics and undergoing EEG.  No acute intervention is indicated.    0112 - switch Kcl to PO, advance diet as tolerated. Passed swallow screen.  0203 - IV team unable to get access on this pt, difficult stick.  Has PIV x2...one has heparin, the other cleviprex.  RN stopped cleviprex to run Ca gluc.  SBP goal 130-150, now 151-96 (110), HR 73. ----> Add PRN IV antihypertensives  0257 - pt c/o HA, does not have anything for pain; add tylenol (1st line) and Fioricet (2nd line) for HA  0336 - Zofran ordered. Ongoing nausea. Repeat CT pending  Intervention Category Major Interventions: Seizures - evaluation and management Evaluation Type: New Patient Evaluation  [name] 04/14/2022, 12:26 AM

## 2022-04-14 NOTE — Progress Notes (Signed)
   04/13/22 2135  Spiritual Encounters  Type of Visit Initial  Care provided to: Patient  Referral source Nurse (RN/NT/LPN)  Reason for visit Urgent spiritual support  OnCall Visit Yes   Chaplain responded to a call for prayer support. Nurse said that patient asked for prayer. I met with the patient, Catherine Kane. She expressed fear in her present state.. She shared what brought her to the hospital and how she wants to go home. We prayed together for a bit and I continued to listen to Orin story. Family called on the phone and Chrysa wanted to talk to that person. I departed and wished her well through the night.   Danice Goltz [facility]  [phone]

## 2022-04-14 NOTE — Progress Notes (Signed)
[facility] ADULT ICU REPLACEMENT PROTOCOL   The patient does apply for the [facility] Adult ICU Electrolyte Replacment Protocol based on the criteria listed below:   1.Exclusion criteria: TCTS, ECMO, Dialysis, and Myasthenia Gravis patients 2. Is GFR >/= 30 ml/min? Yes.    Patient's GFR today is >60 3. Is SCr </= 2? Yes.   Patient's SCr is 0.76 mg/dL 4. Did SCr increase >/= 0.5 in 24 hours? No. 5.Pt's weight >40kg  Yes.   6. Abnormal electrolyte(s):   K 3.4, Mg 1.8  7. Electrolytes replaced per protocol 8.  Call MD STAT for K+ </= 2.5, Phos </= 1, or Mag </= 1 Physician:  A. Fredricka Bonine 04/14/2022 5:38 AM

## 2022-04-14 NOTE — Telephone Encounter (Addendum)
Noted  

## 2022-04-14 NOTE — Progress Notes (Signed)
ANTICOAGULATION CONSULT NOTE Pharmacy Consult for heparin Indication:  dural venous sinus thrombosis Brief A/P: Heparin level subtherapeutic Increase Heparin rate  Allergies  Allergen Reactions   Grass Pollen(K-O-R-T-Swt Vern)    Patient Measurements: Weight: 105.5 kg (232 lb 9.4 oz)Actual body weight: 86.2 kg IBW: 52.4 kg Heparin Dosing Weight:   71.7 kg  Vital Signs: Temp: 97.7 F (36.5 C) (03/08 0800) Temp Source: Axillary (03/08 0800) BP: 107/69 (03/08 1200) Pulse Rate: 79 (03/08 1200)  Labs: Recent Labs    04/13/22 2035 04/13/22 2041 04/14/22 0442 04/14/22 1253  HGB 14.9 15.3* 16.6*  --   HCT 43.7 45.0 48.4*  --   PLT 326  --  313  --   APTT 30  --   --   --   LABPROT 13.9  --   --   --   INR 1.1  --   --   --   HEPARINUNFRC  --   --  <0.10* <0.10*  CREATININE 0.78 0.60 0.76  --     Estimated Creatinine Clearance: 98.8 mL/min (by C-G formula based on SCr of 0.76 mg/dL).   Assessment: 52 yo female with dural venous sinus thrombosis and subarachnoid hemorrhage for heparin  Low goal , no boluses HL < 0.10 (subtherapeutic)  Hgb 16.6/Plt 313  Goal of Therapy:  Heparin level 0.3-0.5 Monitor platelets by anticoagulation protocol: Yes   Plan:  Increase Heparin 1150 units/hr Check heparin level in 8 hours.  F/u CBC, HL, and s/sx bleeding  Daily HL  Wilson Singer, PharmD Clinical Pharmacist 04/14/2022 1:35 PM

## 2022-04-14 NOTE — Progress Notes (Signed)
LTM EEG disconnected - no skin breakdown at unhook. [facility] notified.  

## 2022-04-15 ENCOUNTER — Inpatient Hospital Stay (HOSPITAL_COMMUNITY): Payer: Medicaid Other

## 2022-04-15 DIAGNOSIS — I6389 Other cerebral infarction: Secondary | ICD-10-CM | POA: Diagnosis not present

## 2022-04-15 DIAGNOSIS — I8290 Acute embolism and thrombosis of unspecified vein: Secondary | ICD-10-CM

## 2022-04-15 DIAGNOSIS — I639 Cerebral infarction, unspecified: Secondary | ICD-10-CM | POA: Diagnosis not present

## 2022-04-15 DIAGNOSIS — I609 Nontraumatic subarachnoid hemorrhage, unspecified: Secondary | ICD-10-CM | POA: Diagnosis not present

## 2022-04-15 LAB — CK TOTAL AND CKMB (NOT AT ARMC)
CK, MB: 1.4 ng/mL (ref 0.5–5.0)
Relative Index: 0.9 (ref 0.0–2.5)
Total CK: 160 U/L (ref 38–234)

## 2022-04-15 LAB — CBC WITH DIFFERENTIAL/PLATELET
Abs Immature Granulocytes: 0.02 10*3/uL (ref 0.00–0.07)
Basophils Absolute: 0.1 10*3/uL (ref 0.0–0.1)
Basophils Relative: 1 %
Eosinophils Absolute: 0.1 10*3/uL (ref 0.0–0.5)
Eosinophils Relative: 1 %
HCT: 40.5 % (ref 36.0–46.0)
Hemoglobin: 13.3 g/dL (ref 12.0–15.0)
Immature Granulocytes: 0 %
Lymphocytes Relative: 36 %
Lymphs Abs: 2.6 10*3/uL (ref 0.7–4.0)
MCH: 28.4 pg (ref 26.0–34.0)
MCHC: 32.8 g/dL (ref 30.0–36.0)
MCV: 86.4 fL (ref 80.0–100.0)
Monocytes Absolute: 0.8 10*3/uL (ref 0.1–1.0)
Monocytes Relative: 11 %
Neutro Abs: 3.6 10*3/uL (ref 1.7–7.7)
Neutrophils Relative %: 51 %
Platelets: 298 10*3/uL (ref 150–400)
RBC: 4.69 MIL/uL (ref 3.87–5.11)
RDW: 13 % (ref 11.5–15.5)
WBC: 7.1 10*3/uL (ref 4.0–10.5)
nRBC: 0 % (ref 0.0–0.2)

## 2022-04-15 LAB — LIPID PANEL
Cholesterol: 154 mg/dL (ref 0–200)
HDL: 44 mg/dL (ref >40)
LDL Cholesterol: 93 mg/dL (ref 0–99)
Total CHOL/HDL Ratio: 3.5 RATIO
Triglycerides: 86 mg/dL (ref <150)
VLDL: 17 mg/dL (ref 0–40)

## 2022-04-15 LAB — ECHOCARDIOGRAM COMPLETE
AR max vel: 3.19 cm2
AV Area VTI: 3.22 cm2
AV Area mean vel: 3.08 cm2
AV Mean grad: 4 mmHg
AV Peak grad: 8.1 mmHg
Ao pk vel: 1.42 m/s
Area-P 1/2: 3.53 cm2
S' Lateral: 2.5 cm
Weight: 3753.11 oz

## 2022-04-15 LAB — TROPONIN I (HIGH SENSITIVITY): Troponin I (High Sensitivity): 7 ng/L (ref <18)

## 2022-04-15 LAB — COMPREHENSIVE METABOLIC PANEL
ALT: 21 U/L (ref 0–44)
AST: 20 U/L (ref 15–41)
Albumin: 3.4 g/dL — ABNORMAL LOW (ref 3.5–5.0)
Alkaline Phosphatase: 73 U/L (ref 38–126)
Anion gap: 7 (ref 5–15)
BUN: 5 mg/dL — ABNORMAL LOW (ref 6–20)
CO2: 24 mmol/L (ref 22–32)
Calcium: 8.9 mg/dL (ref 8.9–10.3)
Chloride: 103 mmol/L (ref 98–111)
Creatinine, Ser: 0.8 mg/dL (ref 0.44–1.00)
GFR, Estimated: >60 mL/min (ref >60)
Glucose, Bld: 99 mg/dL (ref 70–99)
Potassium: 3.1 mmol/L — ABNORMAL LOW (ref 3.5–5.1)
Sodium: 134 mmol/L — ABNORMAL LOW (ref 135–145)
Total Bilirubin: 0.6 mg/dL (ref 0.3–1.2)
Total Protein: 7.1 g/dL (ref 6.5–8.1)

## 2022-04-15 LAB — PHOSPHORUS: Phosphorus: 2.8 mg/dL (ref 2.5–4.6)

## 2022-04-15 LAB — MAGNESIUM: Magnesium: 1.9 mg/dL (ref 1.7–2.4)

## 2022-04-15 LAB — VITAMIN B12: Vitamin B-12: 139 pg/mL — ABNORMAL LOW (ref 180–914)

## 2022-04-15 LAB — LUPUS ANTICOAGULANT PANEL
DRVVT: 36.2 s (ref 0.0–47.0)
PTT Lupus Anticoagulant: 39.4 s (ref 0.0–43.5)

## 2022-04-15 LAB — HEPARIN LEVEL (UNFRACTIONATED)
Heparin Unfractionated: 0.36 IU/mL (ref 0.30–0.70)
Heparin Unfractionated: 0.32 IU/mL (ref 0.30–0.70)

## 2022-04-15 LAB — PROTEIN C ACTIVITY: Protein C Activity: 98 % (ref 73–180)

## 2022-04-15 LAB — BETA-2-GLYCOPROTEIN I ABS, IGG/M/A
Beta-2 Glyco I IgG: <9 GPI IgG units (ref 0–20)
Beta-2-Glycoprotein I IgA: <9 GPI IgA units (ref 0–25)
Beta-2-Glycoprotein I IgM: <9 GPI IgM units (ref 0–32)

## 2022-04-15 LAB — HOMOCYSTEINE: Homocysteine: 14.7 umol/L — ABNORMAL HIGH (ref 0.0–14.5)

## 2022-04-15 LAB — PROTEIN S ACTIVITY: Protein S Activity: 68 % (ref 63–140)

## 2022-04-15 LAB — PROTEIN S, TOTAL: Protein S Ag, Total: 80 % (ref 60–150)

## 2022-04-15 LAB — PROTEIN C, TOTAL: Protein C, Total: 70 % (ref 60–150)

## 2022-04-15 LAB — LACTIC ACID, PLASMA: Lactic Acid, Venous: 1 mmol/L (ref 0.5–1.9)

## 2022-04-15 LAB — FOLATE: Folate: 13.1 ng/mL (ref >5.9)

## 2022-04-15 MED ORDER — IOHEXOL 9 MG/ML PO SOLN
500.0000 mL | ORAL | Status: AC
  Administered 2022-04-15 (×2): 500 mL via ORAL

## 2022-04-15 MED ORDER — TRAMADOL HCL 50 MG PO TABS
50.0000 mg | ORAL_TABLET | Freq: Four times a day (QID) | ORAL | Status: DC | PRN
  Administered 2022-04-15 – 2022-04-19 (×12): 50 mg via ORAL
  Filled 2022-04-15 (×13): qty 1

## 2022-04-15 MED ORDER — POTASSIUM CHLORIDE CRYS ER 20 MEQ PO TBCR
40.0000 meq | EXTENDED_RELEASE_TABLET | ORAL | Status: AC
  Administered 2022-04-15 (×2): 40 meq via ORAL
  Filled 2022-04-15 (×2): qty 2

## 2022-04-15 MED ORDER — IOHEXOL 350 MG/ML SOLN
75.0000 mL | Freq: Once | INTRAVENOUS | Status: AC | PRN
  Administered 2022-04-15: 75 mL via INTRAVENOUS

## 2022-04-15 MED ORDER — PROCHLORPERAZINE EDISYLATE 10 MG/2ML IJ SOLN: 10.0000 mg | Freq: Four times a day (QID) | INTRAMUSCULAR | Status: DC | PRN

## 2022-04-15 MED ORDER — SODIUM CHLORIDE 0.9 % IV SOLN
12.5000 mg | Freq: Once | INTRAVENOUS | Status: AC
  Administered 2022-04-15: 12.5 mg via INTRAVENOUS
  Filled 2022-04-15: qty 0.5

## 2022-04-15 NOTE — Progress Notes (Signed)
[facility] Progress Note Patient: Catherine Kane [id number] DOB: [birth year] DOA: 04/13/2022  DOS: the patient was seen and examined on 04/15/2022  Brief hospital course: PMH of HTN, migraine, anxiety, asthma, basal deficiency, vitamin D deficiency, substance abuse.  To the hospital with complaints of right-sided weakness and headache. Originally presented on 04/11/2022 to ED with headache ongoing for 3 days with vomiting.  CT of the head was unremarkable.  Treated symptomatically with improvement in symptoms.  Was discharged on oral Zofran. Return on 3/07-2022 with complaints of right side of the face ear and neck hurting with right ear muffled feeling.  Had right TM erythema with bulging concerning for possible infection treated with Augmentin.  COVID flu and RSV was negative.  Omnicef was sent by PCP outpatient but patient left AMA. Return to ER on 3/7 due to persistent right-sided numbness.  Now admitted with a diagnosis of [place] as well as dural venous sinus thrombosis.  Initially in the ICU.  Neurology consulted.  Started on IV heparin.  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 Presents with recurrent headache likely going since the beginning of the March. Found to have occlusive thrombus in the right transverse and sigmoid sinuses as well as near occlusive thrombus extending from vertex superior sagittal sinus to torcula. CT angio negative for any large vessel occlusion. MRI brain shows venous infarct. Restricted diffusion in the medial left thalamus-seizure? 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 Neurology was consulted. Patient was started on IV heparin as the risk for progressive decline without anticoagulation outweighed the risk for worsening SAH per neurology. Currently no focal deficit. Still has photophobia and headache. Will add pain medications for headache treatment. Continue Fioricet. Monitor for  now. Currently on Keppra for seizure prophylaxis for 7 days.  Last day 3/15.  Hypercoagulable state. Patient presents with dural venous sinus thrombosis. Workup initiated for hypercoagulable state. So far workup is unremarkable and etiology is unclear. CT chest abdomen pelvis negative for any malignant site or any other source of thromboembolism. Reported to have some right ear infection although suspect that his swelling and erythema in the setting of venous sinus thrombosis. Patient is positive for cocaine but no other substance abuse reported. No other fever chills or any other infectious symptoms reported. No other hardware reported by the patient. No prior history of any inflammatory condition. COVID is negative. Continue further workup.  Hypertensive emergency. Blood pressure was elevated. Currently on Norvasc,  Reportedly on carvedilol Diovan HCTZ both currently on hold. Monitor.  GERD. Continue PPI.  Hypokalemia. Currently being treated.  Mood disorder. On Prozac. Continue. On chlorphentermine.  Will confirm home regimen and initiate.  HLD. On Lipitor 40 mg daily. Continue.  Substance abuse. Patient is cocaine positive. Counseled the patient to avoid any substance abuse in future.  Class 3 obesity Body mass index is 41.55 kg/m.  Placing the patient at high risk of poor outcome.  Subjective: Reports photophobia.  Also reports nausea but no vomiting.  No abdominal pain.  Passing gas but no BM.  No fever no chills.  Minimal oral intake due to ongoing nausea.  Physical Exam: General: in Mild distress, No Rash Cardiovascular: S1 and S2 Present, No Murmur Respiratory: Good respiratory effort, Bilateral Air entry present. No Crackles, No wheezes Abdomen: Bowel Sound present, No tenderness Extremities: No edema Neuro: Alert and oriented x3, no new focal deficit, photophobia.  Pupils are reactive to light.  Extraocular muscle movement present.  Finger-nose-finger  normal bilaterally motor strength equal bilaterally.  Data Reviewed: I have Reviewed nursing notes, Vitals, and Lab results. Since last encounter, pertinent lab results CBC and BMP   . I have ordered test including 123456, B1, folic acid, blood culture CBC and BMP  . I have ordered imaging lower extremity venous Doppler  .   Disposition: Status is: Inpatient Remains inpatient appropriate because: Currently on IV heparin.  SCDs Start: 04/13/22 2229   Family Communication: Significant other at bedside Level of care: Telemetry Medical due to hemorrhagic stroke. Vitals:   04/15/22 0600 04/15/22 0700 04/15/22 0800 04/15/22 0900  BP: 122/73 128/81 104/70 (!) 138/93  Pulse: 71 81 81 70  Resp: '20 18 17 15  '$ Temp:      TempSrc:      SpO2: 95% 98% 95% 96%  Weight:         Author: Berle Mull, MD 04/15/2022 9:03 AM  Please look on [email or web address] to find out who is on call.

## 2022-04-15 NOTE — Evaluation (Signed)
Occupational Therapy Evaluation Patient Details Name: Catherine Kane MRN: [id number] DOB: 11-14-1970 Today's Date: 04/15/2022   History of Present Illness 52 y/o female who presents on 3/7 with headache, speech difficulty, right sided numbness. 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 Questionable signal abnormality in the medial left thalamus which may be related to seizure. + cocaine. PMH includes migraines, essential hypertension, anxiety and a history of COVID.   Clinical Impression   PT admitted with CVA. Pt currently with functional limitiations due to the deficits listed below (see OT problem list). Pt at baseline indep. Pt reports she has not been working and had trouble due to breathing ( coughing). Pt reports they are un housed and living week to week at a motel. Pt and fiance do have access to a car. Pt reports family lives in [place] area and they are on their on in [place]. Pt demonstrates R UE/ LE deficits and coordination deficits.  Pt will benefit from skilled OT to increase their independence and safety with adls and balance to allow discharge CIR.       Recommendations for follow up therapy are one component of a multi-disciplinary discharge planning process, led by the attending physician.  Recommendations may be updated based on patient status, additional functional criteria and insurance authorization.   Follow Up Recommendations  Acute inpatient rehab (3hours/day)     Assistance Recommended at Discharge Set up Supervision/Assistance  Patient can return home with the following A little help with walking and/or transfers;A little help with bathing/dressing/bathroom    Functional Status Assessment  Patient has had a recent decline in their functional status and demonstrates the ability to make significant  improvements in function in a reasonable and predictable amount of time.  Equipment Recommendations  Other (comment) (RW)    Recommendations for Other Services Rehab consult     Precautions / Restrictions Precautions Precautions: Fall;Other (comment) Precaution Comments: SBP 130-150, photophobia Restrictions Weight Bearing Restrictions: No      Mobility Bed Mobility Overal bed mobility: Needs Assistance Bed Mobility: Rolling, Sidelying to Sit, Sit to Supine Rolling: Min guard Sidelying to sit: HOB elevated, Min assist   Sit to supine: Min guard, HOB elevated   General bed mobility comments: Able to get to EOB with increased time, use of rail and Min A for trunk, "wooziness" reported. Cues for gaze stabilization. Able to return to supine without assist.    Transfers Overall transfer level: Needs assistance Equipment used: None, Rolling walker (2 wheels) Transfers: Sit to/from Stand Sit to Stand: Min assist, +2 safety/equipment           General transfer comment: Assist to power to standing without use of RW initially from EOB x1, right knee buckling/unstable. Grabbed RW for UE support.      Balance Overall balance assessment: Needs assistance Sitting-balance support: Feet supported, No upper extremity supported Sitting balance-Leahy Scale: Fair Sitting balance - Comments: supervision for safety, no dynamic activities performed due to wooziness.   Standing balance support: During functional activity, Single extremity supported Standing balance-Leahy Scale: Poor Standing balance comment: Requires at least 1 UE support, able to wash hands at sink with close Min gaurd-Min A                           ADL either performed or assessed with clinical judgement  ADL Overall ADL's : Needs assistance/impaired Eating/Feeding: Set up;Sitting   Grooming: Wash/dry hands;Minimal assistance;Standing   Upper Body Bathing: Minimal assistance;Sitting   Lower Body  Bathing: Moderate assistance;Sit to/from stand           Toilet Transfer: Minimal assistance;Ambulation;Regular Toilet;Rolling walker (2 wheels)   Toileting- Clothing Manipulation and Hygiene: Min guard;Sitting/lateral lean Toileting - Clothing Manipulation Details (indicate cue type and reason): pt states i couldnt tell if i was still holding the paper or not     Functional mobility during ADLs: Minimal assistance;Rolling walker (2 wheels)       Vision Baseline Vision/History: 3 Glaucoma (reports MD wanted to send to specialist for possible glaucoma) Ability to See in Adequate Light: 1 Impaired Patient Visual Report: No change from baseline Additional Comments: wears glasses at baseline. Pt does not complain of visual changes at this time. pt visually scanning adl task appropriately this session.     Perception     Praxis      Pertinent Vitals/Pain Pain Assessment Pain Assessment: Faces Faces Pain Scale: Hurts little more Pain Location: head Pain Descriptors / Indicators: Headache Pain Intervention(s): Monitored during session, Repositioned     Hand Dominance Right   Extremity/Trunk Assessment Upper Extremity Assessment Upper Extremity Assessment: RUE deficits/detail RUE Deficits / Details: WFL ROM, decrease sensation, decreased coordination with hand to face   Lower Extremity Assessment Lower Extremity Assessment: RLE deficits/detail RLE Deficits / Details: Grossly ~2+/5 hip flexion, 3/5 knee extension/flexion, ankle DF/PF 3/5. RLE Sensation: decreased light touch   Cervical / Trunk Assessment Cervical / Trunk Assessment: Normal   Communication Communication Communication: No difficulties   Cognition Arousal/Alertness: Awake/alert Behavior During Therapy: WFL for tasks assessed/performed Overall Cognitive Status: Within Functional Limits for tasks assessed                                       General Comments  Fiance present in room during  session. VSS on RA. BP 130s/90s    Exercises     Shoulder Instructions      Home Living Family/patient expects to be discharged to:: Other (Comment) (motel) Living Arrangements: Spouse/significant other Available Help at Discharge: Family;Available PRN/intermittently Type of Home: Other(Comment) (motel) Home Access: Level entry     Home Layout: One level     Bathroom Shower/Tub: Tub/shower unit;Walk-in shower   Bathroom Toilet: Standard     Home Equipment: None   Additional Comments: pt does report some food insecurities      Prior Functioning/Environment Prior Level of Function : Independent/Modified Independent             Mobility Comments: Independent, fiance works part time to pay for motel ADLs Comments: independent, have access to a car        OT Problem List: Decreased strength;Decreased activity tolerance;Impaired balance (sitting and/or standing);Decreased safety awareness;Decreased knowledge of use of DME or AE;Decreased knowledge of precautions;Impaired UE functional use      OT Treatment/Interventions: Self-care/ADL training;Therapeutic exercise;Neuromuscular education;DME and/or AE instruction;Therapeutic activities;Patient/family education;Balance training;Manual therapy;Modalities    OT Goals(Current goals can be found in the care plan section) Acute Rehab OT Goals Patient Stated Goal: to get stronger with R UE OT Goal Formulation: With patient Time For Goal Achievement: 04/29/22 Potential to Achieve Goals: Good  OT Frequency: Min 2X/week    Co-evaluation PT/OT/SLP Co-Evaluation/Treatment: Yes Reason for Co-Treatment: To address functional/ADL transfers;For patient/therapist safety   OT goals  addressed during session: ADL's and self-care;Proper use of Adaptive equipment and DME;Strengthening/ROM      AM-PAC OT "6 Clicks" Daily Activity     Outcome Measure Help from another person eating meals?: None Help from another person taking care  of personal grooming?: A Little Help from another person toileting, which includes using toliet, bedpan, or urinal?: A Little Help from another person bathing (including washing, rinsing, drying)?: A Little Help from another person to put on and taking off regular upper body clothing?: A Little Help from another person to put on and taking off regular lower body clothing?: A Little 6 Click Score: 19   End of Session Equipment Utilized During Treatment: Gait belt;Rolling walker (2 wheels) Nurse Communication: Mobility status;Precautions  Activity Tolerance: Patient tolerated treatment well Patient left: in bed;with call bell/phone within reach;with bed alarm set;with family/visitor present  OT Visit Diagnosis: Unsteadiness on feet (R26.81);Muscle weakness (generalized) (M62.81)                Time: [id number] OT Time Calculation (min): 32 min Charges:  OT General Charges $OT Visit: 1 Visit OT Evaluation $OT Eval Moderate Complexity: 1 Mod   Brynn, OTR/L  Acute Rehabilitation Services Office: [phone] .   Jeri Modena 04/15/2022, 1:51 PM

## 2022-04-15 NOTE — Progress Notes (Signed)
Pt down in CT for scan at 0300. Per Elta Guadeloupe (CT tech), pt's Midline access "not power injection, hub not numbered" although Midline has good blood return and flushes good. Pt's heparin gtt moved from PIV to midline. Pt able to safety get CT, but heparin gtt interrupted.

## 2022-04-15 NOTE — Progress Notes (Signed)
STROKE TEAM PROGRESS NOTE   INTERVAL HISTORY  Transferred to floor yesterday that is still pending.  Heparin drip ongoing.  Family at bedside.  She is doing well has a headache at 5 out of 10 improved from yesterday which is a 7.  Headaches are worse when she is sitting up.  No double vision.  No new weakness.  No problems with dizziness or nausea or vomiting.  Denies drug use despite being positive cocaine.  Last time she uses she said was several years ago.  She does have friends that use cocaine who visited her recently.   Vitals:   04/15/22 0600 04/15/22 0700 04/15/22 0800 04/15/22 0900  BP: 122/73 128/81 104/70 (!) 138/93  Pulse: 71 81 81 70  Resp: '20 18 17 15  '$ Temp:   98.3 F (36.8 C)   TempSrc:   Axillary   SpO2: 95% 98% 95% 96%  Weight:       CBC:  Recent Labs  Lab 04/14/22 0442 04/15/22 0513  WBC 8.4 7.1  NEUTROABS 5.6 3.6  HGB 16.6* 13.3  HCT 48.4* 40.5  MCV 85.4 86.4  PLT 313 [id number]    Basic Metabolic Panel:  Recent Labs  Lab 04/14/22 0442 04/15/22 0513  NA 137 134*  K 3.4* 3.1*  CL 102 103  CO2 20* 24  GLUCOSE 79 99  BUN 5* 5*  CREATININE 0.76 0.80  CALCIUM 9.8 8.9  MG 1.8 1.9  PHOS 3.4 2.8    Lipid Panel:  Recent Labs  Lab 04/15/22 0513  CHOL 154  TRIG 86  HDL 44  CHOLHDL 3.5  VLDL 17  LDLCALC 93   HgbA1c: No results for input(s): "HGBA1C" in the last 168 hours. Urine Drug Screen:  Recent Labs  Lab 04/13/22 2221  LABOPIA NONE DETECTED  COCAINSCRNUR POSITIVE*  LABBENZ NONE DETECTED  AMPHETMU NONE DETECTED  THCU NONE DETECTED  LABBARB NONE DETECTED     Alcohol Level  Recent Labs  Lab 04/13/22 2035  ETH <10     IMAGING past 24 hours CT CHEST ABDOMEN PELVIS W CONTRAST  Result Date: 04/15/2022 CLINICAL DATA:  Occlusive thrombus in the right transverse and sigmoid sinuses and in part of the superior sagittal sinus with proximal occlusion of the right IJ vein. Reference CT head venogram 04/13/2022. EXAM: CT CHEST, ABDOMEN, AND  PELVIS WITH CONTRAST TECHNIQUE: Multidetector CT imaging of the chest, abdomen and pelvis was performed following the standard protocol during bolus administration of intravenous contrast. RADIATION DOSE REDUCTION: This exam was performed according to the departmental dose-optimization program which includes automated exposure control, adjustment of the mA and/or kV according to patient size and/or use of iterative reconstruction technique. CONTRAST:  16m OMNIPAQUE IOHEXOL 350 MG/ML SOLN COMPARISON:  CT abdomen and pelvis without contrast 01/10/2016. FINDINGS: CT CHEST FINDINGS Cardiovascular: The heart is slightly enlarged. Pulmonary arteries and veins are normal caliber. Pulmonary arteries are centrally clear There is no pericardial effusion. There are no visible coronary artery calcifications. There is minimal calcific plaque in the distal aortic arch. The aorta and great vessels are otherwise unremarkable. No focal thrombosis or occlusion is seen in the axillary veins, both subclavian veins, brachiocephalic arch and SVC, or in the visualized inferior portions of the internal jugular veins. Mediastinum/Nodes: There is a 6 mm densely calcified nodule in the left lobe of thyroid gland. No follow-up imaging is recommended. Remainder of the thyroid is unremarkable. There is no axillary or intrathoracic adenopathy. There is minimal residual thymus in the substernal  mediastinum. The thoracic trachea and thoracic esophagus are unremarkable. Lungs/Pleura: Eventration and mild elevation right hemidiaphragm. There are mild posterior atelectatic changes in the lungs. No infiltrate or nodule is seen. No significant bronchial thickening and no bronchiectasis. There is no pleural effusion, thickening or pneumothorax. Musculoskeletal: No chest wall mass or suspicious bone lesions identified. CT ABDOMEN PELVIS FINDINGS Hepatobiliary: No focal liver abnormality is seen. No calcified gallstones, gallbladder wall thickening, or  biliary dilatation. Pancreas: Unremarkable. No pancreatic ductal dilatation or surrounding inflammatory changes. Spleen: Normal in size without focal abnormality. Adrenals/Urinary Tract: Adrenal glands are unremarkable. Kidneys are normal, without renal calculi, focal lesion, or hydronephrosis. Bladder is unremarkable. Stomach/Bowel: No dilatation or wall thickening, including the appendix. Scattered uncomplicated sigmoid diverticulosis. Vascular/Lymphatic: There is no portal, splenic or SMV thrombosis. The IVC and pelvic deep veins show filling defects. The major proximal thigh veins are patent. No significant vascular findings are present. No enlarged abdominal or pelvic lymph nodes. Reproductive: The uterus is anteverted. There is interval enlargement of coalescent fundal heterogeneous masses compared with 2017, with a conglomerate fundal mass now measuring 10.8 by 7.8 by 8.4 cm, about 1/4 this size previously. This is most likely due to enlarging fibroid disease. Leiomyosarcoma could appear similar but is very rare. The ovaries are not enlarged. Other: There is no free air, free hemorrhage, free fluid or incarcerated hernias. There is a small umbilical fat hernia. Musculoskeletal: There is facet hypertrophy in the lower lumbar spine. No acute or significant osseous findings or destructive lesions. IMPRESSION: 1. No venous thrombosis is seen in the chest, abdomen or pelvis. 2. Minimal aortic atherosclerosis. 3. Slight cardiomegaly. 4. Uncomplicated sigmoid diverticulosis. 5. Small umbilical fat hernia. 6. Interval enlargement of coalescent fundal uterine masses most likely due to enlarging fibroid disease. Leiomyosarcoma could appear similar but is very rare. 7. Facet hypertrophy in the lower lumbar spine. Aortic Atherosclerosis (ICD10-I70.0). Electronically Signed   By: Telford Nab M.D.   On: 04/15/2022 03:53    PHYSICAL EXAM General no acute distress.  Appears comfortable. Respiratory:  Nonlabored   Neuro: Mental Status: Alert and awake.  Oriented to person place and time.  Follows all commands.  No aphasia. Cranial Nerves: II: Visual Fields are full. Pupils are equal, round, and reactive to light.   III,IV, VI: EOMI without ptosis or diploplia.  V: Facial sensation is symmetric to temperature VII: Facial movement is symmetric resting and smiling VIII: Hearing is intact to voice   Motor:  Right upper extremity 4+/5-no drift. RLE 5/5 Left side upper and lower 5/5 Normal tone and bulk.  Sensory: Patient intact to light touch in all 4 extremities.  Cerebellar: FNF intact bilaterally.  No ataxia   ASSESSMENT/PLAN Ms. Marja Lise Kovalchik is a 52 y.o. female with history of anxiety hypertension vitamin B and D deficiency presented to the emergency room for evaluation of ongoing headache for the past 3 days.   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 Diffusely hyperdense appearance of the dural venous sinuses raises the possibility for dural venous sinus thrombosis.  CTA/CTV- Occlusive thrombus in the right transverse and sigmoid sinuses, with near occlusive thrombus extending from the vertex superior sagittal sinus to the torcula. The anterior portion of the superior sagittal sinus is patent. The inferior sagittal sinus is somewhat diminutive but appears patent. MRI- Extensive dural venous sinus thrombosis involving the superior sagittal and  right transverse/sigmoid sinuses. 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 Questionable signal abnormality in the medial left thalamus which may be related to seizure. Repeat CT Head- Extensive dural venous sinus thrombosis as demonstrated by CTV/MRI yesterday. Probable venous infarct left parietal lobe with trace  subarachnoid blood versus cortical vein thrombus is stable EEG- This study is within normal limits. No seizures or epileptiform discharges were seen throughout the recording.  2D Echo completed results pending LDL 93 HgbA1c pending UDS + for cocaine Hypercoagulable labs and ANA pending And CT negative for malignancy.  Possible uterine fibroids. VTE prophylaxis - Heparin IV aspirin 81 mg daily prior to admission, now on heparin IV.  On keppra for seizure prevention for 7 days Therapy recommendations:  pending  Disposition:  pending   Hypertension Home meds:  Norvasc '10mg'$ , Coreg 12.'5mg'$ , valsartan-hydrochlorothiazide Stable BP goal < 160 IV Cleviprex - currently off Norvasc resumed   Hyperlipidemia Home meds:  Atorvastatin '10mg'$ , resumed in hospital LDL 93, goal < 70 Increase to '40mg'$  Continue statin at discharge  Tobacco abuse Current smoker Smoking cessation counseling provided Pt is willing to quit  Cocaine abuse UDS + cocaine Cessation education provided Pt is willing to quit  Other Stroke Risk Factors Obesity, Body mass index is 41.55 kg/m., BMI >/= 30 associated with increased stroke risk, recommend weight loss, diet and exercise as appropriate   Other Active Problems Hypokalemia K 2.7 -> 3.4- replaced  Hospital day # 2  Egbert Garibaldi, MD     To contact Stroke Continuity provider, please refer to [email or web address] After hours, contact General Neurology

## 2022-04-15 NOTE — Progress Notes (Signed)
ANTICOAGULATION CONSULT NOTE  Pharmacy Consult for heparin Indication:  dural venous sinus thrombosis  Allergies  Allergen Reactions   Grass Pollen(K-O-R-T-Swt Vern)    Patient Measurements: Weight: 106.4 kg (234 lb 9.1 oz) Actual body weight: 86.2 kg IBW: 52.4 kg Heparin Dosing Weight:   71.7 kg  Vital Signs: Temp: 98.3 F (36.8 C) (03/09 0400) Temp Source: Axillary (03/09 0400) BP: 104/70 (03/09 0800) Pulse Rate: 81 (03/09 0800)  Labs: Recent Labs    04/13/22 2035 04/13/22 2041 04/13/22 2041 04/14/22 0442 04/14/22 1253 04/14/22 2034 04/15/22 0513 04/15/22 0807  HGB 14.9 15.3*  --  16.6*  --   --  13.3  --   HCT 43.7 45.0  --  48.4*  --   --  40.5  --   PLT 326  --   --  313  --   --  298  --   APTT 30  --   --   --   --   --   --   --   LABPROT 13.9  --   --   --   --   --   --   --   INR 1.1  --   --   --   --   --   --   --   HEPARINUNFRC  --   --    < > <0.10* <0.10* <0.10*  --  0.36  CREATININE 0.78 0.60  --  0.76  --   --  0.80  --   CKTOTAL  --   --   --   --   --   --  160  --   CKMB  --   --   --   --   --   --  1.4  --   TROPONINIHS  --   --   --   --   --   --  7  --    < > = values in this interval not displayed.    Estimated Creatinine Clearance: 99.4 mL/min (by C-G formula based on SCr of 0.8 mg/dL).   Assessment: 52 yo female with dural venous sinus thrombosis and subarachnoid hemorrhage. Pharmacy consulted for heparin.   Heparin level therapeutic; no bleeding reported, CBC stable.  Goal of Therapy:  Heparin level 0.3-0.5  units/mL Monitor platelets by anticoagulation protocol: Yes   Plan:  Continue heparin infusion at 1300 units/hr Check confirmatory heparin level  Beckem Tomberlin D. Mina Marble, PharmD, BCPS, [place] 04/15/2022, 9:02 AM

## 2022-04-15 NOTE — Evaluation (Signed)
Physical Therapy Evaluation Patient Details Name: Catherine Kane MRN: [id number] DOB: [birth year] Today's Date: 04/15/2022  History of Present Illness  Patient is a 52 y/o female who presents on 3/7 with headache, speech difficulty, right sided numbness. 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 Questionable signal abnormality in the medial left thalamus which may be related to seizure. + cocaine. PMH includes migraines, essential hypertension, anxiety and a history of COVID.  Clinical Impression  Patient presents with right hemiparesis, nausea, headache, photophobia, impaired balance and impaired mobility s/p above. Pt lives with her fiance in a motel they pay for weekly and is independent with ADLs/IADLs PTA. Pt does not work but fiance works part time. Today, pt requires Min A for transfers and gait training due to right sided weakness and right knee instability. Cues provided for gaze stabilization to decrease nausea and wooziness. Very supportive spouse.  Encouraged OOB to chair for all meals and walking to bathroom with nursing using RW. Would benefit from AIR to maximize independence and mobility prior to return home. Will follow acutely.     Recommendations for follow up therapy are one component of a multi-disciplinary discharge planning process, led by the attending physician.  Recommendations may be updated based on patient status, additional functional criteria and insurance authorization.  Follow Up Recommendations Acute inpatient rehab (3hours/day)      Assistance Recommended at Discharge Frequent or constant Supervision/Assistance  Patient can return home with the following  A little help with walking and/or transfers;A little help with bathing/dressing/bathroom;Help with stairs or ramp for entrance;Assistance with cooking/housework;Assist for  transportation    Equipment Recommendations Rolling walker (2 wheels)  Recommendations for Other Services  Rehab consult    Functional Status Assessment Patient has had a recent decline in their functional status and demonstrates the ability to make significant improvements in function in a reasonable and predictable amount of time.     Precautions / Restrictions Precautions Precautions: Fall;Other (comment) Precaution Comments: SBP 130-150, photophobia Restrictions Weight Bearing Restrictions: No      Mobility  Bed Mobility Overal bed mobility: Needs Assistance Bed Mobility: Rolling, Sidelying to Sit, Sit to Supine Rolling: Min guard Sidelying to sit: HOB elevated, Min assist   Sit to supine: Min guard, HOB elevated   General bed mobility comments: Able to get to EOB with increased time, use of rail and Min A for trunk, "wooziness" reported. Cues for gaze stabilization. Able to return to supine without assist.    Transfers Overall transfer level: Needs assistance Equipment used: None, Rolling walker (2 wheels) Transfers: Sit to/from Stand Sit to Stand: Min assist, +2 safety/equipment           General transfer comment: Assist to power to standing without use of RW initially from EOB x1, right knee buckling/unstable. Grabbed RW for UE support.    Ambulation/Gait Ambulation/Gait assistance: Min assist, Min guard Gait Distance (Feet): 20 Feet (x2 bouts) Assistive device: Rolling walker (2 wheels) Gait Pattern/deviations: Step-through pattern, Decreased stride length Gait velocity: decreased Gait velocity interpretation: <1.31 ft/sec, indicative of household ambulator   General Gait Details: Slow, mildly unsteady gait with right knee instability and assist for RW management esp right hand placement/awareness at times.  Stairs            Wheelchair Mobility    Modified Rankin (Stroke Patients Only) Modified Rankin (Stroke Patients Only) Pre-Morbid Rankin  Score: No  symptoms Modified Rankin: Moderately severe disability     Balance Overall balance assessment: Needs assistance Sitting-balance support: Feet supported, No upper extremity supported Sitting balance-Leahy Scale: Fair Sitting balance - Comments: supervision for safety, no dynamic activities performed due to wooziness.   Standing balance support: During functional activity, Single extremity supported Standing balance-Leahy Scale: Poor Standing balance comment: Requires at least 1 UE support, able to wash hands at sink with close Min gaurd-Min A                             Pertinent Vitals/Pain Pain Assessment Pain Assessment: Faces Faces Pain Scale: Hurts little more Pain Location: head Pain Descriptors / Indicators: Headache Pain Intervention(s): Monitored during session, Repositioned    Home Living Family/patient expects to be discharged to:: Other (Comment) (motel) Living Arrangements: Spouse/significant other Available Help at Discharge: Family;Available PRN/intermittently Type of Home: Other(Comment) (motel) Home Access: Level entry       Home Layout: One level Home Equipment: None      Prior Function Prior Level of Function : Independent/Modified Independent             Mobility Comments: Independent, fiance works part time to pay for [employer] ADLs Comments: independent, have access to a car     Hand Dominance   Dominant Hand: Right    Extremity/Trunk Assessment   Upper Extremity Assessment Upper Extremity Assessment: Defer to OT evaluation    Lower Extremity Assessment Lower Extremity Assessment: RLE deficits/detail RLE Deficits / Details: Grossly ~2+/5 hip flexion, 3/5 knee extension/flexion, ankle DF/PF 3/5. RLE Sensation: decreased light touch    Cervical / Trunk Assessment Cervical / Trunk Assessment: Normal  Communication   Communication: No difficulties  Cognition Arousal/Alertness: Awake/alert Behavior During Therapy:  WFL for tasks assessed/performed Overall Cognitive Status: Within Functional Limits for tasks assessed                                          General Comments General comments (skin integrity, edema, etc.): Fiance present in room during session. VSS on RA. BP 130s/90s    Exercises     Assessment/Plan    PT Assessment Patient needs continued PT services  PT Problem List Decreased strength;Decreased range of motion;Decreased mobility;Impaired tone;Pain;Decreased balance;Impaired sensation;Decreased knowledge of use of DME;Decreased coordination       PT Treatment Interventions Therapeutic activities;Gait training;Therapeutic exercise;Patient/family education;Balance training;Functional mobility training;Neuromuscular re-education;DME instruction    PT Goals (Current goals can be found in the Care Plan section)  Acute Rehab PT Goals Patient Stated Goal: to get better, return to PLOF PT Goal Formulation: With patient/family Time For Goal Achievement: 04/29/22 Potential to Achieve Goals: Good    Frequency Min 4X/week     Co-evaluation               AM-PAC PT "6 Clicks" Mobility  Outcome Measure Help needed turning from your back to your side while in a flat bed without using bedrails?: A Little Help needed moving from lying on your back to sitting on the side of a flat bed without using bedrails?: A Little Help needed moving to and from a bed to a chair (including a wheelchair)?: A Little Help needed standing up from a chair using your arms (e.g., wheelchair or bedside chair)?: A Little Help needed to walk in hospital room?: A Little Help needed  climbing 3-5 steps with a railing? : A Lot 6 Click Score: 17    End of Session Equipment Utilized During Treatment: Gait belt Activity Tolerance: Patient tolerated treatment well Patient left: in bed;with call bell/phone within reach;with family/visitor present Nurse Communication: Mobility status PT Visit  Diagnosis: Pain;Hemiplegia and hemiparesis;Muscle weakness (generalized) (M62.81);Unsteadiness on feet (R26.81);Difficulty in walking, not elsewhere classified (R26.2) Hemiplegia - Right/Left: Right Hemiplegia - dominant/non-dominant: Dominant Hemiplegia - caused by: Cerebral infarction Pain - part of body:  (head)    Time: [id number] PT Time Calculation (min) (ACUTE ONLY): 34 min   Charges:   PT Evaluation $PT Eval Moderate Complexity: 1 Mod          Marisa Severin, PT, DPT Acute Rehabilitation Services Secure chat preferred Office [place] 04/15/2022, 1:25 PM

## 2022-04-15 NOTE — Hospital Course (Signed)
PMH of HTN, migraine, anxiety, asthma, basal deficiency, vitamin D deficiency, substance abuse.  To the hospital with complaints of right-sided weakness and headache. Originally presented on 04/11/2022 to ED with headache ongoing for 3 days with vomiting.  CT of the head was unremarkable.  Treated symptomatically with improvement in symptoms.  Was discharged on oral Zofran. Return on 3/07-2022 with complaints of right side of the face ear and neck hurting with right ear muffled feeling.  Had right TM erythema with bulging concerning for possible infection treated with Augmentin.  COVID flu and RSV was negative.  Omnicef was sent by PCP outpatient but patient left AMA. Return to ER on 3/7 due to persistent right-sided numbness.  Now admitted with a diagnosis of [place] as well as dural venous sinus thrombosis.  Initially in the ICU.  Neurology consulted.  Started on IV heparin.

## 2022-04-15 NOTE — Progress Notes (Signed)
ANTICOAGULATION CONSULT NOTE  Pharmacy Consult for heparin Indication:  dural venous sinus thrombosis  Allergies  Allergen Reactions   Grass Pollen(K-O-R-T-Swt Vern)    Patient Measurements: Weight: 106.4 kg (234 lb 9.1 oz) Actual body weight: 86.2 kg IBW: 52.4 kg Heparin Dosing Weight:   71.7 kg  Vital Signs: Temp: 98.2 F (36.8 C) (03/09 1200) Temp Source: Axillary (03/09 1200) BP: 117/66 (03/09 1200) Pulse Rate: 72 (03/09 1200)  Labs: Recent Labs    04/13/22 2035 04/13/22 2041 04/14/22 0442 04/14/22 1253 04/14/22 2034 04/15/22 0513 04/15/22 0807 04/15/22 1338  HGB 14.9 15.3* 16.6*  --   --  13.3  --   --   HCT 43.7 45.0 48.4*  --   --  40.5  --   --   PLT 326  --  313  --   --  298  --   --   APTT 30  --   --   --   --   --   --   --   LABPROT 13.9  --   --   --   --   --   --   --   INR 1.1  --   --   --   --   --   --   --   HEPARINUNFRC  --   --  <0.10*   < > <0.10*  --  0.36 0.32  CREATININE 0.78 0.60 0.76  --   --  0.80  --   --   CKTOTAL  --   --   --   --   --  160  --   --   CKMB  --   --   --   --   --  1.4  --   --   TROPONINIHS  --   --   --   --   --  7  --   --    < > = values in this interval not displayed.    Estimated Creatinine Clearance: 99.4 mL/min (by C-G formula based on SCr of 0.8 mg/dL).   Assessment: 52 yo female with dural venous sinus thrombosis and subarachnoid hemorrhage. Pharmacy consulted for heparin.   Heparin level therapeutic; no bleeding reported, CBC stable.  Goal of Therapy:  Heparin level 0.3-0.5  units/mL Monitor platelets by anticoagulation protocol: Yes   Plan:  Continue heparin infusion at 1300 units/hr Check confirmatory heparin level  Jonthan Leite D. Mina Marble, PharmD, BCPS, [place] 04/15/2022, 2:27 PM  ==============================  Addendum: Confirmatory heparin level therapeutic at 0.32 units/hr Increase heparin infusion slightly to 1350 units/hr to maintain therapeutic level F/U AM labs  Syon Tews D. Mina Marble, PharmD,  BCPS, [place] 04/15/2022, 2:28 PM

## 2022-04-15 NOTE — Progress Notes (Signed)
Bilateral lower extremity venous duplex has been completed. Preliminary results can be found in CV Proc through chart review.   04/15/22 12:04 PM Catherine Kane RVT

## 2022-04-15 NOTE — Progress Notes (Signed)
Pt due chest CT w/ oral contrast. Oral contrast not ordered. This RN contacted CT at [facility] to inquire about available spot for pt. After talking w/ CT rep at this time, it was discovered that the contrast was on the unit, but not order in the [facility] to admin med. Med to be entered into [facility] by CT rep I spoke with.

## 2022-04-15 NOTE — Progress Notes (Signed)
Inpatient Rehab Admissions Coordinator Note:   Per therapy patient was screened for CIR candidacy by Forrest Jaroszewski Danford Bad, CCC-SLP. At this time, pt appears to be a potential candidate for CIR. I will place an order for rehab consult for full assessment, per our protocol.  Please contact me any with questions.Gayland Curry, [place], [place] Admissions Coordinator [phone] 04/15/22 5:23 PM

## 2022-04-16 DIAGNOSIS — I639 Cerebral infarction, unspecified: Secondary | ICD-10-CM | POA: Diagnosis not present

## 2022-04-16 LAB — CBC WITH DIFFERENTIAL/PLATELET
Abs Immature Granulocytes: 0.06 10*3/uL (ref 0.00–0.07)
Basophils Absolute: 0.1 10*3/uL (ref 0.0–0.1)
Basophils Relative: 1 %
Eosinophils Absolute: 0.1 10*3/uL (ref 0.0–0.5)
Eosinophils Relative: 2 %
HCT: 42.1 % (ref 36.0–46.0)
Hemoglobin: 13.7 g/dL (ref 12.0–15.0)
Immature Granulocytes: 1 %
Lymphocytes Relative: 51 %
Lymphs Abs: 3.5 10*3/uL (ref 0.7–4.0)
MCH: 28.1 pg (ref 26.0–34.0)
MCHC: 32.5 g/dL (ref 30.0–36.0)
MCV: 86.4 fL (ref 80.0–100.0)
Monocytes Absolute: 0.6 10*3/uL (ref 0.1–1.0)
Monocytes Relative: 9 %
Neutro Abs: 2.4 10*3/uL (ref 1.7–7.7)
Neutrophils Relative %: 36 %
Platelets: 262 10*3/uL (ref 150–400)
RBC: 4.87 MIL/uL (ref 3.87–5.11)
RDW: 12.8 % (ref 11.5–15.5)
WBC: 6.8 10*3/uL (ref 4.0–10.5)
nRBC: 0 % (ref 0.0–0.2)

## 2022-04-16 LAB — BASIC METABOLIC PANEL
Anion gap: 11 (ref 5–15)
BUN: 6 mg/dL (ref 6–20)
CO2: 22 mmol/L (ref 22–32)
Calcium: 8.9 mg/dL (ref 8.9–10.3)
Chloride: 102 mmol/L (ref 98–111)
Creatinine, Ser: 0.82 mg/dL (ref 0.44–1.00)
GFR, Estimated: >60 mL/min (ref >60)
Glucose, Bld: 81 mg/dL (ref 70–99)
Potassium: 3.6 mmol/L (ref 3.5–5.1)
Sodium: 135 mmol/L (ref 135–145)

## 2022-04-16 LAB — PHOSPHORUS: Phosphorus: 2.8 mg/dL (ref 2.5–4.6)

## 2022-04-16 LAB — MAGNESIUM: Magnesium: 1.9 mg/dL (ref 1.7–2.4)

## 2022-04-16 LAB — CARDIOLIPIN ANTIBODIES, IGG, IGM, IGA
Anticardiolipin IgA: <9 APL U/mL (ref 0–11)
Anticardiolipin IgG: <9 GPL U/mL (ref 0–14)
Anticardiolipin IgM: <9 MPL U/mL (ref 0–12)

## 2022-04-16 LAB — HEPARIN LEVEL (UNFRACTIONATED): Heparin Unfractionated: 0.46 IU/mL (ref 0.30–0.70)

## 2022-04-16 MED ORDER — VITAMIN B-12 1000 MCG PO TABS
1000.0000 ug | ORAL_TABLET | Freq: Every day | ORAL | Status: DC
  Administered 2022-04-19: 1000 ug via ORAL
  Filled 2022-04-16: qty 1

## 2022-04-16 MED ORDER — CYANOCOBALAMIN 1000 MCG/ML IJ SOLN
1000.0000 ug | Freq: Every day | INTRAMUSCULAR | Status: AC
  Administered 2022-04-16 – 2022-04-18 (×3): 1000 ug via SUBCUTANEOUS
  Filled 2022-04-16 (×3): qty 1

## 2022-04-16 NOTE — Progress Notes (Signed)
Inpatient Rehab Admissions:  Inpatient Rehab Consult received.  I met with patient and fiance at the bedside for rehabilitation assessment and to discuss goals and expectations of an inpatient rehab admission.  Discussed average length of stay, insurance authorization requirement, discharge home after completion of CIR. Pt acknowledged understanding. Pt interested in pursuing CIR. Pt and fiance confirmed that pt's fiance and pt's daughter Gerarda Fraction will be able to provide 24/7 support after discharge. Will continue to follow.  Signed: Gayland Curry, [place], [place] Admissions Coordinator [phone]

## 2022-04-16 NOTE — Progress Notes (Addendum)
STROKE TEAM PROGRESS NOTE   INTERVAL HISTORY  Family at bedside.   Doing better this morning.  Headache still present but at 4/10.  Still worse with sitting up but otherwise no new symptoms such as weakness or vision loss.  She is able to eat and drink with no nausea vomiting.  She gets a little dizzy when sitting up but not present when laying.  Vitals:   04/16/22 0400 04/16/22 0557 04/16/22 0800 04/16/22 0835  BP:  129/76  136/79  Pulse: 64     Resp:      Temp: 98 F (36.7 C)  98.3 F (36.8 C)   TempSrc: Oral  Oral   SpO2: 99%     Weight:       CBC:  Recent Labs  Lab 04/15/22 0513 04/16/22 0721  WBC 7.1 6.8  NEUTROABS 3.6 2.4  HGB 13.3 13.7  HCT 40.5 42.1  MCV 86.4 86.4  PLT 298 [id number]    Basic Metabolic Panel:  Recent Labs  Lab 04/15/22 0513 04/16/22 0721  NA 134* 135  K 3.1* 3.6  CL 103 102  CO2 24 22  GLUCOSE 99 81  BUN 5* 6  CREATININE 0.80 0.82  CALCIUM 8.9 8.9  MG 1.9 1.9  PHOS 2.8 2.8    Lipid Panel:  Recent Labs  Lab 04/15/22 0513  CHOL 154  TRIG 86  HDL 44  CHOLHDL 3.5  VLDL 17  LDLCALC 93    HgbA1c: No results for input(s): "HGBA1C" in the last 168 hours. Urine Drug Screen:  Recent Labs  Lab 04/13/22 2221  LABOPIA NONE DETECTED  COCAINSCRNUR POSITIVE*  LABBENZ NONE DETECTED  AMPHETMU NONE DETECTED  THCU NONE DETECTED  LABBARB NONE DETECTED     Alcohol Level  Recent Labs  Lab 04/13/22 2035  ETH <10     IMAGING past 24 hours ECHOCARDIOGRAM COMPLETE  Result Date: 04/15/2022    ECHOCARDIOGRAM REPORT   Patient Name:   Catherine Kane Date of Exam: 04/15/2022 Medical Rec #:  [id number]                  Height:       63.0 in Accession #:    [id number]                 Weight:       234.6 lb Date of Birth:  [birth year]                  BSA:          2.069 m Patient Age:    52 years                   BP:           118/67 mmHg Patient Gender: F                          HR:           67 bpm. Exam Location:  Inpatient Procedure:  2D Echo, Cardiac Doppler and Color Doppler Indications:    I63.9 Stroke  History:        Patient has prior history of Echocardiogram examinations, most                 recent 04/26/2017. Stroke; Risk Factors:Hypertension,                 Dyslipidemia and Non-Smoker.  Sonographer:  Wilkie Aye RVT RCS Referring Phys: [id number] Rosalin Hawking IMPRESSIONS  1. Left ventricular ejection fraction, by estimation, is 70 to 75%. The left ventricle has hyperdynamic function. Left ventricular endocardial border not optimally defined to evaluate regional wall motion. There is moderate left ventricular hypertrophy.  Left ventricular diastolic parameters are consistent with Grade I diastolic dysfunction (impaired relaxation).  2. Right ventricular systolic function is normal. The right ventricular size is normal.  3. The mitral valve is normal in structure. No evidence of mitral valve regurgitation. No evidence of mitral stenosis.  4. The aortic valve is tricuspid. There is mild calcification of the aortic valve. There is mild thickening of the aortic valve. Aortic valve regurgitation is not visualized. No aortic stenosis is present.  5. The inferior vena cava is normal in size with greater than 50% respiratory variability, suggesting right atrial pressure of 3 mmHg. FINDINGS  Left Ventricle: Left ventricular ejection fraction, by estimation, is 70 to 75%. The left ventricle has hyperdynamic function. Left ventricular endocardial border not optimally defined to evaluate regional wall motion. The left ventricular internal cavity size was normal in size. There is moderate left ventricular hypertrophy. Left ventricular diastolic parameters are consistent with Grade I diastolic dysfunction (impaired relaxation). Normal left ventricular filling pressure. Right Ventricle: The right ventricular size is normal. Right vetricular wall thickness was not well visualized. Right ventricular systolic function is normal. Left Atrium: Left atrial size  was normal in size. Right Atrium: Right atrial size was normal in size. Pericardium: There is no evidence of pericardial effusion. Mitral Valve: The mitral valve is normal in structure. There is mild thickening of the mitral valve leaflet(s). There is mild calcification of the mitral valve leaflet(s). Mild mitral annular calcification. No evidence of mitral valve regurgitation. No evidence of mitral valve stenosis. Tricuspid Valve: The tricuspid valve is normal in structure. Tricuspid valve regurgitation is not demonstrated. No evidence of tricuspid stenosis. Aortic Valve: The aortic valve is tricuspid. There is mild calcification of the aortic valve. There is mild thickening of the aortic valve. There is mild aortic valve annular calcification. Aortic valve regurgitation is not visualized. No aortic stenosis  is present. Aortic valve mean gradient measures 4.0 mmHg. Aortic valve peak gradient measures 8.1 mmHg. Aortic valve area, by VTI measures 3.22 cm. Pulmonic Valve: The pulmonic valve was not well visualized. Pulmonic valve regurgitation is not visualized. No evidence of pulmonic stenosis. Aorta: The aortic root is normal in size and structure. Venous: The inferior vena cava is normal in size with greater than 50% respiratory variability, suggesting right atrial pressure of 3 mmHg. IAS/Shunts: No atrial level shunt detected by color flow Doppler.  LEFT VENTRICLE PLAX 2D LVIDd:         4.30 cm   Diastology LVIDs:         2.50 cm   LV e' medial:    7.83 cm/s LV PW:         1.40 cm   LV E/e' medial:  11.5 LV IVS:        1.40 cm   LV e' lateral:   8.49 cm/s LVOT diam:     2.30 cm   LV E/e' lateral: 10.6 LV SV:         87 LV SV Index:   42 LVOT Area:     4.15 cm  RIGHT VENTRICLE             IVC RV Basal diam:  3.60 cm     IVC  diam: 1.20 cm RV Mid diam:    3.00 cm RV S prime:     17.30 cm/s TAPSE (M-mode): 2.3 cm LEFT ATRIUM             Index        RIGHT ATRIUM           Index LA diam:        3.30 cm 1.59 cm/m    RA Area:     10.50 cm LA Vol (A2C):   45.1 ml 21.80 ml/m  RA Volume:   19.30 ml  9.33 ml/m LA Vol (A4C):   34.1 ml 16.48 ml/m LA Biplane Vol: 40.8 ml 19.72 ml/m  AORTIC VALVE                    PULMONIC VALVE AV Area (Vmax):    3.19 cm     PV Vmax:       0.76 m/s AV Area (Vmean):   3.08 cm     PV Peak grad:  2.3 mmHg AV Area (VTI):     3.22 cm AV Vmax:           142.00 cm/s AV Vmean:          93.000 cm/s AV VTI:            0.271 m AV Peak Grad:      8.1 mmHg AV Mean Grad:      4.0 mmHg LVOT Vmax:         109.00 cm/s LVOT Vmean:        69.000 cm/s LVOT VTI:          0.210 m LVOT/AV VTI ratio: 0.77  AORTA Ao Root diam: 3.00 cm Ao Asc diam:  3.20 cm Ao Arch diam: 3.1 cm MITRAL VALVE MV Area (PHT): 3.53 cm     SHUNTS MV Decel Time: 215 msec     Systemic VTI:  0.21 m MV E velocity: 89.80 cm/s   Systemic Diam: 2.30 cm MV A velocity: 112.00 cm/s MV E/A ratio:  0.80 Carlyle Dolly MD Electronically signed by Carlyle Dolly MD Signature Date/Time: 04/15/2022/1:54:11 PM    Final    VAS [place] LOWER EXTREMITY VENOUS (DVT)  Result Date: 04/15/2022  Lower Venous DVT Study Patient Name:  Catherine Kane  Date of Exam:   04/15/2022 Medical Rec #: [id number]                   Accession #:    [id number] Date of Birth: [birth year]                   Patient Gender: F Patient Age:   69 years Exam Location:  [facility] Procedure:      VAS [place] LOWER EXTREMITY VENOUS (DVT) Referring Phys: PRANAV PATEL --------------------------------------------------------------------------------  Indications: Venous thromboembolism (VTE) [id number].  Risk Factors: None identified. Comparison Study: No prior studies. Performing Technologist: Oliver Hum RVT  Examination Guidelines: A complete evaluation includes B-mode imaging, spectral Doppler, color Doppler, and power Doppler as needed of all accessible portions of each vessel. Bilateral testing is considered an integral part of a complete examination. Limited examinations for  reoccurring indications may be performed as noted. The reflux portion of the exam is performed with the patient in reverse Trendelenburg.  +---------+---------------+---------+-----------+----------+--------------+ RIGHT    CompressibilityPhasicitySpontaneityPropertiesThrombus Aging +---------+---------------+---------+-----------+----------+--------------+ CFV      Full           Yes  Yes                                 +---------+---------------+---------+-----------+----------+--------------+ SFJ      Full                                                        +---------+---------------+---------+-----------+----------+--------------+ FV Prox  Full                                                        +---------+---------------+---------+-----------+----------+--------------+ FV Mid   Full                                                        +---------+---------------+---------+-----------+----------+--------------+ FV DistalFull                                                        +---------+---------------+---------+-----------+----------+--------------+ PFV      Full                                                        +---------+---------------+---------+-----------+----------+--------------+ POP      Full           Yes      Yes                                 +---------+---------------+---------+-----------+----------+--------------+ PTV      Full                                                        +---------+---------------+---------+-----------+----------+--------------+ PERO     Full                                                        +---------+---------------+---------+-----------+----------+--------------+   +---------+---------------+---------+-----------+----------+--------------+ LEFT     CompressibilityPhasicitySpontaneityPropertiesThrombus Aging  +---------+---------------+---------+-----------+----------+--------------+ CFV      Full           Yes      Yes                                 +---------+---------------+---------+-----------+----------+--------------+ SFJ      Full                                                        +---------+---------------+---------+-----------+----------+--------------+  FV Prox  Full                                                        +---------+---------------+---------+-----------+----------+--------------+ FV Mid   Full                                                        +---------+---------------+---------+-----------+----------+--------------+ FV DistalFull                                                        +---------+---------------+---------+-----------+----------+--------------+ PFV      Full                                                        +---------+---------------+---------+-----------+----------+--------------+ POP      Full           Yes      Yes                                 +---------+---------------+---------+-----------+----------+--------------+ PTV      Full                                                        +---------+---------------+---------+-----------+----------+--------------+ PERO     Full                                                        +---------+---------------+---------+-----------+----------+--------------+    Summary: RIGHT: - There is no evidence of deep vein thrombosis in the lower extremity.  - No cystic structure found in the popliteal fossa.  LEFT: - There is no evidence of deep vein thrombosis in the lower extremity.  - No cystic structure found in the popliteal fossa.  *See table(s) above for measurements and observations.    Preliminary     PHYSICAL EXAM General no acute distress.  Appears comfortable. Respiratory: Nonlabored  Neuro: Mental Status: Alert awake and oriented to person  place time.  Can follow commands.  No aphasia. Cranial Nerves: II: Visual Fields are full. Pupils are equal, round, and reactive to light.   III,IV, VI: EOMI without ptosis or diploplia.  V: Facial sensation is symmetric to temperature VII: Facial movement is symmetric resting and smiling VIII: Hearing is intact to voice   Motor:  Right upper extremity 4+/5-no drift. RLE 5/5 Left side upper and lower 5/5 Normal tone and bulk.  Sensory: Patient intact to light touch in all  4 extremities.  Cerebellar: Finger-to-nose intact.  No ataxia   ASSESSMENT/PLAN [name] is a 52 y.o. female with history of anxiety hypertension vitamin B and D deficiency presented to the emergency room for evaluation of ongoing headache for the past 3 days.   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 Diffusely hyperdense appearance of the dural venous sinuses raises the possibility for dural venous sinus thrombosis.  CTA/CTV- Occlusive thrombus in the right transverse and sigmoid sinuses, with near occlusive thrombus extending from the vertex superior sagittal sinus to the torcula. The anterior portion of the superior sagittal sinus is patent. The inferior sagittal sinus is somewhat diminutive but appears patent. MRI- Extensive dural venous sinus thrombosis involving the superior sagittal and right transverse/sigmoid sinuses. 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 Questionable signal abnormality in the medial left thalamus which may be related to seizure. Repeat CT Head- Extensive dural venous sinus thrombosis as demonstrated by CTV/MRI yesterday. Probable venous infarct left parietal lobe with trace subarachnoid blood versus cortical vein thrombus is stable EEG- This study is within  normal limits. No seizures or epileptiform discharges were seen throughout the recording.  2D Echo completed results pending LDL 93 HgbA1c pending UDS + for cocaine Hypercoagulable labs and ANA pending  Protein S, C; lupus, antithrombin, beta 2, neg. And CT negative for malignancy.  Possible uterine fibroids. VTE prophylaxis - Heparin IV aspirin 81 mg daily prior to admission, now on heparin IV. Transition to [place] tomorrow. On keppra for seizure prevention for 7 days Therapy recommendations:  potential CIR Disposition:  pending   Hypertension Home meds:  Norvasc '10mg'$ , Coreg 12.'5mg'$ , valsartan-hydrochlorothiazide Stable BP goal < 160 IV Cleviprex - currently off Norvasc resumed   Hyperlipidemia Home meds:  Atorvastatin '10mg'$ , resumed in hospital LDL 93, goal < 70 Atorvastatin  '40mg'$  Continue statin at discharge  Tobacco abuse Current smoker Smoking cessation counseling provided Pt is willing to quit  Cocaine abuse UDS + cocaine Cessation education provided Pt is willing to quit  Other Stroke Risk Factors Obesity, Body mass index is 41.55 kg/m., BMI >/= 30 associated with increased stroke risk, recommend weight loss, diet and exercise as appropriate   Other Active Problems Headache: improving. Prn tramadol/fioricet. Hypokalemia K 2.7 -> 3.4- replaced   Hospital day # 3  Egbert Garibaldi, MD     To contact Stroke Continuity provider, please refer to [email or web address] After hours, contact General Neurology

## 2022-04-16 NOTE — Progress Notes (Signed)
ANTICOAGULATION CONSULT NOTE  Pharmacy Consult for heparin Indication:  dural venous sinus thrombosis  Allergies  Allergen Reactions   Grass Pollen([id number]-T-Swt Vern)    Patient Measurements: Weight: 106.4 kg (234 lb 9.1 oz) Actual body weight: 86.2 kg IBW: 52.4 kg Heparin Dosing Weight:   71.7 kg  Vital Signs: Temp: 98.3 F (36.8 C) (03/10 0800) Temp Source: Oral (03/10 0800) BP: 136/79 (03/10 0835) Pulse Rate: 64 (03/10 0400)  Labs: Recent Labs    04/13/22 2035 04/13/22 2041 04/14/22 0442 04/14/22 1253 04/15/22 0513 04/15/22 0807 04/15/22 1338 04/16/22 0721  HGB 14.9   < > 16.6*  --  13.3  --   --  13.7  HCT 43.7   < > 48.4*  --  40.5  --   --  42.1  PLT 326  --  313  --  298  --   --  262  APTT 30  --   --   --   --   --   --   --   LABPROT 13.9  --   --   --   --   --   --   --   INR 1.1  --   --   --   --   --   --   --   HEPARINUNFRC  --   --  <0.10*   < >  --  0.36 0.32 0.46  CREATININE 0.78   < > 0.76  --  0.80  --   --  0.82  CKTOTAL  --   --   --   --  160  --   --   --   CKMB  --   --   --   --  1.4  --   --   --   TROPONINIHS  --   --   --   --  7  --   --   --    < > = values in this interval not displayed.    Estimated Creatinine Clearance: 96.9 mL/min (by C-G formula based on SCr of 0.82 mg/dL).   Assessment: 52 yo female with dural venous sinus thrombosis and subarachnoid hemorrhage. Doppler negative.  Pharmacy consulted for IV heparin.   Heparin level therapeutic; no bleeding reported, CBC stable.  Goal of Therapy:  Heparin level 0.3-0.5  units/mL Monitor platelets by anticoagulation protocol: Yes   Plan:  Continue heparin infusion at 1350 units/hr Daily heparin level and CBC F/U with transitioning to oral [facility]  Jelicia Nantz D. Mina Marble, PharmD, BCPS, [place] 04/16/2022, 9:57 AM

## 2022-04-16 NOTE — PMR Pre-admission (Shared)
PMR Admission Coordinator Pre-Admission Assessment  Patient: Catherine Kane is an 52 y.o., female MRN: [id number] DOB: Jun 24, 1970 Height:   Weight: 106.4 kg  Insurance Information HMO: ***    PPO: ***     PCP:      IPA:      80/20:      OTHER:  PRIMARY: [place] [employer]      Policy#: [id number]      Subscriber: patient CM Name: ***      Phone#: ***     Fax#: *** Pre-Cert#: ***      Employer: *** Benefits:  Phone #: ***     Name: *** Eff. Date: ***     Deduct: ***      Out of Pocket Max: ***      Life Max: *** CIR: ***      SNF: *** Outpatient: ***     Co-Pay: *** Home Health: ***      Co-Pay: *** DME: ***     Co-Pay: *** Providers: in-network SECONDARY:       Policy#:      Phone#:   Financial Counselor:       Phone#:   The Engineer, petroleum" for patients in Inpatient Rehabilitation Facilities with attached "Privacy Act [place] Records" was provided and verbally reviewed with: N/A  Emergency Contact Information Contact Information     Name Relation Home Work Mobile   Lucia Gaskins Friend   [phone]       Current Medical History  Patient Admitting Diagnosis: CVA History of Present Illness: Pt is a 52 year old female with medical hx significant for: anxiety, HTN, Vitamin B and D deficiency, migraines, asthma. Pt presented to [facility] on 04/13/22 d/t ongoing headache for 3 days. Pt has been to ER twice over past couple of days. CT head on 04/12/22 was unremarkable and pt sent home. Neurology consulted. Code stroke activated. CT head on 3/7 showed SAH in left lateral lobe without evidence of IVH or hydrocephalus. CTA  head and neck showed occlusive thrombus in right transverse and sigmoid sinuses with near occlusive thrombus extending from vertex superior sagittal sinus to trochlea.  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 Questionable signal abnormality in medial left thalamus which may be related to seizure. EEG on 3/8 showed no seizures. CT head on 3/8 showed no major change in [facility]. Therapy evaluations completed and CIR recommended d/t pt's deficits in functional mobility and inability to complete ADLs independently.  Complete NIHSS TOTAL: 4  Patient's medical record from [facility] has been reviewed by the rehabilitation admission coordinator and physician.  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 No  Family History   family history includes Breast cancer in her maternal aunt and maternal grandmother; Heart disease in her mother and sister.  Current Medications  Current Facility-Administered Medications:    acetaminophen (TYLENOL) tablet 650 mg, 650 mg, Oral, Q6H PRN, Paliwal, Aditya, MD, 650 mg at  04/14/22 2151   amLODipine (NORVASC) tablet 10 mg, 10 mg, Oral, Daily, Charlean Merl, Devon, NP, 10 mg at 04/16/22 [id number]   atorvastatin (LIPITOR) tablet 40 mg, 40 mg, Oral, Daily, Rosalin Hawking, MD, 40 mg at 04/16/22 [id number]   butalbital-acetaminophen-caffeine (FIORICET) 50-325-40 MG per tablet 1 tablet, 1 tablet, Oral, Q4H PRN, Levie Heritage, MD, 1 tablet at 04/16/22 1556   Chlorhexidine Gluconate Cloth 2 % PADS 6 each, 6 each, Topical, q morning, Collier Bullock, MD, 6 each at 04/16/22 [id number]   cyanocobalamin (VITAMIN B12) injection 1,000 mcg, 1,000 mcg, Subcutaneous, Q1200, 1,000 mcg at 04/16/22 1250 **FOLLOWED BY** [START ON 04/19/2022] cyanocobalamin (VITAMIN B12) tablet 1,000 mcg, 1,000 mcg, Oral, Daily,  Posey Pronto, Josetta Huddle, MD   docusate sodium (COLACE) capsule 100 mg, 100 mg, Oral, BID PRN, Collier Bullock, MD   ferrous sulfate tablet 325 mg, 325 mg, Oral, Lavetta Nielsen, MD, 325 mg at 04/15/22 [id number]   FLUoxetine (PROZAC) capsule 40 mg, 40 mg, Oral, Daily, Brand Males, MD, 40 mg at 04/16/22 0923   heparin ADULT infusion 100 units/mL (25000 units/226m), 1,350 Units/hr, Intravenous, Continuous, Dang, Thuy D, RPH, Last Rate: 13.5 mL/hr at 04/16/22 1608, 1,350 Units/hr at 04/16/22 1608   hydrALAZINE (APRESOLINE) injection 10 mg, 10 mg, Intravenous, Q4H PRN, Paliwal, Aditya, MD, 10 mg at 04/14/22 0225   labetalol (NORMODYNE) injection 10 mg, 10 mg, Intravenous, Q2H PRN, Paliwal, Aditya, MD, 10 mg at 04/14/22 0210   levETIRAcetam (KEPPRA) tablet 500 mg, 500 mg, Oral, BID, MWilson SingerI, RPH, 500 mg at 04/16/22 0923   montelukast (SINGULAIR) tablet 10 mg, 10 mg, Oral, QHS, Ramaswamy, Murali, MD, 10 mg at 04/15/22 2220   ondansetron (ZOFRAN) injection 4 mg, 4 mg, Intravenous, Q6H PRN, Paliwal, Aditya, MD, 4 mg at 04/16/22 0653   pantoprazole (PROTONIX) EC tablet 40 mg, 40 mg, Oral, Daily, XRosalin Hawking MD, 40 mg at 04/16/22 [id number]  polyethylene glycol (MIRALAX / GLYCOLAX) packet 17 g, 17 g, Oral, Daily PRN, GCollier Bullock MD   prochlorperazine (COMPAZINE) injection 10 mg, 10 mg, Intravenous, Q6H PRN, PLavina Hamman MD   sodium chloride flush (NS) 0.9 % injection 10-40 mL, 10-40 mL, Intracatheter, Q12H, Ramaswamy, Murali, MD, 10 mL at 04/16/22 1000   sodium chloride flush (NS) 0.9 % injection 10-40 mL, 10-40 mL, Intracatheter, PRN, RBrand Males MD   traMADol (ULTRAM) tablet 50 mg, 50 mg, Oral, Q6H PRN, PLavina Hamman MD, 50 mg at 04/16/22 [id number]  traZODone (DESYREL) tablet 100 mg, 100 mg, Oral, QHS PRN, RBrand Males MD   Vitamin D (Ergocalciferol) (DRISDOL) 1.25 MG (50000 UNIT) capsule 50,000 Units, 50,000 Units, Oral, Q7 days, RBrand Males MD, 50,000 Units at  04/16/22 [id number] Patients Current Diet:  Diet Order             Diet 2 gram sodium Room service appropriate? Yes; Fluid consistency: Thin  Diet effective now                   Precautions / Restrictions Precautions Precautions: Fall, Other (comment) Precaution Comments: SBP 130-150, photophobia Restrictions Weight Bearing Restrictions: No   Has the patient had 2 or more falls or a fall with injury in the past year? No  Prior Activity Level Limited Community (1-2x/wk): gets out of house ~2 days/week  Prior Functional Level Self Care: Did the patient need help bathing, dressing, using the toilet or eating? Independent  Indoor Mobility: Did the patient need assistance with walking from room to room (with  or without device)? Independent  Stairs: Did the patient need assistance with internal or external stairs (with or without device)? Independent  Functional Cognition: Did the patient need help planning regular tasks such as shopping or remembering to take medications? Independent  Patient Information Are you of Hispanic, Latino/a,or Spanish origin?: A. No, not of Hispanic, Latino/a, or Spanish origin What is your race?: B. Black or African American Do you need or want an interpreter to communicate with a doctor or health care staff?: 0. No  Patient's Response To:  Health Literacy and Transportation Is the patient able to respond to health literacy and transportation needs?: Yes Health Literacy - How often do you need to have someone help you when you read instructions, pamphlets, or other written material from your doctor or pharmacy?: Never In the past 12 months, has lack of transportation kept you from medical appointments or from getting medications?: No In the past 12 months, has lack of transportation kept you from meetings, work, or from getting things needed for daily living?: No  Home Assistive Devices / Equipment Home Equipment: None  Prior Device Use: Indicate  devices/aids used by the patient prior to current illness, exacerbation or injury? None of the above  Current Functional Level Cognition  Overall Cognitive Status: Within Functional Limits for tasks assessed Orientation Level: Oriented X4    Extremity Assessment (includes Sensation/Coordination)  Upper Extremity Assessment: RUE deficits/detail RUE Deficits / Details: WFL ROM, decrease sensation, decreased coordination with hand to face  Lower Extremity Assessment: RLE deficits/detail RLE Deficits / Details: Grossly ~2+/5 hip flexion, 3/5 knee extension/flexion, ankle DF/PF 3/5. RLE Sensation: decreased light touch    ADLs  Overall ADL's : Needs assistance/impaired Eating/Feeding: Set up, Sitting Grooming: Wash/dry hands, Minimal assistance, Standing Upper Body Bathing: Minimal assistance, Sitting Lower Body Bathing: Moderate assistance, Sit to/from stand Toilet Transfer: Minimal assistance, Ambulation, Regular Toilet, Rolling walker (2 wheels) Toileting- Clothing Manipulation and Hygiene: Min guard, Sitting/lateral lean Toileting - Clothing Manipulation Details (indicate cue type and reason): pt states i couldnt tell if i was still holding the paper or not Functional mobility during ADLs: Minimal assistance, Rolling walker (2 wheels)    Mobility  Overal bed mobility: Needs Assistance Bed Mobility: Rolling, Sidelying to Sit, Sit to Supine Rolling: Min guard Sidelying to sit: HOB elevated, Min assist Sit to supine: Min guard, HOB elevated General bed mobility comments: Able to get to EOB with increased time, use of rail and Min A for trunk, "wooziness" reported. Cues for gaze stabilization. Able to return to supine without assist.    Transfers  Overall transfer level: Needs assistance Equipment used: None, Rolling walker (2 wheels) Transfers: Sit to/from Stand Sit to Stand: Min assist, +2 safety/equipment General transfer comment: Assist to power to standing without use of RW  initially from EOB x1, right knee buckling/unstable. Grabbed RW for UE support.    Ambulation / Gait / Stairs / Wheelchair Mobility  Ambulation/Gait Ambulation/Gait assistance: Min assist, Counsellor (Feet): 20 Feet (x2 bouts) Assistive device: Rolling walker (2 wheels) Gait Pattern/deviations: Step-through pattern, Decreased stride length General Gait Details: Slow, mildly unsteady gait with right knee instability and assist for RW management esp right hand placement/awareness at times. Gait velocity: decreased Gait velocity interpretation: <1.31 ft/sec, indicative of household ambulator    Posture / Balance Dynamic Sitting Balance Sitting balance - Comments: supervision for safety, no dynamic activities performed due to wooziness. Balance Overall balance assessment: Needs assistance Sitting-balance support: Feet supported, No upper extremity  supported Sitting balance-Leahy Scale: Fair Sitting balance - Comments: supervision for safety, no dynamic activities performed due to wooziness. Standing balance support: During functional activity, Single extremity supported Standing balance-Leahy Scale: Poor Standing balance comment: Requires at least 1 UE support, able to wash hands at sink with close Min gaurd-Min A    Special needs/care consideration {Special Care Needs/Care Considerations:304600603}   Previous Home Environment (from acute therapy documentation) Living Arrangements: Spouse/significant other  Lives With: Significant other Available Help at Discharge: Family, Available 24 hours/day Type of Home: Other(Comment) (lives in a motel) Home Layout: One level Home Access: Level entry Bathroom Shower/Tub: Chiropodist: Standard Bathroom Accessibility: Yes How Accessible: Accessible via walker [place]: No Additional Comments: pt does report some food insecurities  Discharge Living Setting Plans for Discharge Living Setting: Other  (Comment) (motel) Type of Home at Discharge: Other (Comment) (motel) Discharge Home Layout: One level Discharge Home Access: Level entry Discharge Bathroom Shower/Tub: Tub/shower unit Discharge Bathroom Toilet: Standard Discharge Bathroom Accessibility: Yes How Accessible: Accessible via walker Does the patient have any problems obtaining your medications?: No  Social/Family/Support Systems Anticipated Caregiver: AJ (fiance) and Deja (daughter) Anticipated Caregiver's Contact Information: [phone] Caregiver Availability: 24/7 Discharge Plan Discussed with Primary Caregiver: Yes Is Caregiver In Agreement with Plan?: Yes Does Caregiver/Family have Issues with Lodging/Transportation while Pt is in Rehab?: No  Goals Patient/Family Goal for Rehab: *** Expected length of stay: *** Pt/Family Agrees to Admission and willing to participate: Yes Program Orientation Provided & Reviewed with Pt/Caregiver Including Roles  & Responsibilities: Yes  Decrease burden of Care through IP rehab admission: NA  Possible need for SNF placement upon discharge: Not anticipated  Patient Condition: I have reviewed medical records from [facility], spoken with CM, and patient and spouse. I met with patient at the bedside for inpatient rehabilitation assessment.  Patient will benefit from ongoing PT and OT, can actively participate in 3 hours of therapy a day 5 days of the week, and can make measurable gains during the admission.  Patient will also benefit from the coordinated team approach during an Inpatient Acute Rehabilitation admission.  The patient will receive intensive therapy as well as Rehabilitation physician, nursing, social worker, and care management interventions.  Due to safety, disease management, medication administration, pain management, and patient education the patient requires 24 hour a day rehabilitation nursing.  The patient is currently *** with mobility and basic ADLs.  Discharge  setting and therapy post discharge at home with home health is anticipated.  Patient has agreed to participate in the Acute Inpatient Rehabilitation Program and will admit {Time; today/tomorrow:10263}.  Preadmission Screen Completed By:  Bethel Born, 04/16/2022 4:14 PM ______________________________________________________________________   Discussed status with Dr. Marland Kitchen on *** at *** and received approval for admission today.  Admission Coordinator:  Bethel Born, CCC-SLP, time ***/Date ***   Assessment/Plan: Diagnosis: Does the need for close, 24 hr/day Medical supervision in concert with the patient's rehab needs make it unreasonable for this patient to be served in a less intensive setting? {yes_no_potentially:3041433} Co-Morbidities requiring supervision/potential complications: *** Due to {due WC:4653188, does the patient require 24 hr/day rehab nursing? {yes_no_potentially:3041433} Does the patient require coordinated care of a physician, rehab nurse, PT, OT, and SLP to address physical and functional deficits in the context of the above medical diagnosis(es)? {yes_no_potentially:3041433} Addressing deficits in the following areas: {deficits:3041436} Can the patient actively participate in an intensive therapy program of at least 3 hrs of therapy  5 days a week? {yes_no_potentially:3041433} The potential for patient to make measurable gains while on inpatient rehab is {potential:3041437} Anticipated functional outcomes upon discharge from inpatient rehab: {functional outcomes:304600100} PT, {functional outcomes:304600100} OT, {functional outcomes:304600100} SLP Estimated rehab length of stay to reach the above functional goals is: *** Anticipated discharge destination: {anticipated dc setting:21604} 10. Overall Rehab/Functional Prognosis: {potential:3041437}   MD Signature: ***

## 2022-04-16 NOTE — Progress Notes (Signed)
Physical Therapy Treatment Patient Details Name: Catherine Kane MRN: [id number] DOB: [birth year] Today's Date: 04/16/2022   History of Present Illness 52 y/o female who presents on 3/7 with headache, speech difficulty, right sided numbness. 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 Questionable signal abnormality in the medial left thalamus which may be related to seizure. + cocaine. PMH includes migraines, essential hypertension, anxiety and a history of COVID.    PT Comments    Pt making excellent progress with improved gait distance and improved R sided strength but did still have some dizziness and required min A at times for balance.   VSS were stable with transfers.  Did educate on habituation exercises (head turns in sitting/supine) to improve dizziness.  Also, encouraged focus points and segmental turns with mobility for safety - good improvement with segmental turns.  Pt tolerating exercises well aimed at strengthening R LE.  Pt is home alone at times and pt and significant other do not feel safe with her being alone yet - continue to recommend AIR,.  Recommendations for follow up therapy are one component of a multi-disciplinary discharge planning process, led by the attending physician.  Recommendations may be updated based on patient status, additional functional criteria and insurance authorization.  Follow Up Recommendations  Acute inpatient rehab (3hours/day)     Assistance Recommended at Discharge Frequent or constant Supervision/Assistance  Patient can return home with the following A little help with walking and/or transfers;A little help with bathing/dressing/bathroom;Help with stairs or ramp for entrance;Assistance with cooking/housework;Assist for transportation   Equipment Recommendations  Rolling walker (2 wheels)     Recommendations for Other Services Rehab consult     Precautions / Restrictions Precautions Precautions: Fall Precaution Comments: Marland Kitchen     Mobility  Bed Mobility Overal bed mobility: Needs Assistance Bed Mobility: Supine to Sit     Supine to sit: Supervision          Transfers Overall transfer level: Needs assistance Equipment used: Rolling walker (2 wheels) Transfers: Sit to/from Stand Sit to Stand: Min guard           General transfer comment: Min guard for safety; performed x 7 during session    Ambulation/Gait Ambulation/Gait assistance: Min guard, Min assist Gait Distance (Feet): 120 Feet Assistive device: Rolling walker (2 wheels) Gait Pattern/deviations: Step-through pattern, Decreased stride length Gait velocity: decreased     General Gait Details: Overall min guard but did have some instability with a turn requiring min A for balance.  Pt reported feeling woozy.  VSS.  Educated on focus points with transfers and segmental turns with reports of improvement in wooziness.  Reports R knee feels unstable but no buckling noted   Stairs             Wheelchair Mobility    Modified Rankin (Stroke Patients Only) Modified Rankin (Stroke Patients Only) Pre-Morbid Rankin Score: No symptoms Modified Rankin: Moderately severe disability     Balance Overall balance assessment: Needs assistance Sitting-balance support: Feet supported, No upper extremity supported Sitting balance-Leahy Scale: Good     Standing balance support: During functional activity, Bilateral upper extremity supported, No upper extremity supported Standing balance-Leahy Scale: Fair Standing balance comment: RW to ambulate but able to static stand without support  Cognition Arousal/Alertness: Awake/alert Behavior During Therapy: WFL for tasks assessed/performed Overall Cognitive Status: Within Functional Limits for tasks assessed                                           Exercises Other Exercises Other Exercises: Sit to stand x 5 with L leg forward to strengthen R LE requiring cues, increased time, min guard Other Exercises: Standing marching with RW x 10, heel raises x 10 with RW both with min guard for safety Other Exercises: Habituation exercises: no dizziness with EOEM or saccades only with head turns eyes open.  Had pt perform 10 head turns and encouraged to practice on her own in sitting or supine.    General Comments General comments (skin integrity, edema, etc.): BP 117-124/70's during treatment.   MMT: R LE 4/5 throughout L LE 5/5 throughout; coordination with heel shin slower on R than L but functional      Pertinent Vitals/Pain Pain Assessment Pain Assessment: No/denies pain    Home Living   Living Arrangements: Spouse/significant other Available Help at Discharge: Family;Available 24 hours/day Type of Home: Other(Comment) (lives in a motel) Home Access: Level entry       Home Layout: One level        Prior Function            PT Goals (current goals can now be found in the care plan section) Progress towards PT goals: Progressing toward goals    Frequency    Min 4X/week      PT Plan Current plan remains appropriate    Co-evaluation              AM-PAC PT "6 Clicks" Mobility   Outcome Measure  Help needed turning from your back to your side while in a flat bed without using bedrails?: A Little Help needed moving from lying on your back to sitting on the side of a flat bed without using bedrails?: A Little Help needed moving to and from a bed to a chair (including a wheelchair)?: A Little Help needed standing up from a chair using your arms (e.g., wheelchair or bedside chair)?: A Little Help needed to walk in hospital room?: A Little Help needed climbing 3-5 steps with a railing? : A Lot 6 Click Score: 17    End of Session Equipment Utilized During Treatment:  Gait belt Activity Tolerance: Patient tolerated treatment well Patient left: in bed;with call bell/phone within reach;with family/visitor present Nurse Communication: Mobility status PT Visit Diagnosis: Pain;Hemiplegia and hemiparesis;Muscle weakness (generalized) (M62.81);Unsteadiness on feet (R26.81);Difficulty in walking, not elsewhere classified (R26.2) Hemiplegia - Right/Left: Right Hemiplegia - dominant/non-dominant: Dominant Hemiplegia - caused by: Cerebral infarction     Time: [id number] PT Time Calculation (min) (ACUTE ONLY): 28 min  Charges:  $Gait Training: 8-22 mins $Therapeutic Exercise: 8-22 mins                     Abran Richard, PT Acute Rehab [facility] [phone]    Karlton Lemon 04/16/2022, 5:46 PM

## 2022-04-16 NOTE — Progress Notes (Signed)
[facility] Progress Note Patient: Catherine Kane [id number] DOB: [birth year] DOA: 04/13/2022  DOS: the patient was seen and examined on 04/16/2022  Brief hospital course: PMH of HTN, migraine, anxiety, asthma, basal deficiency, vitamin D deficiency, substance abuse.  To the hospital with complaints of right-sided weakness and headache. Originally presented on 04/11/2022 to ED with headache ongoing for 3 days with vomiting.  CT of the head was unremarkable.  Treated symptomatically with improvement in symptoms.  Was discharged on oral Zofran. Return on 3/07-2022 with complaints of right side of the face ear and neck hurting with right ear muffled feeling.  Had right TM erythema with bulging concerning for possible infection treated with Augmentin.  COVID flu and RSV was negative.  Omnicef was sent by PCP outpatient but patient left AMA. Return to ER on 3/7 due to persistent right-sided numbness.  Now admitted with a diagnosis of [place] as well as dural venous sinus thrombosis.  Initially in the ICU.  Neurology consulted.  Started on IV heparin.  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 Presents with recurrent headache likely going since the beginning of the March. Found to have occlusive thrombus in the right transverse and sigmoid sinuses as well as near occlusive thrombus extending from vertex superior sagittal sinus to torcula. CT angio negative for any large vessel occlusion. MRI brain shows venous infarct. Restricted diffusion in the medial left thalamus-seizure? 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 Neurology was consulted. Patient was started on IV heparin as the risk for progressive decline without anticoagulation outweighed the risk for worsening SAH per neurology.  Likely on DOACs tomorrow. Currently no focal deficit. Still has photophobia and headache. Improving.  Continue Fioricet and tramadol as needed. Currently  on Keppra for seizure prophylaxis for 7 days.  Last day 3/15.  Hypercoagulable state. Patient presents with dural venous sinus thrombosis. Workup initiated for hypercoagulable state. So far workup is unremarkable and etiology is unclear. CT chest abdomen pelvis negative for any malignant site or any other source of thromboembolism. Reported to have some right ear infection although suspect that his swelling and erythema in the setting of venous sinus thrombosis. Patient is positive for cocaine but no other substance abuse reported. No other fever chills or any other infectious symptoms reported. No other hardware reported by the patient. No prior history of any inflammatory condition. COVID is negative. Continue further workup.  Hypertensive emergency. Blood pressure was elevated. Currently on Norvasc,  Reportedly on carvedilol Diovan HCTZ both currently on hold. Monitor.  GERD. Continue PPI.  Hypokalemia. Currently being treated.  Mood disorder. On Prozac. Continue. On chlorphentermine.  Will confirm home regimen and initiate.  HLD. On Lipitor 40 mg daily. Continue.  Substance abuse. Patient is cocaine positive. Counseled the patient to avoid any substance abuse in future.  Class 3 obesity Body mass index is 41.55 kg/m.  Placing the patient at high risk of poor outcome.  Severe B12 deficiency. Will initiate with injections while in the hospital.  Transition to oral tablets on discharge.  Subjective: Improving symptoms.  No nausea no vomiting no fever no chills.  No abdominal pain.  Able to tolerate oral diet.  Photophobia is resolved as well.  Still has headache but 4 out of 10.  Physical Exam: Clear to auscultation. S1-S2 present. No new focal deficit.  Data Reviewed: I have Reviewed nursing notes, Vitals, and Lab results. Reviewed CBC BMP and B12 level. Ordered BMP.  Disposition: Status  is: Inpatient Remains inpatient appropriate because: Currently on  IV heparin.  SCDs Start: 04/13/22 2229   Family Communication: Significant other at bedside Level of care: Telemetry Medical due to hemorrhagic stroke. Vitals:   04/16/22 1400 04/16/22 1600 04/16/22 1602 04/16/22 1715  BP:   117/85 124/84  Pulse:      Resp: 13  (!) 28 16  Temp:  97.6 F (36.4 C)    TempSrc:  Oral    SpO2:      Weight:         Author: Berle Mull, MD 04/16/2022 6:54 PM  Please look on [email or web address] to find out who is on call.

## 2022-04-17 ENCOUNTER — Other Ambulatory Visit (HOSPITAL_COMMUNITY): Payer: Self-pay

## 2022-04-17 ENCOUNTER — Other Ambulatory Visit: Payer: Self-pay

## 2022-04-17 DIAGNOSIS — I639 Cerebral infarction, unspecified: Secondary | ICD-10-CM | POA: Diagnosis not present

## 2022-04-17 DIAGNOSIS — G08 Intracranial and intraspinal phlebitis and thrombophlebitis: Secondary | ICD-10-CM | POA: Diagnosis not present

## 2022-04-17 LAB — CBC WITH DIFFERENTIAL/PLATELET
Abs Immature Granulocytes: 0.01 10*3/uL (ref 0.00–0.07)
Basophils Absolute: 0.1 10*3/uL (ref 0.0–0.1)
Basophils Relative: 2 %
Eosinophils Absolute: 0.1 10*3/uL (ref 0.0–0.5)
Eosinophils Relative: 2 %
HCT: 44.1 % (ref 36.0–46.0)
Hemoglobin: 14.2 g/dL (ref 12.0–15.0)
Immature Granulocytes: 0 %
Lymphocytes Relative: 46 %
Lymphs Abs: 2.6 10*3/uL (ref 0.7–4.0)
MCH: 28 pg (ref 26.0–34.0)
MCHC: 32.2 g/dL (ref 30.0–36.0)
MCV: 87 fL (ref 80.0–100.0)
Monocytes Absolute: 0.5 10*3/uL (ref 0.1–1.0)
Monocytes Relative: 9 %
Neutro Abs: 2.3 10*3/uL (ref 1.7–7.7)
Neutrophils Relative %: 41 %
Platelets: 314 10*3/uL (ref 150–400)
RBC: 5.07 MIL/uL (ref 3.87–5.11)
RDW: 12.7 % (ref 11.5–15.5)
WBC: 5.7 10*3/uL (ref 4.0–10.5)
nRBC: 0 % (ref 0.0–0.2)

## 2022-04-17 LAB — BASIC METABOLIC PANEL
Anion gap: 9 (ref 5–15)
BUN: 10 mg/dL (ref 6–20)
CO2: 25 mmol/L (ref 22–32)
Calcium: 9 mg/dL (ref 8.9–10.3)
Chloride: 101 mmol/L (ref 98–111)
Creatinine, Ser: 0.86 mg/dL (ref 0.44–1.00)
GFR, Estimated: >60 mL/min (ref >60)
Glucose, Bld: 89 mg/dL (ref 70–99)
Potassium: 3.6 mmol/L (ref 3.5–5.1)
Sodium: 135 mmol/L (ref 135–145)

## 2022-04-17 LAB — PHOSPHORUS: Phosphorus: 2.8 mg/dL (ref 2.5–4.6)

## 2022-04-17 LAB — MAGNESIUM: Magnesium: 1.9 mg/dL (ref 1.7–2.4)

## 2022-04-17 LAB — HEPARIN LEVEL (UNFRACTIONATED): Heparin Unfractionated: 0.53 IU/mL (ref 0.30–0.70)

## 2022-04-17 LAB — HEMOGLOBIN A1C
Hgb A1c MFr Bld: 5.4 % (ref 4.8–5.6)
Mean Plasma Glucose: 108 mg/dL

## 2022-04-17 LAB — ANA W/REFLEX IF POSITIVE: Anti Nuclear Antibody (ANA): NEGATIVE

## 2022-04-17 MED ORDER — DOCUSATE SODIUM 100 MG PO CAPS
100.0000 mg | ORAL_CAPSULE | Freq: Every day | ORAL | Status: DC
  Administered 2022-04-17 – 2022-04-19 (×3): 100 mg via ORAL
  Filled 2022-04-17 (×3): qty 1

## 2022-04-17 MED ORDER — ORAL CARE MOUTH RINSE: 15.0000 mL | OROMUCOSAL | Status: DC | PRN

## 2022-04-17 MED ORDER — LIP MEDEX EX OINT
TOPICAL_OINTMENT | CUTANEOUS | Status: DC | PRN
  Administered 2022-04-17: 75 via TOPICAL
  Filled 2022-04-17: qty 7

## 2022-04-17 MED ORDER — APIXABAN 5 MG PO TABS
5.0000 mg | ORAL_TABLET | Freq: Two times a day (BID) | ORAL | Status: DC
  Administered 2022-04-17 – 2022-04-19 (×5): 5 mg via ORAL
  Filled 2022-04-17 (×5): qty 1

## 2022-04-17 NOTE — Progress Notes (Signed)
Physical Therapy Treatment Patient Details Name: Catherine Kane MRN: [id number] DOB: [birth year] Today's Date: 04/17/2022   History of Present Illness 52 y/o female who presents on 3/7 with headache, speech difficulty, right sided numbness. 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 Questionable signal abnormality in the medial left thalamus which may be related to seizure. + cocaine. PMH includes migraines, essential hypertension, anxiety and a history of COVID.    PT Comments    Pt progressing well towards all goals. Began gait training without AD this date. Pt with noted R LE weakness and knee instability but eager and motivated to return to independence. Pt denies dizziness this date and reports feeling much better than yesterday. Continue to recommend AIR upon d/c as pt was indep PTA and now presents with impaired balance, R sided weakness, decreased activity tolerance, and is at increased fall risk. Pt demonstrates excellent rehab potential. Acute PT to cont to follow.   Recommendations for follow up therapy are one component of a multi-disciplinary discharge planning process, led by the attending physician.  Recommendations may be updated based on patient status, additional functional criteria and insurance authorization.  Follow Up Recommendations  Acute inpatient rehab (3hours/day)     Assistance Recommended at Discharge Frequent or constant Supervision/Assistance  Patient can return home with the following A little help with walking and/or transfers;A little help with bathing/dressing/bathroom;Help with stairs or ramp for entrance;Assistance with cooking/housework;Assist for transportation   Equipment Recommendations  Rolling walker (2 wheels)    Recommendations for Other Services Rehab consult     Precautions / Restrictions  Precautions Precautions: Fall Precaution Comments: . Restrictions Weight Bearing Restrictions: No     Mobility  Bed Mobility Overal bed mobility: Needs Assistance Bed Mobility: Supine to Sit Rolling: Supervision Sidelying to sit: Min guard       General bed mobility comments: pt received in L sidelying, brought self up to sitting EOB, verbal cues ot focus on something not moving    Transfers Overall transfer level: Needs assistance Equipment used: Rolling walker (2 wheels) Transfers: Sit to/from Stand Sit to Stand: Min guard           General transfer comment: Min guard for safety, verbal cues for safe hand placement    Ambulation/Gait Ambulation/Gait assistance: Min guard, Min assist Gait Distance (Feet): 120 Feet (x3) Assistive device: Rolling walker (2 wheels), None Gait Pattern/deviations: Step-through pattern, Decreased stride length Gait velocity: decreased Gait velocity interpretation: <1.31 ft/sec, indicative of household ambulator   General Gait Details: pt initially ambulated with RW, denies dizziness and reports feeling "so much better than yesterday". Pt then ambulated 120' without ADx2, pt more guarded and cautious, noted occasionaly R knee instability but now overt LOB. pt hold bialt UEs at 45 deg abduction to help balance self.   Stairs             Wheelchair Mobility    Modified Rankin (Stroke Patients Only) Modified Rankin (Stroke Patients Only) Pre-Morbid Rankin Score: No symptoms Modified Rankin: Moderately severe disability     Balance Overall balance assessment: Needs assistance Sitting-balance support: Feet supported, No upper extremity supported Sitting balance-Leahy Scale: Good Sitting balance - Comments: supervision for safety, no dynamic activities performed due to wooziness.   Standing balance support: During functional activity, Bilateral upper extremity supported, No upper extremity supported Standing balance-Leahy Scale:  Fair Standing balance comment: RW to ambulate but  able to static stand without support                            Cognition Arousal/Alertness: Awake/alert Behavior During Therapy: WFL for tasks assessed/performed Overall Cognitive Status: Within Functional Limits for tasks assessed                                          Exercises Other Exercises Other Exercises: worked on lateral stepping progressing from 2 hands on railing, to one, to none both to the L and R x 10 feet x 3 trials Other Exercises: ambulated backwards with one hand on railing for support    General Comments General comments (skin integrity, edema, etc.): VSS, denies dizziness      Pertinent Vitals/Pain Pain Assessment Pain Assessment: 0-10 Pain Score: 6  Pain Location: head Pain Descriptors / Indicators: Headache Pain Intervention(s): Premedicated before session    Home Living                          Prior Function            PT Goals (current goals can now be found in the care plan section) Acute Rehab PT Goals Patient Stated Goal: to get better, return to PLOF PT Goal Formulation: With patient/family Time For Goal Achievement: 04/29/22 Potential to Achieve Goals: Good Progress towards PT goals: Progressing toward goals    Frequency    Min 4X/week      PT Plan Current plan remains appropriate    Co-evaluation              AM-PAC PT "6 Clicks" Mobility   Outcome Measure  Help needed turning from your back to your side while in a flat bed without using bedrails?: A Little Help needed moving from lying on your back to sitting on the side of a flat bed without using bedrails?: A Little Help needed moving to and from a bed to a chair (including a wheelchair)?: A Little Help needed standing up from a chair using your arms (e.g., wheelchair or bedside chair)?: A Little Help needed to walk in hospital room?: A Little Help needed climbing 3-5 steps  with a railing? : A Lot 6 Click Score: 17    End of Session Equipment Utilized During Treatment: Gait belt Activity Tolerance: Patient tolerated treatment well Patient left:  (on commode with spouse present) Nurse Communication: Mobility status PT Visit Diagnosis: Pain;Hemiplegia and hemiparesis;Muscle weakness (generalized) (M62.81);Unsteadiness on feet (R26.81);Difficulty in walking, not elsewhere classified (R26.2) Hemiplegia - Right/Left: Right Hemiplegia - dominant/non-dominant: Dominant Hemiplegia - caused by: Cerebral infarction Pain - part of body:  (head)     Time: 1330-1400 PT Time Calculation (min) (ACUTE ONLY): 30 min  Charges:  $Gait Training: 8-22 mins $Therapeutic Exercise: 8-22 mins                     Kittie Plater, PT, DPT Acute Rehabilitation Services Secure chat preferred Office #: [phone]    Berline Lopes 04/17/2022, 2:22 PM

## 2022-04-17 NOTE — Progress Notes (Addendum)
ANTICOAGULATION CONSULT NOTE  Pharmacy Consult for heparin Indication:  dural venous sinus thrombosis  Allergies  Allergen Reactions   Grass Pollen(K-O-[id number]-T-Swt Vern)    Patient Measurements: Weight: 106.4 kg (234 lb 9.1 oz) Actual body weight: 86.2 kg IBW: 52.4 kg Heparin Dosing Weight:   71.7 kg  Vital Signs: Temp: 98.2 F (36.8 C) (03/11 0400) Temp Source: Oral (03/11 0400) BP: (P) 144/100 (03/11 0400)  Labs: Recent Labs    04/15/22 0513 04/15/22 0807 04/15/22 1338 04/16/22 0721 04/17/22 0448  HGB 13.3  --   --  13.7 14.2  HCT 40.5  --   --  42.1 44.1  PLT 298  --   --  262 314  HEPARINUNFRC  --    < > 0.32 0.46 0.53  CREATININE 0.80  --   --  0.82 0.86  CKTOTAL 160  --   --   --   --   CKMB 1.4  --   --   --   --   TROPONINIHS 7  --   --   --   --    < > = values in this interval not displayed.    Estimated Creatinine Clearance: 92.4 mL/min (by C-G formula based on SCr of 0.86 mg/dL).   Assessment: 52 yo female with dural venous sinus thrombosis and subarachnoid hemorrhage. Doppler negative.  Pharmacy consulted for IV heparin.   Heparin level slightly supra-therapeutic at 0.53; no bleeding reported, CBC stable.  Goal of Therapy:  Heparin level 0.3-0.5  units/mL Monitor platelets by anticoagulation protocol: Yes   Plan:  Reduce heparin infusion to 1300 units/hr -recheck in 6 hr at 1500  Daily heparin level and CBC F/U with transitioning to oral [facility]  Wilson Singer, PharmD Clinical Pharmacist 04/17/2022 7:24 AM   ---  Addendum -  Patient to be switched to Eliquis 5 mg BID for treatment cerebral dural venous sinus thrombosis per MD.   -stop heparin infusion at time of first eliquis dose -Orders have been adjusted as needed

## 2022-04-17 NOTE — Plan of Care (Signed)

## 2022-04-17 NOTE — Progress Notes (Signed)
STROKE TEAM PROGRESS NOTE   INTERVAL HISTORY  No family at bedside.   Doing better this morning.  Headache still present but mild still worse with sitting up but otherwise no new symptoms   She is able to eat and drink with no nausea vomiting.  She gets a little dizzy when sitting up but not present when laying. Therapist recommend acute inpatient rehab Vitals:   04/17/22 0000 04/17/22 0400 04/17/22 0710 04/17/22 0800  BP: 101/75 (!) (P) 144/100  (!) 158/100  Pulse:      Resp: 15   (!) 21  Temp: 98.6 F (37 C) 98.2 F (36.8 C)  97.6 F (36.4 C)  TempSrc: Oral Oral  Oral  SpO2:    98%  Weight:   106.4 kg    CBC:  Recent Labs  Lab 04/16/22 0721 04/17/22 0448  WBC 6.8 5.7  NEUTROABS 2.4 2.3  HGB 13.7 14.2  HCT 42.1 44.1  MCV 86.4 87.0  PLT 262 [id number]   Basic Metabolic Panel:  Recent Labs  Lab 04/16/22 0721 04/17/22 0448  NA 135 135  K 3.6 3.6  CL 102 101  CO2 22 25  GLUCOSE 81 89  BUN 6 10  CREATININE 0.82 0.86  CALCIUM 8.9 9.0  MG 1.9 1.9  PHOS 2.8 2.8   Lipid Panel:  Recent Labs  Lab 04/15/22 0513  CHOL 154  TRIG 86  HDL 44  CHOLHDL 3.5  VLDL 17  LDLCALC 93   HgbA1c:  Recent Labs  Lab 04/15/22 0513  HGBA1C 5.4   Urine Drug Screen:  Recent Labs  Lab 04/13/22 2221  LABOPIA NONE DETECTED  COCAINSCRNUR POSITIVE*  LABBENZ NONE DETECTED  AMPHETMU NONE DETECTED  THCU NONE DETECTED  LABBARB NONE DETECTED    Alcohol Level  Recent Labs  Lab 04/13/22 2035  ETH <10    IMAGING past 24 hours No results found.  PHYSICAL EXAM General pleasant middle-aged African-American lady no acute distress.  Appears comfortable. Respiratory: Nonlabored  Neuro: Mental Status: Alert awake and oriented to person place time.  Can follow commands.  No aphasia. Cranial Nerves: II: Visual Fields are full. Pupils are equal, round, and reactive to light.   III,IV, VI: EOMI without ptosis or diploplia.  V: Facial sensation is symmetric to temperature VII: Facial  movement is symmetric resting and smiling VIII: Hearing is intact to voice   Motor:  Right upper extremity 4+/5-no drift. RLE 5/5 Left side upper and lower 5/5 Normal tone and bulk.  Sensory: Patient intact to light touch in all 4 extremities.  Cerebellar: Finger-to-nose intact.  No ataxia   ASSESSMENT/PLAN [name] is a 52 y.o. female with history of anxiety hypertension vitamin B and D deficiency presented to the emergency room for evaluation of ongoing headache for the past 3 days.   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 Diffusely hyperdense appearance of the dural venous sinuses raises the possibility for dural venous sinus thrombosis.  CTA/CTV- Occlusive thrombus in the right transverse and sigmoid sinuses, with near occlusive thrombus extending from the vertex superior sagittal sinus to the torcula. The anterior portion of the superior sagittal sinus is patent. The inferior sagittal sinus is somewhat diminutive but appears patent. MRI- Extensive dural venous sinus thrombosis involving the superior sagittal and right transverse/sigmoid sinuses. Venous infarct with subarachnoid hemorrhage centered in the left parietal  cortex. Questionable signal abnormality in the medial left thalamus which may be related to seizure. Repeat CT Head- Extensive dural venous sinus thrombosis as demonstrated by CTV/MRI yesterday. Probable venous infarct left parietal lobe with trace subarachnoid blood versus cortical vein thrombus is stable EEG- This study is within normal limits. No seizures or epileptiform discharges were seen throughout the recording.  2D Echo ejection fraction 70 to 75%. LDL 93 HgbA1c 5.4.  UDS + for cocaine Hypercoagulable labs and ANA pending  Protein S, C; lupus, antithrombin, beta 2,  neg. And CT negative for malignancy.  Possible uterine fibroids. VTE prophylaxis - Heparin IV aspirin 81 mg daily prior to admission, now on heparin IV. Transition to [place] tomorrow. On keppra for seizure prevention for 7 days Therapy recommendations: CIR Disposition:  pending   Hypertension Home meds:  Norvasc '10mg'$ , Coreg 12.'5mg'$ , valsartan-hydrochlorothiazide Stable BP goal < 160 IV Cleviprex - currently off Norvasc resumed   Hyperlipidemia Home meds:  Atorvastatin '10mg'$ , resumed in hospital LDL 93, goal < 70 Atorvastatin  '40mg'$  Continue statin at discharge  Tobacco abuse Current smoker Smoking cessation counseling provided Pt is willing to quit  Cocaine abuse UDS + cocaine Cessation education provided Pt is willing to quit  Other Stroke Risk Factors Obesity, Body mass index is 41.55 kg/m., BMI >/= 30 associated with increased stroke risk, recommend weight loss, diet and exercise as appropriate   Other Active Problems Headache: improving. Prn tramadol/fioricet. Hypokalemia K 2.7 -> 3.4- replaced   Hospital day # 4 I have personally obtained history,examined this patient, reviewed notes, independently viewed imaging studies, participated in medical decision making and plan of care.ROS completed by me personally and pertinent positives fully documented  I have made any additions or clarifications directly to the above note. Agree with note above.  Patient is doing well with improved headache nausea and dizziness.  Plan to switch IV heparin to Eliquis if patient can afford the co-pay.  She was advised to maintain adequate hydration.  She was counseled to quit cocaine and drugs.  Mobilize out of bed.  Transfer to neurology floor bed and to rehab when bed available.  Discussed with Dr. Posey Pronto.  Greater than 50% time during this 50-minute visit were spent in counseling and coordination of care about his cerebral venous sinus stenosis and discussion about treatment and answering  questions  Antony Contras, MD Medical Director [facility] Pager: [phone] 04/17/2022 11:40 AM      To contact Stroke Continuity provider, please refer to [email or web address] After hours, contact General Neurology

## 2022-04-17 NOTE — Progress Notes (Signed)
Inpatient Rehab Admissions Coordinator:  ?Insurance authorization started. Will continue to follow. ? ? ?Sulay Brymer Graves Madden, MS, CCC-SLP ?Admissions Coordinator ?[phone] ? ?

## 2022-04-17 NOTE — Progress Notes (Signed)
[facility] Progress Note Patient: Caty Lige [id number] DOB: 02-01-1971 DOA: 04/13/2022  DOS: the patient was seen and examined on 04/17/2022  Brief hospital course: PMH of HTN, migraine, anxiety, asthma, basal deficiency, vitamin D deficiency, substance abuse.  To the hospital with complaints of right-sided weakness and headache. Originally presented on 04/11/2022 to ED with headache ongoing for 3 days with vomiting.  CT of the head was unremarkable.  Treated symptomatically with improvement in symptoms.  Was discharged on oral Zofran. Return on 3/07-2022 with complaints of right side of the face ear and neck hurting with right ear muffled feeling.  Had right TM erythema with bulging concerning for possible infection treated with Augmentin.  COVID flu and RSV was negative.  Omnicef was sent by PCP outpatient but patient left AMA. Return to ER on 3/7 due to persistent right-sided numbness.  Now admitted with a diagnosis of [place] as well as dural venous sinus thrombosis.  Initially in the ICU.  Neurology consulted.  Started on IV heparin.  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 Presents with recurrent headache likely going since the beginning of the March. Found to have occlusive thrombus in the right transverse and sigmoid sinuses as well as near occlusive thrombus extending from vertex superior sagittal sinus to torcula. CT angio negative for any large vessel occlusion. MRI brain shows venous infarct. Restricted diffusion in the medial left thalamus-seizure? 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 Neurology was consulted. Patient was started on IV heparin as the risk for progressive decline without anticoagulation outweighed the risk for worsening SAH per neurology.  Now on Eliquis.   Currently no focal deficit. Still has photophobia and headache. Improving.  Continue Fioricet and tramadol as needed. Currently on Keppra  for seizure prophylaxis for 7 days.  Last day 3/15.   Hypercoagulable state. Patient presents with dural venous sinus thrombosis. Workup initiated for hypercoagulable state. So far workup is unremarkable and etiology is unclear. CT chest abdomen pelvis negative for any malignant site or any other source of thromboembolism. Reported to have some right ear infection although suspect that his swelling and erythema in the setting of venous sinus thrombosis. Patient is positive for cocaine but no other substance abuse reported. No other fever chills or any other infectious symptoms reported. No other hardware reported by the patient. No prior history of any inflammatory condition. COVID is negative.   Hypertensive emergency. Blood pressure was elevated. Currently on Norvasc,  Reportedly on carvedilol Diovan HCTZ both currently on hold. Monitor.   GERD. Continue PPI.   Hypokalemia. Currently being treated.   Mood disorder. On Prozac. Continue. On chlorphentermine.  Will confirm home regimen and initiate.   HLD. On Lipitor 40 mg daily. Continue.   Substance abuse. Patient is cocaine positive. Counseled the patient to avoid any substance abuse in future.   Class 3 obesity Body mass index is 41.55 kg/m.  Placing the patient at high risk of poor outcome.   Severe B12 deficiency. Will initiate with injections while in the hospital.  Transition to oral tablets on discharge.   Subjective: No nausea no vomiting no fever no chills.  Tolerating Eliquis so far.  Physical Exam: General: in Mild distress, No Rash Cardiovascular: S1 and S2 Present, No Murmur Respiratory: Good respiratory effort, Bilateral Air entry present. No Crackles, No wheezes Abdomen: Bowel Sound present, No tenderness Extremities: No edema Neuro: Alert and oriented x3, no new focal deficit  Data Reviewed: I  have Reviewed nursing notes, Vitals, and Lab results. Since last encounter, pertinent lab results  BMP   .  Will recheck CBC and BMP  Disposition: Status is: Inpatient Remains inpatient appropriate because: Awaiting CIR placement  SCDs Start: 04/13/22 2229 apixaban (ELIQUIS) tablet 5 mg   Family Communication: Family at bedside Level of care: Telemetry Medical continue due to [place]. Vitals:   04/17/22 1000 04/17/22 1152 04/17/22 1200 04/17/22 1516  BP: 130/89  133/88 (!) 130/92  Pulse:    (!) 59  Resp: 18     Temp:  98.3 F (36.8 C)  98.1 F (36.7 C)  TempSrc:  Oral  Oral  SpO2: 98%  98% 100%  Weight:    106.4 kg  Height:    '5\' 3"'$  (1.6 m)     Author: Berle Mull, MD 04/17/2022 6:33 PM  Please look on [email or web address] to find out who is on call.

## 2022-04-17 NOTE — TOC Benefit Eligibility Note (Signed)
Patient Teacher, English as a foreign language completed.    The patient is currently admitted and upon discharge could be taking Eliquis 5 mg.  The current 30 day co-pay is $4.00.   The patient is currently admitted and upon discharge could be taking Pradaxa 150 mg.  The current 30 day co-pay is $4.00.   The patient is insured through [place], [place] Patient Advocate Specialist [place] Patient Advocate Team Direct Number: [phone]  Fax: [phone]

## 2022-04-18 DIAGNOSIS — I639 Cerebral infarction, unspecified: Secondary | ICD-10-CM | POA: Diagnosis not present

## 2022-04-18 LAB — CBC WITH DIFFERENTIAL/PLATELET
Abs Immature Granulocytes: 0.01 10*3/uL (ref 0.00–0.07)
Basophils Absolute: 0.1 10*3/uL (ref 0.0–0.1)
Basophils Relative: 2 %
Eosinophils Absolute: 0.1 10*3/uL (ref 0.0–0.5)
Eosinophils Relative: 2 %
HCT: 41 % (ref 36.0–46.0)
Hemoglobin: 13.8 g/dL (ref 12.0–15.0)
Immature Granulocytes: 0 %
Lymphocytes Relative: 39 %
Lymphs Abs: 2.1 10*3/uL (ref 0.7–4.0)
MCH: 29 pg (ref 26.0–34.0)
MCHC: 33.7 g/dL (ref 30.0–36.0)
MCV: 86.1 fL (ref 80.0–100.0)
Monocytes Absolute: 0.7 10*3/uL (ref 0.1–1.0)
Monocytes Relative: 12 %
Neutro Abs: 2.4 10*3/uL (ref 1.7–7.7)
Neutrophils Relative %: 45 %
Platelets: 280 10*3/uL (ref 150–400)
RBC: 4.76 MIL/uL (ref 3.87–5.11)
RDW: 12.6 % (ref 11.5–15.5)
WBC: 5.4 10*3/uL (ref 4.0–10.5)
nRBC: 0 % (ref 0.0–0.2)

## 2022-04-18 LAB — MAGNESIUM: Magnesium: 1.6 mg/dL — ABNORMAL LOW (ref 1.7–2.4)

## 2022-04-18 LAB — BASIC METABOLIC PANEL
Anion gap: 6 (ref 5–15)
BUN: 6 mg/dL (ref 6–20)
CO2: 20 mmol/L — ABNORMAL LOW (ref 22–32)
Calcium: 7.4 mg/dL — ABNORMAL LOW (ref 8.9–10.3)
Chloride: 110 mmol/L (ref 98–111)
Creatinine, Ser: 0.64 mg/dL (ref 0.44–1.00)
GFR, Estimated: >60 mL/min (ref >60)
Glucose, Bld: 74 mg/dL (ref 70–99)
Potassium: 3.3 mmol/L — ABNORMAL LOW (ref 3.5–5.1)
Sodium: 136 mmol/L (ref 135–145)

## 2022-04-18 LAB — PHOSPHORUS: Phosphorus: 2.9 mg/dL (ref 2.5–4.6)

## 2022-04-18 MED ORDER — IRBESARTAN 300 MG PO TABS
300.0000 mg | ORAL_TABLET | Freq: Every day | ORAL | Status: DC
  Administered 2022-04-18 – 2022-04-19 (×2): 300 mg via ORAL
  Filled 2022-04-18 (×2): qty 1

## 2022-04-18 MED ORDER — MAGNESIUM SULFATE 2 GM/50ML IV SOLN
2.0000 g | Freq: Once | INTRAVENOUS | Status: AC
  Administered 2022-04-18: 2 g via INTRAVENOUS
  Filled 2022-04-18: qty 50

## 2022-04-18 MED ORDER — POTASSIUM CHLORIDE CRYS ER 20 MEQ PO TBCR
40.0000 meq | EXTENDED_RELEASE_TABLET | ORAL | Status: AC
  Administered 2022-04-18 (×2): 40 meq via ORAL
  Filled 2022-04-18 (×2): qty 2

## 2022-04-18 NOTE — Progress Notes (Signed)
[facility] Progress Note Patient: Milliana Marinas [id number] DOB: 1970/12/04 DOA: 04/13/2022  DOS: the patient was seen and examined on 04/18/2022  Brief hospital course: PMH of HTN, migraine, anxiety, asthma, basal deficiency, vitamin D deficiency, substance abuse.  To the hospital with complaints of right-sided weakness and headache. Originally presented on 04/11/2022 to ED with headache ongoing for 3 days with vomiting.  CT of the head was unremarkable.  Treated symptomatically with improvement in symptoms.  Was discharged on oral Zofran. Return on 3/07-2022 with complaints of right side of the face ear and neck hurting with right ear muffled feeling.  Had right TM erythema with bulging concerning for possible infection treated with Augmentin.  COVID flu and RSV was negative.  Omnicef was sent by PCP outpatient but patient left AMA. Return to ER on 3/7 due to persistent right-sided numbness.  Now admitted with a diagnosis of [place] as well as dural venous sinus thrombosis.  Initially in the ICU.  Neurology consulted.  Started on IV heparin.  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 Presents with recurrent headache likely going since the beginning of the March. Found to have occlusive thrombus in the right transverse and sigmoid sinuses as well as near occlusive thrombus extending from vertex superior sagittal sinus to torcula. CT angio negative for any large vessel occlusion. MRI brain shows venous infarct. Restricted diffusion in the medial left thalamus-seizure? 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 Neurology was consulted. Patient was started on IV heparin as the risk for progressive decline without anticoagulation outweighed the risk for worsening SAH per neurology.  Now on Eliquis.   Still has mild headache but resolution of photophobia. Continue Fioricet and tramadol as needed. Currently on Keppra for seizure  prophylaxis for 7 days.  Last day 3/15.   Hypercoagulable state. Patient presents with dural venous sinus thrombosis. Workup initiated for hypercoagulable state. So far workup is unremarkable and etiology is unclear. CT chest abdomen pelvis negative for any malignant site or any other source of thromboembolism. Reported to have some right ear infection although suspect that his swelling and erythema in the setting of venous sinus thrombosis. Patient is positive for cocaine but no other substance abuse reported. No other fever chills or any other infectious symptoms reported. No other hardware reported by the patient. No prior history of any inflammatory condition. COVID is negative.   Hypertensive emergency. Blood pressure was elevated. Currently on Norvasc,  Reportedly on carvedilol-currently on hold due to cocaine positivity as well as bradycardia. Also on Diovan and HCTZ.  Will initiate irbesartan. Monitor.   GERD. Continue PPI.   Hypokalemia. Currently being treated.   Mood disorder. On Prozac. Continue. On chlorphentermine.  Currently on hold.   HLD. On Lipitor 40 mg daily. Continue.   Substance abuse. Patient is cocaine positive. Counseled the patient to avoid any substance abuse in future.   Class 3 obesity Body mass index is 41.47 kg/m.  Placing the patient at high risk of poor outcome.  Hypokalemia. Hypomagnesemia. Replaced.   Severe B12 deficiency. Will initiate with injections while in the hospital.  Transition to oral tablets on discharge.   Subjective: Denies any acute complaint.  No nausea no vomiting no fever no chills.  Continues to have some headache.  Physical Exam: Clear to auscultation. S1-S2 present. Bowel sound present. No focal deficit.  Data Reviewed: I have Reviewed nursing notes, Vitals, and Lab results. Reviewed BMP.  Will recheck ordered BMP  and magnesium.  Disposition: Status is: Inpatient Remains inpatient appropriate  because: Awaiting CIR placement  SCDs Start: 04/13/22 2229 apixaban (ELIQUIS) tablet 5 mg   Family Communication: Family at bedside Level of care: Telemetry Medical continue due to [place]. Vitals:   04/18/22 0935 04/18/22 1150 04/18/22 1643 04/18/22 2025  BP: (!) 148/102 (!) 153/87 (!) 138/94 135/83  Pulse: 61 (!) 58 62 65  Resp: '16 17 20 19  '$ Temp: 98.3 F (36.8 C) 97.7 F (36.5 C) (!) 97.4 F (36.3 C) (!) 97.5 F (36.4 C)  TempSrc: Oral Axillary Oral Oral  SpO2: 99% 100% 99% 98%  Weight:      Height:         Author: Berle Mull, MD 04/18/2022 10:26 PM  Please look on [email or web address] to find out who is on call.

## 2022-04-18 NOTE — Plan of Care (Signed)

## 2022-04-18 NOTE — Progress Notes (Signed)
Inpatient Rehabilitation Admissions Coordinator   I await insurance determination for possible Cir admit. Patient progressing well. States she is going to bathroom unassisted when she does not feel dizzy. May progress to not need CIR admit.  Danne Baxter, RN, MSN Rehab Admissions Coordinator [phone] 04/18/2022 1:14 PM

## 2022-04-18 NOTE — Discharge Instructions (Addendum)
Information on my medicine - ELIQUIS® (apixaban) ° °This medication education was reviewed with me or my healthcare representative as part of my discharge preparation.   ° ° °Why was Eliquis® prescribed for you? °Eliquis® was prescribed for you to reduce the risk of a blood clot forming that can cause a stroke. ° °What do You need to know about Eliquis® ? °Take your Eliquis® TWICE DAILY - one tablet in the morning and one tablet in the evening with or without food. If you have difficulty swallowing the tablet whole please discuss with your pharmacist how to take the medication safely. ° °Take Eliquis® exactly as prescribed by your doctor and DO NOT stop taking Eliquis® without talking to the doctor who prescribed the medication.  Stopping may increase your risk of developing a stroke.  Refill your prescription before you run out. ° °After discharge, you should have regular check-up appointments with your healthcare provider that is prescribing your Eliquis®.  In the future your dose may need to be changed if your kidney function or weight changes by a significant amount or as you get older. ° °What do you do if you miss a dose? °If you miss a dose, take it as soon as you remember on the same day and resume taking twice daily.  Do not take more than one dose of ELIQUIS at the same time to make up a missed dose. ° °Important Safety Information °A possible side effect of Eliquis® is bleeding. You should call your healthcare provider right away if you experience any of the following: °Bleeding from an injury or your nose that does not stop. °Unusual colored urine (red or dark brown) or unusual colored stools (red or black). °Unusual bruising for unknown reasons. °A serious fall or if you hit your head (even if there is no bleeding). ° °Some medicines may interact with Eliquis® and might increase your risk of bleeding or clotting while on Eliquis®. To help avoid this, consult your healthcare provider or pharmacist prior  to using any new prescription or non-prescription medications, including herbals, vitamins, non-steroidal anti-inflammatory drugs (NSAIDs) and supplements. ° °This website has more information on Eliquis® (apixaban): http://www.eliquis.com/eliquis/home  °

## 2022-04-18 NOTE — Progress Notes (Signed)
STROKE TEAM PROGRESS NOTE   INTERVAL HISTORY  No family at bedside.   Sitting up in bed.  She just vomited and complains of nausea.  She is getting Zofran.  Headache still present but mild and getting Fioricet and tramadol as needed.  She is able to eat and drink well.  Her dizziness is improving. Therapist recommend acute inpatient rehab Vitals:   04/17/22 2352 04/18/22 0340 04/18/22 0500 04/18/22 0935  BP: 119/80 (!) 130/92  (!) 148/102  Pulse: 70 66  61  Resp: '14 14  16  '$ Temp: 98 F (36.7 C) 98 F (36.7 C)  98.3 F (36.8 C)  TempSrc: Oral Oral  Oral  SpO2: 99% 98%  99%  Weight:   106.2 kg   Height:       CBC:  Recent Labs  Lab 04/17/22 0448 04/18/22 0248  WBC 5.7 5.4  NEUTROABS 2.3 2.4  HGB 14.2 13.8  HCT 44.1 41.0  MCV 87.0 86.1  PLT 314 [id number]   Basic Metabolic Panel:  Recent Labs  Lab 04/17/22 0448 04/18/22 0248  NA 135 136  K 3.6 3.3*  CL 101 110  CO2 25 20*  GLUCOSE 89 74  BUN 10 6  CREATININE 0.86 0.64  CALCIUM 9.0 7.4*  MG 1.9 1.6*  PHOS 2.8 2.9   Lipid Panel:  Recent Labs  Lab 04/15/22 0513  CHOL 154  TRIG 86  HDL 44  CHOLHDL 3.5  VLDL 17  LDLCALC 93   HgbA1c:  Recent Labs  Lab 04/15/22 0513  HGBA1C 5.4   Urine Drug Screen:  Recent Labs  Lab 04/13/22 2221  LABOPIA NONE DETECTED  COCAINSCRNUR POSITIVE*  LABBENZ NONE DETECTED  AMPHETMU NONE DETECTED  THCU NONE DETECTED  LABBARB NONE DETECTED    Alcohol Level  Recent Labs  Lab 04/13/22 2035  ETH <10    IMAGING past 24 hours No results found.  PHYSICAL EXAM General pleasant middle-aged African-American lady no acute distress.  Appears comfortable. Respiratory: Nonlabored  Neuro: Mental Status: Alert awake and oriented to person place time.  Can follow commands.  No aphasia. Cranial Nerves: II: Visual Fields are full. Pupils are equal, round, and reactive to light.   III,IV, VI: EOMI without ptosis or diploplia.  V: Facial sensation is symmetric to temperature VII:  Facial movement is symmetric resting and smiling VIII: Hearing is intact to voice   Motor:  Right upper extremity 4+/5-no drift. RLE 5/5 Left side upper and lower 5/5 Normal tone and bulk.  Sensory: Patient intact to light touch in all 4 extremities.  Cerebellar: Finger-to-nose intact.  No ataxia   ASSESSMENT/PLAN [name] is a 52 y.o. female with history of anxiety hypertension vitamin B and D deficiency presented to the emergency room for evaluation of ongoing headache for the past 3 days.   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 Diffusely hyperdense appearance of the dural venous sinuses raises the possibility for dural venous sinus thrombosis.  CTA/CTV- Occlusive thrombus in the right transverse and sigmoid sinuses, with near occlusive thrombus extending from the vertex superior sagittal sinus to the torcula. The anterior portion of the superior sagittal sinus is patent. The inferior sagittal sinus is somewhat diminutive but appears patent. MRI- Extensive dural venous sinus thrombosis involving the superior sagittal and right transverse/sigmoid sinuses. Venous infarct with subarachnoid hemorrhage centered in the left  parietal cortex. Questionable signal abnormality in the medial left thalamus which may be related to seizure. Repeat CT Head- Extensive dural venous sinus thrombosis as demonstrated by CTV/MRI yesterday. Probable venous infarct left parietal lobe with trace subarachnoid blood versus cortical vein thrombus is stable EEG- This study is within normal limits. No seizures or epileptiform discharges were seen throughout the recording.  2D Echo ejection fraction 70 to 75%. LDL 93 HgbA1c 5.4.  UDS + for cocaine Hypercoagulable labs and ANA pending  Protein S, C; lupus, antithrombin,  beta 2, neg. And CT negative for malignancy.  Possible uterine fibroids. VTE prophylaxis - Heparin IV aspirin 81 mg daily prior to admission, now on heparin IV. Transition to [place] tomorrow. On keppra for seizure prevention for 7 days Therapy recommendations: CIR Disposition:  pending   Hypertension Home meds:  Norvasc '10mg'$ , Coreg 12.'5mg'$ , valsartan-hydrochlorothiazide Stable BP goal < 160 IV Cleviprex - currently off Norvasc resumed   Hyperlipidemia Home meds:  Atorvastatin '10mg'$ , resumed in hospital LDL 93, goal < 70 Atorvastatin  '40mg'$  Continue statin at discharge  Tobacco abuse Current smoker Smoking cessation counseling provided Pt is willing to quit  Cocaine abuse UDS + cocaine Cessation education provided Pt is willing to quit  Other Stroke Risk Factors Obesity, Body mass index is 41.47 kg/m., BMI >/= 30 associated with increased stroke risk, recommend weight loss, diet and exercise as appropriate   Other Active Problems Headache: improving. Prn tramadol/fioricet. Hypokalemia K 2.7 -> 3.4- replaced   Hospital day # 5 Patient is doing well with improved headache nausea and dizziness.  Plan to continue Eliquis for 6 to 9 months at least and likely switch to aspirin after that unless she has some primary hypercoagulable disorder..  She was advised to maintain adequate hydration.  She was counseled to quit cocaine and drugs.  Follow-up with an outpatient stroke clinic with me in 2 months.  Stroke team will sign off.  Kindly call for questions discussed with Dr. Posey Pronto.  Greater than 50% time during this 35-minute visit were spent in counseling and coordination of care about his cerebral venous sinus stenosis and discussion about treatment and answering questions  Antony Contras, MD Medical Director [place] Pager: [phone] 04/18/2022 10:41 AM      To contact Stroke Continuity provider, please refer to [email or web address] After hours, contact General  Neurology

## 2022-04-18 NOTE — Progress Notes (Signed)
Occupational Therapy Treatment Patient Details Name: Catherine Kane MRN: [id number] DOB: 30-Aug-1970 Today's Date: 04/18/2022   History of present illness 52 y/o female who presents on 3/7 with headache, speech difficulty, right sided numbness. 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 Questionable signal abnormality in the medial left thalamus which may be related to seizure. + cocaine. PMH includes migraines, essential hypertension, anxiety and a history of COVID.   OT comments  Pt progressed with OT this session R UE use for fine motor to remove braids. Pt fatigued with R UE and needing rest breaks with L UE only use. Pt benefits from mirror seating to use bil UE to take out braids. Pt c/o nausea and HA and tolerates session seated. Pt requesting to return to supine due increased nausea. Recommendation CIR at this time. Pt continues to have light sensitivity at this time.    Recommendations for follow up therapy are one component of a multi-disciplinary discharge planning process, led by the attending physician.  Recommendations may be updated based on patient status, additional functional criteria and insurance authorization.    Follow Up Recommendations  Acute inpatient rehab (3hours/day)     Assistance Recommended at Discharge Set up Supervision/Assistance  Patient can return home with the following  A little help with walking and/or transfers;A little help with bathing/dressing/bathroom   Equipment Recommendations  Other (comment)    Recommendations for Other Services Rehab consult    Precautions / Restrictions Precautions Precautions: Fall       Mobility Bed Mobility Overal bed mobility: Needs Assistance Bed Mobility: Supine to Sit, Sit to Supine Rolling: Supervision Sidelying to sit: Supervision            Transfers                          Balance Overall balance assessment: Needs assistance Sitting-balance support: No upper extremity supported, Feet supported Sitting balance-Leahy Scale: Good     Standing balance support: Single extremity supported, During functional activity, Reliant on assistive device for balance Standing balance-Leahy Scale: Fair                             ADL either performed or assessed with clinical judgement   ADL Overall ADL's : Needs assistance/impaired     Grooming: Moderate assistance;Sitting Grooming Details (indicate cue type and reason): pt sitting at sink surface and working on taking out her braids. pt needed increased time to complete the fine motor task. pt benefits from visual attention of the mirror to see R hand.                 Toilet Transfer: Min guard;Ambulation;BSC/3in1           Functional mobility during ADLs: Min guard (holding to environmental supports to transfer from bed to sink surface)      Extremity/Trunk Assessment Upper Extremity Assessment Upper Extremity Assessment: RUE deficits/detail RUE Deficits / Details: decreased fine motor   Lower Extremity Assessment Lower Extremity Assessment: Defer to PT evaluation        Vision       Perception     Praxis      Cognition Arousal/Alertness: Awake/alert Behavior During Therapy: WFL for tasks assessed/performed Overall Cognitive Status: Within Functional Limits for tasks assessed  Exercises      Shoulder Instructions       General Comments VSS, pt reports HA and vomiting a little bit prior to OT arrival. pt still agreeable to therapy session. Pt using hair braids as fine motor task and R UE use during session    Pertinent Vitals/ Pain       Pain Assessment Pain Assessment: 0-10 Pain Score: 6  Pain Location: head Pain Descriptors / Indicators: Headache Pain Intervention(s): Monitored  during session, Premedicated before session, Repositioned  Home Living                                          Prior Functioning/Environment              Frequency  Min 2X/week        Progress Toward Goals  OT Goals(current goals can now be found in the care plan section)  Progress towards OT goals: Progressing toward goals  Acute Rehab OT Goals Patient Stated Goal: to get rid of this headache OT Goal Formulation: With patient Time For Goal Achievement: 04/29/22 Potential to Achieve Goals: Good ADL Goals Pt Will Transfer to Toilet: with supervision;ambulating Additional ADL Goal #1: pt will demonstrate HEP fine motor with R UE with written instruction Additional ADL Goal #2: pt will demonstrate basic transfer mod I with RW  Plan Discharge plan remains appropriate    Co-evaluation                 AM-PAC OT "6 Clicks" Daily Activity     Outcome Measure   Help from another person eating meals?: None Help from another person taking care of personal grooming?: A Little Help from another person toileting, which includes using toliet, bedpan, or urinal?: A Little Help from another person bathing (including washing, rinsing, drying)?: A Little Help from another person to put on and taking off regular upper body clothing?: A Little Help from another person to put on and taking off regular lower body clothing?: A Little 6 Click Score: 19    End of Session    OT Visit Diagnosis: Unsteadiness on feet (R26.81);Muscle weakness (generalized) (M62.81)   Activity Tolerance Patient tolerated treatment well   Patient Left in bed;with call bell/phone within reach;with bed alarm set   Nurse Communication Mobility status;Precautions        Time: 1007-1050 OT Time Calculation (min): 43 min  Charges: OT General Charges $OT Visit: 1 Visit OT Treatments $Self Care/Home Management : 38-52 mins   [place], OTR/L  Acute Rehabilitation  Services Office: [phone] .   Jeri Modena 04/18/2022, 11:31 AM

## 2022-04-19 ENCOUNTER — Other Ambulatory Visit (HOSPITAL_COMMUNITY): Payer: Self-pay

## 2022-04-19 LAB — BASIC METABOLIC PANEL
Anion gap: 8 (ref 5–15)
BUN: 7 mg/dL (ref 6–20)
CO2: 22 mmol/L (ref 22–32)
Calcium: 8.9 mg/dL (ref 8.9–10.3)
Chloride: 104 mmol/L (ref 98–111)
Creatinine, Ser: 0.88 mg/dL (ref 0.44–1.00)
GFR, Estimated: >60 mL/min (ref >60)
Glucose, Bld: 94 mg/dL (ref 70–99)
Potassium: 4.2 mmol/L (ref 3.5–5.1)
Sodium: 134 mmol/L — ABNORMAL LOW (ref 135–145)

## 2022-04-19 LAB — MAGNESIUM: Magnesium: 2.1 mg/dL (ref 1.7–2.4)

## 2022-04-19 MED ORDER — ACETAMINOPHEN 325 MG PO TABS: 650.0000 mg | ORAL_TABLET | Freq: Four times a day (QID) | ORAL | Status: DC | PRN

## 2022-04-19 MED ORDER — ONDANSETRON HCL 4 MG PO TABS
4.0000 mg | ORAL_TABLET | Freq: Three times a day (TID) | ORAL | 0 refills | Status: DC | PRN
  Filled 2022-04-19: qty 10, 4d supply, fill #0

## 2022-04-19 MED ORDER — LEVETIRACETAM 500 MG PO TABS
500.0000 mg | ORAL_TABLET | Freq: Two times a day (BID) | ORAL | 0 refills | Status: DC
  Filled 2022-04-19: qty 4, 2d supply, fill #0

## 2022-04-19 MED ORDER — ATORVASTATIN CALCIUM 40 MG PO TABS
40.0000 mg | ORAL_TABLET | Freq: Every day | ORAL | 0 refills | Status: DC
  Filled 2022-04-19: qty 30, 30d supply, fill #0

## 2022-04-19 MED ORDER — DOCUSATE SODIUM 100 MG PO CAPS
100.0000 mg | ORAL_CAPSULE | Freq: Every day | ORAL | 0 refills | Status: DC
  Filled 2022-04-19: qty 10, 10d supply, fill #0

## 2022-04-19 MED ORDER — APIXABAN 5 MG PO TABS
5.0000 mg | ORAL_TABLET | Freq: Two times a day (BID) | ORAL | 0 refills | Status: DC
  Filled 2022-04-19: qty 60, 30d supply, fill #0

## 2022-04-19 MED ORDER — BUTALBITAL-APAP-CAFFEINE 50-325-40 MG PO TABS
1.0000 | ORAL_TABLET | ORAL | 0 refills | Status: DC | PRN
  Filled 2022-04-19: qty 14, 3d supply, fill #0

## 2022-04-19 MED ORDER — CYANOCOBALAMIN 1000 MCG PO TABS
1000.0000 ug | ORAL_TABLET | Freq: Every day | ORAL | 0 refills | Status: AC
  Filled 2022-04-19: qty 30, 30d supply, fill #0

## 2022-04-19 NOTE — Discharge Summary (Signed)
Physician Discharge Summary  Catherine Kane [id number] DOB: 1970/12/26 DOA: 04/13/2022  PCP: Elsie Stain, MD  Admit date: 04/13/2022 Discharge date: 04/19/2022  Admitted From: Home Disposition: Home with outpatient therapies  Recommendations for Outpatient Follow-up:  Follow up with PCP in 1-2 weeks   Home Health: Outpatient PT OT Equipment/Devices: Walker  Discharge Condition: Stable CODE STATUS: Full code Diet recommendation: Low-salt diet  Discharge summary: PMH of HTN, migraine, anxiety, asthma, basal deficiency, vitamin D deficiency, substance abuse.  To the hospital with complaints of right-sided weakness and headache. Originally presented on 04/11/2022 to ED with headache ongoing for 3 days with vomiting.  CT of the head was unremarkable.  Treated symptomatically with improvement in symptoms.  Was discharged on oral Zofran. Return on 3/07-2022 with complaints of right side of the face ear and neck hurting with right ear muffled feeling.  Had right TM erythema with bulging concerning for possible infection treated with Augmentin.  COVID flu and RSV was negative.  Omnicef was sent by PCP outpatient but patient left AMA. Return to ER on 3/7 due to persistent right-sided numbness.  Now admitted with a diagnosis of [place] as well as dural venous sinus thrombosis.  Initially in the ICU.  Neurology consulted.  Started on IV heparin.   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 Presented with recurrent headache likely going since the beginning of the March. Found to have occlusive thrombus in the right transverse and sigmoid sinuses as well as near occlusive thrombus extending from vertex superior sagittal sinus to torcula. CT angio negative for any large vessel occlusion. MRI brain shows venous infarct. 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 Neurology was consulted. Patient was started on IV heparin as the risk for progressive decline  without anticoagulation outweighed the risk for worsening SAH per neurology.  Now on Eliquis.   Still has mild headache but resolution of photophobia. Continue Fioricet and tylenol as needed. Currently on Keppra for seizure prophylaxis for 7 days.  Last day 3/15.   Hypercoagulable state. Patient presented with dural venous sinus thrombosis. Workup initiated for hypercoagulable state. So far workup is unremarkable and etiology is unclear. CT chest abdomen pelvis negative for any malignant site or any other source of thromboembolism. Reported to have some right ear infection although suspect that his swelling and erythema in the setting of venous sinus thrombosis. Patient is positive for cocaine. No other fever chills or any other infectious symptoms reported. No other hardware reported by the patient. No prior history of any inflammatory condition. COVID is negative.   Hypertensive emergency. Blood pressure was elevated. Resume Norvasc, Coreg, Diovan and hydrochlorothiazide.  Now is stable.   GERD. Continue PPI.   Hypokalemia. Replaced and adequate.   Mood disorder. On Prozac. Continue. On chlorphentermine for allergies.   HLD. On Lipitor 40 mg daily. Continue.   Substance abuse. Patient is cocaine positive. Counseled the patient to avoid any substance abuse in future.   Class 3 obesity Body mass index is 41.47 kg/m.  Placing the patient at high risk of poor outcome.   Severe B12 deficiency.  B12 levels 139. Patient received 3 days of B12 injection, will discharge with oral supplement.  Patient was waiting in the hospital to go to inpatient rehab, however she did good clinical recovery and currently able to go home with family.  She will do outpatient therapies.    Discharge Diagnoses:  Principal Problem:   Stroke (cerebrum) ([place])  Discharge Instructions  Discharge Instructions     Ambulatory referral to Occupational Therapy   Complete by: As directed     Ambulatory referral to Physical Therapy   Complete by: As directed    Call MD for:  persistant nausea and vomiting   Complete by: As directed    Call MD for:  severe uncontrolled pain   Complete by: As directed    Diet - low sodium heart healthy   Complete by: As directed    Increase activity slowly   Complete by: As directed       Allergies as of 04/19/2022       Reactions   Grass Pollen([id number] Vern)         Medication List     STOP taking these medications    aspirin EC 81 MG tablet   benzonatate 100 MG capsule Commonly known as: TESSALON   cefdinir 300 MG capsule Commonly known as: OMNICEF   Flutter Devi   potassium chloride 10 MEQ tablet Commonly known as: KLOR-CON       TAKE these medications    acetaminophen 325 MG tablet Commonly known as: TYLENOL Take 2 tablets (650 mg total) by mouth every 6 (six) hours as needed for headache (1st line).   amLODipine 10 MG tablet Commonly known as: NORVASC Take 1 tablet (10 mg total) by mouth daily.   apixaban 5 MG Tabs tablet Commonly known as: ELIQUIS Take 1 tablet (5 mg total) by mouth 2 (two) times daily.   atorvastatin 40 MG tablet Commonly known as: LIPITOR Take 1 tablet (40 mg total) by mouth daily. What changed:  medication strength how much to take   butalbital-acetaminophen-caffeine 50-325-40 MG tablet Commonly known as: FIORICET Take 1 tablet by mouth every 4 (four) hours as needed for headache or migraine.   carvedilol 12.5 MG tablet Commonly known as: COREG Take 1 tablet (12.5 mg total) by mouth 2 (two) times daily with a meal.   chlorpheniramine 4 MG tablet Commonly known as: CHLOR-TRIMETON Take 4 mg by mouth every 4 (four) hours as needed for allergies.   cyanocobalamin 1000 MCG tablet Take 1 tablet (1,000 mcg total) by mouth daily. Start taking on: April 20, 2022   diclofenac Sodium 1 % Gel Commonly known as: Voltaren Apply 2 g topically 4 (four) times daily. To painful  joints What changed:  when to take this reasons to take this additional instructions   docusate sodium 100 MG capsule Commonly known as: COLACE Take 1 capsule (100 mg total) by mouth daily. Start taking on: April 20, 2022   FLUoxetine 40 MG capsule Commonly known as: PROZAC Take 1 capsule (40 mg total) by mouth daily.   Iron 325 (65 Fe) MG Tabs Take 1 tablet by mouth daily.   levETIRAcetam 500 MG tablet Commonly known as: KEPPRA Take 1 tablet (500 mg total) by mouth 2 (two) times daily for 2 days.   montelukast 10 MG tablet Commonly known as: SINGULAIR Take 1 tablet (10 mg total) by mouth at bedtime.   ondansetron 4 MG tablet Commonly known as: Zofran Take 1 tablet (4 mg total) by mouth every 8 (eight) hours as needed for nausea or vomiting.   pantoprazole 40 MG tablet Commonly known as: PROTONIX Take 1 tablet (40 mg total) by mouth daily. Take 30-60 minutes before the first meal of the day.   traZODone 100 MG tablet Commonly known as: DESYREL Take 1 tablet (100 mg total) by mouth at bedtime. What changed:  when to take this reasons to take this   valsartan-hydrochlorothiazide 320-25 MG tablet Commonly known as: DIOVAN-HCT Take 1 tablet by mouth daily.   Ventolin HFA 108 (90 Base) MCG/ACT inhaler Generic drug: albuterol Inhale 2 puffs into the lungs every 6 (six) hours as needed for wheezing or shortness of breath.   Vitamin D (Ergocalciferol) 1.25 MG (50000 UNIT) Caps capsule Commonly known as: DRISDOL Take 1 capsule (50,000 Units total) by mouth every 7 (seven) days.               Durable Medical Equipment  (From admission, onward)           Start     Ordered   04/19/22 1615  For home use only DME Walker rolling  Once       Question Answer Comment  Walker: With [place]   Patient needs a walker to treat with the following condition Stroke ([place])      04/19/22 1615   04/19/22 1615  For home use only DME Tub bench  Once        04/19/22  1615            Follow-up [place]. Schedule an appointment as soon as possible for a visit in 1 week(s).   Specialty: Rehabilitation Contact information: [address] [place] [id number] [place] [address] [phone]               Allergies  Allergen Reactions   Grass Pollen([id number]-Swt Vern)     Consultations: Neurology   Procedures/Studies: VAS [place] LOWER EXTREMITY VENOUS (DVT)  Result Date: 04/16/2022  Lower Venous DVT Study Patient Name:  Catherine Kane  Date of Exam:   04/15/2022 Medical Rec #: [id number]                   Accession #:    [id number] Date of Birth: August 04, 1970                   Patient Gender: F Patient Age:   14 years Exam Location:  [facility] Procedure:      VAS [place] LOWER EXTREMITY VENOUS (DVT) Referring Phys: PRANAV PATEL --------------------------------------------------------------------------------  Indications: Venous thromboembolism (VTE) [id number].  Risk Factors: None identified. Comparison Study: No prior studies. Performing Technologist: Oliver Hum RVT  Examination Guidelines: A complete evaluation includes B-mode imaging, spectral Doppler, color Doppler, and power Doppler as needed of all accessible portions of each vessel. Bilateral testing is considered an integral part of a complete examination. Limited examinations for reoccurring indications may be performed as noted. The reflux portion of the exam is performed with the patient in reverse Trendelenburg.  +---------+---------------+---------+-----------+----------+--------------+ RIGHT    CompressibilityPhasicitySpontaneityPropertiesThrombus Aging +---------+---------------+---------+-----------+----------+--------------+ CFV      Full           Yes      Yes                                 +---------+---------------+---------+-----------+----------+--------------+ SFJ      Full                                                         +---------+---------------+---------+-----------+----------+--------------+ FV Prox  Full                                                        +---------+---------------+---------+-----------+----------+--------------+  FV Mid   Full                                                        +---------+---------------+---------+-----------+----------+--------------+ FV DistalFull                                                        +---------+---------------+---------+-----------+----------+--------------+ PFV      Full                                                        +---------+---------------+---------+-----------+----------+--------------+ POP      Full           Yes      Yes                                 +---------+---------------+---------+-----------+----------+--------------+ PTV      Full                                                        +---------+---------------+---------+-----------+----------+--------------+ PERO     Full                                                        +---------+---------------+---------+-----------+----------+--------------+   +---------+---------------+---------+-----------+----------+--------------+ LEFT     CompressibilityPhasicitySpontaneityPropertiesThrombus Aging +---------+---------------+---------+-----------+----------+--------------+ CFV      Full           Yes      Yes                                 +---------+---------------+---------+-----------+----------+--------------+ SFJ      Full                                                        +---------+---------------+---------+-----------+----------+--------------+ FV Prox  Full                                                        +---------+---------------+---------+-----------+----------+--------------+ FV Mid   Full                                                         +---------+---------------+---------+-----------+----------+--------------+  FV DistalFull                                                        +---------+---------------+---------+-----------+----------+--------------+ PFV      Full                                                        +---------+---------------+---------+-----------+----------+--------------+ POP      Full           Yes      Yes                                 +---------+---------------+---------+-----------+----------+--------------+ PTV      Full                                                        +---------+---------------+---------+-----------+----------+--------------+ PERO     Full                                                        +---------+---------------+---------+-----------+----------+--------------+     Summary: RIGHT: - There is no evidence of deep vein thrombosis in the lower extremity.  - No cystic structure found in the popliteal fossa.  LEFT: - There is no evidence of deep vein thrombosis in the lower extremity.  - No cystic structure found in the popliteal fossa.  *See table(s) above for measurements and observations. Electronically signed by Jamelle Haring on 04/16/2022 at 4:27:08 PM.    Final    ECHOCARDIOGRAM COMPLETE  Result Date: 04/15/2022    ECHOCARDIOGRAM REPORT   Patient Name:   Catherine Kane Date of Exam: 04/15/2022 Medical Rec #:  [id number]                  Height:       63.0 in Accession #:    [id number]                 Weight:       234.6 lb Date of Birth:  1971/02/02                  BSA:          2.069 m Patient Age:    30 years                   BP:           118/67 mmHg Patient Gender: F                          HR:           67 bpm. Exam Location:  Inpatient Procedure: 2D Echo, Cardiac Doppler and Color Doppler Indications:    I63.9 Stroke  History:        Patient has prior history of Echocardiogram examinations, most                 recent 04/26/2017.  Stroke; Risk Factors:Hypertension,                 Dyslipidemia and Non-Smoker.  Sonographer:    Wilkie Aye RVT RCS Referring Phys: [id number] [place]  1. Left ventricular ejection fraction, by estimation, is 70 to 75%. The left ventricle has hyperdynamic function. Left ventricular endocardial border not optimally defined to evaluate regional wall motion. There is moderate left ventricular hypertrophy.  Left ventricular diastolic parameters are consistent with Grade I diastolic dysfunction (impaired relaxation).  2. Right ventricular systolic function is normal. The right ventricular size is normal.  3. The mitral valve is normal in structure. No evidence of mitral valve regurgitation. No evidence of mitral stenosis.  4. The aortic valve is tricuspid. There is mild calcification of the aortic valve. There is mild thickening of the aortic valve. Aortic valve regurgitation is not visualized. No aortic stenosis is present.  5. The inferior vena cava is normal in size with greater than 50% respiratory variability, suggesting right atrial pressure of 3 mmHg. FINDINGS  Left Ventricle: Left ventricular ejection fraction, by estimation, is 70 to 75%. The left ventricle has hyperdynamic function. Left ventricular endocardial border not optimally defined to evaluate regional wall motion. The left ventricular internal cavity size was normal in size. There is moderate left ventricular hypertrophy. Left ventricular diastolic parameters are consistent with Grade I diastolic dysfunction (impaired relaxation). Normal left ventricular filling pressure. Right Ventricle: The right ventricular size is normal. Right vetricular wall thickness was not well visualized. Right ventricular systolic function is normal. Left Atrium: Left atrial size was normal in size. Right Atrium: Right atrial size was normal in size. Pericardium: There is no evidence of pericardial effusion. Mitral Valve: The mitral valve is normal in structure.  There is mild thickening of the mitral valve leaflet(s). There is mild calcification of the mitral valve leaflet(s). Mild mitral annular calcification. No evidence of mitral valve regurgitation. No evidence of mitral valve stenosis. Tricuspid Valve: The tricuspid valve is normal in structure. Tricuspid valve regurgitation is not demonstrated. No evidence of tricuspid stenosis. Aortic Valve: The aortic valve is tricuspid. There is mild calcification of the aortic valve. There is mild thickening of the aortic valve. There is mild aortic valve annular calcification. Aortic valve regurgitation is not visualized. No aortic stenosis  is present. Aortic valve mean gradient measures 4.0 mmHg. Aortic valve peak gradient measures 8.1 mmHg. Aortic valve area, by VTI measures 3.22 cm. Pulmonic Valve: The pulmonic valve was not well visualized. Pulmonic valve regurgitation is not visualized. No evidence of pulmonic stenosis. Aorta: The aortic root is normal in size and structure. Venous: The inferior vena cava is normal in size with greater than 50% respiratory variability, suggesting right atrial pressure of 3 mmHg. IAS/Shunts: No atrial level shunt detected by color flow Doppler.  LEFT VENTRICLE PLAX 2D LVIDd:         4.30 cm   Diastology LVIDs:         2.50 cm   LV e' medial:    7.83 cm/s LV PW:         1.40 cm   LV E/e' medial:  11.5 LV IVS:        1.40 cm   LV e' lateral:   8.49 cm/s LVOT diam:  2.30 cm   LV E/e' lateral: 10.6 LV SV:         87 LV SV Index:   42 LVOT Area:     4.15 cm  RIGHT VENTRICLE             IVC RV Basal diam:  3.60 cm     IVC diam: 1.20 cm RV Mid diam:    3.00 cm RV S prime:     17.30 cm/s TAPSE (M-mode): 2.3 cm LEFT ATRIUM             Index        RIGHT ATRIUM           Index LA diam:        3.30 cm 1.59 cm/m   RA Area:     10.50 cm LA Vol (A2C):   45.1 ml 21.80 ml/m  RA Volume:   19.30 ml  9.33 ml/m LA Vol (A4C):   34.1 ml 16.48 ml/m LA Biplane Vol: 40.8 ml 19.72 ml/m  AORTIC VALVE                     PULMONIC VALVE AV Area (Vmax):    3.19 cm     PV Vmax:       0.76 m/s AV Area (Vmean):   3.08 cm     PV Peak grad:  2.3 mmHg AV Area (VTI):     3.22 cm AV Vmax:           142.00 cm/s AV Vmean:          93.000 cm/s AV VTI:            0.271 m AV Peak Grad:      8.1 mmHg AV Mean Grad:      4.0 mmHg LVOT Vmax:         109.00 cm/s LVOT Vmean:        69.000 cm/s LVOT VTI:          0.210 m LVOT/AV VTI ratio: 0.77  AORTA Ao Root diam: 3.00 cm Ao Asc diam:  3.20 cm Ao Arch diam: 3.1 cm MITRAL VALVE MV Area (PHT): 3.53 cm     SHUNTS MV Decel Time: 215 msec     Systemic VTI:  0.21 m MV E velocity: 89.80 cm/s   Systemic Diam: 2.30 cm MV A velocity: 112.00 cm/s MV E/A ratio:  0.80 Carlyle Dolly MD Electronically signed by Carlyle Dolly MD Signature Date/Time: 04/15/2022/1:54:11 PM    Final    CT CHEST ABDOMEN PELVIS W CONTRAST  Result Date: 04/15/2022 CLINICAL DATA:  Occlusive thrombus in the right transverse and sigmoid sinuses and in part of the superior sagittal sinus with proximal occlusion of the right IJ vein. Reference CT head venogram 04/13/2022. EXAM: CT CHEST, ABDOMEN, AND PELVIS WITH CONTRAST TECHNIQUE: Multidetector CT imaging of the chest, abdomen and pelvis was performed following the standard protocol during bolus administration of intravenous contrast. RADIATION DOSE REDUCTION: This exam was performed according to the departmental dose-optimization program which includes automated exposure control, adjustment of the mA and/or kV according to patient size and/or use of iterative reconstruction technique. CONTRAST:  24m OMNIPAQUE IOHEXOL 350 MG/ML SOLN COMPARISON:  CT abdomen and pelvis without contrast 01/10/2016. FINDINGS: CT CHEST FINDINGS Cardiovascular: The heart is slightly enlarged. Pulmonary arteries and veins are normal caliber. Pulmonary arteries are centrally clear There is no pericardial effusion. There are no visible coronary artery calcifications. There is minimal calcific plaque  in the distal aortic arch. The aorta and great vessels are otherwise unremarkable. No focal thrombosis or occlusion is seen in the axillary veins, both subclavian veins, brachiocephalic arch and SVC, or in the visualized inferior portions of the internal jugular veins. Mediastinum/Nodes: There is a 6 mm densely calcified nodule in the left lobe of thyroid gland. No follow-up imaging is recommended. Remainder of the thyroid is unremarkable. There is no axillary or intrathoracic adenopathy. There is minimal residual thymus in the substernal mediastinum. The thoracic trachea and thoracic esophagus are unremarkable. Lungs/Pleura: Eventration and mild elevation right hemidiaphragm. There are mild posterior atelectatic changes in the lungs. No infiltrate or nodule is seen. No significant bronchial thickening and no bronchiectasis. There is no pleural effusion, thickening or pneumothorax. Musculoskeletal: No chest wall mass or suspicious bone lesions identified. CT ABDOMEN PELVIS FINDINGS Hepatobiliary: No focal liver abnormality is seen. No calcified gallstones, gallbladder wall thickening, or biliary dilatation. Pancreas: Unremarkable. No pancreatic ductal dilatation or surrounding inflammatory changes. Spleen: Normal in size without focal abnormality. Adrenals/Urinary Tract: Adrenal glands are unremarkable. Kidneys are normal, without renal calculi, focal lesion, or hydronephrosis. Bladder is unremarkable. Stomach/Bowel: No dilatation or wall thickening, including the appendix. Scattered uncomplicated sigmoid diverticulosis. Vascular/Lymphatic: There is no portal, splenic or SMV thrombosis. The IVC and pelvic deep veins show filling defects. The major proximal thigh veins are patent. No significant vascular findings are present. No enlarged abdominal or pelvic lymph nodes. Reproductive: The uterus is anteverted. There is interval enlargement of coalescent fundal heterogeneous masses compared with 2017, with a  conglomerate fundal mass now measuring 10.8 by 7.8 by 8.4 cm, about 1/4 this size previously. This is most likely due to enlarging fibroid disease. Leiomyosarcoma could appear similar but is very rare. The ovaries are not enlarged. Other: There is no free air, free hemorrhage, free fluid or incarcerated hernias. There is a small umbilical fat hernia. Musculoskeletal: There is facet hypertrophy in the lower lumbar spine. No acute or significant osseous findings or destructive lesions. IMPRESSION: 1. No venous thrombosis is seen in the chest, abdomen or pelvis. 2. Minimal aortic atherosclerosis. 3. Slight cardiomegaly. 4. Uncomplicated sigmoid diverticulosis. 5. Small umbilical fat hernia. 6. Interval enlargement of coalescent fundal uterine masses most likely due to enlarging fibroid disease. Leiomyosarcoma could appear similar but is very rare. 7. Facet hypertrophy in the lower lumbar spine. Aortic Atherosclerosis (ICD10-I70.0). Electronically Signed   By: Telford Nab M.D.   On: 04/15/2022 03:53   Overnight EEG with video  Result Date: 04/14/2022 Lora Havens, MD     04/14/2022 12:29 PM Patient Name: Catherine Kane MRN: [id number] Epilepsy Attending: Lora Havens Referring Physician/Provider: Amie Portland, MD Duration: 04/14/2022 0037 to 1109 Patient history: 52 year old female with a history of migraines, essential hypertension, anxiety and a history of COVID with unknown vaccination history initially presents with headaches and ear pain for 2 days. In the emergency department, she was found to have a left parietal subarachnoid hemorrhage with dural venous sinus thrombosis. Had possible associated seizure with speech difficulty and twitching. EEG to evaluate for seizure. Level of alertness: Awake, asleep AEDs during EEG study: LEV Technical aspects: This EEG study was done with scalp electrodes positioned according to the 10-20 International system of electrode placement. Electrical activity  was reviewed with band pass filter of 1-'70Hz'$ , sensitivity of 7 uV/mm, display speed of 42m/sec with a '60Hz'$  notched filter applied as appropriate. EEG data were recorded continuously and digitally stored.  Video monitoring was available and reviewed  as appropriate. Description: The posterior dominant rhythm consists of 8-9 Hz activity of moderate voltage (25-35 uV) seen predominantly in posterior head regions, symmetric and reactive to eye opening and eye closing. Sleep was characterized by vertex waves, sleep spindles (12 to 14 Hz), maximal frontocentral region. Hyperventilation and photic stimulation were not performed.   IMPRESSION: This study is within normal limits. No seizures or epileptiform discharges were seen throughout the recording. Priyanka Barbra Sarks   CT HEAD WO CONTRAST (5MM)  Result Date: 04/14/2022 CLINICAL DATA:  52 year old female presenting with headache and fever. Dural venous sinus thrombosis, trace left parietal lobe subarachnoid hemorrhage. EXAM: CT HEAD WITHOUT CONTRAST TECHNIQUE: Contiguous axial images were obtained from the base of the skull through the vertex without intravenous contrast. RADIATION DOSE REDUCTION: This exam was performed according to the departmental dose-optimization program which includes automated exposure control, adjustment of the mA and/or kV according to patient size and/or use of iterative reconstruction technique. COMPARISON:  CTA, CT V, MRI brain yesterday. FINDINGS: Brain: Small volume subarachnoid hemorrhage or less likely cortical vein thrombosis in the left parietal lobe series 3, image 21, same area affected on plain CT yesterday. Regional cortical edema is slightly more apparent by CT, but extent appears stable since the MRI yesterday. No intracranial midline shift or significant intracranial mass effect. No ventriculomegaly. No new intracranial hemorrhage identified. No other convincing cerebral edema. Chronic partially empty sella. Basilar cisterns  remain patent. Vascular: Ongoing hyperdense thrombus in the superior sagittal sinus extending to the torcula. Right transverse and sigmoid sinus thrombus better demonstrated on the contrast enhanced exams yesterday. No intracranial arterial hyperdensity is identified. Skull: No acute osseous abnormality identified. Sinuses/Orbits: Visualized paranasal sinuses and mastoids are stable and well aerated. Other: EKG leads in place. No acute orbit or scalp soft tissue finding. IMPRESSION: 1. 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 Probable venous infarct left parietal lobe with trace subarachnoid blood versus cortical vein thrombus is stable. No malignant hemorrhagic transformation. No intracranial mass effect at this time. 2. No new intracranial abnormality identified. Electronically Signed   By: Genevie Ann M.D.   On: 04/14/2022 04:42   CT ANGIO HEAD NECK W WO CM  Result Date: 04/13/2022 CLINICAL DATA:  Dural venous sinus thrombosis suspected EXAM: CT ANGIOGRAPHY HEAD AND NECK CT VENOGRAM HEAD TECHNIQUE: Multidetector CT imaging of the head and neck was performed using the standard protocol during bolus administration of intravenous contrast. Multiplanar CT image reconstructions and MIPs were obtained to evaluate the vascular anatomy. Carotid stenosis measurements (when applicable) are obtained utilizing NASCET criteria, using the distal internal carotid diameter as the denominator. Venographic phase images of the brain were obtained following the administration of intravenous contrast. Multiplanar reformats and maximum intensity projections were generated. RADIATION DOSE REDUCTION: This exam was performed according to the departmental dose-optimization program which includes automated exposure control, adjustment of the mA and/or kV according to patient size and/or use of iterative reconstruction technique. CONTRAST:  57m OMNIPAQUE IOHEXOL 350 MG/ML SOLN COMPARISON:  CT head and  MRI head 04/13/2022 FINDINGS: CT HEAD FINDINGS For noncontrast findings, please see same day CT head. CTA NECK FINDINGS Aortic arch: Standard branching. Imaged portion shows no evidence of aneurysm or dissection. No significant stenosis of the major arch vessel origins. Right carotid system: No evidence of stenosis, dissection, or occlusion. Left carotid system: No evidence of stenosis, dissection, or occlusion. Retropharyngeal course of the distal CCA. Vertebral arteries: No evidence of stenosis, dissection, or occlusion. Skeleton: No  acute osseous abnormality. Degenerative changes in the cervical spine. Poor dentition. Other neck: 7 mm partially calcified nodule in the left thyroid lobe, for which no follow-up is indicated. (Reference: J Am Coll Radiol. 2015 Feb;12(2): 143-50) Upper chest: No focal pulmonary opacity or pleural effusion. Review of the MIP images confirms the above findings CTA HEAD FINDINGS Anterior circulation: Both internal carotid arteries are patent to the termini, without significant stenosis. A1 segments patent. Normal anterior communicating artery. Anterior cerebral arteries are patent to their distal aspects. No M1 stenosis or occlusion. MCA branches perfused and symmetric. Posterior circulation: Vertebral arteries patent to the vertebrobasilar junction without stenosis. Posterior inferior cerebellar arteries patent proximally. Basilar patent to its distal aspect. Superior cerebellar arteries patent proximally. Patent P1 segments, hypoplastic on the right. Near fetal origin of the right PCA from the right posterior communicating artery. The left posterior communicating artery is also patent. PCAs perfused to their distal aspects without stenosis. Anatomic variants: Near fetal origin of the right PCA. Review of the MIP images confirms the above findings CTV FINDINGS Superior sagittal sinus: The anterior portion of severe sagittal sinus is patent (CTA series 7, image 160), with near occlusive  thrombus extending from the vertex superior sagittal sinus sinus to the torcula. Straight sinus: Patent. Inferior sagittal sinus, vein of Galen and internal cerebral veins: The inferior sagittal sinus is somewhat diminutive but appears patent. Vein of Galen and internal cerebral veins appear patent. Transverse sinuses: Occlusive thrombus on the right. The left transverse sinus is non dominant and diminutive, likely patent. Sigmoid sinuses: Occlusive thrombus on the right. The left sigmoid sinus is non dominant and diminutive but likely patent. Visualized jugular veins: Occluded proximally on the right. Patent on the left. Evaluation of the cavernous sinuses is somewhat limited, but there appears to be diminished and peripheral opacification, left greater than right, although the superior ophthalmic veins do not appear particularly enlarged IMPRESSION: 1. Occlusive thrombus in the right transverse and sigmoid sinuses, with near occlusive thrombus extending from the vertex superior sagittal sinus to the torcula. 2. The anterior portion of the superior sagittal sinus is patent. The inferior sagittal sinus is somewhat diminutive but appears patent. 3. No hemodynamically significant stenosis in the neck. 4. No intracranial large vessel occlusion or significant stenosis. Imaging results were communicated on 04/13/2022 at 9:57 pm to provider Dr. Rory Percy via secure text paging. Electronically Signed   By: Merilyn Baba M.D.   On: 04/13/2022 21:59   CT VENOGRAM HEAD  Result Date: 04/13/2022 CLINICAL DATA:  Dural venous sinus thrombosis suspected EXAM: CT ANGIOGRAPHY HEAD AND NECK CT VENOGRAM HEAD TECHNIQUE: Multidetector CT imaging of the head and neck was performed using the standard protocol during bolus administration of intravenous contrast. Multiplanar CT image reconstructions and MIPs were obtained to evaluate the vascular anatomy. Carotid stenosis measurements (when applicable) are obtained utilizing NASCET criteria,  using the distal internal carotid diameter as the denominator. Venographic phase images of the brain were obtained following the administration of intravenous contrast. Multiplanar reformats and maximum intensity projections were generated. RADIATION DOSE REDUCTION: This exam was performed according to the departmental dose-optimization program which includes automated exposure control, adjustment of the mA and/or kV according to patient size and/or use of iterative reconstruction technique. CONTRAST:  75m OMNIPAQUE IOHEXOL 350 MG/ML SOLN COMPARISON:  CT head and MRI head 04/13/2022 FINDINGS: CT HEAD FINDINGS For noncontrast findings, please see same day CT head. CTA NECK FINDINGS Aortic arch: Standard branching. Imaged portion shows no evidence of aneurysm  or dissection. No significant stenosis of the major arch vessel origins. Right carotid system: No evidence of stenosis, dissection, or occlusion. Left carotid system: No evidence of stenosis, dissection, or occlusion. Retropharyngeal course of the distal CCA. Vertebral arteries: No evidence of stenosis, dissection, or occlusion. Skeleton: No acute osseous abnormality. Degenerative changes in the cervical spine. Poor dentition. Other neck: 7 mm partially calcified nodule in the left thyroid lobe, for which no follow-up is indicated. (Reference: J Am Coll Radiol. 2015 Feb;12(2): 143-50) Upper chest: No focal pulmonary opacity or pleural effusion. Review of the MIP images confirms the above findings CTA HEAD FINDINGS Anterior circulation: Both internal carotid arteries are patent to the termini, without significant stenosis. A1 segments patent. Normal anterior communicating artery. Anterior cerebral arteries are patent to their distal aspects. No M1 stenosis or occlusion. MCA branches perfused and symmetric. Posterior circulation: Vertebral arteries patent to the vertebrobasilar junction without stenosis. Posterior inferior cerebellar arteries patent proximally.  Basilar patent to its distal aspect. Superior cerebellar arteries patent proximally. Patent P1 segments, hypoplastic on the right. Near fetal origin of the right PCA from the right posterior communicating artery. The left posterior communicating artery is also patent. PCAs perfused to their distal aspects without stenosis. Anatomic variants: Near fetal origin of the right PCA. Review of the MIP images confirms the above findings CTV FINDINGS Superior sagittal sinus: The anterior portion of severe sagittal sinus is patent (CTA series 7, image 160), with near occlusive thrombus extending from the vertex superior sagittal sinus sinus to the torcula. Straight sinus: Patent. Inferior sagittal sinus, vein of Galen and internal cerebral veins: The inferior sagittal sinus is somewhat diminutive but appears patent. Vein of Galen and internal cerebral veins appear patent. Transverse sinuses: Occlusive thrombus on the right. The left transverse sinus is non dominant and diminutive, likely patent. Sigmoid sinuses: Occlusive thrombus on the right. The left sigmoid sinus is non dominant and diminutive but likely patent. Visualized jugular veins: Occluded proximally on the right. Patent on the left. Evaluation of the cavernous sinuses is somewhat limited, but there appears to be diminished and peripheral opacification, left greater than right, although the superior ophthalmic veins do not appear particularly enlarged IMPRESSION: 1. Occlusive thrombus in the right transverse and sigmoid sinuses, with near occlusive thrombus extending from the vertex superior sagittal sinus to the torcula. 2. The anterior portion of the superior sagittal sinus is patent. The inferior sagittal sinus is somewhat diminutive but appears patent. 3. No hemodynamically significant stenosis in the neck. 4. No intracranial large vessel occlusion or significant stenosis. Imaging results were communicated on 04/13/2022 at 9:57 pm to provider Dr. Rory Percy via secure  text paging. Electronically Signed   By: Merilyn Baba M.D.   On: 04/13/2022 21:59   MR BRAIN WO CONTRAST  Result Date: 04/13/2022 CLINICAL DATA:  Neuro deficit with acute stroke suspected EXAM: MRI HEAD WITHOUT CONTRAST TECHNIQUE: Multiplanar, multiecho pulse sequences of the brain and surrounding structures were obtained without intravenous contrast. COMPARISON:  Head CT and CTA from earlier today FINDINGS: Brain: Some restricted diffusion in the left parietal and posterior frontal cortex where there is subarachnoid hemorrhage by CT. Dural venous sinus thrombosis is present in the superior sagittal and right transverse/sigmoid sinuses. Possible low-grade restricted diffusion in the medial left thalamus. No hydrocephalus, collection, or masslike finding. Partially empty sella. Vascular: Dural venous sinus thrombosis. There is pending CT venogram. Skull and upper cervical spine: Low marrow signal, usually physiologic from anemia. Sinuses/Orbits: Partial right mastoid opacification, often reactive  to anemia. These results were called by telephone at the time of interpretation on 04/13/2022 at 9:25 pm to provider [facility] , who verbally acknowledged these results. IMPRESSION: 1. 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 2. Questionable signal abnormality in the medial left thalamus which may be related to seizure. Electronically Signed   By: Jorje Guild M.D.   On: 04/13/2022 21:26   CT HEAD CODE STROKE WO CONTRAST  Result Date: 04/13/2022 CLINICAL DATA:  Code stroke.  Stroke suspected EXAM: CT HEAD WITHOUT CONTRAST TECHNIQUE: Contiguous axial images were obtained from the base of the skull through the vertex without intravenous contrast. RADIATION DOSE REDUCTION: This exam was performed according to the departmental dose-optimization program which includes  automated exposure control, adjustment of the mA and/or kV according to patient size and/or use of iterative reconstruction technique. COMPARISON:  CT Head 04/11/22 FINDINGS: Brain: There is subarachnoid hemorrhage along the left parietal lobe. No CT evidence of an acute infarct. No hydrocephalus. No mass effect. Vascular: There is diffusely hyperdense appearance of the dural venous sinuses, image raises the possibility for thrombosis, this is best seen along the right transverse sinus and the superior sagittal sinus. Skull: Normal. Negative for fracture or focal lesion. Sinuses/Orbits: No middle ear or mastoid effusion. Paranasal sinuses are clear. Orbits are unremarkable. Other: None. ASPECTS ([place] Stroke Program Early CT Score):10 IMPRESSION: 1. Subarachnoid hemorrhage in the left parietal lobe without evidence of intraventricular extension or hydrocephalus. 2. Diffusely hyperdense appearance of the dural venous sinuses raises the possibility for dural venous sinus thrombosis. Recommend CTV or mRV for further evaluation. 3. No CT evidence of an acute infarct. Findings were paged to Dr. Rory Percy at 8:50 PM on 04/13/22 Electronically Signed   By: Marin Roberts M.D.   On: 04/13/2022 20:53   CT Head Wo Contrast  Result Date: 04/11/2022 CLINICAL DATA:  Headache, fever EXAM: CT HEAD WITHOUT CONTRAST TECHNIQUE: Contiguous axial images were obtained from the base of the skull through the vertex without intravenous contrast. RADIATION DOSE REDUCTION: This exam was performed according to the departmental dose-optimization program which includes automated exposure control, adjustment of the mA and/or kV according to patient size and/or use of iterative reconstruction technique. COMPARISON:  None Available. FINDINGS: Brain: No evidence of acute infarction, hemorrhage, hydrocephalus, extra-axial collection or mass lesion/mass effect. Vascular: No hyperdense vessel or unexpected calcification. Skull: Normal. Negative for  fracture or focal lesion. Sinuses/Orbits: No acute finding. IMPRESSION: No acute intracranial process. Electronically Signed   By: Sammie Bench M.D.   On: 04/11/2022 08:01   (Echo, Carotid, EGD, Colonoscopy, ERCP)    Subjective: Patient seen in the morning rounds.  Waiting for rehab.  With her improved mobility, now plan to go home.  She will need a walker.  Discussed about fall precautions all the time.   Discharge Exam: Vitals:   04/19/22 1259 04/19/22 1556  BP: 130/82 124/80  Pulse: 61 70  Resp: 16 18  Temp: 98.3 F (36.8 C) 97.9 F (36.6 C)  SpO2: 100% 100%   Vitals:   04/19/22 0334 04/19/22 0850 04/19/22 1259 04/19/22 1556  BP: 119/78 127/75 130/82 124/80  Pulse: 68 64 61 70  Resp: '16 18 16 18  '$ Temp: 99 F (37.2 C) 98.1 F (36.7 C) 98.3 F (36.8 C) 97.9 F (36.6 C)  TempSrc: Oral Oral Oral Oral  SpO2: 99% 98% 100% 100%  Weight:  Height:        General: Pt is alert, awake, not in acute distress Cardiovascular: RRR, S1/S2 +, no rubs, no gallops Respiratory: CTA bilaterally, no wheezing, no rhonchi Abdominal: Soft, NT, ND, bowel sounds + Extremities: no edema, no cyanosis    The results of significant diagnostics from this hospitalization (including imaging, microbiology, ancillary and laboratory) are listed below for reference.     Microbiology: Recent Results (from the past 240 hour(s))  Resp panel by RT-PCR (RSV, Flu A&B, Covid) Anterior Nasal Swab     Status: None   Collection Time: 04/12/22  1:31 PM   Specimen: Anterior Nasal Swab  Result Value Ref Range Status   SARS Coronavirus 2 by RT PCR NEGATIVE NEGATIVE Final   Influenza A by PCR NEGATIVE NEGATIVE Final   Influenza B by PCR NEGATIVE NEGATIVE Final    Comment: (NOTE) The Xpert Xpress SARS-CoV-2/FLU/RSV plus assay is intended as an aid in the diagnosis of influenza from Nasopharyngeal swab specimens and should not be used as a sole basis for treatment. Nasal washings and aspirates are  unacceptable for Xpert Xpress SARS-CoV-2/FLU/RSV testing.  Fact Sheet for Patients: EntrepreneurPulse.com.au  Fact Sheet for Healthcare Providers: IncredibleEmployment.be  This test is not yet approved or cleared by the [place] FDA and has been authorized for detection and/or diagnosis of SARS-CoV-2 by FDA under an Emergency Use Authorization (EUA). This EUA will remain in effect (meaning this test can be used) for the duration of the COVID-19 declaration under Section 564(b)(1) of the Act, 21 U.S.C. section 360bbb-3(b)(1), unless the authorization is terminated or revoked.     Resp Syncytial Virus by PCR NEGATIVE NEGATIVE Final    Comment: (NOTE) Fact Sheet for Patients: EntrepreneurPulse.com.au  Fact Sheet for Healthcare Providers: IncredibleEmployment.be  This test is not yet approved or cleared by the [place] FDA and has been authorized for detection and/or diagnosis of SARS-CoV-2 by FDA under an Emergency Use Authorization (EUA). This EUA will remain in effect (meaning this test can be used) for the duration of the COVID-19 declaration under Section 564(b)(1) of the Act, 21 U.S.C. section 360bbb-3(b)(1), unless the authorization is terminated or revoked.  Performed at [facility], [place] [address], [place], [place] [address]   Surgical PCR screen     Status: None   Collection Time: 04/14/22 12:09 AM   Specimen: Nasal Mucosa; Nasal Swab  Result Value Ref Range Status   MRSA, PCR NEGATIVE NEGATIVE Final   Staphylococcus aureus NEGATIVE NEGATIVE Final    Comment: (NOTE) The Xpert SA Assay (FDA approved for NASAL specimens in patients 74 years of age and older), is one component of a comprehensive surveillance program. It is not intended to diagnose infection nor to guide or monitor treatment. Performed at [facility], [place] [address] [place], [place] [address]   Culture,  blood (Routine X 2) w Reflex to ID Panel     Status: None (Preliminary result)   Collection Time: 04/15/22 11:12 AM   Specimen: BLOOD LEFT HAND  Result Value Ref Range Status   Specimen Description BLOOD LEFT HAND  Final   Special Requests   Final    BOTTLES DRAWN AEROBIC AND ANAEROBIC Blood Culture adequate volume   Culture   Final    NO GROWTH 4 DAYS Performed at [facility], [place] [address], [place], [place] [address]    Report Status PENDING  Incomplete  Culture, blood (Routine X 2) w Reflex to ID Panel  Status: None (Preliminary result)   Collection Time: 04/15/22 11:12 AM   Specimen: BLOOD LEFT HAND  Result Value Ref Range Status   Specimen Description BLOOD LEFT HAND  Final   Special Requests   Final    BOTTLES DRAWN AEROBIC AND ANAEROBIC Blood Culture adequate volume   Culture   Final    NO GROWTH 4 DAYS Performed at [facility], [address] [place], [place] [address]    Report Status PENDING  Incomplete     Labs: BNP (last 3 results) No results for input(s): "BNP" in the last 8760 hours. Basic Metabolic Panel: Recent Labs  Lab 04/14/22 0442 04/15/22 0513 04/16/22 0721 04/17/22 0448 04/18/22 0248 04/19/22 0258  NA 137 134* 135 135 136 134*  K 3.4* 3.1* 3.6 3.6 3.3* 4.2  CL 102 103 102 101 110 104  CO2 20* '24 22 25 '$ 20* 22  GLUCOSE 79 99 81 89 74 94  BUN 5* 5* '6 10 6 7  '$ CREATININE 0.76 0.80 0.82 0.86 0.64 0.88  CALCIUM 9.8 8.9 8.9 9.0 7.4* 8.9  MG 1.8 1.9 1.9 1.9 1.6* 2.1  PHOS 3.4 2.8 2.8 2.8 2.9  --    Liver Function Tests: Recent Labs  Lab 04/13/22 2035 04/15/22 0513  AST 27 20  ALT 24 21  ALKPHOS 84 73  BILITOT 0.5 0.6  PROT 8.2* 7.1  ALBUMIN 4.1 3.4*   No results for input(s): "LIPASE", "AMYLASE" in the last 168 hours. No results for input(s): "AMMONIA" in the last 168 hours. CBC: Recent Labs  Lab 04/14/22 0442 04/15/22 0513 04/16/22 0721 04/17/22 0448 04/18/22 0248  WBC 8.4 7.1 6.8 5.7 5.4  NEUTROABS 5.6 3.6 2.4  2.3 2.4  HGB 16.6* 13.3 13.7 14.2 13.8  HCT 48.4* 40.5 42.1 44.1 41.0  MCV 85.4 86.4 86.4 87.0 86.1  PLT 313 298 262 314 280   Cardiac Enzymes: Recent Labs  Lab 04/15/22 0513  CKTOTAL 160  CKMB 1.4   BNP: Invalid input(s): "POCBNP" CBG: Recent Labs  Lab 04/13/22 2027  GLUCAP 101*   D-Dimer No results for input(s): "DDIMER" in the last 72 hours. Hgb A1c No results for input(s): "HGBA1C" in the last 72 hours. Lipid Profile No results for input(s): "CHOL", "HDL", "LDLCALC", "TRIG", "CHOLHDL", "LDLDIRECT" in the last 72 hours. Thyroid function studies No results for input(s): "TSH", "T4TOTAL", "T3FREE", "THYROIDAB" in the last 72 hours.  Invalid input(s): "FREET3" Anemia work up No results for input(s): "VITAMINB12", "FOLATE", "FERRITIN", "TIBC", "IRON", "RETICCTPCT" in the last 72 hours. Urinalysis    Component Value Date/Time   COLORURINE STRAW (A) 04/13/2022 2221   APPEARANCEUR CLEAR 04/13/2022 2221   LABSPEC 1.025 04/13/2022 2221   PHURINE 7.0 04/13/2022 2221   GLUCOSEU NEGATIVE 04/13/2022 2221   HGBUR NEGATIVE 04/13/2022 2221   BILIRUBINUR NEGATIVE 04/13/2022 2221   BILIRUBINUR neg 09/08/2019 1516   KETONESUR NEGATIVE 04/13/2022 2221   PROTEINUR NEGATIVE 04/13/2022 2221   UROBILINOGEN 0.2 09/08/2019 1516   UROBILINOGEN 0.2 04/16/2017 0808   NITRITE NEGATIVE 04/13/2022 2221   LEUKOCYTESUR NEGATIVE 04/13/2022 2221   Sepsis Labs Recent Labs  Lab 04/15/22 0513 04/16/22 0721 04/17/22 0448 04/18/22 0248  WBC 7.1 6.8 5.7 5.4   Microbiology Recent Results (from the past 240 hour(s))  Resp panel by RT-PCR (RSV, Flu A&B, Covid) Anterior Nasal Swab     Status: None   Collection Time: 04/12/22  1:31 PM   Specimen: Anterior Nasal Swab  Result Value Ref Range Status   SARS  Coronavirus 2 by RT PCR NEGATIVE NEGATIVE Final   Influenza A by PCR NEGATIVE NEGATIVE Final   Influenza B by PCR NEGATIVE NEGATIVE Final    Comment: (NOTE) The Xpert Xpress  SARS-CoV-2/FLU/RSV plus assay is intended as an aid in the diagnosis of influenza from Nasopharyngeal swab specimens and should not be used as a sole basis for treatment. Nasal washings and aspirates are unacceptable for Xpert Xpress SARS-CoV-2/FLU/RSV testing.  Fact Sheet for Patients: EntrepreneurPulse.com.au  Fact Sheet for Healthcare Providers: IncredibleEmployment.be  This test is not yet approved or cleared by the [place] FDA and has been authorized for detection and/or diagnosis of SARS-CoV-2 by FDA under an Emergency Use Authorization (EUA). This EUA will remain in effect (meaning this test can be used) for the duration of the COVID-19 declaration under Section 564(b)(1) of the Act, 21 U.S.C. section 360bbb-3(b)(1), unless the authorization is terminated or revoked.     Resp Syncytial Virus by PCR NEGATIVE NEGATIVE Final    Comment: (NOTE) Fact Sheet for Patients: EntrepreneurPulse.com.au  Fact Sheet for Healthcare Providers: IncredibleEmployment.be  This test is not yet approved or cleared by the [place] FDA and has been authorized for detection and/or diagnosis of SARS-CoV-2 by FDA under an Emergency Use Authorization (EUA). This EUA will remain in effect (meaning this test can be used) for the duration of the COVID-19 declaration under Section 564(b)(1) of the Act, 21 U.S.C. section 360bbb-3(b)(1), unless the authorization is terminated or revoked.  Performed at [facility], [place] [address] [place], [place] [address]   Surgical PCR screen     Status: None   Collection Time: 04/14/22 12:09 AM   Specimen: Nasal Mucosa; Nasal Swab  Result Value Ref Range Status   MRSA, PCR NEGATIVE NEGATIVE Final   Staphylococcus aureus NEGATIVE NEGATIVE Final    Comment: (NOTE) The Xpert SA Assay (FDA approved for NASAL specimens in patients 24 years of age and older), is one component of  a comprehensive surveillance program. It is not intended to diagnose infection nor to guide or monitor treatment. Performed at [facility], [place] [address] [place], [place] [address]   Culture, blood (Routine X 2) w Reflex to ID Panel     Status: None (Preliminary result)   Collection Time: 04/15/22 11:12 AM   Specimen: BLOOD LEFT HAND  Result Value Ref Range Status   Specimen Description BLOOD LEFT HAND  Final   Special Requests   Final    BOTTLES DRAWN AEROBIC AND ANAEROBIC Blood Culture adequate volume   Culture   Final    NO GROWTH 4 DAYS Performed at [facility], [place] [address], [place], [place] [address]    Report Status PENDING  Incomplete  Culture, blood (Routine X 2) w Reflex to ID Panel     Status: None (Preliminary result)   Collection Time: 04/15/22 11:12 AM   Specimen: BLOOD LEFT HAND  Result Value Ref Range Status   Specimen Description BLOOD LEFT HAND  Final   Special Requests   Final    BOTTLES DRAWN AEROBIC AND ANAEROBIC Blood Culture adequate volume   Culture   Final    NO GROWTH 4 DAYS Performed at [facility], [place] [address], [place], [place] [address]    Report Status PENDING  Incomplete     Time coordinating discharge:  35 minutes  SIGNED:   Barb Merino, MD  [facility] 04/19/2022, 4:23 PM

## 2022-04-19 NOTE — TOC Transition Note (Signed)
Transition of Care [facility]) - CM/SW Discharge Note   Patient Details  Name: Catherine Kane MRN: [id number] Date of Birth: 09-29-1970  [facility]) CM/SW Contact:  Pollie Friar, RN Phone Number: 04/19/2022, 4:19 PM   Clinical Narrative:    Pt is from home with her finance. They are together except when he is at work: M-F 4am-2:30 pm.  Current recommendations are for home health. CM is unable to get her [place] with medicaid. No HH agency has accepted. She and her fiance are good with outpatient therapy. She prefers to attend outpatient at [facility]. Orders in Epic and information on the AVS. Pt will need to call and schedule the first appointment. Pt is aware.  Therapies recommended walker and tub bench. CM has ordered these through [facility] and they will be delivered to the patients room.  Pt manges her own medications and denies any issues. They use the [place]. Today her d/c medications will be delivered to the room per [place]. Pt inquired about disability. CM has sent email to financial counseling to see if she will qualify.  Pts fiance to provide transportation home.    Final next level of care: OP Rehab Barriers to Discharge: No Barriers Identified   Patient Goals and CMS Choice   Choice offered to / list presented to : Patient  Discharge Placement                         Discharge Plan and Services Additional resources added to the After Visit Summary for                  DME Arranged: Walker rolling, Tub bench DME Agency: [employer] Date DME Agency Contacted: 04/19/22   Representative spoke with at DME Agency: [facility] ([place]) Interventions SDOH Screenings   Food Insecurity: No Food Insecurity (04/17/2022)  Housing: High Risk (04/17/2022)  Transportation Needs: No Transportation Needs (04/17/2022)  Utilities: At Risk (04/17/2022)  Depression (PHQ2-9): Low  Risk  (09/30/2018)  Tobacco Use: Low Risk  (04/13/2022)     Readmission Risk Interventions     No data to display

## 2022-04-19 NOTE — Progress Notes (Signed)
Physical Therapy Treatment Patient Details Name: Catherine Kane MRN: [id number] DOB: 01-21-1971 Today's Date: 04/19/2022   History of Present Illness 52 y/o female who presents on 3/7 with headache, speech difficulty, right sided numbness. 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 Questionable signal abnormality in the medial left thalamus which may be related to seizure. + cocaine. PMH includes migraines, essential hypertension, anxiety and a history of COVID.    PT Comments    Pt is continuing to demonstrate great progress with her functional mobility independence and safety. Pt was able to quickly progress away from using the RW to no UE support when ambulating today. She does tend to slow her gait speed and take smaller steps when her dynamic gait balance is challenged, but appears to have good awareness of her deficits and how to accommodate for challenges to ensure her safety. As pt has progressed well and has good safety awareness, updated d/c recs to home with OPPT follow-up. Pt seems to be able to mobilize on her own for periods of time and if she does not feel safe due to dizziness she seems aware enough to not get up without assistance. Pt and her significant other are in agreement with changing d/c recs from AIR to home. Educated pt and her significant other on "BE FAST", use of DME and guarding pt when not using an AD, and safe mobility. Will continue to follow acutely.      Recommendations for follow up therapy are one component of a multi-disciplinary discharge planning process, led by the attending physician.  Recommendations may be updated based on patient status, additional functional criteria and insurance authorization.  Follow Up Recommendations  Outpatient PT     Assistance Recommended at Discharge Intermittent Supervision/Assistance   Patient can return home with the following A little help with walking and/or transfers;A little help with bathing/dressing/bathroom;Help with stairs or ramp for entrance;Assistance with cooking/housework;Assist for transportation   Equipment Recommendations  Rolling walker (2 wheels)    Recommendations for Other Services       Precautions / Restrictions Precautions Precautions: Fall Precaution Comments: . Restrictions Weight Bearing Restrictions: No     Mobility  Bed Mobility Overal bed mobility: Modified Independent Bed Mobility: Supine to Sit, Sit to Supine     Supine to sit: Modified independent (Device/Increase time), HOB elevated Sit to supine: Modified independent (Device/Increase time), HOB elevated   General bed mobility comments: HOB elevated, cued pt to slow transition as needed to reduce dizziness. No assistance needed    Transfers Overall transfer level: Needs assistance Equipment used: Rolling walker (2 wheels) Transfers: Sit to/from Stand Sit to Stand: Supervision           General transfer comment: Supervision for safety, no LOB    Ambulation/Gait Ambulation/Gait assistance: Min guard, Supervision Gait Distance (Feet): 320 Feet Assistive device: Rolling walker (2 wheels), None Gait Pattern/deviations: Step-through pattern, Decreased stride length Gait velocity: decreased Gait velocity interpretation: <1.8 ft/sec, indicate of risk for recurrent falls   General Gait Details: Pt initially ambulating wiht RW, but quickly progressed to no AD. Pt holding her arms up in slight shoulder abduction initially when ambulating without an AD, but quickly progressed to arms more proximal to her sides and swinging more naturally with gait. Pt slows her gait and adjusts her steps when zig zagging between obstacles, turning 180 degrees, and ambulating while turning her head  L <> R and looking up <> down. Pt able to sustain her balance with these alterations, min  guard-supervision for safety. No cues needed to change her gait to ensure her safety, she adjusts it on her own.   Stairs             Wheelchair Mobility    Modified Rankin (Stroke Patients Only) Modified Rankin (Stroke Patients Only) Pre-Morbid Rankin Score: No symptoms Modified Rankin: Moderate disability     Balance Overall balance assessment: Needs assistance Sitting-balance support: Feet supported, No upper extremity supported Sitting balance-Leahy Scale: Good     Standing balance support: During functional activity, Bilateral upper extremity supported, No upper extremity supported Standing balance-Leahy Scale: Good Standing balance comment: Mild balance deficits when dynamic gait balance challenged, but pt alters her gait speed/pattern to ensure her safety and no LOB                            Cognition Arousal/Alertness: Awake/alert Behavior During Therapy: WFL for tasks assessed/performed Overall Cognitive Status: Within Functional Limits for tasks assessed                                 General Comments: pt reports some slow processing, but seems very aware of herself and her deficits, altering her mobility as needed to ensure her safety without needing cues        Exercises      General Comments General comments (skin integrity, edema, etc.): BP 139/101 sitting, 140/101 standing; educated pt and fiance for pt to use RW when alone and have someone guard her when ambulating without an AD, change positions slowly and fixate gaze on static object as needed to reduce dizziness, "BE FAST", and for pt to be monitoring herself on when it is or is not safe to get up on her own at home- they verbalized understanding of all education, pt reporting feeling comfortable discharging home instead      Pertinent Vitals/Pain Pain Assessment Pain Assessment: Faces Faces Pain Scale: Hurts a little bit Pain Location: head Pain Descriptors /  Indicators: Headache Pain Intervention(s): Limited activity within patient's tolerance, Monitored during session    Home Living                          Prior Function            PT Goals (current goals can now be found in the care plan section) Acute Rehab PT Goals Patient Stated Goal: to get better, return to PLOF PT Goal Formulation: With patient/family Time For Goal Achievement: 04/29/22 Potential to Achieve Goals: Good Progress towards PT goals: Progressing toward goals    Frequency    Min 4X/week      PT Plan Discharge plan needs to be updated    Co-evaluation              AM-PAC PT "6 Clicks" Mobility   Outcome Measure  Help needed turning from your back to your side while in a flat bed without using bedrails?: None Help needed moving from lying on your back to sitting on the side of a flat bed without using bedrails?: None Help needed moving to and from a bed to a chair (including a wheelchair)?: A Little Help needed standing up from a chair using your arms (e.g., wheelchair or bedside chair)?: A  Little Help needed to walk in hospital room?: A Little Help needed climbing 3-5 steps with a railing? : A Little 6 Click Score: 20    End of Session Equipment Utilized During Treatment: Gait belt Activity Tolerance: Patient tolerated treatment well Patient left: in bed;with call bell/phone within reach;with family/visitor present Nurse Communication: Mobility status PT Visit Diagnosis: Pain;Hemiplegia and hemiparesis;Muscle weakness (generalized) (M62.81);Unsteadiness on feet (R26.81);Difficulty in walking, not elsewhere classified (R26.2) Hemiplegia - Right/Left: Right Hemiplegia - dominant/non-dominant: Dominant Hemiplegia - caused by: Cerebral infarction Pain - part of body:  (head)     Time: [id number] PT Time Calculation (min) (ACUTE ONLY): 21 min  Charges:  $Gait Training: 8-22 mins                     Moishe Spice, PT, DPT Acute  Rehabilitation Services  Office: [place] 04/19/2022, 4:52 PM

## 2022-04-19 NOTE — Plan of Care (Signed)

## 2022-04-19 NOTE — Progress Notes (Signed)
Inpatient Rehabilitation Admissions Coordinator   I have insurance approval for CIR bed , but no bed available to admit her to today. I have alerted acute team that she may progress to be able to discharge home with outpatient therapy. I will follow her progress.  Danne Baxter, RN, MSN Rehab Admissions Coordinator [phone] 04/19/2022 10:33 AM

## 2022-04-19 NOTE — Progress Notes (Signed)
[facility] Progress Note Patient: Catherine Kane [id number] DOB: 1970/05/11 DOA: 04/13/2022  DOS: the patient was seen and examined on 04/19/2022  Brief hospital course: PMH of HTN, migraine, anxiety, asthma, basal deficiency, vitamin D deficiency, substance abuse.  To the hospital with complaints of right-sided weakness and headache. Originally presented on 04/11/2022 to ED with headache ongoing for 3 days with vomiting.  CT of the head was unremarkable.  Treated symptomatically with improvement in symptoms.  Was discharged on oral Zofran. Return on 3/07-2022 with complaints of right side of the face ear and neck hurting with right ear muffled feeling.  Had right TM erythema with bulging concerning for possible infection treated with Augmentin.  COVID flu and RSV was negative.  Omnicef was sent by PCP outpatient but patient left AMA. Return to ER on 3/7 due to persistent right-sided numbness.  Now admitted with a diagnosis of [place] as well as dural venous sinus thrombosis.  Initially in the ICU.  Neurology consulted.  Started on IV heparin.  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 Presents with recurrent headache likely going since the beginning of the March. Found to have occlusive thrombus in the right transverse and sigmoid sinuses as well as near occlusive thrombus extending from vertex superior sagittal sinus to torcula. CT angio negative for any large vessel occlusion. MRI brain shows venous infarct. 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 Neurology was consulted. Patient was started on IV heparin as the risk for progressive decline without anticoagulation outweighed the risk for worsening SAH per neurology.  Now on Eliquis.   Still has mild headache but resolution of photophobia. Continue Fioricet and tramadol as needed. Currently on Keppra for seizure prophylaxis for 7 days.  Last day 3/15.   Hypercoagulable state. Patient presents  with dural venous sinus thrombosis. Workup initiated for hypercoagulable state. So far workup is unremarkable and etiology is unclear. CT chest abdomen pelvis negative for any malignant site or any other source of thromboembolism. Reported to have some right ear infection although suspect that his swelling and erythema in the setting of venous sinus thrombosis. Patient is positive for cocaine but no other substance abuse reported. No other fever chills or any other infectious symptoms reported. No other hardware reported by the patient. No prior history of any inflammatory condition. COVID is negative.   Hypertensive emergency. Blood pressure was elevated. Currently on Norvasc,  Reportedly on carvedilol-currently on hold due to cocaine positivity as well as bradycardia. Also on Diovan and HCTZ.  Will initiate irbesartan. Monitor.   GERD. Continue PPI.   Hypokalemia. Currently being treated.   Mood disorder. On Prozac. Continue. On chlorphentermine.  Currently on hold.   HLD. On Lipitor 40 mg daily. Continue.   Substance abuse. Patient is cocaine positive. Counseled the patient to avoid any substance abuse in future.   Class 3 obesity Body mass index is 41.47 kg/m.  Placing the patient at high risk of poor outcome.  Hypokalemia. Hypomagnesemia. Replaced.   Severe B12 deficiency.  B12 levels 139. Patient received 3 days of B12 injection, will discharge with oral supplement.    Subjective: Patient seen and examined in the morning rounds.  Patient tells me that she is still has some headache otherwise no other significant issue.  She is getting around with a walker.  Mobility has somehow improved but nervous to walk.  Waiting for rehab.  Physical Exam:  Looks comfortable.  Data  reviewed.  Disposition: Status is: Inpatient  Remains inpatient appropriate because: Awaiting CIR placement. Medically stable.   SCDs Start: 04/13/22 2229 apixaban (ELIQUIS) tablet 5  mg   Family Communication: no family at bedside.  Level of care: Telemetry Medical . Can transfer to medsurg Vitals:   04/18/22 2326 04/19/22 0334 04/19/22 0850 04/19/22 1259  BP: (!) 124/98 119/78 127/75 130/82  Pulse: 70 68 64 61  Resp: '14 16 18 16  '$ Temp: 97.9 F (36.6 C) 99 F (37.2 C) 98.1 F (36.7 C) 98.3 F (36.8 C)  TempSrc: Oral Oral Oral Oral  SpO2: 100% 99% 98% 100%  Weight:      Height:         Author: Barb Merino, MD 04/19/2022 1:47 PM

## 2022-04-19 NOTE — TOC CAGE-AID Note (Signed)
Transition of Care [facility]) - CAGE-AID Screening   Patient Details  Name: Catherine Kane MRN: [id number] Date of Birth: 1970/03/08  Transition of Care [facility]) CM/SW Contact:    Pollie Friar, RN Phone Number: 04/19/2022, 4:25 PM   Clinical Narrative: Pt refused inpatient/ outpatient drug counseling resources. Pt states she is not sure how cocaine was in her system. She denies using cocaine.   CAGE-AID Screening:    Have You Ever Felt You Ought to Cut Down on Your Drinking or Drug Use?: No Have People Annoyed You By Critizing Your Drinking Or Drug Use?: No Have You Felt Bad Or Guilty About Your Drinking Or Drug Use?: No Have You Ever Had a Drink or Used Drugs First Thing In The Morning to Steady Your Nerves or to Get Rid of a Hangover?: No CAGE-AID Score: 0  Substance Abuse Education Offered: Yes (refused)

## 2022-04-20 LAB — CULTURE, BLOOD (ROUTINE X 2)
Culture: NO GROWTH
Culture: NO GROWTH
Special Requests: ADEQUATE
Special Requests: ADEQUATE

## 2022-04-20 LAB — VITAMIN B1: Vitamin B1 (Thiamine): 159 nmol/L (ref 66.5–200.0)

## 2022-04-24 ENCOUNTER — Ambulatory Visit: Payer: Self-pay

## 2022-04-24 ENCOUNTER — Telehealth: Payer: Self-pay | Admitting: Critical Care Medicine

## 2022-04-24 ENCOUNTER — Telehealth: Payer: Self-pay | Admitting: *Deleted

## 2022-04-24 ENCOUNTER — Other Ambulatory Visit: Payer: Self-pay | Admitting: Critical Care Medicine

## 2022-04-24 ENCOUNTER — Other Ambulatory Visit (HOSPITAL_COMMUNITY): Payer: Self-pay

## 2022-04-24 DIAGNOSIS — I63333 Cerebral infarction due to thrombosis of bilateral posterior cerebral arteries: Secondary | ICD-10-CM

## 2022-04-24 DIAGNOSIS — I609 Nontraumatic subarachnoid hemorrhage, unspecified: Secondary | ICD-10-CM

## 2022-04-24 DIAGNOSIS — R059 Cough, unspecified: Secondary | ICD-10-CM

## 2022-04-24 NOTE — Telephone Encounter (Signed)
Pt is calling to report that she had a stroke. Was hospitalized. Pt is requesting a hospital follow up. Pt has an appt on 05/09/22. Pt is watching her BP 130/90. And has daily headaches and dizziness.  [place]- [phone]

## 2022-04-24 NOTE — Telephone Encounter (Signed)
Medication Refill - Medication: butalbital-acetaminophen-caffeine (FIORICET) 50-325-40 MG tablet   Has the patient contacted their pharmacy? No.   Preferred Pharmacy (with phone number or street name):  [facility] Phone: [phone]  Fax: [phone]     Has the patient been seen for an appointment in the last year OR does the patient have an upcoming appointment? Yes.    The patient states she only has 2 pills left and was prescribed this at the hospital due to bleeding on the brain and blood clots caused by the stroke. Please assist patient as soon as possible as she still continues to have headaches like they told her she would. The patient is scheduled for an appt on April 2nd and on the wait list. Patient was discharged from [facility] on 3/13.

## 2022-04-24 NOTE — Telephone Encounter (Signed)
  Chief Complaint: Headache and dizziness Symptoms: above Frequency: 04/11/2022 Pertinent Negatives: Patient denies  Disposition: [x] ED /[] Urgent Care (no appt availability in office) / [] Appointment(In office/virtual)/ []  [facility]/ [] Home Care/ [] Refused Recommended Disposition /[] [facility]/ []  Follow-up with PCP Additional Notes: PT was recently in hospital for stroke. Pt continues to have HA. Pt was told that she will have HA, but was not told when they should resolve. Pt is also experiencing dizziness. Pt states that she was discharged while she was having these symptoms and is feeling uneasy about this. Pt has been taking Fioricet for HA.  PT will go to Ed.  Summary: Dizziness & headache advice   Pt is calling to report diziness if she sit up. Pt reports headaches and dizziness. 04/13/2022 - 04/19/2022 (6 days) [place] Stroke Please advise     Reason for Disposition  Patient sounds very sick or weak to the triager  Answer Assessment - Initial Assessment Questions 1. LOCATION: "Where does it hurt?"      HA 2. ONSET: "When did the headache start?" (Minutes, hours or days)      04/11/2022 3. PATTERN: "Does the pain come and go, or has it been constant since it started?"     constant 4. SEVERITY: "How bad is the pain?" and "What does it keep you from doing?"  (e.g., Scale 1-10; mild, moderate, or severe)   - MILD (1-3): doesn't interfere with normal activities    - MODERATE (4-7): interferes with normal activities or awakens from sleep    - SEVERE (8-10): excruciating pain, unable to do any normal activities        moderate 5. RECURRENT SYMPTOM: "Have you ever had headaches before?" If Yes, ask: "When was the last time?" and "What happened that time?"      Clot in brain 6. CAUSE: "What do you think is causing the headache?"     Stroke 7. MIGRAINE: "Have you been diagnosed with migraine headaches?" If Yes, ask: "Is this headache similar?"        8. HEAD INJURY: "Has there been any recent injury to the head?"       9. OTHER SYMPTOMS: "Do you have any other symptoms?" (fever, stiff neck, eye pain, sore throat, cold symptoms)     Dizziness  Protocols used: Headache-A-AH

## 2022-04-25 ENCOUNTER — Other Ambulatory Visit: Payer: Self-pay

## 2022-04-25 NOTE — Telephone Encounter (Signed)
Spoke patient patient did not go to ED as advised on yesterday. Voices that HA got better. Voices that she is going  keep monitor her BP/ Today Bp was 130/90. Voices that if HA gets worse She would got back to the ED. Advised patient to note time when s/s worsen and tell someone else if possible. Patient voices that she plans to keep appointment with Dr. Joya Gaskins on 05/09/2022. Patient voiced understanding of all discussed .

## 2022-04-25 NOTE — Telephone Encounter (Signed)
Change appt to next week  She should have gotten neurology appt post hosp  I do not see one  I will make referrla

## 2022-04-25 NOTE — Addendum Note (Signed)
Addended by: Asencion Noble E on: 04/25/2022 01:01 PM   Modules accepted: Orders

## 2022-04-25 NOTE — Telephone Encounter (Signed)
Requested medication (s) are due for refill today provider review   Requested medication (s) are on the active medication list -yes  Future visit scheduled -yes  Last refill: 04/19/22 #14  Notes to clinic: non delegated Rx, outside provider  Requested Prescriptions  Pending Prescriptions Disp Refills   butalbital-acetaminophen-caffeine (FIORICET) 50-325-40 MG tablet 14 tablet 0    Sig: Take 1 tablet by mouth every 4 (four) hours as needed for headache or migraine.     Not Delegated - Analgesics:  Non-Opioid Analgesic Combinations 2 Failed - 04/24/2022  1:44 PM      Failed - This refill cannot be delegated      Passed - Cr in normal range and within 360 days    Creatinine  Date Value Ref Range Status  09/16/2013 0.99 0.60 - 1.30 mg/dL Final   Creatinine, Ser  Date Value Ref Range Status  04/19/2022 0.88 0.44 - 1.00 mg/dL Final         Passed - eGFR is 10 or above and within 360 days    EGFR (African American)  Date Value Ref Range Status  09/16/2013 >60  Final   GFR calc Af Amer  Date Value Ref Range Status  09/15/2019 88 >59 mL/min/1.73 Final    Comment:    **[facility] currently reports eGFR in compliance with the current**   recommendations of the [employer]. [facility] will   update reporting as new guidelines are published from the NKF-ASN   Task force.    EGFR (Non-African Amer.)  Date Value Ref Range Status  09/16/2013 >60  Final    Comment:    eGFR values <53mL/min/1.73 m2 may be an indication of chronic kidney disease (CKD). Calculated eGFR is useful in patients with stable renal function. The eGFR calculation will not be reliable in acutely ill patients when serum creatinine is changing rapidly. It is not useful in  patients on dialysis. The eGFR calculation may not be applicable to patients at the low and high extremes of body sizes, pregnant women, and vegetarians.    GFR, Estimated  Date Value Ref Range Status  04/19/2022 >60 >60 mL/min  Final    Comment:    (NOTE) Calculated using the CKD-EPI Creatinine Equation (2021)    eGFR  Date Value Ref Range Status  11/01/2021 94 >59 mL/min/1.73 Final         Passed - Patient is not pregnant      Passed - Valid encounter within last 12 months    Recent Outpatient Visits           5 months ago Essential hypertension   [place], MD       Future Appointments             In 2 weeks Elsie Stain, MD [place]               Requested Prescriptions  Pending Prescriptions Disp Refills   butalbital-acetaminophen-caffeine (FIORICET) 50-325-40 MG tablet 14 tablet 0    Sig: Take 1 tablet by mouth every 4 (four) hours as needed for headache or migraine.     Not Delegated - Analgesics:  Non-Opioid Analgesic Combinations 2 Failed - 04/24/2022  1:44 PM      Failed - This refill cannot be delegated      Passed - Cr in normal range and within 360 days    Creatinine  Date Value Ref Range  Status  09/16/2013 0.99 0.60 - 1.30 mg/dL Final   Creatinine, Ser  Date Value Ref Range Status  04/19/2022 0.88 0.44 - 1.00 mg/dL Final         Passed - eGFR is 10 or above and within 360 days    EGFR (African American)  Date Value Ref Range Status  09/16/2013 >60  Final   GFR calc Af Amer  Date Value Ref Range Status  09/15/2019 88 >59 mL/min/1.73 Final    Comment:    **[facility] currently reports eGFR in compliance with the current**   recommendations of the [employer]. [facility] will   update reporting as new guidelines are published from the NKF-ASN   Task force.    EGFR (Non-African Amer.)  Date Value Ref Range Status  09/16/2013 >60  Final    Comment:    eGFR values <66mL/min/1.73 m2 may be an indication of chronic kidney disease (CKD). Calculated eGFR is useful in patients with stable renal function. The eGFR calculation will not be reliable in acutely  ill patients when serum creatinine is changing rapidly. It is not useful in  patients on dialysis. The eGFR calculation may not be applicable to patients at the low and high extremes of body sizes, pregnant women, and vegetarians.    GFR, Estimated  Date Value Ref Range Status  04/19/2022 >60 >60 mL/min Final    Comment:    (NOTE) Calculated using the CKD-EPI Creatinine Equation (2021)    eGFR  Date Value Ref Range Status  11/01/2021 94 >59 mL/min/1.73 Final         Passed - Patient is not pregnant      Passed - Valid encounter within last 12 months    Recent Outpatient Visits           5 months ago Essential hypertension   [place], MD       Future Appointments             In 2 weeks Elsie Stain, MD [place]

## 2022-04-25 NOTE — Telephone Encounter (Signed)
Please see notes for duplicate call.

## 2022-04-26 ENCOUNTER — Telehealth: Payer: Self-pay

## 2022-04-26 ENCOUNTER — Other Ambulatory Visit: Payer: Self-pay

## 2022-04-26 DIAGNOSIS — I609 Nontraumatic subarachnoid hemorrhage, unspecified: Secondary | ICD-10-CM

## 2022-04-26 LAB — PROTHROMBIN GENE MUTATION

## 2022-04-26 LAB — FACTOR 5 LEIDEN

## 2022-04-26 MED ORDER — BUTALBITAL-APAP-CAFFEINE 50-325-40 MG PO TABS
1.0000 | ORAL_TABLET | ORAL | 0 refills | Status: DC | PRN
  Filled 2022-04-26: qty 14, 3d supply, fill #0

## 2022-04-26 NOTE — Telephone Encounter (Signed)
From the discharge call:  She said she is feeling much better.  The headaches are no where near as bad as they were.  She said they tend to occur when she has been sitting up for while  She is interested in applying for disability.  She said she had not been able to work prior to this hospitalization due to other medical issues and now she does not feel that she would be able to work for months, possibly long term.  she is currently staying in a motel with her significant other, AJ and  he is paying  the fee for the motel.  I referred her to [facility] for assistance with submitting a disability application if she is eligible.  She was also in agreement to having me refer her to Managed Medicaid Care Management for assistance with accessing her  benefits.   She said all medications were sent to her accept for one which she is still waiting for,  I offered to review the medication list with her and she said she did not need to review.  Her significant other, Elberta Fortis, is providing assistance with ADLs as needed.   She said the tub bench she received is not going to work for her, it's too big.  She said she needs a tub seat.  I instructed her to call [place] and request an exchange for the tub bench., however,  I told her that we may need to submit a new order for the tub seat.  Follow-up Provider: Dr Joya Gaskins - 05/09/2202.

## 2022-04-26 NOTE — Telephone Encounter (Signed)
Referral noted. Patient is aware of the referral.

## 2022-04-26 NOTE — Transitions of Care (Post Inpatient/ED Visit) (Signed)
   04/26/2022  Name: Catherine Kane MRN: [id number] DOB: Jun 29, 1970  Today's TOC FU Call Status: Today's TOC FU Call Status:: Successful TOC FU Call Competed TOC FU Call Complete Date: 04/26/22  Transition Care Management Follow-up Telephone Call Date of Discharge: 04/19/22 Dis[facility]) Type of Discharge: Inpatient Admission Primary Inpatient Discharge Diagnosis:: stroke How have you been since you were released from the hospital?: Better (She said she is feeling much better.  The headaches are no where near as bad as they were.  She said they tend to occur when she has been sitting up for while.) Any questions or concerns?: Yes Patient Questions/Concerns:: She is interested in applying for disability.  She said she had not been able to work prior to this hospitalization due to other medical issues and now she does not feel that she would be able to work for months, possibly long term.  she is currently staying in a motel with her significant other, AJ and  he is paying  the fee for the motel. Patient Questions/Concerns Addressed: Notified Provider of Patient Questions/Concerns, Other: (referred her to [facility] for assistance with submitting a disability application if she is eligible.  She was also in agreement to having me refer her to Managed Medicaid Care Management for assistance with accessing her  benefits.)  Items Reviewed: Did you receive and understand the discharge instructions provided?: Yes Medications obtained and verified?: Yes (Medications Reviewed) (She said all medications were sent to her accept for one which she is still waiting for,  I offered to review the medication list with her and she said she did not need to review.) Any new allergies since your discharge?: No Dietary orders reviewed?: Yes Type of Diet Ordered:: low sodium, heart healthy Do you have support at home?: Yes People in Home: significant other Name of  Support/Comfort Primary Source: Anthony/AJ  Home Care and Equipment/Supplies: [place] Ordered?: No (She will be going to outpatient PT/OT starting 05/15/2022.) Any new equipment or medical supplies ordered?: Yes Name of Medical supply agency?: [place] Were you able to get the equipment/medical supplies?: Yes (She received a RW and a tub bench.) Do you have any questions related to the use of the equipment/supplies?: Yes What questions do you have?: She said the tub bench is not going to work for her, it's too big.  She said she needs a tub seat.  I instructed her to call [facility] and request an exchange for the tub bench., however,  I told her that we may need to submit a new order for the tub seat.  Functional Questionnaire: Do you need assistance with bathing/showering or dressing?: Yes Do you need assistance with meal preparation?: Yes Do you need assistance with eating?: No Do you have difficulty maintaining continence: No Do you need assistance with getting out of bed/getting out of a chair/moving?: Yes Do you have difficulty managing or taking your medications?: Yes  Follow up appointments reviewed: PCP Follow-up appointment confirmed?: Yes Date of PCP follow-up appointment?: 05/09/22 Follow-up Provider: Dr Joya Gaskins [facility] Follow-up appointment confirmed?: No Reason Specialist Follow-Up Not Confirmed: [facility] Calling Clinician Notified Provider Practice of Needed Appointment (Dr Joya Gaskins just placed the referral for neurology.) Do you need transportation to your follow-up appointment?: No Do you understand care options if your condition(s) worsen?: Yes-patient verbalized understanding    SIGNATURE  Eden Lathe, RN

## 2022-04-28 ENCOUNTER — Other Ambulatory Visit (HOSPITAL_COMMUNITY): Payer: Self-pay

## 2022-05-01 ENCOUNTER — Telehealth: Payer: Self-pay

## 2022-05-01 NOTE — Patient Outreach (Signed)
  [place] Stroke Care Coordination Follow Up  05/01/2022 Name:  [name] MRN:  [id number] DOB:  12-May-1970  Subjective: Catherine Kane is a 52 y.o. year old female who is a primary care patient of Elsie Stain, MD   An Emmi alert was received indicating patient responded to questions: Lost interest in things they used to enjoy? Sad, hopeless, anxious, or empty?.   I reached out by phone to follow up on the alert.  Care Coordination Interventions:  No, not indicated   Follow up plan:  RN CM will make outreach attempt to patient.    Encounter Outcome:  No Answer   Enzo Montgomery, RN,BSN,CCM [facility] Care Management Care Management Community Coordinator Direct Phone: [phone] Toll Free: [phone] Fax: [phone]

## 2022-05-01 NOTE — Patient Outreach (Signed)
Received a red flag Emmi stroke notification for [name] . I have assigned Enzo Montgomery, RN to call for follow up and determine if there are any Case Management needs.    [place] [facility], [place], [place] Management [phone]

## 2022-05-02 ENCOUNTER — Telehealth: Payer: Self-pay

## 2022-05-02 ENCOUNTER — Ambulatory Visit: Payer: Medicaid Other

## 2022-05-02 NOTE — Patient Outreach (Signed)
  [place] Stroke Care Coordination Follow Up  05/02/2022 Name:  Kaydense Scialdone MRN:  [id number] DOB:  [birth year]  Subjective: Ruby Khilyn Zajicek is a 52 y.o. year old female who is a primary care patient of Elsie Stain, MD   An Emmi alert was received indicating patient responded to questions: Lost interest in things they used to enjoy? Sad, hopeless, anxious, or empty?.   Incoming call from patient returning call. She voices she is doing fairly well. She has ben up walking and was bale to get out yesterday to go get her hair done which made her feel good but also wore her out. Reviewed and addressed red alert. Patient reports she has had some days were it was hard to deal with her needing to depend on others while she recovers and has felt sad about all that has happened. However, the feelings were short lived and she is aware that they are normal and to be expected. Denies any lingering thoughts/feelings or need for further interventions. She has PCP appt on 05/09/22-aware to discuss any issues or feelings with provider. She starts outpt therapy on 05/15/22. She voices that family will take her to appts. She confirms she has all her meds-no issues regarding them. Patient requested that her number be listed as main number in Epic instead of her fiance-as he keeps getting calls-updated er pt request. She denies any further RN CM needs or concerns at this time.   Care Coordination Interventions:  Yes, provided   Follow up plan: Advised patient that they would continue to get automated EMMI-Stroke post discharge calls to assess how they are doing following recent hospitalization and will receive a call from a nurse if any of their responses were abnormal. Patient voiced understanding and was appreciative of f/u call.   Encounter Outcome:  Pt. Visit Completed    Hetty Blend [facility] Care Management Care Management Community Coordinator Direct Phone:  [phone] Toll Free: [phone] Fax: [phone]

## 2022-05-02 NOTE — Patient Outreach (Signed)
  [place] Stroke Care Coordination Follow Up  05/02/2022 Name:  [name] MRN:  [id number] DOB:  [birth year]  Subjective: Catherine Kane is a 52 y.o. year old female who is a primary care patient of Elsie Stain, MD   An Emmi alert was on 04/30/22 received indicating patient responded to questions: Lost interest in things they used to enjoy? Sad, hopeless, anxious, or empty?.  I reached out by phone to follow up on the alert and spoke to  female who identified himself as patient's "friend." He voices that he is currently at work and patient at work. States patient uses his phone. He will have patient return RN CM call later when he gets off work .  Care Coordination Interventions:  Yes, provided   Follow up plan:  RN CM will make another outreach attempt if no return call from patient.    Encounter Outcome:  Pt. Visit Completed  [place]etty Blend [facility] Care Management Care Management Community Coordinator Direct Phone: [phone] Toll Free: [phone] Fax: [phone]

## 2022-05-03 ENCOUNTER — Other Ambulatory Visit: Payer: Medicaid Other

## 2022-05-03 NOTE — Patient Outreach (Signed)
  Medicaid Managed Care   Unsuccessful Outreach Note  05/03/2022 Name: Catherine Kane MRN: [id number] DOB: [birth year]  Referred by: Elsie Stain, MD Reason for referral : High Risk Managed Medicaid (MM Social work unsuccessful telephone outreach )   An unsuccessful telephone outreach was attempted today. The patient was referred to the case management team for assistance with care management and care coordination.   Follow Up Plan: A HIPAA compliant phone message was left for the patient providing contact information and requesting a return call.   Mickel Fuchs, BSW, [place] Managed Medicaid Team  [phone]

## 2022-05-03 NOTE — Patient Instructions (Signed)
  Medicaid Managed Care   Unsuccessful Outreach Note  05/03/2022 Name: Catherine Kane MRN: [id number] DOB: [birth year]  Referred by: Elsie Stain, MD Reason for referral : High Risk Managed Medicaid (MM Social work unsuccessful telephone outreach )   An unsuccessful telephone outreach was attempted today. The patient was referred to the case management team for assistance with care management and care coordination.   Follow Up Plan: A HIPAA compliant phone message was left for the patient providing contact information and requesting a return call.   Mickel Fuchs, BSW, [place] Managed Medicaid Team  [phone]

## 2022-05-05 ENCOUNTER — Telehealth: Payer: Self-pay | Admitting: *Deleted

## 2022-05-05 NOTE — Patient Outreach (Signed)
  [place] Stroke Care Coordination Follow Up  05/05/2022 Name:  Catherine Kane MRN:  [id number] DOB:  01-28-1971  Subjective: Catherine Kane is a 52 y.o. year old female who is a primary care patient of Elsie Stain, MD An Emmi alert was received indicating patient responded to questions: Went to follow-up appointment?. I reached out by phone to follow up on the alert and spoke to Patient.  Patient report appointments are scheduled for the future.  PCP on 4/2, start at outpatient neuro rehab on 4/8, and neurology appointment on 4/29.  Denies need for transportation.   Care Coordination Interventions:  Yes, provided   Follow up plan: No further intervention required.   Encounter Outcome:  Pt. Visit Completed   Valente David, RN, MSN, [place] Care Management Care Management Coordinator [phone]

## 2022-05-08 ENCOUNTER — Other Ambulatory Visit (HOSPITAL_COMMUNITY): Payer: Self-pay

## 2022-05-08 MED ORDER — IRON 325 (65 FE) MG PO TABS
1.0000 | ORAL_TABLET | Freq: Every day | ORAL | 4 refills | Status: DC
  Filled 2022-05-08 (×2): qty 30, 30d supply, fill #0

## 2022-05-08 NOTE — Progress Notes (Deleted)
New Patient Office Visit  Subjective    Patient ID: Junne Fessenden, female    DOB: 02-18-1970  Age: 52 y.o. MRN: [id number]  CC:  No chief complaint on file.   HPI December Taitym Artzer presents to establish care This is a 52 year old female former patient of patient care center but she cannot get an appointment there so she establishes here.  On arrival blood pressure 174/128.  She is out of all medications including all of her blood pressure medicines.  She has longstanding history of hypertension morbid obesity asthmatic COPD condition but no longer smoking.  She also is premenopausal with hot flashes and mood swings.  She has been on Prozac in the past which is controlled the symptoms she is out of this medication.  She has a dry cough used to be on an albuterol inhaler and is seen pulmonary previously.  She used to work for [employer] for [employer] but has lost her job 2 years ago.  She has chronic knee and hip pain.  She is in need of a Pap smear.  Patient also requesting gynecology referral.  Patient has been on prednisone with response in the past and also has taken Singulair with improvement.  Also has reflux symptoms would benefit from a PPI. Note patient has sheltered homelessness living in a motel with her husband  05/08/22  Admit date: 04/13/2022 Discharge date: 04/19/2022   Admitted From: Home Disposition: Home with outpatient therapies   Recommendations for Outpatient Follow-up:  Follow up with PCP in 1-2 weeks     Home Health: Outpatient PT OT Equipment/Devices: Walker   Discharge Condition: Stable CODE STATUS: Full code Diet recommendation: Low-salt diet   Discharge summary: PMH of HTN, migraine, anxiety, asthma, basal deficiency, vitamin D deficiency, substance abuse.  To the hospital with complaints of right-sided weakness and headache. Originally presented on 04/11/2022 to ED with headache ongoing for 3 days with vomiting.  CT of the head  was unremarkable.  Treated symptomatically with improvement in symptoms.  Was discharged on oral Zofran. Return on 3/07-2022 with complaints of right side of the face ear and neck hurting with right ear muffled feeling.  Had right TM erythema with bulging concerning for possible infection treated with Augmentin.  COVID flu and RSV was negative.  Omnicef was sent by PCP outpatient but patient left AMA. Return to ER on 3/7 due to persistent right-sided numbness.  Now admitted with a diagnosis of [place] as well as dural venous sinus thrombosis.  Initially in the ICU.  Neurology consulted.  Started on IV heparin.   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 Presented with recurrent headache likely going since the beginning of the March. Found to have occlusive thrombus in the right transverse and sigmoid sinuses as well as near occlusive thrombus extending from vertex superior sagittal sinus to torcula. CT angio negative for any large vessel occlusion. MRI brain shows venous infarct. 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 Neurology was consulted. Patient was started on IV heparin as the risk for progressive decline without anticoagulation outweighed the risk for worsening SAH per neurology.  Now on Eliquis.   Still has mild headache but resolution of photophobia. Continue Fioricet and tylenol as needed. Currently on Keppra for seizure prophylaxis for 7 days.  Last day 3/15.   Hypercoagulable state. Patient presented with dural venous sinus thrombosis. Workup initiated for hypercoagulable state. So far workup is unremarkable and etiology is unclear. CT chest  abdomen pelvis negative for any malignant site or any other source of thromboembolism. Reported to have some right ear infection although suspect that his swelling and erythema in the setting of venous sinus thrombosis. Patient is positive for cocaine. No other fever chills or any other infectious symptoms  reported. No other hardware reported by the patient. No prior history of any inflammatory condition. COVID is negative.   Hypertensive emergency. Blood pressure was elevated. Resume Norvasc, Coreg, Diovan and hydrochlorothiazide.  Now is stable.   GERD. Continue PPI.   Hypokalemia. Replaced and adequate.   Mood disorder. On Prozac. Continue. On chlorphentermine for allergies.   HLD. On Lipitor 40 mg daily. Continue.   Substance abuse. Patient is cocaine positive. Counseled the patient to avoid any substance abuse in future.   Class 3 obesity Body mass index is 41.47 kg/m.  Placing the patient at high risk of poor outcome.   Severe B12 deficiency.  B12 levels 139. Patient received 3 days of B12 injection, will discharge with oral supplement.   Patient was waiting in the hospital to go to inpatient rehab, however she did good clinical recovery and currently able to go home with family.  She will do outpatient therapies.       Discharge Diagnoses:  Principal Problem:   Stroke (cerebrum) [facility]) Outpatient Encounter Medications as of 05/09/2022  Medication Sig   acetaminophen (TYLENOL) 325 MG tablet Take 2 tablets (650 mg total) by mouth every 6 (six) hours as needed for headache (1st line).   albuterol (VENTOLIN HFA) 108 (90 Base) MCG/ACT inhaler Inhale 2 puffs into the lungs every 6 (six) hours as needed for wheezing or shortness of breath.   amLODipine (NORVASC) 10 MG tablet Take 1 tablet (10 mg total) by mouth daily.   apixaban (ELIQUIS) 5 MG TABS tablet Take 1 tablet (5 mg total) by mouth 2 (two) times daily.   atorvastatin (LIPITOR) 40 MG tablet Take 1 tablet (40 mg total) by mouth daily.   butalbital-acetaminophen-caffeine (FIORICET) 50-325-40 MG tablet Take 1 tablet by mouth every 4 (four) hours as needed for headache or migraine.   carvedilol (COREG) 12.5 MG tablet Take 1 tablet (12.5 mg total) by mouth 2 (two) times daily with a meal.   chlorpheniramine  (CHLOR-TRIMETON) 4 MG tablet Take 4 mg by mouth every 4 (four) hours as needed for allergies.   cyanocobalamin 1000 MCG tablet Take 1 tablet (1,000 mcg total) by mouth daily.   diclofenac Sodium (VOLTAREN) 1 % GEL Apply 2 g topically 4 (four) times daily. To painful joints (Patient taking differently: Apply 2 g topically daily as needed (for painful joints).)   docusate sodium (COLACE) 100 MG capsule Take 1 capsule (100 mg total) by mouth daily.   ergocalciferol (VITAMIN D2) 1.25 MG (50000 UT) capsule Take 1 capsule (50,000 Units total) by mouth every 7 (seven) days.   Ferrous Sulfate (IRON) 325 (65 Fe) MG TABS Take 1 tablet by mouth daily.   FLUoxetine (PROZAC) 40 MG capsule Take 1 capsule (40 mg total) by mouth daily.   levETIRAcetam (KEPPRA) 500 MG tablet Take 1 tablet (500 mg total) by mouth 2 (two) times daily for 2 days.   montelukast (SINGULAIR) 10 MG tablet Take 1 tablet (10 mg total) by mouth at bedtime.   ondansetron (ZOFRAN) 4 MG tablet Take 1 tablet (4 mg total) by mouth every 8 (eight) hours as needed for nausea or vomiting.   pantoprazole (PROTONIX) 40 MG tablet Take 1 tablet (40 mg total) by  mouth daily. Take 30-60 minutes before the first meal of the day.   traZODone (DESYREL) 100 MG tablet Take 1 tablet (100 mg total) by mouth at bedtime. (Patient taking differently: Take 100 mg by mouth at bedtime as needed for sleep.)   valsartan-hydrochlorothiazide (DIOVAN-HCT) 320-25 MG tablet Take 1 tablet by mouth daily.   No facility-administered encounter medications on file as of 05/09/2022.    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/13/2022   Vitamin B12 deficiency 09/2019   Vitamin D deficiency 10/2018   Well woman exam with routine gynecological exam 11/26/2018    Past Surgical History:  Procedure Laterality Date   TONSILLECTOMY     TUBAL  LIGATION      Family History  Problem Relation Age of Onset   Heart disease Mother    Heart disease Sister    Breast cancer Maternal Aunt    Breast cancer Maternal Grandmother     Social History   Socioeconomic History   Marital status: Single    Spouse name: Not on file   Number of children: Not on file   Years of education: Not on file   Highest education level: 12th grade  Occupational History   Not on file  Tobacco Use   Smoking status: Never   Smokeless tobacco: Never  Vaping Use   Vaping Use: Never used  Substance and Sexual Activity   Alcohol use: Yes    Alcohol/week: 1.0 standard drink of alcohol    Types: 1 Glasses of wine per week    Comment: one glass once a month   Drug use: No   Sexual activity: Yes    Birth control/protection: Surgical  Other Topics Concern   Not on file  Social History Narrative   Not on file   Social Determinants of Health   Financial Resource Strain: Not on file  Food Insecurity: No Food Insecurity (04/17/2022)   Hunger Vital Sign    Worried About Running Out of Food in the Last Year: Never true    Ran Out of Food in the Last Year: Never true  Transportation Needs: No Transportation Needs (04/17/2022)   PRAPARE - Hydrologist (Medical): No    Lack of Transportation (Non-Medical): No  Physical Activity: Not on file  Stress: Not on file  Social Connections: Not on file  Intimate Partner Violence: Not At Risk (04/17/2022)   Humiliation, Afraid, Rape, and Kick questionnaire    Fear of Current or Ex-Partner: No    Emotionally Abused: No    Physically Abused: No    Sexually Abused: No    Review of Systems  Constitutional:  Negative for chills, diaphoresis, fever, malaise/fatigue and weight loss.  HENT:  Negative for congestion, hearing loss, nosebleeds, sore throat and tinnitus.   Eyes:  Negative for blurred vision, photophobia and redness.  Respiratory:  Positive for cough, shortness of breath and  wheezing. Negative for hemoptysis, sputum production and stridor.   Cardiovascular:  Negative for chest pain, palpitations, orthopnea, claudication, leg swelling and PND.  Gastrointestinal:  Negative for abdominal pain, blood in stool, constipation, diarrhea, heartburn, nausea and vomiting.  Genitourinary:  Negative for dysuria, flank pain, frequency, hematuria and urgency.  Musculoskeletal:  Negative for back pain, falls, joint pain, myalgias and neck pain.  Skin:  Negative for  itching and rash.  Neurological:  Negative for dizziness, tingling, tremors, sensory change, speech change, focal weakness, seizures, loss of consciousness, weakness and headaches.  Endo/Heme/Allergies:  Negative for environmental allergies and polydipsia. Does not bruise/bleed easily.  Psychiatric/Behavioral:  Negative for memory loss, substance abuse and suicidal ideas. The patient is nervous/anxious. The patient does not have insomnia.         Objective    There were no vitals taken for this visit.  Physical Exam Vitals reviewed.  Constitutional:      Appearance: Normal appearance. She is well-developed. She is obese. She is not diaphoretic.  HENT:     Head: Normocephalic and atraumatic.     Nose: No nasal deformity, septal deviation, mucosal edema or rhinorrhea.     Right Sinus: No maxillary sinus tenderness or frontal sinus tenderness.     Left Sinus: No maxillary sinus tenderness or frontal sinus tenderness.     Mouth/Throat:     Pharynx: No oropharyngeal exudate.  Eyes:     General: No scleral icterus.    Conjunctiva/sclera: Conjunctivae normal.     Pupils: Pupils are equal, round, and reactive to light.  Neck:     Thyroid: No thyromegaly.     Vascular: No carotid bruit or JVD.     Trachea: Trachea normal. No tracheal tenderness or tracheal deviation.  Cardiovascular:     Rate and Rhythm: Normal rate and regular rhythm.     Chest Wall: PMI is not displaced.     Pulses: Normal pulses. No  decreased pulses.     Heart sounds: Normal heart sounds, S1 normal and S2 normal. Heart sounds not distant. No murmur heard.    No systolic murmur is present.     No diastolic murmur is present.     No friction rub. No gallop. No S3 or S4 sounds.  Pulmonary:     Effort: Pulmonary effort is normal. No tachypnea, accessory muscle usage or respiratory distress.     Breath sounds: No stridor. Wheezing present. No decreased breath sounds, rhonchi or rales.  Chest:     Chest wall: No tenderness.  Abdominal:     General: Bowel sounds are normal. There is no distension.     Palpations: Abdomen is soft. Abdomen is not rigid.     Tenderness: There is no abdominal tenderness. There is no guarding or rebound.  Musculoskeletal:        General: Normal range of motion.     Cervical back: Normal range of motion and neck supple. No edema, erythema or rigidity. No muscular tenderness. Normal range of motion.  Lymphadenopathy:     Head:     Right side of head: No submental or submandibular adenopathy.     Left side of head: No submental or submandibular adenopathy.     Cervical: No cervical adenopathy.  Skin:    General: Skin is warm and dry.     Coloration: Skin is not pale.     Findings: No rash.     Nails: There is no clubbing.  Neurological:     Mental Status: She is alert and oriented to person, place, and time.     Sensory: No sensory deficit.  Psychiatric:        Speech: Speech normal.        Behavior: Behavior normal.         Assessment & Plan:   Problem List Items Addressed This Visit   None  No follow-ups on file.   Saralyn Pilar  Joya Gaskins, MD

## 2022-05-09 ENCOUNTER — Other Ambulatory Visit: Payer: Self-pay

## 2022-05-09 ENCOUNTER — Ambulatory Visit: Payer: Medicaid Other | Admitting: Critical Care Medicine

## 2022-05-10 ENCOUNTER — Other Ambulatory Visit (HOSPITAL_COMMUNITY): Payer: Self-pay

## 2022-05-10 ENCOUNTER — Other Ambulatory Visit: Payer: Self-pay | Admitting: Critical Care Medicine

## 2022-05-10 ENCOUNTER — Other Ambulatory Visit: Payer: Medicaid Other

## 2022-05-10 MED ORDER — APIXABAN 5 MG PO TABS
5.0000 mg | ORAL_TABLET | Freq: Two times a day (BID) | ORAL | 0 refills | Status: DC
  Filled 2022-05-10 – 2022-05-24 (×3): qty 60, 30d supply, fill #0

## 2022-05-10 MED ORDER — ATORVASTATIN CALCIUM 40 MG PO TABS
40.0000 mg | ORAL_TABLET | Freq: Every day | ORAL | 0 refills | Status: DC
  Filled 2022-05-10 – 2022-05-19 (×3): qty 30, 30d supply, fill #0

## 2022-05-10 NOTE — Patient Instructions (Signed)
Visit Information  [name] was given information about Medicaid Managed Care team care coordination services as a part of their [place] Medicaid benefit. Catherine Kane verbally consentedto engagement with the [facility] Managed Care team.   If you are experiencing a medical emergency, please call 911 or report to your local emergency department or urgent care.   If you have a non-emergency medical problem during routine business hours, please contact your provider's office and ask to speak with a nurse.   For questions related to your [facility] health plan, please call: [phone]  OR visit the member homepage at: [email or web address]  If you would like to schedule transportation through your [facility] plan, please call the following number at least 2 days in advance of your appointment: [phone]  If you are experiencing a behavioral health crisis, call the [place] at [phone]). The line is available 24 hours a day, seven days a week.  If you would like help to quit smoking, call 1-800-QUIT-NOW 705 026 2949) OR Espaol: 1-855-Djelo-Ya HD:1601594) o para ms informacin haga clic aqu or Text READY to 200-400 to register via text  [name] - following are the goals we discussed in your visit today:    Social Worker will follow up in 30 days.   Catherine Kane, Catherine Kane, MHA [facility]  Managed Medicaid Social Worker [phone]   Following is a copy of your plan of care:  There are no care plans that you recently modified to display for this patient.

## 2022-05-10 NOTE — Telephone Encounter (Signed)
Requested medication (s) are due for refill today:   Due 05/20/22  Requested medication (s) are on the active medication list:   yes    Last refill:   Both meds 04/19/22  #30 day supply  Future visit scheduled   Yes 05/24/22  Notes to clinic: Historical provider, please review. Thank you.  Requested Prescriptions  Pending Prescriptions Disp Refills   apixaban (ELIQUIS) 5 MG TABS tablet 60 tablet 0    Sig: Take 1 tablet (5 mg total) by mouth 2 (two) times daily.     Hematology:  Anticoagulants - apixaban Passed - 05/10/2022  8:40 AM      Passed - PLT in normal range and within 360 days    Platelets  Date Value Ref Range Status  04/18/2022 280 150 - 400 K/uL Final  11/01/2021 305 150 - 450 x10E3/uL Final         Passed - HGB in normal range and within 360 days    Hemoglobin  Date Value Ref Range Status  04/18/2022 13.8 12.0 - 15.0 g/dL Final  11/01/2021 15.2 11.1 - 15.9 g/dL Final         Passed - HCT in normal range and within 360 days    HCT  Date Value Ref Range Status  04/18/2022 41.0 36.0 - 46.0 % Final   Hematocrit  Date Value Ref Range Status  11/01/2021 46.0 34.0 - 46.6 % Final         Passed - Cr in normal range and within 360 days    Creatinine  Date Value Ref Range Status  09/16/2013 0.99 0.60 - 1.30 mg/dL Final   Creatinine, Ser  Date Value Ref Range Status  04/19/2022 0.88 0.44 - 1.00 mg/dL Final         Passed - AST in normal range and within 360 days    AST  Date Value Ref Range Status  04/15/2022 20 15 - 41 U/L Final   SGOT(AST)  Date Value Ref Range Status  09/16/2013 21 15 - 37 Unit/L Final         Passed - ALT in normal range and within 360 days    ALT  Date Value Ref Range Status  04/15/2022 21 0 - 44 U/L Final   SGPT (ALT)  Date Value Ref Range Status  09/16/2013 19 U/L Final    Comment:    14-63 NOTE: New Reference Range 08/26/13          Passed - Valid encounter within last 12 months    Recent Outpatient Visits            6 months ago Essential hypertension   [place], MD       Future Appointments             In 2 weeks Mathis Dad [place]   In 3 weeks Garvin Fila, MD [facility]             atorvastatin (LIPITOR) 40 MG tablet 30 tablet 0    Sig: Take 1 tablet (40 mg total) by mouth daily.     Cardiovascular:  Antilipid - Statins Failed - 05/10/2022  8:40 AM      Failed - Lipid Panel in normal range within the last 12 months    Cholesterol, Total  Date Value Ref Range Status  11/01/2021 172 100 - 199 mg/dL Final  Cholesterol  Date Value Ref Range Status  04/15/2022 154 0 - 200 mg/dL Final   LDL Chol Calc (NIH)  Date Value Ref Range Status  11/01/2021 113 (H) 0 - 99 mg/dL Final   LDL Cholesterol  Date Value Ref Range Status  04/15/2022 93 0 - 99 mg/dL Final    Comment:           Total Cholesterol/HDL:CHD Risk Coronary Heart Disease Risk Table                     Men   Women  1/2 Average Risk   3.4   3.3  Average Risk       5.0   4.4  2 X Average Risk   9.6   7.1  3 X Average Risk  23.4   11.0        Use the calculated Patient Ratio above and the CHD Risk Table to determine the patient's CHD Risk.        ATP III CLASSIFICATION (LDL):  <100     mg/dL   Optimal  100-129  mg/dL   Near or Above                    Optimal  130-159  mg/dL   Borderline  160-189  mg/dL   High  >190     mg/dL   Very High Performed at [facility] [address], [place], [place] [address]    HDL  Date Value Ref Range Status  04/15/2022 44 >40 mg/dL Final  11/01/2021 48 >39 mg/dL Final   Triglycerides  Date Value Ref Range Status  04/15/2022 86 <150 mg/dL Final         Passed - Patient is not pregnant      Passed - Valid encounter within last 12 months    Recent Outpatient Visits           6 months ago Essential hypertension   [place], MD       Future Appointments             In 2 weeks Thereasa Solo, Angela M, [place]   In 3 weeks Leonie Man, Lucy [place], MD [facility]

## 2022-05-10 NOTE — Patient Outreach (Signed)
Medicaid Managed Care Social Work Note  05/10/2022 Name:  Catherine Kane MRN:  [id number] DOB:  July 07, 1970  Catherine Kane is an 52 y.o. year old female who is a primary patient of Catherine Kane, Catherine Harry, MD.  The [facility] Managed Care Coordination team was consulted for assistance with:   eye and dental   [name] was given information about Medicaid Managed Care Coordination team services today. [place] Patient agreed to services and verbal consent obtained.  Engaged with patient  for by telephone forinitial visit in response to referral for case management and/or care coordination services.   Assessments/Interventions:  Review of past medical history, allergies, medications, health status, including review of consultants reports, laboratory and other test data, was performed as part of comprehensive evaluation and provision of chronic care management services.  SDOH: (Social Determinant of Health) assessments and interventions performed: BSW completed a telephone outreach. Patient stated she receieved a letter from her insurance company explaining all of her benefits. Patient stated no resources were needed at this time. Patient asked BSW if she knew any other dental and eye centers accepting new patients with Medicaid. BSW emailed patient a list of resources for eye and dental for [facility]. No other resources needed at this time.  Advanced Directives Status:  Not addressed in this encounter.  Care Plan                 Allergies  Allergen Reactions   Grass Pollen(K-O-R-T-Swt Vern)     Medications Reviewed Today     Reviewed by Catherine Kane, CPhT (Pharmacy Technician) on 04/14/22 at [place] List Status: Complete   Medication Order Taking? Sig Documenting Provider Last Dose Status Informant  albuterol (VENTOLIN HFA) 108 (90 Base) MCG/ACT inhaler [id number] Yes Inhale 2 puffs into the lungs every 6 (six) hours as needed for wheezing or  shortness of breath. Catherine Stain, MD Past Week Active Self  amLODipine (NORVASC) 10 MG tablet [id number] Yes Take 1 tablet (10 mg total) by mouth daily. Catherine Stain, MD 04/09/2022 Active Self  aspirin EC 81 MG tablet [id number] No Take 81 mg by mouth daily.  Patient not taking: Reported on 04/14/2022   [provider] Not Taking Active Self           Med Note (WHITE, Catherine Kane   Fri Apr 14, 2022 12:30 AM) Stopped taking since she felt like it was too much medicine.  atorvastatin (LIPITOR) 10 MG tablet [id number] Yes Take 1 tablet (10 mg total) by mouth daily. Catherine Stain, MD 04/09/2022 Active Self  benzonatate (TESSALON) 100 MG capsule [id number] Yes Take 1 capsule (100 mg total) by mouth 3 (three) times daily as needed for cough. Catherine Stain, MD Past Week Active Self  carvedilol (COREG) 12.5 MG tablet [id number] Yes Take 1 tablet (12.5 mg total) by mouth 2 (two) times daily with a meal. Catherine Stain, MD 04/09/2022 Active Self  cefdinir (OMNICEF) 300 MG capsule [id number] No Take 1 capsule (300 mg total) by mouth 2 (two) times daily.  Patient not taking: Reported on 04/14/2022   Catherine Stain, MD Not Taking Active Self           Med Note [facility], Catherine Kane   Fri Apr 14, 2022 12:31 AM) New prescription  chlorpheniramine (CHLOR-TRIMETON) 4 MG tablet [id number] Yes Take 4 mg by mouth every 4 (four) hours as needed for allergies. [provider] UNKNOWN Active Self  diclofenac Sodium (  VOLTAREN) 1 % GEL [id number] No Apply 2 g topically 4 (four) times daily. To painful joints  Patient taking differently: Apply 2 g topically daily as needed (for painful joints).   Catherine Stain, MD unk prn Active Self  ergocalciferol (VITAMIN D2) 1.25 MG (50000 UT) capsule [id number] Yes Take 1 capsule (50,000 Units total) by mouth every 7 (seven) days. Catherine Stain, MD 04/09/2022 Active Self  Ferrous Sulfate (IRON) 325 (65 Fe) MG TABS [id number] Yes Take 1 tablet by mouth daily.  [provider] 04/09/2022 Active Self  FLUoxetine (PROZAC) 40 MG capsule [id number] Yes Take 1 capsule (40 mg total) by mouth daily. Catherine Stain, MD 04/09/2022 Active Self  montelukast (SINGULAIR) 10 MG tablet [id number] Yes Take 1 tablet (10 mg total) by mouth at bedtime. Catherine Stain, MD 04/09/2022 Active Self  ondansetron (ZOFRAN) 4 MG tablet [id number] No Take 1 tablet (4 mg total) by mouth every 8 (eight) hours as needed for nausea or vomiting.  Patient not taking: Reported on 04/14/2022   Catherine Mail, PA-C Not Taking Active Self  pantoprazole (PROTONIX) 40 MG tablet [id number] Yes Take 1 tablet (40 mg total) by mouth daily. Take 30-60 minutes before the first meal of the day. Catherine Stain, MD 04/09/2022 Active Self  potassium chloride (KLOR-CON) 10 MEQ tablet [id number] Yes Take 1 tablet (10 mEq total) by mouth 2 (two) times daily. Catherine Stain, MD 04/09/2022 Active Self  Respiratory Therapy Supplies (FLUTTER) DEVI [id number]  Use as directed  Patient not taking: Reported on 11/01/2021   Tanda Rockers, MD  Active Self  traZODone (DESYREL) 100 MG tablet [id number] Yes Take 1 tablet (100 mg total) by mouth at bedtime.  Patient taking differently: Take 100 mg by mouth at bedtime as needed for sleep.   Catherine Stain, MD Past Week Active Self  valsartan-hydrochlorothiazide (DIOVAN-HCT) 320-25 MG tablet [id number] Yes Take 1 tablet by mouth daily. 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   Follow up:  Patient agrees to Care Plan and Follow-up.  Plan: The Managed Medicaid care management team will reach out to the patient again over the next 30 days.  Date/time of next scheduled Social Work care management/care coordination outreach:  06/09/2022 Mickel Fuchs, Arita Miss, [place] [facility] Social Worker [phone]

## 2022-05-12 ENCOUNTER — Other Ambulatory Visit: Payer: Self-pay

## 2022-05-15 ENCOUNTER — Ambulatory Visit: Payer: Medicaid Other | Admitting: Physical Therapy

## 2022-05-15 ENCOUNTER — Encounter: Payer: Self-pay | Admitting: Physical Therapy

## 2022-05-15 ENCOUNTER — Ambulatory Visit: Payer: Medicaid Other | Admitting: Occupational Therapy

## 2022-05-15 VITALS — BP 125/100 | HR 63

## 2022-05-15 DIAGNOSIS — I6310 Cerebral infarction due to embolism of unspecified precerebral artery: Secondary | ICD-10-CM

## 2022-05-15 DIAGNOSIS — G08 Intracranial and intraspinal phlebitis and thrombophlebitis: Secondary | ICD-10-CM

## 2022-05-15 NOTE — Therapy (Signed)
[facility] [address] [place], [place], [address] Phone: [phone]   Fax:  [phone]  Patient Details  Name: Catherine Kane MRN: [id number] Date of Birth: [birth year] Referring Provider:  Dorcas Carrow, MD PCP: Storm Frisk, MD  Encounter Date: 05/15/2022  Patient attempted to be evaluated for initial PT eval on 05/15/2022; however, diastolic outside of safe therapeutic range. Provided patient education on safe BP parameters and encouraged follow up with PCP for management. Educated on going to ED if becomes symptomatic. Provided patient with American Heart Association log for tracking at home.  Vitals:   05/15/22 1455 05/15/22 1504  BP: (!) 127/100 (!) 125/100  Pulse: 63      Carmelia Bake, PT, DPT 05/15/2022, 3:21 PM  [facility] [address] [place], [place], [address] Phone: [phone]   Fax:  [phone]

## 2022-05-16 NOTE — Therapy (Signed)
Pt unable to be seen by OT as BP outside of therapeutic range when she arrived to her PT evaluation. Will continue therapy efforts as able.

## 2022-05-19 ENCOUNTER — Other Ambulatory Visit: Payer: Self-pay | Admitting: Critical Care Medicine

## 2022-05-19 ENCOUNTER — Other Ambulatory Visit: Payer: Self-pay

## 2022-05-19 ENCOUNTER — Other Ambulatory Visit (HOSPITAL_COMMUNITY): Payer: Self-pay

## 2022-05-19 MED ORDER — CARVEDILOL 12.5 MG PO TABS
12.5000 mg | ORAL_TABLET | Freq: Two times a day (BID) | ORAL | 0 refills | Status: DC
  Filled 2022-05-19 (×3): qty 60, 30d supply, fill #0

## 2022-05-23 ENCOUNTER — Telehealth: Payer: Self-pay | Admitting: Pharmacist

## 2022-05-23 NOTE — Progress Notes (Signed)
Patient attempted to be outreached by Michiel Cowboy, PharmD Candidate on 05/23/2022 to discuss hypertension. Voicemail box full, no message left.   Michiel Cowboy, PharmD Candidate   Catie Eppie Gibson, PharmD, BCACP, CPP [facility] [phone]

## 2022-05-24 ENCOUNTER — Ambulatory Visit: Payer: Medicaid Other | Admitting: Physician Assistant

## 2022-05-24 ENCOUNTER — Other Ambulatory Visit (HOSPITAL_COMMUNITY): Payer: Self-pay

## 2022-05-24 ENCOUNTER — Other Ambulatory Visit: Payer: Self-pay

## 2022-05-24 ENCOUNTER — Other Ambulatory Visit: Payer: Self-pay | Admitting: Critical Care Medicine

## 2022-05-24 NOTE — Telephone Encounter (Addendum)
[facility] called and spoke to [place], [place] about the refill(s) apixaban requested. Advised it was sent on 05/10/22 #60/0 refill(s). She says it's on file because it was too early to refill when it was received. She says it will be processed and mailed out tomorrow and the patient will receive on Friday. Patient called and advised of the above, she says that will be fine. I also asked about the Cyanocobalamin and advised it has expired off her profile. She says she was taking it in the hospital when she was there for a stroke and the hospital doctor gave her a Rx and she picked it up at the hospital pharmacy. She asks should she still take it. She also mentions that she needs to f/u and the earliest appointment is 06/08/22, so she has her transportation secured. I advised for the time being she could go to the mobile unit, location/times given for today and tomorrow. I advised I will send this to Dr. Delford Field for him to decide. If she's to continue, send to:  [facility] Phone: [phone]  Fax: [phone]

## 2022-05-24 NOTE — Telephone Encounter (Signed)
Medication Refill - Medication: apixaban (ELIQUIS) 5 MG TABS tablet and Cyanocobalamin   Has the patient contacted their pharmacy? Yes.   Pharmacy stated she needed approval from the ordering prescriber but she was not able to contact her Neurologist so they said she could call her PCP.  Patient has taken her last pill this morning but does not have her evening pill as she take 2 day. The Cyanocobalamin  is not listed on her medication list but she will take her last pill on tomorrow. Patient had to reschedule her appt for today as her transportation did not show up.  Preferred Pharmacy (with phone number or street name):  [facility] Phone: [phone]  Fax: [phone]     Has the patient been seen for an appointment in the last year OR does the patient have an upcoming appointment? Yes.    Agent: Please be advised that RX refills may take up to 3 business days. We ask that you follow-up with your pharmacy.

## 2022-05-24 NOTE — Progress Notes (Deleted)
Patient ID: Catherine Kane, female   DOB: 01/10/1971, 52 y.o.   MRN: [id number]  After hospitalization 3/7-3/13 with subarachnoid hemorrage Discharge summary: PMH of HTN, migraine, anxiety, asthma, basal deficiency, vitamin D deficiency, substance abuse.  To the hospital with complaints of right-sided weakness and headache. Originally presented on 04/11/2022 to ED with headache ongoing for 3 days with vomiting.  CT of the head was unremarkable.  Treated symptomatically with improvement in symptoms.  Was discharged on oral Zofran. Return on 3/07-2022 with complaints of right side of the face ear and neck hurting with right ear muffled feeling.  Had right TM erythema with bulging concerning for possible infection treated with Augmentin.  COVID flu and RSV was negative.  Omnicef was sent by PCP outpatient but patient left AMA. Return to ER on 3/7 due to persistent right-sided numbness.  Now admitted with a diagnosis of SAH as well as dural venous sinus thrombosis.  Initially in the ICU.  Neurology consulted.  Started on IV heparin.   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 Presented with recurrent headache likely going since the beginning of the March. Found to have occlusive thrombus in the right transverse and sigmoid sinuses as well as near occlusive thrombus extending from vertex superior sagittal sinus to torcula. CT angio negative for any large vessel occlusion. MRI brain shows venous infarct. 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 Neurology was consulted. Patient was started on IV heparin as the risk for progressive decline without anticoagulation outweighed the risk for worsening SAH per neurology.  Now on Eliquis.   Still has mild headache but resolution of photophobia. Continue Fioricet and tylenol as needed. Currently on Keppra for seizure prophylaxis for 7 days.  Last day 3/15.   Hypercoagulable state. Patient presented with dural venous  sinus thrombosis. Workup initiated for hypercoagulable state. So far workup is unremarkable and etiology is unclear. CT chest abdomen pelvis negative for any malignant site or any other source of thromboembolism. Reported to have some right ear infection although suspect that his swelling and erythema in the setting of venous sinus thrombosis. Patient is positive for cocaine. No other fever chills or any other infectious symptoms reported. No other hardware reported by the patient. No prior history of any inflammatory condition. COVID is negative.   Hypertensive emergency. Blood pressure was elevated. Resume Norvasc, Coreg, Diovan and hydrochlorothiazide.  Now is stable.   GERD. Continue PPI.   Hypokalemia. Replaced and adequate.   Mood disorder. On Prozac. Continue. On chlorphentermine for allergies.   HLD. On Lipitor 40 mg daily. Continue.   Substance abuse. Patient is cocaine positive. Counseled the patient to avoid any substance abuse in future.   Class 3 obesity Body mass index is 41.47 kg/m.  Placing the patient at high risk of poor outcome.   Severe B12 deficiency.  B12 levels 139. Patient received 3 days of B12 injection, will discharge with oral supplement.   Patient was waiting in the hospital to go to inpatient rehab, however she did good clinical recovery and currently able to go home with family.  She will do outpatient therapies.       Discharge Diagnoses:  Principal Problem:   Stroke (cerebrum) (HCC)

## 2022-05-25 ENCOUNTER — Other Ambulatory Visit: Payer: Self-pay

## 2022-05-26 ENCOUNTER — Other Ambulatory Visit (HOSPITAL_COMMUNITY): Payer: Self-pay

## 2022-06-05 ENCOUNTER — Inpatient Hospital Stay: Payer: Medicaid Other | Admitting: Neurology

## 2022-06-05 ENCOUNTER — Encounter: Payer: Self-pay | Admitting: Neurology

## 2022-06-05 ENCOUNTER — Other Ambulatory Visit (HOSPITAL_COMMUNITY): Payer: Self-pay

## 2022-06-07 ENCOUNTER — Other Ambulatory Visit: Payer: Self-pay

## 2022-06-08 ENCOUNTER — Ambulatory Visit: Payer: Medicaid Other | Admitting: Physician Assistant

## 2022-06-09 ENCOUNTER — Other Ambulatory Visit: Payer: Medicaid Other

## 2022-06-09 NOTE — Patient Outreach (Signed)
  Medicaid Managed Care   Unsuccessful Outreach Note  06/09/2022 Name: Catherine Kane MRN: [id number] DOB: 1970-06-28  Referred by: Storm Frisk, MD Reason for referral : High Risk Managed Medicaid (MM Social work unsuccessful telephone outreach )   An unsuccessful telephone outreach was attempted today. The patient was referred to the case management team for assistance with care management and care coordination.   Follow Up Plan: The patient has been provided with contact information for the care management team and has been advised to call with any health related questions or concerns.   Abelino Derrick, MHA [facility]  Managed [facility] Social Worker [phone]

## 2022-06-09 NOTE — Patient Instructions (Signed)
Visit Information  Catherine Kane was given information about Medicaid Managed Care team care coordination services as a part of their [facility] Community Plan Medicaid benefit. Catherine Kane verbally consented to engagement with the [facility] Managed Care team.   If you are experiencing a medical emergency, please call 911 or report to your local emergency department or urgent care.   If you have a non-emergency medical problem during routine business hours, please contact your provider's office and ask to speak with a nurse.   For questions related to your [facility], please call: [phone] or visit the homepage here: [email or web address]  If you would like to schedule transportation through your [facility], please call the following number at least 2 days in advance of your appointment: [phone]   Rides for urgent appointments can also be made after hours by calling Member Services.  Call the Behavioral Health Crisis Line at [phone]) [phone], at any time, 24 hours a day, 7 days a week. If you are in danger or need immediate medical attention call 911.  If you would like help to quit smoking, call 1-800-QUIT-NOW ((765) 694-7197) OR Espaol: 1-855-Djelo-Ya (7-253-664-4034) o para ms informacin haga clic aqu or Text READY to 742-595 to register via text  Catherine Kane - following are the goals we discussed in your visit today:   Goals Addressed   None       The  Patient                                              has been provided with contact information for the Managed Medicaid care management team and has been advised to call with any health related questions or concerns.   Catherine Kane, Catherine Kane, MHA [facility]  Managed Medicaid Social Worker [phone]   Following is a copy of your plan of care:  There are no care plans that you recently modified  to display for this patient.

## 2022-06-12 ENCOUNTER — Other Ambulatory Visit (HOSPITAL_COMMUNITY): Payer: Self-pay

## 2022-06-12 ENCOUNTER — Other Ambulatory Visit: Payer: Self-pay | Admitting: Critical Care Medicine

## 2022-06-12 MED ORDER — ATORVASTATIN CALCIUM 40 MG PO TABS
40.0000 mg | ORAL_TABLET | Freq: Every day | ORAL | 0 refills | Status: DC
  Filled 2022-06-12 – 2022-08-09 (×4): qty 30, 30d supply, fill #0

## 2022-06-13 ENCOUNTER — Encounter: Payer: Self-pay | Admitting: Pharmacist

## 2022-06-13 ENCOUNTER — Other Ambulatory Visit: Payer: Self-pay

## 2022-06-16 ENCOUNTER — Other Ambulatory Visit: Payer: Self-pay

## 2022-06-17 ENCOUNTER — Other Ambulatory Visit: Payer: Self-pay | Admitting: Critical Care Medicine

## 2022-06-19 ENCOUNTER — Encounter (HOSPITAL_COMMUNITY): Payer: Self-pay

## 2022-06-19 ENCOUNTER — Other Ambulatory Visit (HOSPITAL_COMMUNITY): Payer: Self-pay

## 2022-06-19 ENCOUNTER — Ambulatory Visit: Payer: Medicaid Other | Admitting: Physician Assistant

## 2022-06-19 ENCOUNTER — Other Ambulatory Visit: Payer: Self-pay

## 2022-06-19 MED ORDER — CARVEDILOL 12.5 MG PO TABS
12.5000 mg | ORAL_TABLET | Freq: Two times a day (BID) | ORAL | 0 refills | Status: DC
  Filled 2022-06-19 – 2022-08-09 (×3): qty 180, 90d supply, fill #0

## 2022-06-19 MED ORDER — APIXABAN 5 MG PO TABS
5.0000 mg | ORAL_TABLET | Freq: Two times a day (BID) | ORAL | 1 refills | Status: DC
  Filled 2022-06-19 – 2022-06-22 (×2): qty 180, 90d supply, fill #0
  Filled 2022-06-27: qty 60, 30d supply, fill #0
  Filled 2022-08-09: qty 180, 90d supply, fill #0
  Filled 2022-11-03: qty 180, 90d supply, fill #1

## 2022-06-19 NOTE — Progress Notes (Deleted)
Patient ID: Catherine Kane, female   DOB: 12-Mar-1970, 52 y.o.   MRN: [id number]   Discharge summary: PMH of HTN, migraine, anxiety, asthma, basal deficiency, vitamin D deficiency, substance abuse.  To the hospital with complaints of right-sided weakness and headache. Originally presented on 04/11/2022 to ED with headache ongoing for 3 days with vomiting.  CT of the head was unremarkable.  Treated symptomatically with improvement in symptoms.  Was discharged on oral Zofran. Return on 3/07-2022 with complaints of right side of the face ear and neck hurting with right ear muffled feeling.  Had right TM erythema with bulging concerning for possible infection treated with Augmentin.  COVID flu and RSV was negative.  Omnicef was sent by PCP outpatient but patient left AMA. Return to ER on 3/7 due to persistent right-sided numbness.  Now admitted with a diagnosis of SAH as well as dural venous sinus thrombosis.  Initially in the ICU.  Neurology consulted.  Started on IV heparin.   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 Presented with recurrent headache likely going since the beginning of the March. Found to have occlusive thrombus in the right transverse and sigmoid sinuses as well as near occlusive thrombus extending from vertex superior sagittal sinus to torcula. CT angio negative for any large vessel occlusion. MRI brain shows venous infarct. 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 Neurology was consulted. Patient was started on IV heparin as the risk for progressive decline without anticoagulation outweighed the risk for worsening SAH per neurology.  Now on Eliquis.   Still has mild headache but resolution of photophobia. Continue Fioricet and tylenol as needed. Currently on Keppra for seizure prophylaxis for 7 days.  Last day 3/15.   Hypercoagulable state. Patient presented with dural venous sinus thrombosis. Workup initiated for hypercoagulable  state. So far workup is unremarkable and etiology is unclear. CT chest abdomen pelvis negative for any malignant site or any other source of thromboembolism. Reported to have some right ear infection although suspect that his swelling and erythema in the setting of venous sinus thrombosis. Patient is positive for cocaine. No other fever chills or any other infectious symptoms reported. No other hardware reported by the patient. No prior history of any inflammatory condition. COVID is negative.   Hypertensive emergency. Blood pressure was elevated. Resume Norvasc, Coreg, Diovan and hydrochlorothiazide.  Now is stable.   GERD. Continue PPI.   Hypokalemia. Replaced and adequate.   Mood disorder. On Prozac. Continue. On chlorphentermine for allergies.   HLD. On Lipitor 40 mg daily. Continue.   Substance abuse. Patient is cocaine positive. Counseled the patient to avoid any substance abuse in future.   Class 3 obesity Body mass index is 41.47 kg/m.  Placing the patient at high risk of poor outcome.   Severe B12 deficiency.  B12 levels 139. Patient received 3 days of B12 injection, will discharge with oral supplement.   Patient was waiting in the hospital to go to inpatient rehab, however she did good clinical recovery and currently able to go home with family.  She will do outpatient therapies.       Discharge Diagnoses:  Principal Problem:   Stroke (cerebrum) (HCC)

## 2022-06-19 NOTE — Telephone Encounter (Signed)
Requested Prescriptions  Pending Prescriptions Disp Refills   apixaban (ELIQUIS) 5 MG TABS tablet 180 tablet 1    Sig: Take 1 tablet (5 mg total) by mouth 2 (two) times daily.     Hematology:  Anticoagulants - apixaban Passed - 06/17/2022  5:12 PM      Passed - PLT in normal range and within 360 days    Platelets  Date Value Ref Range Status  04/18/2022 280 150 - 400 K/uL Final  11/01/2021 305 150 - 450 x10E3/uL Final         Passed - HGB in normal range and within 360 days    Hemoglobin  Date Value Ref Range Status  04/18/2022 13.8 12.0 - 15.0 g/dL Final  21/30/8657 84.6 11.1 - 15.9 g/dL Final         Passed - HCT in normal range and within 360 days    HCT  Date Value Ref Range Status  04/18/2022 41.0 36.0 - 46.0 % Final   Hematocrit  Date Value Ref Range Status  11/01/2021 46.0 34.0 - 46.6 % Final         Passed - Cr in normal range and within 360 days    Creatinine  Date Value Ref Range Status  09/16/2013 0.99 0.60 - 1.30 mg/dL Final   Creatinine, Ser  Date Value Ref Range Status  04/19/2022 0.88 0.44 - 1.00 mg/dL Final         Passed - AST in normal range and within 360 days    AST  Date Value Ref Range Status  04/15/2022 20 15 - 41 U/L Final   SGOT(AST)  Date Value Ref Range Status  09/16/2013 21 15 - 37 Unit/L Final         Passed - ALT in normal range and within 360 days    ALT  Date Value Ref Range Status  04/15/2022 21 0 - 44 U/L Final   SGPT (ALT)  Date Value Ref Range Status  09/16/2013 19 U/L Final    Comment:    14-63 NOTE: New Reference Range 08/26/13          Passed - Valid encounter within last 12 months    Recent Outpatient Visits           7 months ago Essential hypertension   [facility] Storm Frisk, MD       Future Appointments             Today [facility]             carvedilol (COREG) 12.5 MG tablet 180 tablet 0     Sig: Take 1 tablet (12.5 mg total) by mouth 2 (two) times daily with a meal.     Cardiovascular: Beta Blockers 3 Failed - 06/17/2022  5:12 PM      Failed - Last BP in normal range    BP Readings from Last 1 Encounters:  05/15/22 (!) 125/100         Failed - Valid encounter within last 6 months    Recent Outpatient Visits           7 months ago Essential hypertension   [facility] Storm Frisk, MD       Future Appointments             Today [facility]            Passed - Cr in normal range and within 360 days    Creatinine  Date Value Ref Range Status  09/16/2013 0.99 0.60 - 1.30 mg/dL Final   Creatinine, Ser  Date Value Ref Range Status  04/19/2022 0.88 0.44 - 1.00 mg/dL Final         Passed - AST in normal range and within 360 days    AST  Date Value Ref Range Status  04/15/2022 20 15 - 41 U/L Final   SGOT(AST)  Date Value Ref Range Status  09/16/2013 21 15 - 37 Unit/L Final         Passed - ALT in normal range and within 360 days    ALT  Date Value Ref Range Status  04/15/2022 21 0 - 44 U/L Final   SGPT (ALT)  Date Value Ref Range Status  09/16/2013 19 U/L Final    Comment:    14-63 NOTE: New Reference Range 08/26/13          Passed - Last Heart Rate in normal range    Pulse Readings from Last 1 Encounters:  05/15/22 63

## 2022-06-20 ENCOUNTER — Other Ambulatory Visit: Payer: Self-pay

## 2022-06-21 ENCOUNTER — Other Ambulatory Visit (HOSPITAL_COMMUNITY): Payer: Self-pay

## 2022-06-22 ENCOUNTER — Other Ambulatory Visit: Payer: Self-pay

## 2022-06-22 ENCOUNTER — Other Ambulatory Visit: Payer: Self-pay | Admitting: Critical Care Medicine

## 2022-06-23 ENCOUNTER — Other Ambulatory Visit (HOSPITAL_COMMUNITY): Payer: Self-pay

## 2022-06-23 ENCOUNTER — Other Ambulatory Visit: Payer: Self-pay

## 2022-06-23 MED ORDER — DICLOFENAC SODIUM 1 % EX GEL
2.0000 g | Freq: Four times a day (QID) | CUTANEOUS | 1 refills | Status: DC
  Filled 2022-06-23 – 2022-08-09 (×2): qty 100, 13d supply, fill #0
  Filled 2022-09-04: qty 100, 13d supply, fill #1

## 2022-06-23 NOTE — Telephone Encounter (Signed)
Requested Prescriptions  Pending Prescriptions Disp Refills   diclofenac Sodium (VOLTAREN) 1 % GEL 100 g 1    Sig: Apply 2 g topically 4 (four) times daily. To painful joints     Analgesics:  Topicals Failed - 06/22/2022  7:34 PM      Failed - Manual Review: Labs are only required if the patient has taken medication for more than 8 weeks.      Passed - PLT in normal range and within 360 days    Platelets  Date Value Ref Range Status  04/18/2022 280 150 - 400 K/uL Final  11/01/2021 305 150 - 450 x10E3/uL Final         Passed - HGB in normal range and within 360 days    Hemoglobin  Date Value Ref Range Status  04/18/2022 13.8 12.0 - 15.0 g/dL Final  81/19/1478 29.5 11.1 - 15.9 g/dL Final         Passed - HCT in normal range and within 360 days    HCT  Date Value Ref Range Status  04/18/2022 41.0 36.0 - 46.0 % Final   Hematocrit  Date Value Ref Range Status  11/01/2021 46.0 34.0 - 46.6 % Final         Passed - Cr in normal range and within 360 days    Creatinine  Date Value Ref Range Status  09/16/2013 0.99 0.60 - 1.30 mg/dL Final   Creatinine, Ser  Date Value Ref Range Status  04/19/2022 0.88 0.44 - 1.00 mg/dL Final         Passed - eGFR is 30 or above and within 360 days    EGFR (African American)  Date Value Ref Range Status  09/16/2013 >60  Final   GFR calc Af Amer  Date Value Ref Range Status  09/15/2019 88 >59 mL/min/1.73 Final    Comment:    **[facility] currently reports eGFR in compliance with the current**   recommendations of the [employer]. [facility] will   update reporting as new guidelines are published from the NKF-ASN   Task force.    EGFR (Non-African Amer.)  Date Value Ref Range Status  09/16/2013 >60  Final    Comment:    eGFR values <66mL/min/1.73 m2 may be an indication of chronic kidney disease (CKD). Calculated eGFR is useful in patients with stable renal function. The eGFR calculation will not be reliable in acutely ill  patients when serum creatinine is changing rapidly. It is not useful in  patients on dialysis. The eGFR calculation may not be applicable to patients at the low and high extremes of body sizes, pregnant women, and vegetarians.    GFR, Estimated  Date Value Ref Range Status  04/19/2022 >60 >60 mL/min Final    Comment:    (NOTE) Calculated using the CKD-EPI Creatinine Equation (2021)    eGFR  Date Value Ref Range Status  11/01/2021 94 >59 mL/min/1.73 Final         Passed - Patient is not pregnant      Passed - Valid encounter within last 12 months    Recent Outpatient Visits           7 months ago Essential hypertension   [facility] Storm Frisk, MD

## 2022-06-24 ENCOUNTER — Other Ambulatory Visit (HOSPITAL_COMMUNITY): Payer: Self-pay

## 2022-06-27 ENCOUNTER — Other Ambulatory Visit (HOSPITAL_COMMUNITY): Payer: Self-pay

## 2022-06-27 ENCOUNTER — Other Ambulatory Visit: Payer: Self-pay

## 2022-06-29 ENCOUNTER — Ambulatory Visit (HOSPITAL_COMMUNITY): Payer: Self-pay | Admitting: Clinical

## 2022-06-29 ENCOUNTER — Telehealth (HOSPITAL_COMMUNITY): Payer: Self-pay | Admitting: Clinical

## 2022-06-29 ENCOUNTER — Encounter (HOSPITAL_COMMUNITY): Payer: Self-pay

## 2022-06-29 NOTE — Telephone Encounter (Signed)
Therapist sent the client a link for the scheduled video appt for a new patient assessment. Client did not check in using the link. Therapist attempted to call the client using the number on file. Client phone said "unable to complete call as dialed". Therapist was unable to leave a message.

## 2022-07-10 ENCOUNTER — Ambulatory Visit: Payer: Self-pay

## 2022-07-10 ENCOUNTER — Ambulatory Visit: Payer: Medicaid Other | Attending: Family Medicine | Admitting: Family Medicine

## 2022-07-10 ENCOUNTER — Ambulatory Visit: Payer: Medicaid Other | Admitting: Family Medicine

## 2022-07-10 NOTE — Telephone Encounter (Signed)
     Chief Complaint: Headaches and dizziness. None today. Cancelled appointment today. Asking to be worked in as soon as possible. Symptoms: Above Frequency: 2 weeks ago Pertinent Negatives: Patient denies any symptoms now Disposition: [] ED /[] Urgent Care (no appt availability in office) / [] Appointment(In office/virtual)/ []  [facility]/ [] Home Care/ [] Refused Recommended Disposition /[] [place] [facility]/ [x]  Follow-up with PCP Additional Notes: Instructed to go to ED if symptoms come back. Please advise pt.  Answer Assessment - Initial Assessment Questions 1. LOCATION: "Where does it hurt?"      Left side of head and down neck 2. ONSET: "When did the headache start?" (Minutes, hours or days)      2 weeks ago 3. PATTERN: "Does the pain come and go, or has it been constant since it started?"     Comes and goes 4. SEVERITY: "How bad is the pain?" and "What does it keep you from doing?"  (e.g., Scale 1-10; mild, moderate, or severe)   - MILD (1-3): doesn't interfere with normal activities    - MODERATE (4-7): interferes with normal activities or awakens from sleep    - SEVERE (8-10): excruciating pain, unable to do any normal activities        Now - none 5. RECURRENT SYMPTOM: "Have you ever had headaches before?" If Yes, ask: "When was the last time?" and "What happened that time?"      Yes 6. CAUSE: "What do you think is causing the headache?"     Unsure 7. MIGRAINE: "Have you been diagnosed with migraine headaches?" If Yes, ask: "Is this headache similar?"      No 8. HEAD INJURY: "Has there been any recent injury to the head?"      No 9. OTHER SYMPTOMS: "Do you have any other symptoms?" (fever, stiff neck, eye pain, sore throat, cold symptoms)     Dizzy, unbalanced 10. PREGNANCY: "Is there any chance you are pregnant?" "When was your last menstrual period?"       No  Protocols used: Headache-A-AH

## 2022-07-11 NOTE — Telephone Encounter (Signed)
Call placed to patient unable to reach unable to leave message no VM.

## 2022-07-17 ENCOUNTER — Other Ambulatory Visit: Payer: Self-pay

## 2022-08-09 ENCOUNTER — Other Ambulatory Visit (HOSPITAL_COMMUNITY): Payer: Self-pay

## 2022-08-09 ENCOUNTER — Other Ambulatory Visit: Payer: Self-pay

## 2022-08-11 ENCOUNTER — Other Ambulatory Visit: Payer: Self-pay

## 2022-08-11 ENCOUNTER — Ambulatory Visit: Payer: Self-pay

## 2022-08-11 ENCOUNTER — Other Ambulatory Visit (HOSPITAL_COMMUNITY): Payer: Self-pay

## 2022-08-11 MED ORDER — ALBUTEROL SULFATE (2.5 MG/3ML) 0.083% IN NEBU
2.5000 mg | INHALATION_SOLUTION | Freq: Four times a day (QID) | RESPIRATORY_TRACT | 1 refills | Status: DC | PRN
  Filled 2022-08-11: qty 180, 15d supply, fill #0
  Filled 2022-08-11: qty 150, 13d supply, fill #0

## 2022-08-11 MED ORDER — BENZONATATE 100 MG PO CAPS
100.0000 mg | ORAL_CAPSULE | Freq: Two times a day (BID) | ORAL | 0 refills | Status: DC | PRN
  Filled 2022-08-11 (×2): qty 20, 10d supply, fill #0

## 2022-08-11 NOTE — Telephone Encounter (Signed)
Rx sent no opiate sent

## 2022-08-11 NOTE — Telephone Encounter (Signed)
      Chief Complaint: Non-productive cough, SOB. No availability in the practice. Asking for nebulizer medication. Has a nebulizer at home. Asking for Tramadol and gabapentin for back pain as well. Symptoms: Above, wheezing at times. Frequency: This week Pertinent Negatives: Patient denies  Disposition: [] ED /[] Urgent Care (no appt availability in office) / [] Appointment(In office/virtual)/ []  [place] [facility]/ [] Home Care/ [] Refused Recommended Disposition /[] [facility]/ [x]  Follow-up with PCP Additional Notes: Will go to ED for worsening of symptoms. Can come in to the practice this afternoon. Please advise pt.  Reason for Disposition  [1] MILD difficulty breathing (e.g., minimal/no SOB at rest, SOB with walking, pulse <100) AND [2] still present when not coughing  Answer Assessment - Initial Assessment Questions 1. ONSET: "When did the cough begin?"      This week 2. SEVERITY: "How bad is the cough today?"      Severe 3. SPUTUM: "Describe the color of your sputum" (none, dry cough; clear, white, yellow, green)     N/a 4. HEMOPTYSIS: "Are you coughing up any blood?" If so ask: "How much?" (flecks, streaks, tablespoons, etc.)     No 5. DIFFICULTY BREATHING: "Are you having difficulty breathing?" If Yes, ask: "How bad is it?" (e.g., mild, moderate, severe)    - MILD: No SOB at rest, mild SOB with walking, speaks normally in sentences, can lie down, no retractions, pulse < 100.    - MODERATE: SOB at rest, SOB with minimal exertion and prefers to sit, cannot lie down flat, speaks in phrases, mild retractions, audible wheezing, pulse 100-120.    - SEVERE: Very SOB at rest, speaks in single words, struggling to breathe, sitting hunched forward, retractions, pulse > 120      Moderate 6. FEVER: "Do you have a fever?" If Yes, ask: "What is your temperature, how was it measured, and when did it start?"     No 7. CARDIAC HISTORY: "Do you have any history of heart disease?"  (e.g., heart attack, congestive heart failure)      No 8. LUNG HISTORY: "Do you have any history of lung disease?"  (e.g., pulmonary embolus, asthma, emphysema)     COPD 9. PE RISK FACTORS: "Do you have a history of blood clots?" (or: recent major surgery, recent prolonged travel, bedridden)     No 10. OTHER SYMPTOMS: "Do you have any other symptoms?" (e.g., runny nose, wheezing, chest pain)       SOB, wheezing 11. PREGNANCY: "Is there any chance you are pregnant?" "When was your last menstrual period?"       No 12. TRAVEL: "Have you traveled out of the country in the last month?" (e.g., travel history, exposures)       No  Protocols used: Cough - Acute Non-Productive-A-AH

## 2022-08-11 NOTE — Telephone Encounter (Signed)
Routing to PCP for review, patient is requesting medication.

## 2022-08-11 NOTE — Telephone Encounter (Signed)
I cannot prescribe opiates. She can take tylenol two 500mg  4 times daily

## 2022-08-11 NOTE — Telephone Encounter (Signed)
Pt called and informed of medication being sent, pt states that she is having very bad back pain and is requesting medication for pain

## 2022-08-14 ENCOUNTER — Ambulatory Visit (HOSPITAL_COMMUNITY): Payer: Medicaid Other | Admitting: Clinical

## 2022-08-16 ENCOUNTER — Inpatient Hospital Stay: Payer: Medicaid Other | Admitting: Critical Care Medicine

## 2022-08-16 NOTE — Progress Notes (Deleted)
Patient Office Visit  Subjective    Patient ID: Catherine Kane, female    DOB: 02-29-1970  Age: 52 y.o. MRN: [id number]  CC:  No chief complaint on file.   HPI 10/2021 Catherine Kane presents to establish care This is a 52 year old female former patient of patient care center but she cannot get an appointment there so she establishes here.  On arrival blood pressure 174/128.  She is out of all medications including all of her blood pressure medicines.  She has longstanding history of hypertension morbid obesity asthmatic COPD condition but no longer smoking.  She also is premenopausal with hot flashes and mood swings.  She has been on Prozac in the past which is controlled the symptoms she is out of this medication.  She has a dry cough used to be on an albuterol inhaler and is seen pulmonary previously.  She used to work for [employer] for [employer] but has lost her job 2 years ago.  She has chronic knee and hip pain.  She is in need of a Pap smear.  Patient also requesting gynecology referral.  Patient has been on prednisone with response in the past and also has taken Singulair with improvement.  Also has reflux symptoms would benefit from a PPI. Note patient has sheltered homelessness living in a motel with her husband  08/16/22 Last seen Sept 2023 Adm 04/2022 Admit date: 04/13/2022 Discharge date: 04/19/2022   Admitted From: Home Disposition: Home with outpatient therapies   Recommendations for Outpatient Follow-up:  Follow up with PCP in 1-2 weeks     Home Health: Outpatient PT OT Equipment/Devices: Walker   Discharge Condition: Stable CODE STATUS: Full code Diet recommendation: Low-salt diet   Discharge summary: PMH of HTN, migraine, anxiety, asthma, basal deficiency, vitamin D deficiency, substance abuse.  To the hospital with complaints of right-sided weakness and headache. Originally presented on 04/11/2022 to ED with headache ongoing for 3  days with vomiting.  CT of the head was unremarkable.  Treated symptomatically with improvement in symptoms.  Was discharged on oral Zofran. Return on 3/07-2022 with complaints of right side of the face ear and neck hurting with right ear muffled feeling.  Had right TM erythema with bulging concerning for possible infection treated with Augmentin.  COVID flu and RSV was negative.  Omnicef was sent by PCP outpatient but patient left AMA. Return to ER on 3/7 due to persistent right-sided numbness.  Now admitted with a diagnosis of SAH as well as dural venous sinus thrombosis.  Initially in the ICU.  Neurology consulted.  Started on IV heparin.   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 Presented with recurrent headache likely going since the beginning of the March. Found to have occlusive thrombus in the right transverse and sigmoid sinuses as well as near occlusive thrombus extending from vertex superior sagittal sinus to torcula. CT angio negative for any large vessel occlusion. MRI brain shows venous infarct. 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 Neurology was consulted. Patient was started on IV heparin as the risk for progressive decline without anticoagulation outweighed the risk for worsening SAH per neurology.  Now on Eliquis.   Still has mild headache but resolution of photophobia. Continue Fioricet and tylenol as needed. Currently on Keppra for seizure prophylaxis for 7 days.  Last day 3/15.   Hypercoagulable state. Patient presented with dural venous sinus thrombosis. Workup initiated for hypercoagulable state. So far workup is unremarkable and  etiology is unclear. CT chest abdomen pelvis negative for any malignant site or any other source of thromboembolism. Reported to have some right ear infection although suspect that his swelling and erythema in the setting of venous sinus thrombosis. Patient is positive for cocaine. No other fever chills or  any other infectious symptoms reported. No other hardware reported by the patient. No prior history of any inflammatory condition. COVID is negative.   Hypertensive emergency. Blood pressure was elevated. Resume Norvasc, Coreg, Diovan and hydrochlorothiazide.  Now is stable.   GERD. Continue PPI.   Hypokalemia. Replaced and adequate.   Mood disorder. On Prozac. Continue. On chlorphentermine for allergies.   HLD. On Lipitor 40 mg daily. Continue.   Substance abuse. Patient is cocaine positive. Counseled the patient to avoid any substance abuse in future.   Class 3 obesity Body mass index is 41.47 kg/m.  Placing the patient at high risk of poor outcome.   Severe B12 deficiency.  B12 levels 139. Patient received 3 days of B12 injection, will discharge with oral supplement.   Patient was waiting in the hospital to go to inpatient rehab, however she did good clinical recovery and currently able to go home with family.  She will do outpatient therapies.       Discharge Diagnoses:  Principal Problem:   Stroke (cerebrum) [facility])   Outpatient Encounter Medications as of 08/16/2022  Medication Sig   acetaminophen (TYLENOL) 325 MG tablet Take 2 tablets (650 mg total) by mouth every 6 (six) hours as needed for headache (1st line). (Patient not taking: Reported on 05/15/2022)   albuterol (PROVENTIL) (2.5 MG/3ML) 0.083% nebulizer solution Take 3 mLs (2.5 mg total) by nebulization every 6 (six) hours as needed for wheezing or shortness of breath.   albuterol (VENTOLIN HFA) 108 (90 Base) MCG/ACT inhaler Inhale 2 puffs into the lungs every 6 (six) hours as needed for wheezing or shortness of breath.   amLODipine (NORVASC) 10 MG tablet Take 1 tablet (10 mg total) by mouth daily.   apixaban (ELIQUIS) 5 MG TABS tablet Take 1 tablet (5 mg total) by mouth 2 (two) times daily.   atorvastatin (LIPITOR) 40 MG tablet Take 1 tablet (40 mg total) by mouth daily.   benzonatate (TESSALON) 100 MG  capsule Take 1 capsule (100 mg total) by mouth 2 (two) times daily as needed for cough.   butalbital-acetaminophen-caffeine (FIORICET) 50-325-40 MG tablet Take 1 tablet by mouth every 4 (four) hours as needed for headache or migraine.   carvedilol (COREG) 12.5 MG tablet Take 1 tablet (12.5 mg total) by mouth 2 (two) times daily with a meal.   chlorpheniramine (CHLOR-TRIMETON) 4 MG tablet Take 4 mg by mouth every 4 (four) hours as needed for allergies.   diclofenac Sodium (VOLTAREN) 1 % GEL Apply 2 g topically 4 (four) times daily to painful joints   docusate sodium (COLACE) 100 MG capsule Take 1 capsule (100 mg total) by mouth daily.   ergocalciferol (VITAMIN D2) 1.25 MG (50000 UT) capsule Take 1 capsule (50,000 Units total) by mouth every 7 (seven) days.   Ferrous Sulfate (IRON) 325 (65 Fe) MG TABS Take 1 tablet (325 mg total) by mouth daily.   FLUoxetine (PROZAC) 40 MG capsule Take 1 capsule (40 mg total) by mouth daily.   levETIRAcetam (KEPPRA) 500 MG tablet Take 1 tablet (500 mg total) by mouth 2 (two) times daily for 2 days.   montelukast (SINGULAIR) 10 MG tablet Take 1 tablet (10 mg total) by mouth at  bedtime.   ondansetron (ZOFRAN) 4 MG tablet Take 1 tablet (4 mg total) by mouth every 8 (eight) hours as needed for nausea or vomiting.   pantoprazole (PROTONIX) 40 MG tablet Take 1 tablet (40 mg total) by mouth daily. Take 30-60 minutes before the first meal of the day.   traZODone (DESYREL) 100 MG tablet Take 1 tablet (100 mg total) by mouth at bedtime. (Patient taking differently: Take 100 mg by mouth at bedtime as needed for sleep.)   valsartan-hydrochlorothiazide (DIOVAN-HCT) 320-25 MG tablet Take 1 tablet by mouth daily.   No facility-administered encounter medications on file as of 08/16/2022.    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    Vitamin B12 deficiency 09/2019   Vitamin D deficiency 10/2018   Well woman exam with routine gynecological exam 11/26/2018    Past Surgical History:  Procedure Laterality Date   TONSILLECTOMY     TUBAL LIGATION      Family History  Problem Relation Age of Onset   Heart disease Mother    Heart disease Sister    Breast cancer Maternal Aunt    Breast cancer Maternal Grandmother     Social History   Socioeconomic History   Marital status: Single    Spouse name: Not on file   Number of children: Not on file   Years of education: Not on file   Highest education level: 12th grade  Occupational History   Not on file  Tobacco Use   Smoking status: Never   Smokeless tobacco: Never  Vaping Use   Vaping Use: Never used  Substance and Sexual Activity   Alcohol use: Yes    Alcohol/week: 1.0 standard drink of alcohol    Types: 1 Glasses of wine per week    Comment: one glass once a month   Drug use: No   Sexual activity: Yes    Birth control/protection: Surgical  Other Topics Concern   Not on file  Social History Narrative   Not on file   Social Determinants of Health   Financial Resource Strain: Medium Risk (05/22/2022)   Overall Financial Resource Strain (CARDIA)    Difficulty of Paying Living Expenses: Somewhat hard  Food Insecurity: Food Insecurity Present (05/22/2022)   Hunger Vital Sign    Worried About Running Out of Food in the Last Year: Sometimes true    Ran Out of Food in the Last Year: Sometimes true  Transportation Needs: No Transportation Needs (05/22/2022)   PRAPARE - Administrator, Civil Service (Medical): No    Lack of Transportation (Non-Medical): No  Physical Activity: Unknown (05/22/2022)   Exercise Vital Sign    Days of Exercise per Week: 0 days    Minutes of Exercise per Session: Not on file  Stress: Stress Concern Present (05/22/2022)   [employer] of Occupational Health - Occupational Stress Questionnaire    Feeling of Stress :  Very much  Social Connections: Moderately Integrated (05/22/2022)   Social Connection and Isolation Panel [NHANES]    Frequency of Communication with Friends and Family: More than three times a week    Frequency of Social Gatherings with Friends and Family: More than three times a week    Attends Religious Services: More than 4 times per year    Active Member of [employer] or Organizations:  No    Attends Banker Meetings: Not on file    Marital Status: Living with partner  Intimate Partner Violence: Not At Risk (04/17/2022)   Humiliation, Afraid, Rape, and Kick questionnaire    Fear of Current or Ex-Partner: No    Emotionally Abused: No    Physically Abused: No    Sexually Abused: No    Review of Systems  Constitutional:  Negative for chills, diaphoresis, fever, malaise/fatigue and weight loss.  HENT:  Negative for congestion, hearing loss, nosebleeds, sore throat and tinnitus.   Eyes:  Negative for blurred vision, photophobia and redness.  Respiratory:  Positive for cough, shortness of breath and wheezing. Negative for hemoptysis, sputum production and stridor.   Cardiovascular:  Negative for chest pain, palpitations, orthopnea, claudication, leg swelling and PND.  Gastrointestinal:  Negative for abdominal pain, blood in stool, constipation, diarrhea, heartburn, nausea and vomiting.  Genitourinary:  Negative for dysuria, flank pain, frequency, hematuria and urgency.  Musculoskeletal:  Negative for back pain, falls, joint pain, myalgias and neck pain.  Skin:  Negative for itching and rash.  Neurological:  Negative for dizziness, tingling, tremors, sensory change, speech change, focal weakness, seizures, loss of consciousness, weakness and headaches.  Endo/Heme/Allergies:  Negative for environmental allergies and polydipsia. Does not bruise/bleed easily.  Psychiatric/Behavioral:  Negative for memory loss, substance abuse and suicidal ideas. The patient is nervous/anxious. The  patient does not have insomnia.         Objective    There were no vitals taken for this visit.  Physical Exam Vitals reviewed.  Constitutional:      Appearance: Normal appearance. She is well-developed. She is obese. She is not diaphoretic.  HENT:     Head: Normocephalic and atraumatic.     Nose: No nasal deformity, septal deviation, mucosal edema or rhinorrhea.     Right Sinus: No maxillary sinus tenderness or frontal sinus tenderness.     Left Sinus: No maxillary sinus tenderness or frontal sinus tenderness.     Mouth/Throat:     Pharynx: No oropharyngeal exudate.  Eyes:     General: No scleral icterus.    Conjunctiva/sclera: Conjunctivae normal.     Pupils: Pupils are equal, round, and reactive to light.  Neck:     Thyroid: No thyromegaly.     Vascular: No carotid bruit or JVD.     Trachea: Trachea normal. No tracheal tenderness or tracheal deviation.  Cardiovascular:     Rate and Rhythm: Normal rate and regular rhythm.     Chest Wall: PMI is not displaced.     Pulses: Normal pulses. No decreased pulses.     Heart sounds: Normal heart sounds, S1 normal and S2 normal. Heart sounds not distant. No murmur heard.    No systolic murmur is present.     No diastolic murmur is present.     No friction rub. No gallop. No S3 or S4 sounds.  Pulmonary:     Effort: Pulmonary effort is normal. No tachypnea, accessory muscle usage or respiratory distress.     Breath sounds: No stridor. Wheezing present. No decreased breath sounds, rhonchi or rales.  Chest:     Chest wall: No tenderness.  Abdominal:     General: Bowel sounds are normal. There is no distension.     Palpations: Abdomen is soft. Abdomen is not rigid.     Tenderness: There is no abdominal tenderness. There is no guarding or rebound.  Musculoskeletal:  General: Normal range of motion.     Cervical back: Normal range of motion and neck supple. No edema, erythema or rigidity. No muscular tenderness. Normal range of  motion.  Lymphadenopathy:     Head:     Right side of head: No submental or submandibular adenopathy.     Left side of head: No submental or submandibular adenopathy.     Cervical: No cervical adenopathy.  Skin:    General: Skin is warm and dry.     Coloration: Skin is not pale.     Findings: No rash.     Nails: There is no clubbing.  Neurological:     Mental Status: She is alert and oriented to person, place, and time.     Sensory: No sensory deficit.  Psychiatric:        Speech: Speech normal.        Behavior: Behavior normal.         Assessment & Plan:   Problem List Items Addressed This Visit   None  No follow-ups on file.   Shan Levans, MD

## 2022-08-21 ENCOUNTER — Other Ambulatory Visit (HOSPITAL_COMMUNITY): Payer: Self-pay

## 2022-08-25 ENCOUNTER — Other Ambulatory Visit (HOSPITAL_COMMUNITY): Payer: Self-pay

## 2022-08-29 ENCOUNTER — Other Ambulatory Visit (HOSPITAL_COMMUNITY): Payer: Self-pay

## 2022-08-30 ENCOUNTER — Other Ambulatory Visit (HOSPITAL_COMMUNITY): Payer: Self-pay

## 2022-09-01 ENCOUNTER — Ambulatory Visit (HOSPITAL_COMMUNITY): Payer: Medicaid Other | Admitting: Clinical

## 2022-09-01 ENCOUNTER — Encounter (HOSPITAL_COMMUNITY): Payer: Self-pay

## 2022-09-04 ENCOUNTER — Other Ambulatory Visit (HOSPITAL_COMMUNITY): Payer: Self-pay

## 2022-09-04 ENCOUNTER — Other Ambulatory Visit: Payer: Self-pay | Admitting: Critical Care Medicine

## 2022-09-05 ENCOUNTER — Other Ambulatory Visit: Payer: Self-pay | Admitting: Critical Care Medicine

## 2022-09-05 ENCOUNTER — Other Ambulatory Visit (HOSPITAL_COMMUNITY): Payer: Self-pay

## 2022-09-05 NOTE — Telephone Encounter (Signed)
Requested by interface surescripts. Last dispensed 08/09/22. Too soon for refill. No refills remain future visit in 1 week. Requested Prescriptions  Refused Prescriptions Disp Refills   atorvastatin (LIPITOR) 40 MG tablet 30 tablet 0    Sig: Take 1 tablet (40 mg total) by mouth daily.     Cardiovascular:  Antilipid - Statins Failed - 09/04/2022  3:10 PM      Failed - Lipid Panel in normal range within the last 12 months    Cholesterol, Total  Date Value Ref Range Status  11/01/2021 172 100 - 199 mg/dL Final   Cholesterol  Date Value Ref Range Status  04/15/2022 154 0 - 200 mg/dL Final   LDL Chol Calc (NIH)  Date Value Ref Range Status  11/01/2021 113 (H) 0 - 99 mg/dL Final   LDL Cholesterol  Date Value Ref Range Status  04/15/2022 93 0 - 99 mg/dL Final    Comment:           Total Cholesterol/HDL:CHD Risk Coronary Heart Disease Risk Table                     Men   Women  1/2 Average Risk   3.4   3.3  Average Risk       5.0   4.4  2 X Average Risk   9.6   7.1  3 X Average Risk  23.4   11.0        Use the calculated Patient Ratio above and the CHD Risk Table to determine the patient's CHD Risk.        ATP III CLASSIFICATION (LDL):  <100     mg/dL   Optimal  093-235  mg/dL   Near or Above                    Optimal  130-159  mg/dL   Borderline  573-220  mg/dL   High  >254     mg/dL   Very High Performed at [facility], [address] [place], [place] [address]    HDL  Date Value Ref Range Status  04/15/2022 44 >40 mg/dL Final  37/62/8315 48 >17 mg/dL Final   Triglycerides  Date Value Ref Range Status  04/15/2022 86 <150 mg/dL Final         Passed - Patient is not pregnant      Passed - Valid encounter within last 12 months    Recent Outpatient Visits           10 months ago Essential hypertension   [facility] Storm Frisk, MD       Future Appointments             In 1 week Storm Frisk, MD [facility]

## 2022-09-06 ENCOUNTER — Other Ambulatory Visit: Payer: Self-pay

## 2022-09-06 MED ORDER — ATORVASTATIN CALCIUM 40 MG PO TABS
40.0000 mg | ORAL_TABLET | Freq: Every day | ORAL | 0 refills | Status: DC
  Filled 2022-09-06: qty 30, 30d supply, fill #0

## 2022-09-10 NOTE — Progress Notes (Deleted)
New Patient Office Visit  Subjective    Patient ID: Catherine Kane, female    DOB: 1970/03/28  Age: 52 y.o. MRN: [id number]  CC:  No chief complaint on file.   HPI 10/2021 Catherine Kane presents to establish care This is a 52 year old female former patient of patient care center but she cannot get an appointment there so she establishes here.  On arrival blood pressure 174/128.  She is out of all medications including all of her blood pressure medicines.  She has longstanding history of hypertension morbid obesity asthmatic COPD condition but no longer smoking.  She also is premenopausal with hot flashes and mood swings.  She has been on Prozac in the past which is controlled the symptoms she is out of this medication.  She has a dry cough used to be on an albuterol inhaler and is seen pulmonary previously.  She used to work for [employer] for [employer] but has lost her job 2 years ago.  She has chronic knee and hip pain.  She is in need of a Pap smear.  Patient also requesting gynecology referral.  Patient has been on prednisone with response in the past and also has taken Singulair with improvement.  Also has reflux symptoms would benefit from a PPI. Note patient has sheltered homelessness living in a motel with her husband  8/6 Admit date: 04/13/2022 Discharge date: 04/19/2022   Admitted From: Home Disposition: Home with outpatient therapies   Recommendations for Outpatient Follow-up:  Follow up with PCP in 1-2 weeks     Home Health: Outpatient PT OT Equipment/Devices: Walker   Discharge Condition: Stable CODE STATUS: Full code Diet recommendation: Low-salt diet   Discharge summary: PMH of HTN, migraine, anxiety, asthma, basal deficiency, vitamin D deficiency, substance abuse.  To the hospital with complaints of right-sided weakness and headache. Originally presented on 04/11/2022 to ED with headache ongoing for 3 days with vomiting.  CT of the  head was unremarkable.  Treated symptomatically with improvement in symptoms.  Was discharged on oral Zofran. Return on 3/07-2022 with complaints of right side of the face ear and neck hurting with right ear muffled feeling.  Had right TM erythema with bulging concerning for possible infection treated with Augmentin.  COVID flu and RSV was negative.  Omnicef was sent by PCP outpatient but patient left AMA. Return to ER on 3/7 due to persistent right-sided numbness.  Now admitted with a diagnosis of SAH as well as dural venous sinus thrombosis.  Initially in the ICU.  Neurology consulted.  Started on IV heparin.   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 Presented with recurrent headache likely going since the beginning of the March. Found to have occlusive thrombus in the right transverse and sigmoid sinuses as well as near occlusive thrombus extending from vertex superior sagittal sinus to torcula. CT angio negative for any large vessel occlusion. MRI brain shows venous infarct. 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 Neurology was consulted. Patient was started on IV heparin as the risk for progressive decline without anticoagulation outweighed the risk for worsening SAH per neurology.  Now on Eliquis.   Still has mild headache but resolution of photophobia. Continue Fioricet and tylenol as needed. Currently on Keppra for seizure prophylaxis for 7 days.  Last day 3/15.   Hypercoagulable state. Patient presented with dural venous sinus thrombosis. Workup initiated for hypercoagulable state. So far workup is unremarkable and etiology is unclear. CT chest  abdomen pelvis negative for any malignant site or any other source of thromboembolism. Reported to have some right ear infection although suspect that his swelling and erythema in the setting of venous sinus thrombosis. Patient is positive for cocaine. No other fever chills or any other infectious symptoms  reported. No other hardware reported by the patient. No prior history of any inflammatory condition. COVID is negative.   Hypertensive emergency. Blood pressure was elevated. Resume Norvasc, Coreg, Diovan and hydrochlorothiazide.  Now is stable.   GERD. Continue PPI.   Hypokalemia. Replaced and adequate.   Mood disorder. On Prozac. Continue. On chlorphentermine for allergies.   HLD. On Lipitor 40 mg daily. Continue.   Substance abuse. Patient is cocaine positive. Counseled the patient to avoid any substance abuse in future.   Class 3 obesity Body mass index is 41.47 kg/m.  Placing the patient at high risk of poor outcome.   Severe B12 deficiency.  B12 levels 139. Patient received 3 days of B12 injection, will discharge with oral supplement.   Patient was waiting in the hospital to go to inpatient rehab, however she did good clinical recovery and currently able to go home with family.  She will do outpatient therapies.       Outpatient Encounter Medications as of 09/12/2022  Medication Sig   acetaminophen (TYLENOL) 325 MG tablet Take 2 tablets (650 mg total) by mouth every 6 (six) hours as needed for headache (1st line). (Patient not taking: Reported on 05/15/2022)   albuterol (PROVENTIL) (2.5 MG/3ML) 0.083% nebulizer solution Take 3 mLs (2.5 mg total) by nebulization every 6 (six) hours as needed for wheezing or shortness of breath.   albuterol (VENTOLIN HFA) 108 (90 Base) MCG/ACT inhaler Inhale 2 puffs into the lungs every 6 (six) hours as needed for wheezing or shortness of breath.   amLODipine (NORVASC) 10 MG tablet Take 1 tablet (10 mg total) by mouth daily.   apixaban (ELIQUIS) 5 MG TABS tablet Take 1 tablet (5 mg total) by mouth 2 (two) times daily.   atorvastatin (LIPITOR) 40 MG tablet Take 1 tablet (40 mg total) by mouth daily.   benzonatate (TESSALON) 100 MG capsule Take 1 capsule (100 mg total) by mouth 2 (two) times daily as needed for cough.    butalbital-acetaminophen-caffeine (FIORICET) 50-325-40 MG tablet Take 1 tablet by mouth every 4 (four) hours as needed for headache or migraine.   carvedilol (COREG) 12.5 MG tablet Take 1 tablet (12.5 mg total) by mouth 2 (two) times daily with a meal.   chlorpheniramine (CHLOR-TRIMETON) 4 MG tablet Take 4 mg by mouth every 4 (four) hours as needed for allergies.   diclofenac Sodium (VOLTAREN) 1 % GEL Apply 2 g topically 4 (four) times daily to painful joints   docusate sodium (COLACE) 100 MG capsule Take 1 capsule (100 mg total) by mouth daily.   ergocalciferol (VITAMIN D2) 1.25 MG (50000 UT) capsule Take 1 capsule (50,000 Units total) by mouth every 7 (seven) days.   Ferrous Sulfate (IRON) 325 (65 Fe) MG TABS Take 1 tablet (325 mg total) by mouth daily.   FLUoxetine (PROZAC) 40 MG capsule Take 1 capsule (40 mg total) by mouth daily.   levETIRAcetam (KEPPRA) 500 MG tablet Take 1 tablet (500 mg total) by mouth 2 (two) times daily for 2 days.   montelukast (SINGULAIR) 10 MG tablet Take 1 tablet (10 mg total) by mouth at bedtime.   ondansetron (ZOFRAN) 4 MG tablet Take 1 tablet (4 mg total) by mouth every  8 (eight) hours as needed for nausea or vomiting.   pantoprazole (PROTONIX) 40 MG tablet Take 1 tablet (40 mg total) by mouth daily. Take 30-60 minutes before the first meal of the day.   traZODone (DESYREL) 100 MG tablet Take 1 tablet (100 mg total) by mouth at bedtime. (Patient taking differently: Take 100 mg by mouth at bedtime as needed for sleep.)   valsartan-hydrochlorothiazide (DIOVAN-HCT) 320-25 MG tablet Take 1 tablet by mouth daily.   No facility-administered encounter medications on file as of 09/12/2022.    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   Vitamin B12 deficiency 09/2019   Vitamin D deficiency 10/2018   Well woman exam with routine  gynecological exam 11/26/2018    Past Surgical History:  Procedure Laterality Date   TONSILLECTOMY     TUBAL LIGATION      Family History  Problem Relation Age of Onset   Heart disease Mother    Heart disease Sister    Breast cancer Maternal Aunt    Breast cancer Maternal Grandmother     Social History   Socioeconomic History   Marital status: Single    Spouse name: Not on file   Number of children: Not on file   Years of education: Not on file   Highest education level: 12th grade  Occupational History   Not on file  Tobacco Use   Smoking status: Never   Smokeless tobacco: Never  Vaping Use   Vaping status: Never Used  Substance and Sexual Activity   Alcohol use: Yes    Alcohol/week: 1.0 standard drink of alcohol    Types: 1 Glasses of wine per week    Comment: one glass once a month   Drug use: No   Sexual activity: Yes    Birth control/protection: Surgical  Other Topics Concern   Not on file  Social History Narrative   Not on file   Social Determinants of Health   Financial Resource Strain: Medium Risk (05/22/2022)   Overall Financial Resource Strain (CARDIA)    Difficulty of Paying Living Expenses: Somewhat hard  Food Insecurity: Food Insecurity Present (05/22/2022)   Hunger Vital Sign    Worried About Running Out of Food in the Last Year: Sometimes true    Ran Out of Food in the Last Year: Sometimes true  Transportation Needs: No Transportation Needs (05/22/2022)   PRAPARE - Administrator, Civil Service (Medical): No    Lack of Transportation (Non-Medical): No  Physical Activity: Unknown (05/22/2022)   Exercise Vital Sign    Days of Exercise per Week: 0 days    Minutes of Exercise per Session: Not on file  Stress: Stress Concern Present (05/22/2022)   [employer] of Occupational Health - Occupational Stress Questionnaire    Feeling of Stress : Very much  Social Connections: Moderately Integrated (05/22/2022)   Social Connection and  Isolation Panel [NHANES]    Frequency of Communication with Friends and Family: More than three times a week    Frequency of Social Gatherings with Friends and Family: More than three times a week    Attends Religious Services: More than 4 times per year    Active Member of [employer] or Organizations: No    Attends Banker Meetings: Not on file    Marital Status:  Living with partner  Intimate Partner Violence: Not At Risk (04/17/2022)   Humiliation, Afraid, Rape, and Kick questionnaire    Fear of Current or Ex-Partner: No    Emotionally Abused: No    Physically Abused: No    Sexually Abused: No    Review of Systems  Constitutional:  Negative for chills, diaphoresis, fever, malaise/fatigue and weight loss.  HENT:  Negative for congestion, hearing loss, nosebleeds, sore throat and tinnitus.   Eyes:  Negative for blurred vision, photophobia and redness.  Respiratory:  Positive for cough, shortness of breath and wheezing. Negative for hemoptysis, sputum production and stridor.   Cardiovascular:  Negative for chest pain, palpitations, orthopnea, claudication, leg swelling and PND.  Gastrointestinal:  Negative for abdominal pain, blood in stool, constipation, diarrhea, heartburn, nausea and vomiting.  Genitourinary:  Negative for dysuria, flank pain, frequency, hematuria and urgency.  Musculoskeletal:  Negative for back pain, falls, joint pain, myalgias and neck pain.  Skin:  Negative for itching and rash.  Neurological:  Negative for dizziness, tingling, tremors, sensory change, speech change, focal weakness, seizures, loss of consciousness, weakness and headaches.  Endo/Heme/Allergies:  Negative for environmental allergies and polydipsia. Does not bruise/bleed easily.  Psychiatric/Behavioral:  Negative for memory loss, substance abuse and suicidal ideas. The patient is nervous/anxious. The patient does not have insomnia.         Objective    There were no vitals taken for this  visit.  Physical Exam Vitals reviewed.  Constitutional:      Appearance: Normal appearance. She is well-developed. She is obese. She is not diaphoretic.  HENT:     Head: Normocephalic and atraumatic.     Nose: No nasal deformity, septal deviation, mucosal edema or rhinorrhea.     Right Sinus: No maxillary sinus tenderness or frontal sinus tenderness.     Left Sinus: No maxillary sinus tenderness or frontal sinus tenderness.     Mouth/Throat:     Pharynx: No oropharyngeal exudate.  Eyes:     General: No scleral icterus.    Conjunctiva/sclera: Conjunctivae normal.     Pupils: Pupils are equal, round, and reactive to light.  Neck:     Thyroid: No thyromegaly.     Vascular: No carotid bruit or JVD.     Trachea: Trachea normal. No tracheal tenderness or tracheal deviation.  Cardiovascular:     Rate and Rhythm: Normal rate and regular rhythm.     Chest Wall: PMI is not displaced.     Pulses: Normal pulses. No decreased pulses.     Heart sounds: Normal heart sounds, S1 normal and S2 normal. Heart sounds not distant. No murmur heard.    No systolic murmur is present.     No diastolic murmur is present.     No friction rub. No gallop. No S3 or S4 sounds.  Pulmonary:     Effort: Pulmonary effort is normal. No tachypnea, accessory muscle usage or respiratory distress.     Breath sounds: No stridor. Wheezing present. No decreased breath sounds, rhonchi or rales.  Chest:     Chest wall: No tenderness.  Abdominal:     General: Bowel sounds are normal. There is no distension.     Palpations: Abdomen is soft. Abdomen is not rigid.     Tenderness: There is no abdominal tenderness. There is no guarding or rebound.  Musculoskeletal:        General: Normal range of motion.     Cervical back: Normal range of motion and neck  supple. No edema, erythema or rigidity. No muscular tenderness. Normal range of motion.  Lymphadenopathy:     Head:     Right side of head: No submental or submandibular  adenopathy.     Left side of head: No submental or submandibular adenopathy.     Cervical: No cervical adenopathy.  Skin:    General: Skin is warm and dry.     Coloration: Skin is not pale.     Findings: No rash.     Nails: There is no clubbing.  Neurological:     Mental Status: She is alert and oriented to person, place, and time.     Sensory: No sensory deficit.  Psychiatric:        Speech: Speech normal.        Behavior: Behavior normal.         Assessment & Plan:   Problem List Items Addressed This Visit   None    No follow-ups on file.   Shan Levans, MD

## 2022-09-11 ENCOUNTER — Ambulatory Visit: Payer: Medicaid Other | Admitting: Neurology

## 2022-09-11 ENCOUNTER — Telehealth: Payer: Self-pay | Admitting: Neurology

## 2022-09-11 NOTE — Telephone Encounter (Signed)
Pt cancelled appt due to no transportation, daughter could not pick up her due to still at work.

## 2022-09-12 ENCOUNTER — Inpatient Hospital Stay: Payer: Medicaid Other | Admitting: Critical Care Medicine

## 2022-09-15 ENCOUNTER — Inpatient Hospital Stay: Payer: Medicaid Other | Admitting: Critical Care Medicine

## 2022-09-15 NOTE — Progress Notes (Deleted)
New Patient Office Visit  Subjective    Patient ID: Catherine Kane, female    DOB: [birth year]  Age: 52 y.o. MRN: [id number]  CC:  No chief complaint on file.   HPI 10/2021 Catherine Kane presents to establish care This is a 52 year old female former patient of patient care center but she cannot get an appointment there so she establishes here.  On arrival blood pressure 174/128.  She is out of all medications including all of her blood pressure medicines.  She has longstanding history of hypertension morbid obesity asthmatic COPD condition but no longer smoking.  She also is premenopausal with hot flashes and mood swings.  She has been on Prozac in the past which is controlled the symptoms she is out of this medication.  She has a dry cough used to be on an albuterol inhaler and is seen pulmonary previously.  She used to work for [employer] for [employer] but has lost her job 2 years ago.  She has chronic knee and hip pain.  She is in need of a Pap smear.  Patient also requesting gynecology referral.  Patient has been on prednisone with response in the past and also has taken Singulair with improvement.  Also has reflux symptoms would benefit from a PPI. Note patient has sheltered homelessness living in a motel with her husband  8/6 Admit date: 04/13/2022 Discharge date: 04/19/2022   Admitted From: Home Disposition: Home with outpatient therapies   Recommendations for Outpatient Follow-up:  Follow up with PCP in 1-2 weeks     Home Health: Outpatient PT OT Equipment/Devices: Walker   Discharge Condition: Stable CODE STATUS: Full code Diet recommendation: Low-salt diet   Discharge summary: PMH of HTN, migraine, anxiety, asthma, basal deficiency, vitamin D deficiency, substance abuse.  To the hospital with complaints of right-sided weakness and headache. Originally presented on 04/11/2022 to ED with headache ongoing for 3 days with vomiting.  CT of the  head was unremarkable.  Treated symptomatically with improvement in symptoms.  Was discharged on oral Zofran. Return on 3/07-2022 with complaints of right side of the face ear and neck hurting with right ear muffled feeling.  Had right TM erythema with bulging concerning for possible infection treated with Augmentin.  COVID flu and RSV was negative.  Omnicef was sent by PCP outpatient but patient left AMA. Return to ER on 3/7 due to persistent right-sided numbness.  Now admitted with a diagnosis of SAH as well as dural venous sinus thrombosis.  Initially in the ICU.  Neurology consulted.  Started on IV heparin.   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 Presented with recurrent headache likely going since the beginning of the March. Found to have occlusive thrombus in the right transverse and sigmoid sinuses as well as near occlusive thrombus extending from vertex superior sagittal sinus to torcula. CT angio negative for any large vessel occlusion. MRI brain shows venous infarct. 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 Neurology was consulted. Patient was started on IV heparin as the risk for progressive decline without anticoagulation outweighed the risk for worsening SAH per neurology.  Now on Eliquis.   Still has mild headache but resolution of photophobia. Continue Fioricet and tylenol as needed. Currently on Keppra for seizure prophylaxis for 7 days.  Last day 3/15.   Hypercoagulable state. Patient presented with dural venous sinus thrombosis. Workup initiated for hypercoagulable state. So far workup is unremarkable and etiology is unclear. CT chest  abdomen pelvis negative for any malignant site or any other source of thromboembolism. Reported to have some right ear infection although suspect that his swelling and erythema in the setting of venous sinus thrombosis. Patient is positive for cocaine. No other fever chills or any other infectious symptoms  reported. No other hardware reported by the patient. No prior history of any inflammatory condition. COVID is negative.   Hypertensive emergency. Blood pressure was elevated. Resume Norvasc, Coreg, Diovan and hydrochlorothiazide.  Now is stable.   GERD. Continue PPI.   Hypokalemia. Replaced and adequate.   Mood disorder. On Prozac. Continue. On chlorphentermine for allergies.   HLD. On Lipitor 40 mg daily. Continue.   Substance abuse. Patient is cocaine positive. Counseled the patient to avoid any substance abuse in future.   Class 3 obesity Body mass index is 41.47 kg/m.  Placing the patient at high risk of poor outcome.   Severe B12 deficiency.  B12 levels 139. Patient received 3 days of B12 injection, will discharge with oral supplement.   Patient was waiting in the hospital to go to inpatient rehab, however she did good clinical recovery and currently able to go home with family.  She will do outpatient therapies.    09/15/22   Outpatient Encounter Medications as of 09/15/2022  Medication Sig   acetaminophen (TYLENOL) 325 MG tablet Take 2 tablets (650 mg total) by mouth every 6 (six) hours as needed for headache (1st line). (Patient not taking: Reported on 05/15/2022)   albuterol (PROVENTIL) (2.5 MG/3ML) 0.083% nebulizer solution Take 3 mLs (2.5 mg total) by nebulization every 6 (six) hours as needed for wheezing or shortness of breath.   albuterol (VENTOLIN HFA) 108 (90 Base) MCG/ACT inhaler Inhale 2 puffs into the lungs every 6 (six) hours as needed for wheezing or shortness of breath.   amLODipine (NORVASC) 10 MG tablet Take 1 tablet (10 mg total) by mouth daily.   apixaban (ELIQUIS) 5 MG TABS tablet Take 1 tablet (5 mg total) by mouth 2 (two) times daily.   atorvastatin (LIPITOR) 40 MG tablet Take 1 tablet (40 mg total) by mouth daily.   benzonatate (TESSALON) 100 MG capsule Take 1 capsule (100 mg total) by mouth 2 (two) times daily as needed for cough.    butalbital-acetaminophen-caffeine (FIORICET) 50-325-40 MG tablet Take 1 tablet by mouth every 4 (four) hours as needed for headache or migraine.   carvedilol (COREG) 12.5 MG tablet Take 1 tablet (12.5 mg total) by mouth 2 (two) times daily with a meal.   chlorpheniramine (CHLOR-TRIMETON) 4 MG tablet Take 4 mg by mouth every 4 (four) hours as needed for allergies.   diclofenac Sodium (VOLTAREN) 1 % GEL Apply 2 g topically 4 (four) times daily to painful joints   docusate sodium (COLACE) 100 MG capsule Take 1 capsule (100 mg total) by mouth daily.   ergocalciferol (VITAMIN D2) 1.25 MG (50000 UT) capsule Take 1 capsule (50,000 Units total) by mouth every 7 (seven) days.   Ferrous Sulfate (IRON) 325 (65 Fe) MG TABS Take 1 tablet (325 mg total) by mouth daily.   FLUoxetine (PROZAC) 40 MG capsule Take 1 capsule (40 mg total) by mouth daily.   levETIRAcetam (KEPPRA) 500 MG tablet Take 1 tablet (500 mg total) by mouth 2 (two) times daily for 2 days.   montelukast (SINGULAIR) 10 MG tablet Take 1 tablet (10 mg total) by mouth at bedtime.   ondansetron (ZOFRAN) 4 MG tablet Take 1 tablet (4 mg total) by mouth every  8 (eight) hours as needed for nausea or vomiting.   pantoprazole (PROTONIX) 40 MG tablet Take 1 tablet (40 mg total) by mouth daily. Take 30-60 minutes before the first meal of the day.   traZODone (DESYREL) 100 MG tablet Take 1 tablet (100 mg total) by mouth at bedtime. (Patient taking differently: Take 100 mg by mouth at bedtime as needed for sleep.)   valsartan-hydrochlorothiazide (DIOVAN-HCT) 320-25 MG tablet Take 1 tablet by mouth daily.   No facility-administered encounter medications on file as of 09/15/2022.    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   Vitamin B12 deficiency 09/2019   Vitamin D deficiency 10/2018   Well woman exam with routine  gynecological exam 11/26/2018    Past Surgical History:  Procedure Laterality Date   TONSILLECTOMY     TUBAL LIGATION      Family History  Problem Relation Age of Onset   Heart disease Mother    Heart disease Sister    Breast cancer Maternal Aunt    Breast cancer Maternal Grandmother     Social History   Socioeconomic History   Marital status: Single    Spouse name: Not on file   Number of children: Not on file   Years of education: Not on file   Highest education level: 12th grade  Occupational History   Not on file  Tobacco Use   Smoking status: Never   Smokeless tobacco: Never  Vaping Use   Vaping status: Never Used  Substance and Sexual Activity   Alcohol use: Yes    Alcohol/week: 1.0 standard drink of alcohol    Types: 1 Glasses of wine per week    Comment: one glass once a month   Drug use: No   Sexual activity: Yes    Birth control/protection: Surgical  Other Topics Concern   Not on file  Social History Narrative   Not on file   Social Determinants of Health   Financial Resource Strain: Medium Risk (05/22/2022)   Overall Financial Resource Strain (CARDIA)    Difficulty of Paying Living Expenses: Somewhat hard  Food Insecurity: Food Insecurity Present (05/22/2022)   Hunger Vital Sign    Worried About Running Out of Food in the Last Year: Sometimes true    Ran Out of Food in the Last Year: Sometimes true  Transportation Needs: No Transportation Needs (05/22/2022)   PRAPARE - Administrator, Civil Service (Medical): No    Lack of Transportation (Non-Medical): No  Physical Activity: Unknown (05/22/2022)   Exercise Vital Sign    Days of Exercise per Week: 0 days    Minutes of Exercise per Session: Not on file  Stress: Stress Concern Present (05/22/2022)   [employer] of Occupational Health - Occupational Stress Questionnaire    Feeling of Stress : Very much  Social Connections: Moderately Integrated (05/22/2022)   Social Connection and  Isolation Panel [NHANES]    Frequency of Communication with Friends and Family: More than three times a week    Frequency of Social Gatherings with Friends and Family: More than three times a week    Attends Religious Services: More than 4 times per year    Active Member of [employer] or Organizations: No    Attends Banker Meetings: Not on file    Marital Status:  Living with partner  Intimate Partner Violence: Not At Risk (04/17/2022)   Humiliation, Afraid, Rape, and Kick questionnaire    Fear of Current or Ex-Partner: No    Emotionally Abused: No    Physically Abused: No    Sexually Abused: No    Review of Systems  Constitutional:  Negative for chills, diaphoresis, fever, malaise/fatigue and weight loss.  HENT:  Negative for congestion, hearing loss, nosebleeds, sore throat and tinnitus.   Eyes:  Negative for blurred vision, photophobia and redness.  Respiratory:  Positive for cough, shortness of breath and wheezing. Negative for hemoptysis, sputum production and stridor.   Cardiovascular:  Negative for chest pain, palpitations, orthopnea, claudication, leg swelling and PND.  Gastrointestinal:  Negative for abdominal pain, blood in stool, constipation, diarrhea, heartburn, nausea and vomiting.  Genitourinary:  Negative for dysuria, flank pain, frequency, hematuria and urgency.  Musculoskeletal:  Negative for back pain, falls, joint pain, myalgias and neck pain.  Skin:  Negative for itching and rash.  Neurological:  Negative for dizziness, tingling, tremors, sensory change, speech change, focal weakness, seizures, loss of consciousness, weakness and headaches.  Endo/Heme/Allergies:  Negative for environmental allergies and polydipsia. Does not bruise/bleed easily.  Psychiatric/Behavioral:  Negative for memory loss, substance abuse and suicidal ideas. The patient is nervous/anxious. The patient does not have insomnia.         Objective    There were no vitals taken for this  visit.  Physical Exam Vitals reviewed.  Constitutional:      Appearance: Normal appearance. She is well-developed. She is obese. She is not diaphoretic.  HENT:     Head: Normocephalic and atraumatic.     Nose: No nasal deformity, septal deviation, mucosal edema or rhinorrhea.     Right Sinus: No maxillary sinus tenderness or frontal sinus tenderness.     Left Sinus: No maxillary sinus tenderness or frontal sinus tenderness.     Mouth/Throat:     Pharynx: No oropharyngeal exudate.  Eyes:     General: No scleral icterus.    Conjunctiva/sclera: Conjunctivae normal.     Pupils: Pupils are equal, round, and reactive to light.  Neck:     Thyroid: No thyromegaly.     Vascular: No carotid bruit or JVD.     Trachea: Trachea normal. No tracheal tenderness or tracheal deviation.  Cardiovascular:     Rate and Rhythm: Normal rate and regular rhythm.     Chest Wall: PMI is not displaced.     Pulses: Normal pulses. No decreased pulses.     Heart sounds: Normal heart sounds, S1 normal and S2 normal. Heart sounds not distant. No murmur heard.    No systolic murmur is present.     No diastolic murmur is present.     No friction rub. No gallop. No S3 or S4 sounds.  Pulmonary:     Effort: Pulmonary effort is normal. No tachypnea, accessory muscle usage or respiratory distress.     Breath sounds: No stridor. Wheezing present. No decreased breath sounds, rhonchi or rales.  Chest:     Chest wall: No tenderness.  Abdominal:     General: Bowel sounds are normal. There is no distension.     Palpations: Abdomen is soft. Abdomen is not rigid.     Tenderness: There is no abdominal tenderness. There is no guarding or rebound.  Musculoskeletal:        General: Normal range of motion.     Cervical back: Normal range of motion and neck  supple. No edema, erythema or rigidity. No muscular tenderness. Normal range of motion.  Lymphadenopathy:     Head:     Right side of head: No submental or submandibular  adenopathy.     Left side of head: No submental or submandibular adenopathy.     Cervical: No cervical adenopathy.  Skin:    General: Skin is warm and dry.     Coloration: Skin is not pale.     Findings: No rash.     Nails: There is no clubbing.  Neurological:     Mental Status: She is alert and oriented to person, place, and time.     Sensory: No sensory deficit.  Psychiatric:        Speech: Speech normal.        Behavior: Behavior normal.         Assessment & Plan:   Problem List Items Addressed This Visit   None    No follow-ups on file.   Shan Levans, MD

## 2022-10-02 ENCOUNTER — Other Ambulatory Visit: Payer: Self-pay

## 2022-10-02 ENCOUNTER — Other Ambulatory Visit (HOSPITAL_COMMUNITY): Payer: Self-pay

## 2022-10-02 ENCOUNTER — Other Ambulatory Visit: Payer: Self-pay | Admitting: Critical Care Medicine

## 2022-10-02 DIAGNOSIS — G4700 Insomnia, unspecified: Secondary | ICD-10-CM

## 2022-10-02 MED ORDER — TRAZODONE HCL 100 MG PO TABS
100.0000 mg | ORAL_TABLET | Freq: Every day | ORAL | 0 refills | Status: DC
  Filled 2022-10-02: qty 30, 30d supply, fill #0

## 2022-10-02 MED ORDER — DICLOFENAC SODIUM 1 % EX GEL
2.0000 g | Freq: Four times a day (QID) | CUTANEOUS | 0 refills | Status: DC
  Filled 2022-10-02: qty 100, 13d supply, fill #0

## 2022-10-02 MED ORDER — ATORVASTATIN CALCIUM 40 MG PO TABS
40.0000 mg | ORAL_TABLET | Freq: Every day | ORAL | 0 refills | Status: DC
  Filled 2022-10-02: qty 30, 30d supply, fill #0

## 2022-10-03 ENCOUNTER — Other Ambulatory Visit (HOSPITAL_COMMUNITY): Payer: Self-pay

## 2022-10-10 ENCOUNTER — Encounter: Payer: Self-pay | Admitting: Pharmacist

## 2022-10-11 ENCOUNTER — Ambulatory Visit: Payer: Medicaid Other | Attending: Critical Care Medicine | Admitting: Critical Care Medicine

## 2022-10-11 NOTE — Progress Notes (Deleted)
New Patient Office Visit  Subjective    Patient ID: Catherine Kane, female    DOB: 06/27/1970  Age: 52 y.o. MRN: [id number]  CC:  No chief complaint on file.   HPI 10/2021 Catherine Kane presents to establish care This is a 52 year old female former patient of patient care center but she cannot get an appointment there so she establishes here.  On arrival blood pressure 174/128.  She is out of all medications including all of her blood pressure medicines.  She has longstanding history of hypertension morbid obesity asthmatic COPD condition but no longer smoking.  She also is premenopausal with hot flashes and mood swings.  She has been on Prozac in the past which is controlled the symptoms she is out of this medication.  She has a dry cough used to be on an albuterol inhaler and is seen pulmonary previously.  She used to work for [employer] for [employer] but has lost her job 2 years ago.  She has chronic knee and hip pain.  She is in need of a Pap smear.  Patient also requesting gynecology referral.  Patient has been on prednisone with response in the past and also has taken Singulair with improvement.  Also has reflux symptoms would benefit from a PPI. Note patient has sheltered homelessness living in a motel with her husband  9/4 Not seen since 10/2021 Did not make prior Toc appts  Admit date: 04/13/2022 Discharge date: 04/19/2022   Admitted From: Home Disposition: Home with outpatient therapies   Recommendations for Outpatient Follow-up:  Follow up with PCP in 1-2 weeks     Home Health: Outpatient PT OT Equipment/Devices: Walker   Discharge Condition: Stable CODE STATUS: Full code Diet recommendation: Low-salt diet   Discharge summary: PMH of HTN, migraine, anxiety, asthma, basal deficiency, vitamin D deficiency, substance abuse.  To the hospital with complaints of right-sided weakness and headache. Originally presented on 04/11/2022 to ED with  headache ongoing for 3 days with vomiting.  CT of the head was unremarkable.  Treated symptomatically with improvement in symptoms.  Was discharged on oral Zofran. Return on 3/07-2022 with complaints of right side of the face ear and neck hurting with right ear muffled feeling.  Had right TM erythema with bulging concerning for possible infection treated with Augmentin.  COVID flu and RSV was negative.  Omnicef was sent by PCP outpatient but patient left AMA. Return to ER on 3/7 due to persistent right-sided numbness.  Now admitted with a diagnosis of SAH as well as dural venous sinus thrombosis.  Initially in the ICU.  Neurology consulted.  Started on IV heparin.   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 Presented with recurrent headache likely going since the beginning of the March. Found to have occlusive thrombus in the right transverse and sigmoid sinuses as well as near occlusive thrombus extending from vertex superior sagittal sinus to torcula. CT angio negative for any large vessel occlusion. MRI brain shows venous infarct. 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 Neurology was consulted. Patient was started on IV heparin as the risk for progressive decline without anticoagulation outweighed the risk for worsening SAH per neurology.  Now on Eliquis.   Still has mild headache but resolution of photophobia. Continue Fioricet and tylenol as needed. Currently on Keppra for seizure prophylaxis for 7 days.  Last day 3/15.   Hypercoagulable state. Patient presented with dural venous sinus thrombosis. Workup initiated for hypercoagulable state.  So far workup is unremarkable and etiology is unclear. CT chest abdomen pelvis negative for any malignant site or any other source of thromboembolism. Reported to have some right ear infection although suspect that his swelling and erythema in the setting of venous sinus thrombosis. Patient is positive for cocaine. No  other fever chills or any other infectious symptoms reported. No other hardware reported by the patient. No prior history of any inflammatory condition. COVID is negative.   Hypertensive emergency. Blood pressure was elevated. Resume Norvasc, Coreg, Diovan and hydrochlorothiazide.  Now is stable.   GERD. Continue PPI.   Hypokalemia. Replaced and adequate.   Mood disorder. On Prozac. Continue. On chlorphentermine for allergies.   HLD. On Lipitor 40 mg daily. Continue.   Substance abuse. Patient is cocaine positive. Counseled the patient to avoid any substance abuse in future.   Class 3 obesity Body mass index is 41.47 kg/m.  Placing the patient at high risk of poor outcome.   Severe B12 deficiency.  B12 levels 139. Patient received 3 days of B12 injection, will discharge with oral supplement.   Patient was waiting in the hospital to go to inpatient rehab, however she did good clinical recovery and currently able to go home with family.  She will do outpatient therapies.    09/15/22   Outpatient Encounter Medications as of 10/11/2022  Medication Sig   acetaminophen (TYLENOL) 325 MG tablet Take 2 tablets (650 mg total) by mouth every 6 (six) hours as needed for headache (1st line). (Patient not taking: Reported on 05/15/2022)   albuterol (PROVENTIL) (2.5 MG/3ML) 0.083% nebulizer solution Take 3 mLs (2.5 mg total) by nebulization every 6 (six) hours as needed for wheezing or shortness of breath.   albuterol (VENTOLIN HFA) 108 (90 Base) MCG/ACT inhaler Inhale 2 puffs into the lungs every 6 (six) hours as needed for wheezing or shortness of breath.   amLODipine (NORVASC) 10 MG tablet Take 1 tablet (10 mg total) by mouth daily.   apixaban (ELIQUIS) 5 MG TABS tablet Take 1 tablet (5 mg total) by mouth 2 (two) times daily.   atorvastatin (LIPITOR) 40 MG tablet Take 1 tablet (40 mg total) by mouth daily.   benzonatate (TESSALON) 100 MG capsule Take 1 capsule (100 mg total) by mouth 2  (two) times daily as needed for cough.   butalbital-acetaminophen-caffeine (FIORICET) 50-325-40 MG tablet Take 1 tablet by mouth every 4 (four) hours as needed for headache or migraine.   carvedilol (COREG) 12.5 MG tablet Take 1 tablet (12.5 mg total) by mouth 2 (two) times daily with a meal.   chlorpheniramine (CHLOR-TRIMETON) 4 MG tablet Take 4 mg by mouth every 4 (four) hours as needed for allergies.   diclofenac Sodium (VOLTAREN) 1 % GEL Apply 2 grams topically 4 (four) times daily to painful joints   docusate sodium (COLACE) 100 MG capsule Take 1 capsule (100 mg total) by mouth daily.   ergocalciferol (VITAMIN D2) 1.25 MG (50000 UT) capsule Take 1 capsule (50,000 Units total) by mouth every 7 (seven) days.   Ferrous Sulfate (IRON) 325 (65 Fe) MG TABS Take 1 tablet (325 mg total) by mouth daily.   FLUoxetine (PROZAC) 40 MG capsule Take 1 capsule (40 mg total) by mouth daily.   levETIRAcetam (KEPPRA) 500 MG tablet Take 1 tablet (500 mg total) by mouth 2 (two) times daily for 2 days.   montelukast (SINGULAIR) 10 MG tablet Take 1 tablet (10 mg total) by mouth at bedtime.   ondansetron (ZOFRAN) 4  MG tablet Take 1 tablet (4 mg total) by mouth every 8 (eight) hours as needed for nausea or vomiting.   pantoprazole (PROTONIX) 40 MG tablet Take 1 tablet (40 mg total) by mouth daily. Take 30-60 minutes before the first meal of the day.   traZODone (DESYREL) 100 MG tablet Take 1 tablet (100 mg total) by mouth at bedtime.   valsartan-hydrochlorothiazide (DIOVAN-HCT) 320-25 MG tablet Take 1 tablet by mouth daily.   No facility-administered encounter medications on file as of 10/11/2022.    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   Vitamin B12 deficiency 09/2019   Vitamin D deficiency 10/2018   Well woman exam with routine gynecological exam 11/26/2018    Past  Surgical History:  Procedure Laterality Date   TONSILLECTOMY     TUBAL LIGATION      Family History  Problem Relation Age of Onset   Heart disease Mother    Heart disease Sister    Breast cancer Maternal Aunt    Breast cancer Maternal Grandmother     Social History   Socioeconomic History   Marital status: Single    Spouse name: Not on file   Number of children: Not on file   Years of education: Not on file   Highest education level: 12th grade  Occupational History   Not on file  Tobacco Use   Smoking status: Never   Smokeless tobacco: Never  Vaping Use   Vaping status: Never Used  Substance and Sexual Activity   Alcohol use: Yes    Alcohol/week: 1.0 standard drink of alcohol    Types: 1 Glasses of wine per week    Comment: one glass once a month   Drug use: No   Sexual activity: Yes    Birth control/protection: Surgical  Other Topics Concern   Not on file  Social History Narrative   Not on file   Social Determinants of Health   Financial Resource Strain: Medium Risk (05/22/2022)   Overall Financial Resource Strain (CARDIA)    Difficulty of Paying Living Expenses: Somewhat hard  Food Insecurity: Food Insecurity Present (05/22/2022)   Hunger Vital Sign    Worried About Running Out of Food in the Last Year: Sometimes true    Ran Out of Food in the Last Year: Sometimes true  Transportation Needs: No Transportation Needs (05/22/2022)   PRAPARE - Administrator, Civil Service (Medical): No    Lack of Transportation (Non-Medical): No  Physical Activity: Unknown (05/22/2022)   Exercise Vital Sign    Days of Exercise per Week: 0 days    Minutes of Exercise per Session: Not on file  Stress: Stress Concern Present (05/22/2022)   [employer] of Occupational Health - Occupational Stress Questionnaire    Feeling of Stress : Very much  Social Connections: Moderately Integrated (05/22/2022)   Social Connection and Isolation Panel [NHANES]    Frequency of  Communication with Friends and Family: More than three times a week    Frequency of Social Gatherings with Friends and Family: More than three times a week    Attends Religious Services: More than 4 times per year    Active Member of [employer] or Organizations: No    Attends Banker Meetings: Not on file    Marital Status: Living with partner  Intimate Partner Violence: Not At Risk (04/17/2022)   Humiliation, Afraid, Rape, and Kick questionnaire    Fear of Current or Ex-Partner: No    Emotionally Abused: No    Physically Abused: No    Sexually Abused: No    Review of Systems  Constitutional:  Negative for chills, diaphoresis, fever, malaise/fatigue and weight loss.  HENT:  Negative for congestion, hearing loss, nosebleeds, sore throat and tinnitus.   Eyes:  Negative for blurred vision, photophobia and redness.  Respiratory:  Positive for cough, shortness of breath and wheezing. Negative for hemoptysis, sputum production and stridor.   Cardiovascular:  Negative for chest pain, palpitations, orthopnea, claudication, leg swelling and PND.  Gastrointestinal:  Negative for abdominal pain, blood in stool, constipation, diarrhea, heartburn, nausea and vomiting.  Genitourinary:  Negative for dysuria, flank pain, frequency, hematuria and urgency.  Musculoskeletal:  Negative for back pain, falls, joint pain, myalgias and neck pain.  Skin:  Negative for itching and rash.  Neurological:  Negative for dizziness, tingling, tremors, sensory change, speech change, focal weakness, seizures, loss of consciousness, weakness and headaches.  Endo/Heme/Allergies:  Negative for environmental allergies and polydipsia. Does not bruise/bleed easily.  Psychiatric/Behavioral:  Negative for memory loss, substance abuse and suicidal ideas. The patient is nervous/anxious. The patient does not have insomnia.         Objective    There were no vitals taken for this visit.  Physical Exam Vitals reviewed.   Constitutional:      Appearance: Normal appearance. She is well-developed. She is obese. She is not diaphoretic.  HENT:     Head: Normocephalic and atraumatic.     Nose: No nasal deformity, septal deviation, mucosal edema or rhinorrhea.     Right Sinus: No maxillary sinus tenderness or frontal sinus tenderness.     Left Sinus: No maxillary sinus tenderness or frontal sinus tenderness.     Mouth/Throat:     Pharynx: No oropharyngeal exudate.  Eyes:     General: No scleral icterus.    Conjunctiva/sclera: Conjunctivae normal.     Pupils: Pupils are equal, round, and reactive to light.  Neck:     Thyroid: No thyromegaly.     Vascular: No carotid bruit or JVD.     Trachea: Trachea normal. No tracheal tenderness or tracheal deviation.  Cardiovascular:     Rate and Rhythm: Normal rate and regular rhythm.     Chest Wall: PMI is not displaced.     Pulses: Normal pulses. No decreased pulses.     Heart sounds: Normal heart sounds, S1 normal and S2 normal. Heart sounds not distant. No murmur heard.    No systolic murmur is present.     No diastolic murmur is present.     No friction rub. No gallop. No S3 or S4 sounds.  Pulmonary:     Effort: Pulmonary effort is normal. No tachypnea, accessory muscle usage or respiratory distress.     Breath sounds: No stridor. Wheezing present. No decreased breath sounds, rhonchi or rales.  Chest:     Chest wall: No tenderness.  Abdominal:     General: Bowel sounds are normal. There is no distension.     Palpations: Abdomen is soft. Abdomen is not rigid.     Tenderness: There is no abdominal tenderness. There is no guarding or rebound.  Musculoskeletal:        General: Normal range of motion.     Cervical back: Normal range of motion and neck supple. No edema, erythema  or rigidity. No muscular tenderness. Normal range of motion.  Lymphadenopathy:     Head:     Right side of head: No submental or submandibular adenopathy.     Left side of head: No  submental or submandibular adenopathy.     Cervical: No cervical adenopathy.  Skin:    General: Skin is warm and dry.     Coloration: Skin is not pale.     Findings: No rash.     Nails: There is no clubbing.  Neurological:     Mental Status: She is alert and oriented to person, place, and time.     Sensory: No sensory deficit.  Psychiatric:        Speech: Speech normal.        Behavior: Behavior normal.         Assessment & Plan:   Problem List Items Addressed This Visit   None    No follow-ups on file.   Shan Levans, MD

## 2022-11-03 ENCOUNTER — Other Ambulatory Visit (HOSPITAL_COMMUNITY): Payer: Self-pay

## 2022-11-03 ENCOUNTER — Other Ambulatory Visit: Payer: Self-pay | Admitting: Critical Care Medicine

## 2022-11-03 DIAGNOSIS — E559 Vitamin D deficiency, unspecified: Secondary | ICD-10-CM

## 2022-11-03 DIAGNOSIS — G4700 Insomnia, unspecified: Secondary | ICD-10-CM

## 2022-11-03 DIAGNOSIS — I10 Essential (primary) hypertension: Secondary | ICD-10-CM

## 2022-11-03 DIAGNOSIS — J449 Chronic obstructive pulmonary disease, unspecified: Secondary | ICD-10-CM

## 2022-11-03 DIAGNOSIS — F419 Anxiety disorder, unspecified: Secondary | ICD-10-CM

## 2022-11-03 DIAGNOSIS — R059 Cough, unspecified: Secondary | ICD-10-CM

## 2022-11-03 DIAGNOSIS — R0602 Shortness of breath: Secondary | ICD-10-CM

## 2022-11-03 MED ORDER — ERGOCALCIFEROL 1.25 MG (50000 UT) PO CAPS
1.0000 | ORAL_CAPSULE | ORAL | 0 refills | Status: DC
  Filled 2022-11-03: qty 4, 28d supply, fill #0

## 2022-11-03 MED ORDER — FLUOXETINE HCL 40 MG PO CAPS
40.0000 mg | ORAL_CAPSULE | Freq: Every day | ORAL | 0 refills | Status: DC
Start: 2022-11-03 — End: 2023-01-11
  Filled 2022-11-03: qty 30, 30d supply, fill #0

## 2022-11-03 MED ORDER — PANTOPRAZOLE SODIUM 40 MG PO TBEC
40.0000 mg | DELAYED_RELEASE_TABLET | Freq: Every day | ORAL | 0 refills | Status: DC
  Filled 2022-11-03: qty 30, 30d supply, fill #0

## 2022-11-03 MED ORDER — AMLODIPINE BESYLATE 10 MG PO TABS
10.0000 mg | ORAL_TABLET | Freq: Every day | ORAL | 0 refills | Status: DC
Start: 2022-11-03 — End: 2023-01-11
  Filled 2022-11-03: qty 30, 30d supply, fill #0

## 2022-11-03 MED ORDER — VALSARTAN-HYDROCHLOROTHIAZIDE 320-25 MG PO TABS
1.0000 | ORAL_TABLET | Freq: Every day | ORAL | 0 refills | Status: DC
  Filled 2022-11-03: qty 30, 30d supply, fill #0

## 2022-11-03 MED ORDER — TRAZODONE HCL 100 MG PO TABS
100.0000 mg | ORAL_TABLET | Freq: Every day | ORAL | 0 refills | Status: DC
  Filled 2022-11-03: qty 30, 30d supply, fill #0

## 2022-11-03 MED ORDER — MONTELUKAST SODIUM 10 MG PO TABS
10.0000 mg | ORAL_TABLET | Freq: Every day | ORAL | 0 refills | Status: DC
Start: 2022-11-03 — End: 2023-01-11
  Filled 2022-11-03: qty 30, 30d supply, fill #0

## 2022-11-03 MED ORDER — ATORVASTATIN CALCIUM 40 MG PO TABS
40.0000 mg | ORAL_TABLET | Freq: Every day | ORAL | 0 refills | Status: DC
  Filled 2022-11-03: qty 30, 30d supply, fill #0

## 2022-11-03 NOTE — Telephone Encounter (Signed)
Requested medications are due for refill today.  Yes - all of them  Requested medications are on the active medications list.  yes  Last refill. varied  Future visit scheduled.   yes  Notes to clinic.  Pt only seen 1x in office 11/01/2021. Labs are expired. Pt is more than 3 months overdue for 6 month meds. Please review for refill.    Requested Prescriptions  Pending Prescriptions Disp Refills   traZODone (DESYREL) 100 MG tablet 30 tablet 0    Sig: Take 1 tablet (100 mg total) by mouth at bedtime.     Psychiatry: Antidepressants - Serotonin Modulator Failed - 11/03/2022  9:22 AM      Failed - Valid encounter within last 6 months    Recent Outpatient Visits           1 year ago Essential hypertension   [facility] & [facility] Storm Frisk, MD       Future Appointments             In 1 week Bary [facility] [place]             atorvastatin (LIPITOR) 40 MG tablet 30 tablet 0    Sig: Take 1 tablet (40 mg total) by mouth daily.     Cardiovascular:  Antilipid - Statins Failed - 11/03/2022  9:22 AM      Failed - Valid encounter within last 12 months    Recent Outpatient Visits           1 year ago Essential hypertension   [place] [facility] Storm Frisk, MD       Future Appointments             In 1 week Bary [facility]            Failed - Lipid Panel in normal range within the last 12 months    Cholesterol, Total  Date Value Ref Range Status  11/01/2021 172 100 - 199 mg/dL Final   Cholesterol  Date Value Ref Range Status  04/15/2022 154 0 - 200 mg/dL Final   LDL Chol Calc (NIH)  Date Value Ref Range Status  11/01/2021 113 (H) 0 - 99 mg/dL Final   LDL Cholesterol  Date Value Ref Range Status  04/15/2022 93 0 - 99 mg/dL Final    Comment:           Total Cholesterol/HDL:CHD Risk Coronary  Heart Disease Risk Table                     Men   Women  1/2 Average Risk   3.4   3.3  Average Risk       5.0   4.4  2 X Average Risk   9.6   7.1  3 X Average Risk  23.4   11.0        Use the calculated Patient Ratio above and the CHD Risk Table to determine the patient's CHD Risk.        ATP III CLASSIFICATION (LDL):  <100     mg/dL   Optimal  161-096  mg/dL   Near or Above                    Optimal  130-159  mg/dL   Borderline  045-409  mg/dL  High  >190     mg/dL   Very High Performed at [facility], [address] [place], [place] [address]    HDL  Date Value Ref Range Status  04/15/2022 44 >40 mg/dL Final  40/98/1191 48 >47 mg/dL Final   Triglycerides  Date Value Ref Range Status  04/15/2022 86 <150 mg/dL Final         Passed - Patient is not pregnant       amLODipine (NORVASC) 10 MG tablet 30 tablet 11    Sig: Take 1 tablet (10 mg total) by mouth daily.     Cardiovascular: Calcium Channel Blockers 2 Failed - 11/03/2022  9:22 AM      Failed - Last BP in normal range    BP Readings from Last 1 Encounters:  05/15/22 (!) 125/100         Failed - Valid encounter within last 6 months    Recent Outpatient Visits           1 year ago Essential hypertension   [place] [facility] Storm Frisk, MD       Future Appointments             In 1 week Bary Richard [facility]            Passed - Last Heart Rate in normal range    Pulse Readings from Last 1 Encounters:  05/15/22 63          ergocalciferol (VITAMIN D2) 1.25 MG (50000 UT) capsule 11 capsule 6    Sig: Take 1 capsule (50,000 Units total) by mouth every 7 (seven) days.     Endocrinology:  Vitamins - Vitamin D Supplementation 2 Failed - 11/03/2022  9:22 AM      Failed - Manual Review: Route requests for 50,000 IU strength to the provider      Failed - Vitamin D in normal range and within 360 days    Vit D,  25-Hydroxy  Date Value Ref Range Status  09/15/2019 40.1 30.0 - 100.0 ng/mL Final    Comment:    Vitamin D deficiency has been defined by the Institute of Medicine and an Endocrine Society practice guideline as a level of serum 25-OH vitamin D less than 20 ng/mL (1,2). The Endocrine Society went on to further define vitamin D insufficiency as a level between 21 and 29 ng/mL (2). 1. IOM (Institute of Medicine). 2010. Dietary reference    intakes for calcium and D. [place]: The    [employer]. 2. Holick MF, Binkley Upham, Bischoff-Ferrari HA, et al.    Evaluation, treatment, and prevention of vitamin D    deficiency: an Endocrine Society clinical practice    guideline. JCEM. 2011 Jul; 96(7):1911-30.          Failed - Valid encounter within last 12 months    Recent Outpatient Visits           1 year ago Essential hypertension   [facility] Storm Frisk, MD       Future Appointments             In 1 week [facility] [place] [facility] [place] [facility]            Passed - Ca in normal range and within 360 days    Calcium  Date Value Ref Range  Status  04/19/2022 8.9 8.9 - 10.3 mg/dL Final   Calcium, Total  Date Value Ref Range Status  09/16/2013 8.6 8.5 - 10.1 mg/dL Final   Calcium, Ion  Date Value Ref Range Status  04/13/2022 1.00 (L) 1.15 - 1.40 mmol/L Final          FLUoxetine (PROZAC) 40 MG capsule 30 capsule 11    Sig: Take 1 capsule (40 mg total) by mouth daily.     Psychiatry:  Antidepressants - SSRI Failed - 11/03/2022  9:22 AM      Failed - Valid encounter within last 6 months    Recent Outpatient Visits           1 year ago Essential hypertension   [facility] Storm Frisk, MD       Future Appointments             In 1 week Bary Richard [place] [facility]             montelukast  (SINGULAIR) 10 MG tablet 30 tablet 11    Sig: Take 1 tablet (10 mg total) by mouth at bedtime.     Pulmonology:  Leukotriene Inhibitors Failed - 11/03/2022  9:22 AM      Failed - Valid encounter within last 12 months    Recent Outpatient Visits           1 year ago Essential hypertension   [facility] Storm Frisk, MD       Future Appointments             In 1 week Bary Richard [place] [facility]             pantoprazole (PROTONIX) 40 MG tablet 30 tablet 11    Sig: Take 1 tablet (40 mg total) by mouth daily. Take 30-60 minutes before the first meal of the day.     Gastroenterology: Proton Pump Inhibitors Failed - 11/03/2022  9:22 AM      Failed - Valid encounter within last 12 months    Recent Outpatient Visits           1 year ago Essential hypertension   [facility] Storm Frisk, MD       Future Appointments             In 1 week Bary Richard [place] [facility]             valsartan-hydrochlorothiazide (DIOVAN-HCT) 320-25 MG tablet 60 tablet 3    Sig: Take 1 tablet by mouth daily.     Cardiovascular: ARB + Diuretic Combos Failed - 11/03/2022  9:22 AM      Failed - K in normal range and within 180 days    Potassium  Date Value Ref Range Status  04/19/2022 4.2 3.5 - 5.1 mmol/L Final  09/16/2013 3.5 3.5 - 5.1 mmol/L Final         Failed - Na in normal range and within 180 days    Sodium  Date Value Ref Range Status  04/19/2022 134 (L) 135 - 145 mmol/L Final  11/01/2021 141 134 - 144 mmol/L Final  09/16/2013 142 136 - 145 mmol/L Final         Failed - Cr in normal range and within 180 days  Creatinine  Date Value Ref Range Status  09/16/2013 0.99 0.60 - 1.30 mg/dL Final   Creatinine, Ser  Date Value Ref Range Status  04/19/2022 0.88 0.44 - 1.00 mg/dL Final         Failed - eGFR is 10 or  above and within 180 days    EGFR (African American)  Date Value Ref Range Status  09/16/2013 >60  Final   GFR calc Af Amer  Date Value Ref Range Status  09/15/2019 88 >59 mL/min/1.73 Final    Comment:    **[facility] currently reports eGFR in compliance with the current**   recommendations of the [employer]. [facility] will   update reporting as new guidelines are published from the NKF-ASN   Task force.    EGFR (Non-African Amer.)  Date Value Ref Range Status  09/16/2013 >60  Final    Comment:    eGFR values <47mL/min/1.73 m2 may be an indication of chronic kidney disease (CKD). Calculated eGFR is useful in patients with stable renal function. The eGFR calculation will not be reliable in acutely ill patients when serum creatinine is changing rapidly. It is not useful in  patients on dialysis. The eGFR calculation may not be applicable to patients at the low and high extremes of body sizes, pregnant women, and vegetarians.    GFR, Estimated  Date Value Ref Range Status  04/19/2022 >60 >60 mL/min Final    Comment:    (NOTE) Calculated using the CKD-EPI Creatinine Equation (2021)    eGFR  Date Value Ref Range Status  11/01/2021 94 >59 mL/min/1.73 Final         Failed - Last BP in normal range    BP Readings from Last 1 Encounters:  05/15/22 (!) 125/100         Failed - Valid encounter within last 6 months    Recent Outpatient Visits           1 year ago Essential hypertension   [place] [facility] Storm Frisk, MD       Future Appointments             In 1 week [facility] [place] [facility]            Passed - Patient is not pregnant

## 2022-11-04 ENCOUNTER — Other Ambulatory Visit (HOSPITAL_COMMUNITY): Payer: Self-pay

## 2022-11-16 ENCOUNTER — Ambulatory Visit: Payer: Medicaid Other | Admitting: Physician Assistant

## 2022-11-30 ENCOUNTER — Other Ambulatory Visit: Payer: Self-pay | Admitting: Critical Care Medicine

## 2022-11-30 DIAGNOSIS — I10 Essential (primary) hypertension: Secondary | ICD-10-CM

## 2022-11-30 DIAGNOSIS — F419 Anxiety disorder, unspecified: Secondary | ICD-10-CM

## 2022-11-30 DIAGNOSIS — G4700 Insomnia, unspecified: Secondary | ICD-10-CM

## 2022-11-30 DIAGNOSIS — R059 Cough, unspecified: Secondary | ICD-10-CM

## 2022-11-30 DIAGNOSIS — J449 Chronic obstructive pulmonary disease, unspecified: Secondary | ICD-10-CM

## 2022-11-30 DIAGNOSIS — R0602 Shortness of breath: Secondary | ICD-10-CM

## 2022-12-01 NOTE — Telephone Encounter (Signed)
Requested medication (s) are due for refill today: yes  Requested medication (s) are on the active medication list: yes  Last refill:  multiple dates  Future visit scheduled: no  Notes to clinic:  Unable to refill per protocol, appointment needed. Last several OV has been canceled.     Requested Prescriptions  Pending Prescriptions Disp Refills   apixaban (ELIQUIS) 5 MG TABS tablet 180 tablet 1    Sig: Take 1 tablet (5 mg total) by mouth 2 (two) times daily.     Hematology:  Anticoagulants - apixaban Failed - 11/30/2022  9:44 AM      Failed - Valid encounter within last 12 months    Recent Outpatient Visits           1 year ago Essential hypertension   [facility] Storm Frisk, MD              Passed - PLT in normal range and within 360 days    Platelets  Date Value Ref Range Status  04/18/2022 280 150 - 400 K/uL Final  11/01/2021 305 150 - 450 x10E3/uL Final         Passed - HGB in normal range and within 360 days    Hemoglobin  Date Value Ref Range Status  04/18/2022 13.8 12.0 - 15.0 g/dL Final  29/56/2130 86.5 11.1 - 15.9 g/dL Final         Passed - HCT in normal range and within 360 days    HCT  Date Value Ref Range Status  04/18/2022 41.0 36.0 - 46.0 % Final   Hematocrit  Date Value Ref Range Status  11/01/2021 46.0 34.0 - 46.6 % Final         Passed - Cr in normal range and within 360 days    Creatinine  Date Value Ref Range Status  09/16/2013 0.99 0.60 - 1.30 mg/dL Final   Creatinine, Ser  Date Value Ref Range Status  04/19/2022 0.88 0.44 - 1.00 mg/dL Final         Passed - AST in normal range and within 360 days    AST  Date Value Ref Range Status  04/15/2022 20 15 - 41 U/L Final   SGOT(AST)  Date Value Ref Range Status  09/16/2013 21 15 - 37 Unit/L Final         Passed - ALT in normal range and within 360 days    ALT  Date Value Ref Range Status  04/15/2022 21 0 - 44 U/L Final   SGPT (ALT)   Date Value Ref Range Status  09/16/2013 19 U/L Final    Comment:    14-63 NOTE: New Reference Range 08/26/13           carvedilol (COREG) 12.5 MG tablet 180 tablet 0    Sig: Take 1 tablet (12.5 mg total) by mouth 2 (two) times daily with a meal.     Cardiovascular: Beta Blockers 3 Failed - 11/30/2022  9:44 AM      Failed - Last BP in normal range    BP Readings from Last 1 Encounters:  05/15/22 (!) 125/100         Failed - Valid encounter within last 6 months    Recent Outpatient Visits           1 year ago Essential hypertension   [place] [facility] Storm Frisk, MD  Passed - Cr in normal range and within 360 days    Creatinine  Date Value Ref Range Status  09/16/2013 0.99 0.60 - 1.30 mg/dL Final   Creatinine, Ser  Date Value Ref Range Status  04/19/2022 0.88 0.44 - 1.00 mg/dL Final         Passed - AST in normal range and within 360 days    AST  Date Value Ref Range Status  04/15/2022 20 15 - 41 U/L Final   SGOT(AST)  Date Value Ref Range Status  09/16/2013 21 15 - 37 Unit/L Final         Passed - ALT in normal range and within 360 days    ALT  Date Value Ref Range Status  04/15/2022 21 0 - 44 U/L Final   SGPT (ALT)  Date Value Ref Range Status  09/16/2013 19 U/L Final    Comment:    14-63 NOTE: New Reference Range 08/26/13          Passed - Last Heart Rate in normal range    Pulse Readings from Last 1 Encounters:  05/15/22 63          benzonatate (TESSALON) 100 MG capsule 20 capsule 0    Sig: Take 1 capsule (100 mg total) by mouth 2 (two) times daily as needed for cough.     Ear, Nose, and Throat:  Antitussives/Expectorants Failed - 11/30/2022  9:44 AM      Failed - Valid encounter within last 12 months    Recent Outpatient Visits           1 year ago Essential hypertension   [place] [facility] Storm Frisk, MD               traZODone (DESYREL)  100 MG tablet 30 tablet 0    Sig: Take 1 tablet (100 mg total) by mouth at bedtime.     Psychiatry: Antidepressants - Serotonin Modulator Failed - 11/30/2022  9:44 AM      Failed - Valid encounter within last 6 months    Recent Outpatient Visits           1 year ago Essential hypertension   [place] [facility] Storm Frisk, MD               atorvastatin (LIPITOR) 40 MG tablet 30 tablet 0    Sig: Take 1 tablet (40 mg total) by mouth daily.     Cardiovascular:  Antilipid - Statins Failed - 11/30/2022  9:44 AM      Failed - Valid encounter within last 12 months    Recent Outpatient Visits           1 year ago Essential hypertension   [facility] Storm Frisk, MD              Failed - Lipid Panel in normal range within the last 12 months    Cholesterol, Total  Date Value Ref Range Status  11/01/2021 172 100 - 199 mg/dL Final   Cholesterol  Date Value Ref Range Status  04/15/2022 154 0 - 200 mg/dL Final   LDL Chol Calc (NIH)  Date Value Ref Range Status  11/01/2021 113 (H) 0 - 99 mg/dL Final   LDL Cholesterol  Date Value Ref Range Status  04/15/2022 93 0 - 99 mg/dL Final    Comment:  Total Cholesterol/HDL:CHD Risk Coronary Heart Disease Risk Table                     Men   Women  1/2 Average Risk   3.4   3.3  Average Risk       5.0   4.4  2 X Average Risk   9.6   7.1  3 X Average Risk  23.4   11.0        Use the calculated Patient Ratio above and the CHD Risk Table to determine the patient's CHD Risk.        ATP III CLASSIFICATION (LDL):  <100     mg/dL   Optimal  914-782  mg/dL   Near or Above                    Optimal  130-159  mg/dL   Borderline  956-213  mg/dL   High  >086     mg/dL   Very High Performed at [facility], [address], [place], [place] [address]    HDL  Date Value Ref Range Status  04/15/2022 44 >40 mg/dL Final  96/29/5284 48 >13 mg/dL  Final   Triglycerides  Date Value Ref Range Status  04/15/2022 86 <150 mg/dL Final         Passed - Patient is not pregnant       amLODipine (NORVASC) 10 MG tablet 30 tablet 0    Sig: Take 1 tablet (10 mg total) by mouth daily.     Cardiovascular: Calcium Channel Blockers 2 Failed - 11/30/2022  9:44 AM      Failed - Last BP in normal range    BP Readings from Last 1 Encounters:  05/15/22 (!) 125/100         Failed - Valid encounter within last 6 months    Recent Outpatient Visits           1 year ago Essential hypertension   [place] [facility] Storm Frisk, MD              Passed - Last Heart Rate in normal range    Pulse Readings from Last 1 Encounters:  05/15/22 63          FLUoxetine (PROZAC) 40 MG capsule 30 capsule 0    Sig: Take 1 capsule (40 mg total) by mouth daily.     Psychiatry:  Antidepressants - SSRI Failed - 11/30/2022  9:44 AM      Failed - Valid encounter within last 6 months    Recent Outpatient Visits           1 year ago Essential hypertension   [place] [facility] Storm Frisk, MD               montelukast (SINGULAIR) 10 MG tablet 30 tablet 0    Sig: Take 1 tablet (10 mg total) by mouth at bedtime.     Pulmonology:  Leukotriene Inhibitors Failed - 11/30/2022  9:44 AM      Failed - Valid encounter within last 12 months    Recent Outpatient Visits           1 year ago Essential hypertension   [facility] Storm Frisk, MD               pantoprazole (PROTONIX) 40 MG tablet 30 tablet  0    Sig: Take 1 tablet (40 mg total) by mouth daily. Take 30-60 minutes before the first meal of the day.     Gastroenterology: Proton Pump Inhibitors Failed - 11/30/2022  9:44 AM      Failed - Valid encounter within last 12 months    Recent Outpatient Visits           1 year ago Essential hypertension   [place] [facility] &  [facility] Storm Frisk, MD               valsartan-hydrochlorothiazide (DIOVAN-HCT) 320-25 MG tablet 30 tablet 0    Sig: Take 1 tablet by mouth daily.     Cardiovascular: ARB + Diuretic Combos Failed - 11/30/2022  9:44 AM      Failed - K in normal range and within 180 days    Potassium  Date Value Ref Range Status  04/19/2022 4.2 3.5 - 5.1 mmol/L Final  09/16/2013 3.5 3.5 - 5.1 mmol/L Final         Failed - Na in normal range and within 180 days    Sodium  Date Value Ref Range Status  04/19/2022 134 (L) 135 - 145 mmol/L Final  11/01/2021 141 134 - 144 mmol/L Final  09/16/2013 142 136 - 145 mmol/L Final         Failed - Cr in normal range and within 180 days    Creatinine  Date Value Ref Range Status  09/16/2013 0.99 0.60 - 1.30 mg/dL Final   Creatinine, Ser  Date Value Ref Range Status  04/19/2022 0.88 0.44 - 1.00 mg/dL Final         Failed - eGFR is 10 or above and within 180 days    EGFR (African American)  Date Value Ref Range Status  09/16/2013 >60  Final   GFR calc Af Amer  Date Value Ref Range Status  09/15/2019 88 >59 mL/min/1.73 Final    Comment:    **[facility] currently reports eGFR in compliance with the current**   recommendations of the [employer]. [facility] will   update reporting as new guidelines are published from the NKF-ASN   Task force.    EGFR (Non-African Amer.)  Date Value Ref Range Status  09/16/2013 >60  Final    Comment:    eGFR values <63mL/min/1.73 m2 may be an indication of chronic kidney disease (CKD). Calculated eGFR is useful in patients with stable renal function. The eGFR calculation will not be reliable in acutely ill patients when serum creatinine is changing rapidly. It is not useful in  patients on dialysis. The eGFR calculation may not be applicable to patients at the low and high extremes of body sizes, pregnant women, and vegetarians.    GFR, Estimated  Date Value Ref Range Status   04/19/2022 >60 >60 mL/min Final    Comment:    (NOTE) Calculated using the CKD-EPI Creatinine Equation (2021)    eGFR  Date Value Ref Range Status  11/01/2021 94 >59 mL/min/1.73 Final         Failed - Last BP in normal range    BP Readings from Last 1 Encounters:  05/15/22 (!) 125/100         Failed - Valid encounter within last 6 months    Recent Outpatient Visits           1 year ago Essential hypertension   [place] [facility] & [facility] Storm Frisk, MD  Passed - Patient is not pregnant

## 2022-12-07 ENCOUNTER — Ambulatory Visit: Payer: Medicaid Other | Admitting: Neurology

## 2022-12-07 ENCOUNTER — Encounter: Payer: Self-pay | Admitting: Neurology

## 2022-12-07 ENCOUNTER — Other Ambulatory Visit: Payer: Self-pay | Admitting: Critical Care Medicine

## 2022-12-07 DIAGNOSIS — R0602 Shortness of breath: Secondary | ICD-10-CM

## 2022-12-07 DIAGNOSIS — R059 Cough, unspecified: Secondary | ICD-10-CM

## 2022-12-07 DIAGNOSIS — G4700 Insomnia, unspecified: Secondary | ICD-10-CM

## 2022-12-07 DIAGNOSIS — I10 Essential (primary) hypertension: Secondary | ICD-10-CM

## 2022-12-07 DIAGNOSIS — J449 Chronic obstructive pulmonary disease, unspecified: Secondary | ICD-10-CM

## 2022-12-07 DIAGNOSIS — F419 Anxiety disorder, unspecified: Secondary | ICD-10-CM

## 2022-12-07 NOTE — Telephone Encounter (Signed)
Requested medication (s) are due for refill today:   Yes for all 11  Requested medication (s) are on the active medication list:   Yes  Future visit scheduled:   No   Last time seen was 11/01/2021    She has been a No Show for multiple appts.     Last ordered: Varied  Returned because an OV is due and all labs are expired.   Provider to review   Requested Prescriptions  Pending Prescriptions Disp Refills   apixaban (ELIQUIS) 5 MG TABS tablet 180 tablet 1    Sig: Take 1 tablet (5 mg total) by mouth 2 (two) times daily.     Hematology:  Anticoagulants - apixaban Failed - 12/07/2022 10:59 AM      Failed - Valid encounter within last 12 months    Recent Outpatient Visits           1 year ago Essential hypertension   [place] [facility] Storm Frisk, MD              Passed - PLT in normal range and within 360 days    Platelets  Date Value Ref Range Status  04/18/2022 280 150 - 400 K/uL Final  11/01/2021 305 150 - 450 x10E3/uL Final         Passed - HGB in normal range and within 360 days    Hemoglobin  Date Value Ref Range Status  04/18/2022 13.8 12.0 - 15.0 g/dL Final  71/07/2692 85.4 11.1 - 15.9 g/dL Final         Passed - HCT in normal range and within 360 days    HCT  Date Value Ref Range Status  04/18/2022 41.0 36.0 - 46.0 % Final   Hematocrit  Date Value Ref Range Status  11/01/2021 46.0 34.0 - 46.6 % Final         Passed - Cr in normal range and within 360 days    Creatinine  Date Value Ref Range Status  09/16/2013 0.99 0.60 - 1.30 mg/dL Final   Creatinine, Ser  Date Value Ref Range Status  04/19/2022 0.88 0.44 - 1.00 mg/dL Final         Passed - AST in normal range and within 360 days    AST  Date Value Ref Range Status  04/15/2022 20 15 - 41 U/L Final   SGOT(AST)  Date Value Ref Range Status  09/16/2013 21 15 - 37 Unit/L Final         Passed - ALT in normal range and within 360 days    ALT  Date Value Ref  Range Status  04/15/2022 21 0 - 44 U/L Final   SGPT (ALT)  Date Value Ref Range Status  09/16/2013 19 U/L Final    Comment:    14-63 NOTE: New Reference Range 08/26/13           carvedilol (COREG) 12.5 MG tablet 180 tablet 0    Sig: Take 1 tablet (12.5 mg total) by mouth 2 (two) times daily with a meal.     Cardiovascular: Beta Blockers 3 Failed - 12/07/2022 10:59 AM      Failed - Last BP in normal range    BP Readings from Last 1 Encounters:  05/15/22 (!) 125/100         Failed - Valid encounter within last 6 months    Recent Outpatient Visits           1  year ago Essential hypertension   [place] [facility] & [facility] Storm Frisk, MD              Passed - Cr in normal range and within 360 days    Creatinine  Date Value Ref Range Status  09/16/2013 0.99 0.60 - 1.30 mg/dL Final   Creatinine, Ser  Date Value Ref Range Status  04/19/2022 0.88 0.44 - 1.00 mg/dL Final         Passed - AST in normal range and within 360 days    AST  Date Value Ref Range Status  04/15/2022 20 15 - 41 U/L Final   SGOT(AST)  Date Value Ref Range Status  09/16/2013 21 15 - 37 Unit/L Final         Passed - ALT in normal range and within 360 days    ALT  Date Value Ref Range Status  04/15/2022 21 0 - 44 U/L Final   SGPT (ALT)  Date Value Ref Range Status  09/16/2013 19 U/L Final    Comment:    14-63 NOTE: New Reference Range 08/26/13          Passed - Last Heart Rate in normal range    Pulse Readings from Last 1 Encounters:  05/15/22 63          benzonatate (TESSALON) 100 MG capsule 20 capsule 0    Sig: Take 1 capsule (100 mg total) by mouth 2 (two) times daily as needed for cough.     Ear, Nose, and Throat:  Antitussives/Expectorants Failed - 12/07/2022 10:59 AM      Failed - Valid encounter within last 12 months    Recent Outpatient Visits           1 year ago Essential hypertension   [place] [facility] & [facility]  Storm Frisk, MD               diclofenac Sodium (VOLTAREN) 1 % GEL 100 g 0    Sig: Apply 2 grams topically 4 (four) times daily to painful joints     Analgesics:  Topicals Failed - 12/07/2022 10:59 AM      Failed - Manual Review: Labs are only required if the patient has taken medication for more than 8 weeks.      Failed - Valid encounter within last 12 months    Recent Outpatient Visits           1 year ago Essential hypertension   [place] [facility] Storm Frisk, MD              Passed - PLT in normal range and within 360 days    Platelets  Date Value Ref Range Status  04/18/2022 280 150 - 400 K/uL Final  11/01/2021 305 150 - 450 x10E3/uL Final         Passed - HGB in normal range and within 360 days    Hemoglobin  Date Value Ref Range Status  04/18/2022 13.8 12.0 - 15.0 g/dL Final  11/91/4782 95.6 11.1 - 15.9 g/dL Final         Passed - HCT in normal range and within 360 days    HCT  Date Value Ref Range Status  04/18/2022 41.0 36.0 - 46.0 % Final   Hematocrit  Date Value Ref Range Status  11/01/2021 46.0 34.0 - 46.6 % Final         Passed - Cr  in normal range and within 360 days    Creatinine  Date Value Ref Range Status  09/16/2013 0.99 0.60 - 1.30 mg/dL Final   Creatinine, Ser  Date Value Ref Range Status  04/19/2022 0.88 0.44 - 1.00 mg/dL Final         Passed - eGFR is 30 or above and within 360 days    EGFR (African American)  Date Value Ref Range Status  09/16/2013 >60  Final   GFR calc Af Amer  Date Value Ref Range Status  09/15/2019 88 >59 mL/min/1.73 Final    Comment:    **[facility] currently reports eGFR in compliance with the current**   recommendations of the [employer]. [facility] will   update reporting as new guidelines are published from the NKF-ASN   Task force.    EGFR (Non-African Amer.)  Date Value Ref Range Status  09/16/2013 >60  Final    Comment:    eGFR values  <16mL/min/1.73 m2 may be an indication of chronic kidney disease (CKD). Calculated eGFR is useful in patients with stable renal function. The eGFR calculation will not be reliable in acutely ill patients when serum creatinine is changing rapidly. It is not useful in  patients on dialysis. The eGFR calculation may not be applicable to patients at the low and high extremes of body sizes, pregnant women, and vegetarians.    GFR, Estimated  Date Value Ref Range Status  04/19/2022 >60 >60 mL/min Final    Comment:    (NOTE) Calculated using the CKD-EPI Creatinine Equation (2021)    eGFR  Date Value Ref Range Status  11/01/2021 94 >59 mL/min/1.73 Final         Passed - Patient is not pregnant       traZODone (DESYREL) 100 MG tablet 30 tablet 0    Sig: Take 1 tablet (100 mg total) by mouth at bedtime.     Psychiatry: Antidepressants - Serotonin Modulator Failed - 12/07/2022 10:59 AM      Failed - Valid encounter within last 6 months    Recent Outpatient Visits           1 year ago Essential hypertension   [facility] Storm Frisk, MD               atorvastatin (LIPITOR) 40 MG tablet 30 tablet 0    Sig: Take 1 tablet (40 mg total) by mouth daily.     Cardiovascular:  Antilipid - Statins Failed - 12/07/2022 10:59 AM      Failed - Valid encounter within last 12 months    Recent Outpatient Visits           1 year ago Essential hypertension   [place] [facility] Storm Frisk, MD              Failed - Lipid Panel in normal range within the last 12 months    Cholesterol, Total  Date Value Ref Range Status  11/01/2021 172 100 - 199 mg/dL Final   Cholesterol  Date Value Ref Range Status  04/15/2022 154 0 - 200 mg/dL Final   LDL Chol Calc (NIH)  Date Value Ref Range Status  11/01/2021 113 (H) 0 - 99 mg/dL Final   LDL Cholesterol  Date Value Ref Range Status  04/15/2022 93 0 - 99 mg/dL  Final    Comment:           Total Cholesterol/HDL:CHD Risk  Coronary Heart Disease Risk Table                     Men   Women  1/2 Average Risk   3.4   3.3  Average Risk       5.0   4.4  2 X Average Risk   9.6   7.1  3 X Average Risk  23.4   11.0        Use the calculated Patient Ratio above and the CHD Risk Table to determine the patient's CHD Risk.        ATP III CLASSIFICATION (LDL):  <100     mg/dL   Optimal  161-096  mg/dL   Near or Above                    Optimal  130-159  mg/dL   Borderline  045-409  mg/dL   High  >811     mg/dL   Very High Performed at [facility], [address], [place], [place] [address]    HDL  Date Value Ref Range Status  04/15/2022 44 >40 mg/dL Final  29/56/2130 48 >86 mg/dL Final   Triglycerides  Date Value Ref Range Status  04/15/2022 86 <150 mg/dL Final         Passed - Patient is not pregnant       amLODipine (NORVASC) 10 MG tablet 30 tablet 0    Sig: Take 1 tablet (10 mg total) by mouth daily.     Cardiovascular: Calcium Channel Blockers 2 Failed - 12/07/2022 10:59 AM      Failed - Last BP in normal range    BP Readings from Last 1 Encounters:  05/15/22 (!) 125/100         Failed - Valid encounter within last 6 months    Recent Outpatient Visits           1 year ago Essential hypertension   [place] [facility] & [facility] Storm Frisk, MD              Passed - Last Heart Rate in normal range    Pulse Readings from Last 1 Encounters:  05/15/22 63          FLUoxetine (PROZAC) 40 MG capsule 30 capsule 0    Sig: Take 1 capsule (40 mg total) by mouth daily.     Psychiatry:  Antidepressants - SSRI Failed - 12/07/2022 10:59 AM      Failed - Valid encounter within last 6 months    Recent Outpatient Visits           1 year ago Essential hypertension   [place] [facility] Storm Frisk, MD               montelukast (SINGULAIR) 10 MG tablet 30 tablet  0    Sig: Take 1 tablet (10 mg total) by mouth at bedtime.     Pulmonology:  Leukotriene Inhibitors Failed - 12/07/2022 10:59 AM      Failed - Valid encounter within last 12 months    Recent Outpatient Visits           1 year ago Essential hypertension   [place] [facility] Storm Frisk, MD               pantoprazole (PROTONIX) 40 MG tablet 30 tablet 0    Sig: Take  1 tablet (40 mg total) by mouth daily. Take 30-60 minutes before the first meal of the day.     Gastroenterology: Proton Pump Inhibitors Failed - 12/07/2022 10:59 AM      Failed - Valid encounter within last 12 months    Recent Outpatient Visits           1 year ago Essential hypertension   [place] [facility] Storm Frisk, MD               valsartan-hydrochlorothiazide (DIOVAN-HCT) 320-25 MG tablet 30 tablet 0    Sig: Take 1 tablet by mouth daily.     Cardiovascular: ARB + Diuretic Combos Failed - 12/07/2022 10:59 AM      Failed - K in normal range and within 180 days    Potassium  Date Value Ref Range Status  04/19/2022 4.2 3.5 - 5.1 mmol/L Final  09/16/2013 3.5 3.5 - 5.1 mmol/L Final         Failed - Na in normal range and within 180 days    Sodium  Date Value Ref Range Status  04/19/2022 134 (L) 135 - 145 mmol/L Final  11/01/2021 141 134 - 144 mmol/L Final  09/16/2013 142 136 - 145 mmol/L Final         Failed - Cr in normal range and within 180 days    Creatinine  Date Value Ref Range Status  09/16/2013 0.99 0.60 - 1.30 mg/dL Final   Creatinine, Ser  Date Value Ref Range Status  04/19/2022 0.88 0.44 - 1.00 mg/dL Final         Failed - eGFR is 10 or above and within 180 days    EGFR (African American)  Date Value Ref Range Status  09/16/2013 >60  Final   GFR calc Af Amer  Date Value Ref Range Status  09/15/2019 88 >59 mL/min/1.73 Final    Comment:    **[facility] currently reports eGFR in compliance with the  current**   recommendations of the [employer]. [facility] will   update reporting as new guidelines are published from the NKF-ASN   Task force.    EGFR (Non-African Amer.)  Date Value Ref Range Status  09/16/2013 >60  Final    Comment:    eGFR values <72mL/min/1.73 m2 may be an indication of chronic kidney disease (CKD). Calculated eGFR is useful in patients with stable renal function. The eGFR calculation will not be reliable in acutely ill patients when serum creatinine is changing rapidly. It is not useful in  patients on dialysis. The eGFR calculation may not be applicable to patients at the low and high extremes of body sizes, pregnant women, and vegetarians.    GFR, Estimated  Date Value Ref Range Status  04/19/2022 >60 >60 mL/min Final    Comment:    (NOTE) Calculated using the CKD-EPI Creatinine Equation (2021)    eGFR  Date Value Ref Range Status  11/01/2021 94 >59 mL/min/1.73 Final         Failed - Last BP in normal range    BP Readings from Last 1 Encounters:  05/15/22 (!) 125/100         Failed - Valid encounter within last 6 months    Recent Outpatient Visits           1 year ago Essential hypertension   [place] [facility] & [facility] Storm Frisk, MD  Passed - Patient is not pregnant

## 2022-12-11 ENCOUNTER — Other Ambulatory Visit (HOSPITAL_COMMUNITY): Payer: Self-pay

## 2022-12-19 ENCOUNTER — Other Ambulatory Visit: Payer: Self-pay | Admitting: Critical Care Medicine

## 2022-12-19 DIAGNOSIS — R059 Cough, unspecified: Secondary | ICD-10-CM

## 2022-12-19 DIAGNOSIS — F419 Anxiety disorder, unspecified: Secondary | ICD-10-CM

## 2022-12-19 DIAGNOSIS — J449 Chronic obstructive pulmonary disease, unspecified: Secondary | ICD-10-CM

## 2022-12-19 DIAGNOSIS — R0602 Shortness of breath: Secondary | ICD-10-CM

## 2022-12-19 DIAGNOSIS — I10 Essential (primary) hypertension: Secondary | ICD-10-CM

## 2022-12-19 DIAGNOSIS — G4700 Insomnia, unspecified: Secondary | ICD-10-CM

## 2022-12-21 ENCOUNTER — Telehealth: Payer: Self-pay | Admitting: Critical Care Medicine

## 2022-12-21 ENCOUNTER — Telehealth: Payer: Self-pay | Admitting: Neurology

## 2022-12-21 ENCOUNTER — Other Ambulatory Visit: Payer: Self-pay | Admitting: Critical Care Medicine

## 2022-12-21 ENCOUNTER — Other Ambulatory Visit (HOSPITAL_COMMUNITY): Payer: Self-pay

## 2022-12-21 DIAGNOSIS — G4700 Insomnia, unspecified: Secondary | ICD-10-CM

## 2022-12-21 DIAGNOSIS — I10 Essential (primary) hypertension: Secondary | ICD-10-CM

## 2022-12-21 DIAGNOSIS — R059 Cough, unspecified: Secondary | ICD-10-CM

## 2022-12-21 DIAGNOSIS — J449 Chronic obstructive pulmonary disease, unspecified: Secondary | ICD-10-CM

## 2022-12-21 DIAGNOSIS — F419 Anxiety disorder, unspecified: Secondary | ICD-10-CM

## 2022-12-21 DIAGNOSIS — R0602 Shortness of breath: Secondary | ICD-10-CM

## 2022-12-21 NOTE — Telephone Encounter (Signed)
Pt has no showed on 06-05-22, 09-11-22 and 12-07-22.  Pt asking if Dr Pearlean Brownie will allow her to schedule again.  Pt was reminded of the No Show policy re: New Pt appointments, she has asked the request still be submitted.  Phone rep was advised by POD 3:she has no showed 3 times on the same year, she has been informed that we can not rescheduled her anymore. .  Message was relayed to pt.  This is FYI no call back requested

## 2022-12-21 NOTE — Telephone Encounter (Signed)
Spkoe with patient. Advised patient the she would need to be seen by a provider as she has not been seen since sept. 2023. Patient has agreed to go to [facility] on Tuesday 12/27/2022. Request that she keep appointment for 01/10/2023.

## 2022-12-21 NOTE — Telephone Encounter (Signed)
Requested medication (s) are due for refill today: yes  Requested medication (s) are on the active medication list: yes    Last refill: coreg  06/19/22  #180  0 refills   All other meds 11/03/22, 30 day supply  Future visit scheduled yes 01/10/23  Notes to clinic: Multiple no shows, please review if appropriate for refills. Thank you.  Requested Prescriptions  Pending Prescriptions Disp Refills   carvedilol (COREG) 12.5 MG tablet 180 tablet 0    Sig: Take 1 tablet (12.5 mg total) by mouth 2 (two) times daily with a meal.     Cardiovascular: Beta Blockers 3 Failed - 12/21/2022 10:47 AM      Failed - Last BP in normal range    BP Readings from Last 1 Encounters:  05/15/22 (!) 125/100         Failed - Valid encounter within last 6 months    Recent Outpatient Visits           1 year ago Essential hypertension   [facility] [facility] [place] [facility] Of [place]. [facility] Storm Frisk, MD       Future Appointments             In 2 weeks Sharon Seller, [place] [facility] [facility] [facility] - A Dept Of Eligha Bridegroom. [facility]            Passed - Cr in normal range and within 360 days    Creatinine  Date Value Ref Range Status  09/16/2013 0.99 0.60 - 1.30 mg/dL Final   Creatinine, Ser  Date Value Ref Range Status  04/19/2022 0.88 0.44 - 1.00 mg/dL Final         Passed - AST in normal range and within 360 days    AST  Date Value Ref Range Status  04/15/2022 20 15 - 41 U/L Final   SGOT(AST)  Date Value Ref Range Status  09/16/2013 21 15 - 37 Unit/L Final         Passed - ALT in normal range and within 360 days    ALT  Date Value Ref Range Status  04/15/2022 21 0 - 44 U/L Final   SGPT (ALT)  Date Value Ref Range Status  09/16/2013 19 U/L Final    Comment:    14-63 NOTE: New Reference Range 08/26/13          Passed - Last Heart Rate in normal range    Pulse Readings from Last 1 Encounters:  05/15/22 63          traZODone  (DESYREL) 100 MG tablet 30 tablet 0    Sig: Take 1 tablet (100 mg total) by mouth at bedtime.     Psychiatry: Antidepressants - Serotonin Modulator Failed - 12/21/2022 10:47 AM      Failed - Valid encounter within last 6 months    Recent Outpatient Visits           1 year ago Essential hypertension   [facility] [facility] [place] [facility] Of [place]. [facility] Storm Frisk, MD       Future Appointments             In 2 weeks Sharon Seller, [place] [facility] [place] [facility] - A Dept Of [place]igha Bridegroom. [facility]             atorvastatin (LIPITOR) 40 MG tablet 30 tablet 0  Sig: Take 1 tablet (40 mg total) by mouth daily.     Cardiovascular:  Antilipid - Statins Failed - 12/21/2022 10:47 AM      Failed - Valid encounter within last 12 months    Recent Outpatient Visits           1 year ago Essential hypertension   [place] [facility] [place] - [facility] Of [place]. [facility] Storm Frisk, MD       Future Appointments             In 2 weeks Sharon Seller, [place] [facility] [place] [facility] - [facility] Dept Of Eligha Bridegroom. [facility]            Failed - Lipid Panel in normal range within the last 12 months    Cholesterol, Total  Date Value Ref Range Status  11/01/2021 172 100 - 199 mg/dL Final   Cholesterol  Date Value Ref Range Status  04/15/2022 154 0 - 200 mg/dL Final   LDL Chol Calc (NIH)  Date Value Ref Range Status  11/01/2021 113 (H) 0 - 99 mg/dL Final   LDL Cholesterol  Date Value Ref Range Status  04/15/2022 93 0 - 99 mg/dL Final    Comment:           Total Cholesterol/HDL:CHD Risk Coronary Heart Disease Risk Table                     Men   Women  1/2 Average Risk   3.4   3.3  Average Risk       5.0   4.4  2 X Average Risk   9.6   7.1  3 X Average Risk  23.4   11.0        Use the calculated Patient Ratio above and the CHD Risk Table to determine the patient's CHD Risk.         ATP III CLASSIFICATION (LDL):  <100     mg/dL   Optimal  324-401  mg/dL   Near or Above                    Optimal  130-159  mg/dL   Borderline  027-253  mg/dL   High  >664     mg/dL   Very High Performed at [facility], [address] [place], [place] [address]    HDL  Date Value Ref Range Status  04/15/2022 44 >40 mg/dL Final  42/59/5638 48 >75 mg/dL Final   Triglycerides  Date Value Ref Range Status  04/15/2022 86 <150 mg/dL Final         Passed - Patient is not pregnant       amLODipine (NORVASC) 10 MG tablet 30 tablet 0    Sig: Take 1 tablet (10 mg total) by mouth daily.     Cardiovascular: Calcium Channel Blockers 2 Failed - 12/21/2022 10:47 AM      Failed - Last BP in normal range    BP Readings from Last 1 Encounters:  05/15/22 (!) 125/100         Failed - Valid encounter within last 6 months    Recent Outpatient Visits           1 year ago Essential hypertension   [facility] [facility] [place] [facility] Of [place]. [facility] Storm Frisk, MD  Future Appointments             In 2 weeks Sharon Seller, [place] [facility] [place] [facility] [place] - [facility] Dept Of [place]a Bridegroom. [facility]            Passed - Last Heart Rate in normal range    Pulse Readings from Last 1 Encounters:  05/15/22 63          FLUoxetine (PROZAC) 40 MG capsule 30 capsule 0    Sig: Take 1 capsule (40 mg total) by mouth daily.     Psychiatry:  Antidepressants - SSRI Failed - 12/21/2022 10:47 AM      Failed - Valid encounter within last 6 months    Recent Outpatient Visits           1 year ago Essential hypertension   [facility] [facility] [place] [facility] Of [place]. [facility] Storm Frisk, MD       Future Appointments             In 2 weeks Sharon Seller, [place] [facility] [place] [facility] - A Dept Of [place]igha Bridegroom. [facility]             montelukast (SINGULAIR) 10 MG tablet 30 tablet 0     Sig: Take 1 tablet (10 mg total) by mouth at bedtime.     Pulmonology:  Leukotriene Inhibitors Failed - 12/21/2022 10:47 AM      Failed - Valid encounter within last 12 months    Recent Outpatient Visits           1 year ago Essential hypertension   [place] [facility] [place] - [facility] Of [place]. [facility] Storm Frisk, MD       Future Appointments             In 2 weeks Sharon Seller, [place] [facility] [place] [facility] - A Dept Of Eligha Bridegroom. [facility]             pantoprazole (PROTONIX) 40 MG tablet 30 tablet 0    Sig: Take 1 tablet (40 mg total) by mouth daily. Take 30-60 minutes before the first meal of the day.     Gastroenterology: Proton Pump Inhibitors Failed - 12/21/2022 10:47 AM      Failed - Valid encounter within last 12 months    Recent Outpatient Visits           1 year ago Essential hypertension   [place] [facility] [employer] - A Dept Of . [facility] Storm Frisk, MD       Future Appointments             In 2 weeks Sharon Seller, [place] [facility] [place] [facility] - A Dept Of [place] Bridegroom. [facility]             valsartan-hydrochlorothiazide (DIOVAN-HCT) 320-25 MG tablet 30 tablet 0    Sig: Take 1 tablet by mouth daily.     Cardiovascular: ARB + Diuretic Combos Failed - 12/21/2022 10:47 AM      Failed - K in normal range and within 180 days    Potassium  Date Value Ref Range Status  04/19/2022 4.2 3.5 - 5.1 mmol/L Final  09/16/2013 3.5 3.5 - 5.1 mmol/L Final         Failed - Na in normal range and within  180 days    Sodium  Date Value Ref Range Status  04/19/2022 134 (L) 135 - 145 mmol/L Final  11/01/2021 141 134 - 144 mmol/L Final  09/16/2013 142 136 - 145 mmol/L Final         Failed - Cr in normal range and within 180 days    Creatinine  Date Value Ref Range Status  09/16/2013 0.99 0.60 - 1.30 mg/dL Final   Creatinine, Ser  Date Value Ref Range  Status  04/19/2022 0.88 0.44 - 1.00 mg/dL Final         Failed - eGFR is 10 or above and within 180 days    EGFR (African American)  Date Value Ref Range Status  09/16/2013 >60  Final   GFR calc Af Amer  Date Value Ref Range Status  09/15/2019 88 >59 mL/min/1.73 Final    Comment:    **[facility] currently reports eGFR in compliance with the current**   recommendations of the [employer]. [facility] will   update reporting as new guidelines are published from the NKF-ASN   Task force.    EGFR (Non-African Amer.)  Date Value Ref Range Status  09/16/2013 >60  Final    Comment:    eGFR values <58mL/min/1.73 m2 may be an indication of chronic kidney disease (CKD). Calculated eGFR is useful in patients with stable renal function. The eGFR calculation will not be reliable in acutely ill patients when serum creatinine is changing rapidly. It is not useful in  patients on dialysis. The eGFR calculation may not be applicable to patients at the low and high extremes of body sizes, pregnant women, and vegetarians.    GFR, Estimated  Date Value Ref Range Status  04/19/2022 >60 >60 mL/min Final    Comment:    (NOTE) Calculated using the CKD-EPI Creatinine Equation (2021)    eGFR  Date Value Ref Range Status  11/01/2021 94 >59 mL/min/1.73 Final         Failed - Last BP in normal range    BP Readings from Last 1 Encounters:  05/15/22 (!) 125/100         Failed - Valid encounter within last 6 months    Recent Outpatient Visits           1 year ago Essential hypertension   [place] [facility] [place] - [facility] Dept Of [place]. [facility] Storm Frisk, MD       Future Appointments             In 2 weeks Sharon Seller, [place] [facility] [place] [facility] Dept Of Eligha Bridegroom. [facility]            Passed - Patient is not pregnant

## 2022-12-21 NOTE — Telephone Encounter (Signed)
Pt is calling to speak to Erskine Squibb - reporting that she is in need of a neublizer advised that the pharmacy no longer carries it needs to go through medical supply company. Please advise Cb- [phone]

## 2022-12-21 NOTE — Telephone Encounter (Signed)
Noted  

## 2022-12-22 ENCOUNTER — Other Ambulatory Visit: Payer: Self-pay

## 2022-12-22 ENCOUNTER — Encounter: Payer: Self-pay | Admitting: Pharmacist

## 2022-12-22 ENCOUNTER — Telehealth: Payer: Medicaid Other | Admitting: Physician Assistant

## 2022-12-22 ENCOUNTER — Other Ambulatory Visit (HOSPITAL_COMMUNITY): Payer: Self-pay

## 2022-12-22 DIAGNOSIS — B9689 Other specified bacterial agents as the cause of diseases classified elsewhere: Secondary | ICD-10-CM

## 2022-12-22 DIAGNOSIS — J069 Acute upper respiratory infection, unspecified: Secondary | ICD-10-CM | POA: Diagnosis not present

## 2022-12-22 MED ORDER — BENZONATATE 100 MG PO CAPS
100.0000 mg | ORAL_CAPSULE | Freq: Three times a day (TID) | ORAL | 0 refills | Status: DC | PRN
  Filled 2022-12-22 (×5): qty 30, 5d supply, fill #0

## 2022-12-22 MED ORDER — DOXYCYCLINE HYCLATE 100 MG PO TABS
100.0000 mg | ORAL_TABLET | Freq: Two times a day (BID) | ORAL | 0 refills | Status: DC
  Filled 2022-12-22 (×5): qty 20, 10d supply, fill #0

## 2022-12-22 MED ORDER — PREDNISONE 10 MG (21) PO TBPK
ORAL_TABLET | ORAL | 0 refills | Status: AC
  Filled 2022-12-22 (×2): qty 21, 6d supply, fill #0
  Filled 2022-12-22: qty 21, fill #0
  Filled 2022-12-22 (×2): qty 21, 6d supply, fill #0

## 2022-12-22 MED ORDER — ALBUTEROL SULFATE HFA 108 (90 BASE) MCG/ACT IN AERS
1.0000 | INHALATION_SPRAY | Freq: Four times a day (QID) | RESPIRATORY_TRACT | 0 refills | Status: DC | PRN
  Filled 2022-12-22: qty 18, 30d supply, fill #0
  Filled 2022-12-22: qty 18, 25d supply, fill #0
  Filled 2022-12-22: qty 18, fill #0
  Filled 2022-12-22 (×2): qty 18, 30d supply, fill #0

## 2022-12-22 NOTE — Progress Notes (Signed)
E-Visit for Cough   We are sorry that you are not feeling well.  Here is how we plan to help!  Based on your presentation I believe you most likely have A cough due to bacteria.  When patients have a fever and a productive cough with a change in color or increased sputum production, we are concerned about bacterial bronchitis.  If left untreated it can progress to pneumonia.  If your symptoms do not improve with your treatment plan it is important that you contact your provider.   I have prescribed Doxycycline 100 mg twice a day for 7 days     In addition you may use A non-prescription cough medication called Mucinex DM: take 2 tablets every 12 hours. and A prescription cough medication called Tessalon Perles 100mg . You may take 1-2 capsules every 8 hours as needed for your cough.  Prednisone 10 mg daily for 6 days (see taper instructions below)  Directions for 6 day taper: Day 1: 2 tablets before breakfast, 1 after both lunch & dinner and 2 at bedtime Day 2: 1 tab before breakfast, 1 after both lunch & dinner and 2 at bedtime Day 3: 1 tab at each meal & 1 at bedtime Day 4: 1 tab at breakfast, 1 at lunch, 1 at bedtime Day 5: 1 tab at breakfast & 1 tab at bedtime Day 6: 1 tab at breakfast  Albuterol inhaler Use 1-2 puffs every 4-6 hours as needed for shortness of breath and wheezing  From your responses in the eVisit questionnaire you describe inflammation in the upper respiratory tract which is causing a significant cough.  This is commonly called Bronchitis and has four common causes:   Allergies Viral Infections Acid Reflux Bacterial Infection Allergies, viruses and acid reflux are treated by controlling symptoms or eliminating the cause. An example might be a cough caused by taking certain blood pressure medications. You stop the cough by changing the medication. Another example might be a cough caused by acid reflux. Controlling the reflux helps control the cough.  USE OF  BRONCHODILATOR ("RESCUE") INHALERS: There is a risk from using your bronchodilator too frequently.  The risk is that over-reliance on a medication which only relaxes the muscles surrounding the breathing tubes can reduce the effectiveness of medications prescribed to reduce swelling and congestion of the tubes themselves.  Although you feel brief relief from the bronchodilator inhaler, your asthma may actually be worsening with the tubes becoming more swollen and filled with mucus.  This can delay other crucial treatments, such as oral steroid medications. If you need to use a bronchodilator inhaler daily, several times per day, you should discuss this with your provider.  There are probably better treatments that could be used to keep your asthma under control.     HOME CARE Only take medications as instructed by your medical team. Complete the entire course of an antibiotic. Drink plenty of fluids and get plenty of rest. Avoid close contacts especially the very young and the elderly Cover your mouth if you cough or cough into your sleeve. Always remember to wash your hands A steam or ultrasonic humidifier can help congestion.   GET HELP RIGHT AWAY IF: You develop worsening fever. You become short of breath You cough up blood. Your symptoms persist after you have completed your treatment plan MAKE SURE YOU  Understand these instructions. Will watch your condition. Will get help right away if you are not doing well or get worse.    Thank  you for choosing an e-visit.  Your e-visit answers were reviewed by a board certified advanced clinical practitioner to complete your personal care plan. Depending upon the condition, your plan could have included both over the counter or prescription medications.  Please review your pharmacy choice. Make sure the pharmacy is open so you can pick up prescription now. If there is a problem, you may contact your provider through [employer] and have  the prescription routed to another pharmacy.  Your safety is important to [place]. If you have drug allergies check your prescription carefully.   For the next 24 hours you can use MyChart to ask questions about today's visit, request a non-urgent call back, or ask for a work or school excuse. You will get an email in the next two days asking about your experience. I hope that your e-visit has been valuable and will speed your recovery.   I have spent 5 minutes in review of e-visit questionnaire, review and updating patient chart, medical decision making and response to patient.   Margaretann Loveless, PA-C

## 2022-12-25 ENCOUNTER — Encounter: Payer: Self-pay | Admitting: Neurology

## 2022-12-27 ENCOUNTER — Other Ambulatory Visit (HOSPITAL_COMMUNITY): Payer: Self-pay

## 2023-01-10 ENCOUNTER — Telehealth (INDEPENDENT_AMBULATORY_CARE_PROVIDER_SITE_OTHER): Payer: Self-pay | Admitting: Primary Care

## 2023-01-10 ENCOUNTER — Ambulatory Visit: Payer: Medicaid Other | Admitting: Physician Assistant

## 2023-01-10 NOTE — Telephone Encounter (Signed)
 Called pt to confirm apt. Pt will be present.

## 2023-01-11 ENCOUNTER — Encounter (INDEPENDENT_AMBULATORY_CARE_PROVIDER_SITE_OTHER): Payer: Self-pay | Admitting: Primary Care

## 2023-01-11 ENCOUNTER — Other Ambulatory Visit (HOSPITAL_COMMUNITY): Payer: Self-pay

## 2023-01-11 ENCOUNTER — Ambulatory Visit (INDEPENDENT_AMBULATORY_CARE_PROVIDER_SITE_OTHER): Payer: Medicaid Other | Admitting: Primary Care

## 2023-01-11 VITALS — BP 190/110 | HR 88 | Resp 16 | Ht 64.0 in | Wt 211.2 lb

## 2023-01-11 DIAGNOSIS — Z1211 Encounter for screening for malignant neoplasm of colon: Secondary | ICD-10-CM

## 2023-01-11 DIAGNOSIS — E669 Obesity, unspecified: Secondary | ICD-10-CM

## 2023-01-11 DIAGNOSIS — R0602 Shortness of breath: Secondary | ICD-10-CM

## 2023-01-11 DIAGNOSIS — F331 Major depressive disorder, recurrent, moderate: Secondary | ICD-10-CM

## 2023-01-11 DIAGNOSIS — G4700 Insomnia, unspecified: Secondary | ICD-10-CM | POA: Diagnosis not present

## 2023-01-11 DIAGNOSIS — J449 Chronic obstructive pulmonary disease, unspecified: Secondary | ICD-10-CM

## 2023-01-11 DIAGNOSIS — R059 Cough, unspecified: Secondary | ICD-10-CM | POA: Diagnosis not present

## 2023-01-11 DIAGNOSIS — J069 Acute upper respiratory infection, unspecified: Secondary | ICD-10-CM

## 2023-01-11 DIAGNOSIS — I10 Essential (primary) hypertension: Secondary | ICD-10-CM

## 2023-01-11 DIAGNOSIS — B9689 Other specified bacterial agents as the cause of diseases classified elsewhere: Secondary | ICD-10-CM | POA: Diagnosis not present

## 2023-01-11 DIAGNOSIS — F419 Anxiety disorder, unspecified: Secondary | ICD-10-CM

## 2023-01-11 DIAGNOSIS — Z23 Encounter for immunization: Secondary | ICD-10-CM | POA: Diagnosis not present

## 2023-01-11 DIAGNOSIS — E782 Mixed hyperlipidemia: Secondary | ICD-10-CM

## 2023-01-11 DIAGNOSIS — Z1231 Encounter for screening mammogram for malignant neoplasm of breast: Secondary | ICD-10-CM

## 2023-01-11 NOTE — Patient Instructions (Signed)

## 2023-01-12 ENCOUNTER — Telehealth (INDEPENDENT_AMBULATORY_CARE_PROVIDER_SITE_OTHER): Payer: Self-pay | Admitting: Primary Care

## 2023-01-12 ENCOUNTER — Other Ambulatory Visit: Payer: Self-pay

## 2023-01-12 ENCOUNTER — Telehealth: Payer: Self-pay | Admitting: Primary Care

## 2023-01-12 ENCOUNTER — Encounter (INDEPENDENT_AMBULATORY_CARE_PROVIDER_SITE_OTHER): Payer: Self-pay | Admitting: Primary Care

## 2023-01-12 ENCOUNTER — Other Ambulatory Visit (HOSPITAL_COMMUNITY): Payer: Self-pay

## 2023-01-12 MED ORDER — ATORVASTATIN CALCIUM 40 MG PO TABS
40.0000 mg | ORAL_TABLET | Freq: Every day | ORAL | 1 refills | Status: DC
  Filled 2023-01-12: qty 90, 90d supply, fill #0
  Filled 2023-04-12: qty 90, 90d supply, fill #1

## 2023-01-12 MED ORDER — CARVEDILOL 12.5 MG PO TABS
12.5000 mg | ORAL_TABLET | Freq: Two times a day (BID) | ORAL | 1 refills | Status: DC
  Filled 2023-01-12: qty 180, 90d supply, fill #0
  Filled 2023-04-12: qty 180, 90d supply, fill #1

## 2023-01-12 MED ORDER — VALSARTAN-HYDROCHLOROTHIAZIDE 320-25 MG PO TABS
1.0000 | ORAL_TABLET | Freq: Every day | ORAL | 1 refills | Status: DC
  Filled 2023-01-12: qty 90, 90d supply, fill #0
  Filled 2023-04-12: qty 90, 90d supply, fill #1

## 2023-01-12 MED ORDER — DICLOFENAC SODIUM 1 % EX GEL
2.0000 g | Freq: Four times a day (QID) | CUTANEOUS | 1 refills | Status: DC
  Filled 2023-01-12: qty 100, 13d supply, fill #0
  Filled 2023-02-09: qty 100, 13d supply, fill #1

## 2023-01-12 MED ORDER — CARIPRAZINE HCL 1.5 MG PO CAPS
1.5000 mg | ORAL_CAPSULE | Freq: Every day | ORAL | 1 refills | Status: DC
  Filled 2023-01-12: qty 30, 30d supply, fill #0
  Filled 2023-02-09: qty 30, 30d supply, fill #1
  Filled 2023-03-11: qty 30, 30d supply, fill #2
  Filled 2023-04-01 – 2023-04-04 (×3): qty 30, 30d supply, fill #3
  Filled 2023-05-07 (×2): qty 30, 30d supply, fill #4
  Filled 2023-06-03: qty 30, 30d supply, fill #5

## 2023-01-12 MED ORDER — ALBUTEROL SULFATE (2.5 MG/3ML) 0.083% IN NEBU
2.5000 mg | INHALATION_SOLUTION | Freq: Four times a day (QID) | RESPIRATORY_TRACT | 1 refills | Status: DC | PRN
  Filled 2023-01-12: qty 180, 15d supply, fill #0
  Filled 2023-04-21: qty 180, 15d supply, fill #1

## 2023-01-12 MED ORDER — APIXABAN 5 MG PO TABS
5.0000 mg | ORAL_TABLET | Freq: Two times a day (BID) | ORAL | 1 refills | Status: DC
  Filled 2023-01-12 – 2023-01-15 (×2): qty 180, 90d supply, fill #0
  Filled 2023-04-12 – 2023-04-16 (×2): qty 180, 90d supply, fill #1

## 2023-01-12 MED ORDER — PANTOPRAZOLE SODIUM 40 MG PO TBEC
40.0000 mg | DELAYED_RELEASE_TABLET | Freq: Every day | ORAL | 1 refills | Status: DC
  Filled 2023-01-12: qty 90, 90d supply, fill #0
  Filled 2023-04-12: qty 90, 90d supply, fill #1

## 2023-01-12 MED ORDER — AMLODIPINE BESYLATE 10 MG PO TABS
10.0000 mg | ORAL_TABLET | Freq: Every day | ORAL | 1 refills | Status: DC
  Filled 2023-01-12: qty 90, 90d supply, fill #0
  Filled 2023-04-12: qty 90, 90d supply, fill #1

## 2023-01-12 MED ORDER — MONTELUKAST SODIUM 10 MG PO TABS
10.0000 mg | ORAL_TABLET | Freq: Every day | ORAL | 1 refills | Status: DC
  Filled 2023-01-12: qty 90, 90d supply, fill #0
  Filled 2023-04-12: qty 90, 90d supply, fill #1

## 2023-01-12 MED ORDER — CLONIDINE HCL 0.1 MG PO TABS
0.1000 mg | ORAL_TABLET | Freq: Once | ORAL | Status: DC
Start: 2023-01-12 — End: 2023-06-13

## 2023-01-12 MED ORDER — ALBUTEROL SULFATE HFA 108 (90 BASE) MCG/ACT IN AERS
1.0000 | INHALATION_SPRAY | Freq: Four times a day (QID) | RESPIRATORY_TRACT | 2 refills | Status: DC | PRN
  Filled 2023-01-12: qty 18, 25d supply, fill #0
  Filled 2023-04-21: qty 18, 30d supply, fill #0
  Filled 2023-06-11: qty 18, 30d supply, fill #1

## 2023-01-12 MED ORDER — FLUOXETINE HCL 40 MG PO CAPS
40.0000 mg | ORAL_CAPSULE | Freq: Every day | ORAL | 1 refills | Status: DC
  Filled 2023-01-12: qty 90, 90d supply, fill #0

## 2023-01-12 NOTE — Telephone Encounter (Signed)
Copied from CRM [id number]. Topic: General - Other >> Jan 12, 2023  8:45 AM Haroldine Laws wrote: Reason for CRM: pt called saying she did not get her medication sent to the pharmacy yesterday after her visit .  She needs the BP medication especially.  [facility]  [phone]

## 2023-01-12 NOTE — Telephone Encounter (Signed)
Pt states the FLUoxetine (PROZAC) 40 MG capsule was causing mood swings.  She discussed going on another medication, and said Marcelino Duster mentioned changing the pt to buspar.  Also, pt wants to know if Marcelino Duster is going to change one of her bp meds to Clonidine. She states Marcelino Duster said something about switching her to this. Pt wants to know if Marcelino Duster is going to do either of these things

## 2023-01-12 NOTE — Telephone Encounter (Signed)
Will forward to provider  

## 2023-01-12 NOTE — Telephone Encounter (Signed)
Referral Request - Did the patient discuss referral with their provider in the last year? Yes (If No - schedule appointment) (If Yes - send message)  Appointment offered? No  Type of order/referral and detailed reason for visit: physical therapy (pt still has balance issues, and weak on the right side.)  pt also having memory issues, and a hard time concentrating. Preference of office, provider, location: [facility] [address]  If referral order, have you been seen by this specialty before? no (If Yes, this issue or another issue? When? Where?  Can we respond through MyChart? Yes   2.  Referral Request - Did the patient discuss referral with their provider in the last year? Yes  Appointment offered? no  (referral not done by Marcelino Duster at visit 01/11/23)  Type of order/referral and detailed reason for visit: Anxiety issues/dealing w/ all her health issues  Preference of office, provider, location: [facility]orial health  If referral order, have you been seen by this specialty before? No (If Yes, this issue or another issue? When? Where?  Can we respond through MyChart? Yes  3.Referral Request - Did the patient discuss referral with their provider in the last year? Yes  Appointment offered? Yes  (but referral not done by Marcelino Duster at visit )  Type of order/referral and detailed reason for visit: neurologist, pt had a brain bleed, stroke. Pt was dismissed by [facility] neuro.  Preference of office, provider, location: any.  Pt thinks [facility] may have dismissed her for missing too many appts  If referral order, have you been seen by this specialty before? Yes (If Yes, this issue or another issue? When? Where? Not sure  Can we respond through MyChart? Yes

## 2023-01-12 NOTE — Progress Notes (Signed)
New Patient Office Visit  Subjective    Patient ID: Catherine Kane female  DOB: 1971-01-12  Age: 52 y.o. MRN: [id number]   CC:   Hot Flashes   HPI  Catherine Kane 52 year old obese female presents to establish care and recently seen via E- Visit for cough. Tx with Mucinex DM: take 2 tablets every 12 hours. and A prescription cough medication called Tessalon Perles 100mg . You may take 1-2 capsules every 8 hours as needed for your cough. Prednisone 10 mg daily for 6 days. Current Outpatient Medications on File Prior to Visit  Medication Sig Dispense Refill   butalbital-acetaminophen-caffeine (FIORICET) 50-325-40 MG tablet Take 1 tablet by mouth every 4 (four) hours as needed for headache or migraine. 14 tablet 0   docusate sodium (COLACE) 100 MG capsule Take 1 capsule (100 mg total) by mouth daily. 10 capsule 0   ergocalciferol (VITAMIN D2) 1.25 MG (50000 UT) capsule Take 1 capsule (50,000 Units total) by mouth every 7 (seven) days. 4 capsule 0   levETIRAcetam (KEPPRA) 500 MG tablet Take 1 tablet (500 mg total) by mouth 2 (two) times daily for 2 days. 4 tablet 0   ondansetron (ZOFRAN) 4 MG tablet Take 1 tablet (4 mg total) by mouth every 8 (eight) hours as needed for nausea or vomiting. 10 tablet 0   No current facility-administered medications on file prior to visit.     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   Vitamin B12 deficiency 09/2019   Vitamin D deficiency 10/2018   Well woman exam with routine gynecological exam 11/26/2018     Past Surgical History:  Procedure Laterality Date   TONSILLECTOMY     TUBAL LIGATION       Family History  Problem Relation Age of Onset   Heart disease Mother    Heart disease Sister    Breast cancer  Maternal Aunt    Breast cancer Maternal Grandmother     Social History   Socioeconomic History   Marital status: Single    Spouse name: Not on file   Number of children: Not on file   Years of education: Not on file   Highest education level: 12th grade  Occupational History   Not on file  Tobacco Use   Smoking status: Never   Smokeless tobacco: Never  Vaping Use   Vaping status: Never Used  Substance and Sexual Activity   Alcohol use: Yes    Alcohol/week: 1.0 standard drink of alcohol    Types: 1 Glasses of wine per week    Comment: one glass once a month   Drug use: No   Sexual activity: Yes    Birth control/protection: Surgical  Other Topics Concern   Not on file  Social History Narrative   Not on file   Social Determinants of Health   Financial Resource Strain: Medium Risk (05/22/2022)   Overall Financial Resource Strain (CARDIA)    Difficulty of Paying Living Expenses: Somewhat hard  Food Insecurity: Food Insecurity Present (05/22/2022)   Hunger Vital Sign    Worried About Running Out of Food in the Last Year: Sometimes true    Ran Out of Food in the Last Year: Sometimes true  Transportation Needs:  No Transportation Needs (05/22/2022)   PRAPARE - Administrator, Civil Service (Medical): No    Lack of Transportation (Non-Medical): No  Physical Activity: Unknown (05/22/2022)   Exercise Vital Sign    Days of Exercise per Week: 0 days    Minutes of Exercise per Session: Not on file  Stress: Stress Concern Present (05/22/2022)   [employer] of Occupational Health - Occupational Stress Questionnaire    Feeling of Stress : Very much  Social Connections: Moderately Integrated (05/22/2022)   Social Connection and Isolation Panel [NHANES]    Frequency of Communication with Friends and Family: More than three times a week    Frequency of Social Gatherings with Friends and Family: More than three times a week    Attends Religious Services: More than 4 times  per year    Active Member of [employer] or Organizations: No    Attends Banker Meetings: Not on file    Marital Status: Living with partner  Intimate Partner Violence: Not At Risk (04/17/2022)   Humiliation, Afraid, Rape, and Kick questionnaire    Fear of Current or Ex-Partner: No    Emotionally Abused: No    Physically Abused: No    Sexually Abused: No       Health Maintenance  Topic Date Due   Colon Cancer Screening  Never done   COVID-19 Vaccine (1 - 2023-24 season) Never done   Mammogram  12/12/2022   Zoster (Shingles) Vaccine (1 of 2) Never done   Pap with HPV screening  11/26/2023   DTaP/Tdap/Td vaccine (2 - Td or Tdap) 07/06/2024   Flu Shot  Completed   Hepatitis C Screening  Completed   HIV Screening  Completed   HPV Vaccine  Aged Out    Objective    BP (!) 190/110 (BP Location: Left Arm, Patient Position: Sitting, Cuff Size: Large) Comment: manual repeat  Pulse 88   Resp 16   Ht 5\' 4"  (1.626 m)   Wt 211 lb 3.2 oz (95.8 kg)   LMP 12/28/2022   SpO2 96%   BMI 36.25 kg/m    Physical Exam Vitals reviewed.  Constitutional:      Appearance: She is obese.  HENT:     Head: Normocephalic.     Right Ear: Tympanic membrane, ear canal and external ear normal.     Left Ear: Tympanic membrane, ear canal and external ear normal.     Nose: Nose normal.     Mouth/Throat:     Mouth: Mucous membranes are moist.  Eyes:     Extraocular Movements: Extraocular movements intact.     Pupils: Pupils are equal, round, and reactive to light.  Cardiovascular:     Rate and Rhythm: Normal rate.  Pulmonary:     Effort: Pulmonary effort is normal.     Breath sounds: Normal breath sounds.  Abdominal:     General: Bowel sounds are normal. There is distension.     Palpations: Abdomen is soft.  Musculoskeletal:        General: Normal range of motion.     Cervical back: Normal range of motion.  Skin:    General: Skin is warm and dry.  Neurological:     Mental Status:  She is alert and oriented to person, place, and time.  Psychiatric:        Mood and Affect: Mood normal.     Assessment & Plan:  Libni was seen today for new patient (initial visit) and  hot flashes.  Diagnoses and all orders for this visit:  Encounter for immunization -     Flu vaccine trivalent PF, 6mos and older(Flulaval,Afluria,Fluarix,Fluzone)  Bacterial upper respiratory infection -     albuterol (VENTOLIN HFA) 108 (90 Base) MCG/ACT inhaler; Inhale 1-2 puffs into the lungs every 6 (six) hours as needed.  Essential hypertension -     cloNIDine (CATAPRES) tablet 0.1 mg -     amLODipine (NORVASC) 10 MG tablet; Take 1 tablet (10 mg total) by mouth daily. -     carvedilol (COREG) 12.5 MG tablet; Take 1 tablet (12.5 mg total) by mouth 2 (two) times daily with a meal. -     valsartan-hydrochlorothiazide (DIOVAN-HCT) 320-25 MG tablet; Take 1 tablet by mouth daily.  Anxiety -     Ambulatory referral to Psychiatry -     FLUoxetine (PROZAC) 40 MG capsule; Take 1 capsule (40 mg total) by mouth daily.  Shortness of breath -     montelukast (SINGULAIR) 10 MG tablet; Take 1 tablet (10 mg total) by mouth at bedtime.  Cough -     montelukast (SINGULAIR) 10 MG tablet; Take 1 tablet (10 mg total) by mouth at bedtime. -     pantoprazole (PROTONIX) 40 MG tablet; Take 1 tablet (40 mg total) by mouth daily. Take 30-60 minutes before the first meal of the day.  Chronic obstructive pulmonary disease, unspecified COPD type (HCC) -     albuterol (PROVENTIL) (2.5 MG/3ML) 0.083% nebulizer solution; Take 3 mLs (2.5 mg total) by nebulization every 6 (six) hours as needed for wheezing or shortness of breath. -     albuterol (VENTOLIN HFA) 108 (90 Base) MCG/ACT inhaler; Inhale 1-2 puffs into the lungs every 6 (six) hours as needed. -     montelukast (SINGULAIR) 10 MG tablet; Take 1 tablet (10 mg total) by mouth at bedtime.  Insomnia, unspecified type -     Ambulatory referral to Psychiatry  Major  depressive disorder, recurrent episode, moderate (HCC) -     Ambulatory referral to Psychiatry -     cariprazine (VRAYLAR) 1.5 MG capsule; Take 1 capsule (1.5 mg total) by mouth daily.  Encounter for screening mammogram for malignant neoplasm of breast -     MM DIGITAL SCREENING BILATERAL; Future  Screening for colon cancer  Mixed hyperlipidemia -     atorvastatin (LIPITOR) 40 MG tablet; Take 1 tablet (40 mg total) by mouth daily.  Other orders -     apixaban (ELIQUIS) 5 MG TABS tablet; Take 1 tablet (5 mg total) by mouth 2 (two) times daily. -     diclofenac Sodium (VOLTAREN) 1 % GEL; Apply 2 grams topically 4 (four) times daily to painful joints     Follow-up:    The above assessment and management plan was discussed with the patient. The patient verbalized understanding of and has agreed to the management plan. Patient is aware to call the clinic if symptoms fail to improve or worsen. Patient is aware when to return to the clinic for a follow-up visit. Patient educated on when it is appropriate to go to the emergency department.   Gwinda Passe, NP-C

## 2023-01-12 NOTE — Telephone Encounter (Signed)
Pt states she walks w/ a walker.  However, pt would like to be switched to a cane. Pt would like an order sent to wherever you can get her a cane.

## 2023-01-12 NOTE — Telephone Encounter (Signed)
Medication Refill -  Most Recent Primary Care Visit:  Provider: Grayce Sessions  Department: [facility]  Visit Type: OFFICE VISIT  Date: 01/11/2023  Medication:  albuterol (PROVENTIL) (2.5 MG/3ML) 0.083% nebulizer solution  amLODipine (NORVASC) 10 MG tablet apixaban (ELIQUIS) 5 MG TABS tablet atorvastatin (LIPITOR) 40 MG tablet butalbital-acetaminophen-caffeine (FIORICET) 50-325-40 MG table carvedilol (COREG) 12.5 MG tablet ergocalciferol (VITAMIN D2) 1.25 MG (50000 UT) capsule  levETIRAcetam (KEPPRA) 500 MG tablet  montelukast (SINGULAIR) 10 MG tablet  traZODone (DESYREL) 100 MG tablet valsartan-hydrochlorothiazide (DIOVAN-HCT) 320-25 MG table Has the patient contacted their pharmacy? No Pt was in the hospital and has been out of her meds for over 2 weeks.  Is this the correct pharmacy for this prescription? Yes If no, delete pharmacy and type the correct one.  This is the patient's preferred pharmacy:    Has the prescription been filled recently? No  Is the patient out of the medication? Yes  Has the patient been seen for an appointment in the last year OR does the patient have an upcoming appointment? Yes  Can we respond through MyChart? Yes  Agent: Please be advised that Rx refills may take up to 3 business days. We ask that you follow-up with your pharmacy.

## 2023-01-15 ENCOUNTER — Other Ambulatory Visit (HOSPITAL_COMMUNITY): Payer: Self-pay

## 2023-01-15 ENCOUNTER — Other Ambulatory Visit: Payer: Self-pay

## 2023-01-15 ENCOUNTER — Encounter (INDEPENDENT_AMBULATORY_CARE_PROVIDER_SITE_OTHER): Payer: Self-pay | Admitting: Primary Care

## 2023-01-17 ENCOUNTER — Telehealth: Payer: Self-pay | Admitting: Primary Care

## 2023-01-17 NOTE — Telephone Encounter (Signed)
Copied from CRM [id number]. Topic: General - Other >> Jan 17, 2023  1:10 PM Everette C wrote: Reason for CRM: The patient would like to speak with a member of clinical staff about their recent prescription of Rx #: [id number]  cariprazine (VRAYLAR) 1.5 MG capsule [[id number]]   The patient has a history of high blood pressure concerns and has previously had a stroke and is concerned about potential side effects  Please contact further when available

## 2023-01-19 ENCOUNTER — Ambulatory Visit: Payer: Medicaid Other

## 2023-01-19 NOTE — Telephone Encounter (Signed)
Spoke with Marcelino Duster and per provider she will find an alternative to replace with

## 2023-01-22 ENCOUNTER — Other Ambulatory Visit: Payer: Self-pay

## 2023-01-22 ENCOUNTER — Other Ambulatory Visit (INDEPENDENT_AMBULATORY_CARE_PROVIDER_SITE_OTHER): Payer: Self-pay | Admitting: Primary Care

## 2023-01-22 ENCOUNTER — Other Ambulatory Visit (HOSPITAL_COMMUNITY): Payer: Self-pay

## 2023-01-22 ENCOUNTER — Encounter (INDEPENDENT_AMBULATORY_CARE_PROVIDER_SITE_OTHER): Payer: Self-pay | Admitting: Primary Care

## 2023-01-22 MED ORDER — BUPROPION HCL ER (SR) 100 MG PO TB12
100.0000 mg | ORAL_TABLET | Freq: Two times a day (BID) | ORAL | 1 refills | Status: DC
Start: 2023-01-22 — End: 2023-04-12
  Filled 2023-01-22 (×2): qty 180, 90d supply, fill #0

## 2023-01-22 MED ORDER — CLONIDINE HCL 0.1 MG PO TABS
0.1000 mg | ORAL_TABLET | Freq: Every day | ORAL | 1 refills | Status: DC
  Filled 2023-01-22 (×2): qty 90, 90d supply, fill #0
  Filled 2023-04-12: qty 90, 90d supply, fill #1

## 2023-01-25 ENCOUNTER — Ambulatory Visit (INDEPENDENT_AMBULATORY_CARE_PROVIDER_SITE_OTHER): Payer: Medicaid Other

## 2023-02-09 ENCOUNTER — Other Ambulatory Visit: Payer: Self-pay

## 2023-02-13 ENCOUNTER — Ambulatory Visit: Payer: Medicaid Other

## 2023-02-20 ENCOUNTER — Ambulatory Visit: Payer: Medicaid Other

## 2023-03-12 ENCOUNTER — Other Ambulatory Visit: Payer: Self-pay

## 2023-04-01 ENCOUNTER — Other Ambulatory Visit: Payer: Self-pay | Admitting: Critical Care Medicine

## 2023-04-01 DIAGNOSIS — E559 Vitamin D deficiency, unspecified: Secondary | ICD-10-CM

## 2023-04-02 ENCOUNTER — Other Ambulatory Visit (HOSPITAL_COMMUNITY): Payer: Self-pay

## 2023-04-02 ENCOUNTER — Other Ambulatory Visit: Payer: Self-pay | Admitting: Critical Care Medicine

## 2023-04-02 ENCOUNTER — Other Ambulatory Visit: Payer: Self-pay

## 2023-04-02 DIAGNOSIS — E559 Vitamin D deficiency, unspecified: Secondary | ICD-10-CM

## 2023-04-03 ENCOUNTER — Other Ambulatory Visit: Payer: Self-pay

## 2023-04-05 ENCOUNTER — Telehealth (INDEPENDENT_AMBULATORY_CARE_PROVIDER_SITE_OTHER): Payer: Self-pay | Admitting: Primary Care

## 2023-04-05 ENCOUNTER — Other Ambulatory Visit: Payer: Self-pay

## 2023-04-05 ENCOUNTER — Other Ambulatory Visit (HOSPITAL_COMMUNITY): Payer: Self-pay

## 2023-04-05 NOTE — Telephone Encounter (Signed)
 Called pt to confirm atp. Pt will be present

## 2023-04-11 ENCOUNTER — Telehealth (INDEPENDENT_AMBULATORY_CARE_PROVIDER_SITE_OTHER): Payer: Self-pay

## 2023-04-11 NOTE — Telephone Encounter (Signed)
 Called pt to remind them about appt. Pt will be present.

## 2023-04-12 ENCOUNTER — Other Ambulatory Visit: Payer: Self-pay

## 2023-04-12 ENCOUNTER — Other Ambulatory Visit (INDEPENDENT_AMBULATORY_CARE_PROVIDER_SITE_OTHER): Payer: Self-pay | Admitting: Primary Care

## 2023-04-12 ENCOUNTER — Ambulatory Visit (INDEPENDENT_AMBULATORY_CARE_PROVIDER_SITE_OTHER): Payer: Self-pay | Admitting: Primary Care

## 2023-04-12 ENCOUNTER — Encounter (INDEPENDENT_AMBULATORY_CARE_PROVIDER_SITE_OTHER): Payer: Self-pay

## 2023-04-12 ENCOUNTER — Encounter (INDEPENDENT_AMBULATORY_CARE_PROVIDER_SITE_OTHER): Payer: Self-pay | Admitting: Primary Care

## 2023-04-12 ENCOUNTER — Other Ambulatory Visit (HOSPITAL_COMMUNITY): Payer: Self-pay

## 2023-04-12 VITALS — BP 160/92 | HR 84 | Temp 97.7°F | Resp 16 | Ht 63.0 in | Wt 207.0 lb

## 2023-04-12 DIAGNOSIS — I10 Essential (primary) hypertension: Secondary | ICD-10-CM | POA: Diagnosis not present

## 2023-04-12 DIAGNOSIS — I639 Cerebral infarction, unspecified: Secondary | ICD-10-CM

## 2023-04-12 DIAGNOSIS — Z139 Encounter for screening, unspecified: Secondary | ICD-10-CM | POA: Diagnosis not present

## 2023-04-12 DIAGNOSIS — Z6836 Body mass index (BMI) 36.0-36.9, adult: Secondary | ICD-10-CM

## 2023-04-12 DIAGNOSIS — E66812 Obesity, class 2: Secondary | ICD-10-CM

## 2023-04-12 DIAGNOSIS — M5431 Sciatica, right side: Secondary | ICD-10-CM | POA: Diagnosis not present

## 2023-04-12 DIAGNOSIS — J301 Allergic rhinitis due to pollen: Secondary | ICD-10-CM

## 2023-04-12 MED ORDER — LORATADINE 10 MG PO TABS
10.0000 mg | ORAL_TABLET | Freq: Every day | ORAL | 1 refills | Status: DC
Start: 2023-04-12 — End: 2023-10-03
  Filled 2023-04-12: qty 90, 90d supply, fill #0
  Filled 2023-04-12: qty 30, 30d supply, fill #0
  Filled 2023-05-08: qty 30, 30d supply, fill #1
  Filled 2023-06-03: qty 30, 30d supply, fill #2
  Filled 2023-07-08: qty 30, 30d supply, fill #3
  Filled 2023-08-03: qty 30, 30d supply, fill #4
  Filled 2023-09-04: qty 30, 30d supply, fill #5

## 2023-04-12 NOTE — Progress Notes (Signed)
 Concerns with weight gain Would like medication to help with allergies. Takes singulair HS.  Left thumb and wrist pain. Carpal tunnel.  Lower back pain,from sciatica  Vitamin D  Needs to reinstate referral to Neurology and PT, OT, Speech Right hand weakness.

## 2023-04-12 NOTE — Progress Notes (Signed)
 [facility]  Catherine Kane, is a 53 y.o. female  WUX:[id number]  [id number]  DOB - [birth year]  Chief Complaint  Patient presents with   Weight Loss   Hand Pain   Medication Refill   Sciatica   Allergies       Subjective:   Catherine Kane is a 53 y.o. female here today for an acute visit. She is wanting to loose weight because the walker is being used for mobility and has a hx of carpel tunnel and left hand thumb increased pain she has a arm brace that provides no relief. Bulging disc and back locks up feels her weight is related to this. She is experiencing seasonal allergies. No problems updated.  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 18 g 2   amLODipine (NORVASC) 10 MG tablet Take 1 tablet (10 mg total) by mouth daily. 90 tablet 1   apixaban (ELIQUIS) 5 MG TABS tablet Take 1 tablet (5 mg total) by mouth 2 (two) times daily. 180 tablet 1   atorvastatin (LIPITOR) 40 MG tablet Take 1 tablet (40 mg total) by mouth daily. 90 tablet 1   cariprazine (VRAYLAR) 1.5 MG capsule Take 1 capsule (1.5 mg total) by mouth daily. 90 capsule 1   carvedilol (COREG) 12.5 MG tablet Take 1 tablet (12.5 mg total) by mouth 2 (two) times daily with a meal. 180 tablet 1   cloNIDine (CATAPRES) 0.1 MG tablet Take 1 tablet (0.1 mg total) by mouth daily. 90 tablet 1    diclofenac Sodium (VOLTAREN) 1 % GEL Apply 2 grams topically 4 (four) times daily to painful joints 100 g 1   montelukast (SINGULAIR) 10 MG tablet Take 1 tablet (10 mg total) by mouth at bedtime. 90 tablet 1   ondansetron (ZOFRAN) 4 MG tablet Take 1 tablet (4 mg total) by mouth every 8 (eight) hours as needed for nausea or vomiting. 10 tablet 0   pantoprazole (PROTONIX) 40 MG tablet Take 1 tablet (40 mg total) by mouth daily. Take 30-60 minutes before the first meal of the day. 90 tablet 1   valsartan-hydrochlorothiazide (DIOVAN-HCT) 320-25 MG tablet Take 1 tablet by mouth daily. 90 tablet 1   albuterol (PROVENTIL) (2.5 MG/3ML) 0.083% nebulizer solution Take 3 mLs (2.5 mg total) by nebulization every 6 (six) hours as needed for wheezing or shortness of breath. (Patient not taking: Reported on 04/12/2023) 180 mL 1   levETIRAcetam (KEPPRA) 500 MG tablet Take 1 tablet (500 mg total) by mouth 2 (two) times daily for 2 days. (Patient not taking: Reported on 04/12/2023) 4 tablet 0   Current Facility-Administered Medications on File Prior to Visit  Medication Dose Route Frequency Provider Last Rate Last Admin   cloNIDine (CATAPRES) tablet 0.1 mg  0.1 mg Oral Once        Health Maintenance  Topic  Date Due   Pneumococcal Vaccination (1 of 2 - PCV) Never done   Colon Cancer Screening  Never done   COVID-19 Vaccine (1 - 2024-25 season) Never done   Mammogram  12/12/2022   Zoster (Shingles) Vaccine (1 of 2) Never done   Pap with HPV screening  11/26/2023   DTaP/Tdap/Td vaccine (2 - Td or Tdap) 07/06/2024   Flu Shot  Completed   Hepatitis C Screening  Completed   HIV Screening  Completed   HPV Vaccine  Aged Out    Objective:   Vitals:   04/12/23 1410 04/12/23 1459  BP: (!) 184/113 (!) 160/92  Pulse: 84   Resp: 16   Temp: 97.7 F (36.5 C)   TempSrc: Oral   SpO2: 98%   Weight: 207 lb (93.9 kg)   Height: 5\' 3"  (1.6 m)    BP Readings from Last 3 Encounters:  04/12/23 (!) 160/92  01/11/23 (!)  190/110  05/15/22 (!) 125/100      Physical Exam Vitals reviewed.  Constitutional:      Appearance: Normal appearance. She is obese.  HENT:     Head: Normocephalic.     Right Ear: Tympanic membrane, ear canal and external ear normal.     Left Ear: Tympanic membrane, ear canal and external ear normal.     Nose: Nose normal.     Mouth/Throat:     Mouth: Mucous membranes are moist.  Eyes:     Extraocular Movements: Extraocular movements intact.     Pupils: Pupils are equal, round, and reactive to light.  Cardiovascular:     Rate and Rhythm: Normal rate and regular rhythm.  Pulmonary:     Effort: Pulmonary effort is normal.     Breath sounds: Normal breath sounds.  Abdominal:     General: Bowel sounds are normal. There is distension.     Palpations: Abdomen is soft.  Musculoskeletal:        General: Normal range of motion.     Cervical back: Normal range of motion.  Skin:    General: Skin is warm and dry.  Neurological:     Mental Status: She is alert.     Comments: Right side weakness, unstable gait and dysphasia   Psychiatric:        Mood and Affect: Mood normal.        Behavior: Behavior normal.        Thought Content: Thought content normal.     Assessment & Plan  Mickie was seen today for weight loss, hand pain, medication refill, sciatica and allergies.  Diagnoses and all orders for this visit:  Essential hypertension BP goal - < 130/80 (Take amlodipine at bedtime) Explained that having normal blood pressure is the goal and medications are helping to get to goal and maintain normal blood pressure. DIET: Limit salt intake, read nutrition labels to check salt content, limit fried and high fatty foods  Avoid using multisymptom OTC cold preparations that generally contain sudafed which can rise BP. Consult with pharmacist on best cold relief products to use for persons with HTN EXERCISE Discussed incorporating exercise such as walking - 30 minutes most days of the  week and can do in 10 minute intervals    Check blood pressure daily at the same time. Keep a log of all blood pressure readings. Sciatic pain, right -     AMB referral to orthopedics  Cerebrovascular accident (CVA), unspecified mechanism (HCC)  Seasonal allergic rhinitis due to pollen -  loratadine (CLARITIN) 10 MG tablet; Take 1 tablet (10 mg total) by mouth daily.  Class 2 severe obesity due to excess calories with serious comorbidity and body mass index (BMI) of 36.0 to 36.9 in adult [facility]) -     Amb Ref to Medical Weight Management      Patient have been counseled extensively about nutrition and exercise. Other issues discussed during this visit include: low cholesterol diet, weight control and daily exercise, foot care, annual eye examinations at Ophthalmology, importance of adherence with medications and regular follow-up. We also discussed long term complications of uncontrolled diabetes and hypertension.   Return in about 3 weeks (around 05/03/2023).  The patient was given clear instructions to go to ER or return to medical center if symptoms don't improve, worsen or new problems develop. The patient verbalized understanding. The patient was told to call to get lab results if they haven't heard anything in the next week.   This note has been created with Education officer, environmental. Any transcriptional errors are unintentional.   Grayce Sessions, NP 04/12/2023, 3:11 PM

## 2023-04-12 NOTE — Patient Instructions (Signed)
  DASH DIET; No salt or low sodium diet  If pressure if greater than 150/100 notify me here at the office.  If it's persistently greater 180/90, go to the Emergency Department.  Take all medication as prescribed.   Avoid smoked meats which are high in sodium content.  Avoid soda which contains sodium and are high in sugar which increases your risk for diabetes.

## 2023-04-12 NOTE — Addendum Note (Signed)
 Addended by: Guy Franco on: 04/12/2023 03:24 PM   Modules accepted: Orders

## 2023-04-13 ENCOUNTER — Other Ambulatory Visit: Payer: Self-pay

## 2023-04-13 MED ORDER — DICLOFENAC SODIUM 1 % EX GEL
2.0000 g | Freq: Four times a day (QID) | CUTANEOUS | 1 refills | Status: DC
  Filled 2023-04-13: qty 100, 13d supply, fill #0
  Filled 2023-04-21: qty 100, 13d supply, fill #1

## 2023-04-13 NOTE — Telephone Encounter (Signed)
 Requested medication (s) are due for refill today: yes  Requested medication (s) are on the active medication list: yes  Last refill:  01/12/23 100 g 1 RF  Future visit scheduled: with a new PCP  Notes to clinic:  can this be refilled since in between providers.    Requested Prescriptions  Pending Prescriptions Disp Refills   diclofenac Sodium (VOLTAREN) 1 % GEL 100 g 1    Sig: Apply 2 grams topically 4 (four) times daily to painful joints     Analgesics:  Topicals Failed - 04/13/2023 12:09 PM      Failed - Manual Review: Labs are only required if the patient has taken medication for more than 8 weeks.      Passed - PLT in normal range and within 360 days    Platelets  Date Value Ref Range Status  04/18/2022 280 150 - 400 K/uL Final  11/01/2021 305 150 - 450 x10E3/uL Final         Passed - HGB in normal range and within 360 days    Hemoglobin  Date Value Ref Range Status  04/18/2022 13.8 12.0 - 15.0 g/dL Final  16/11/9602 54.0 11.1 - 15.9 g/dL Final         Passed - HCT in normal range and within 360 days    HCT  Date Value Ref Range Status  04/18/2022 41.0 36.0 - 46.0 % Final   Hematocrit  Date Value Ref Range Status  11/01/2021 46.0 34.0 - 46.6 % Final         Passed - Cr in normal range and within 360 days    Creatinine  Date Value Ref Range Status  09/16/2013 0.99 0.60 - 1.30 mg/dL Final   Creatinine, Ser  Date Value Ref Range Status  04/19/2022 0.88 0.44 - 1.00 mg/dL Final         Passed - eGFR is 30 or above and within 360 days    EGFR (African American)  Date Value Ref Range Status  09/16/2013 >60  Final   GFR calc Af Amer  Date Value Ref Range Status  09/15/2019 88 >59 mL/min/1.73 Final    Comment:    **[facility] currently reports eGFR in compliance with the current**   recommendations of the [employer]. [facility] will   update reporting as new guidelines are published from the NKF-ASN   Task force.    EGFR (Non-African Amer.)   Date Value Ref Range Status  09/16/2013 >60  Final    Comment:    eGFR values <41mL/min/1.73 m2 may be an indication of chronic kidney disease (CKD). Calculated eGFR is useful in patients with stable renal function. The eGFR calculation will not be reliable in acutely ill patients when serum creatinine is changing rapidly. It is not useful in  patients on dialysis. The eGFR calculation may not be applicable to patients at the low and high extremes of body sizes, pregnant women, and vegetarians.    GFR, Estimated  Date Value Ref Range Status  04/19/2022 >60 >60 mL/min Final    Comment:    (NOTE) Calculated using the CKD-EPI Creatinine Equation (2021)    eGFR  Date Value Ref Range Status  11/01/2021 94 >59 mL/min/1.73 Final         Passed - Patient is not pregnant      Passed - Valid encounter within last 12 months    Recent Outpatient Visits           Yesterday Essential hypertension  [facility] Grayce Sessions, NP   3 months ago Encounter for immunization   [facility] Grayce Sessions, NP   1 year ago Essential hypertension   [facility] [place] Storm Frisk, MD

## 2023-04-16 ENCOUNTER — Other Ambulatory Visit: Payer: Self-pay

## 2023-04-16 ENCOUNTER — Encounter (INDEPENDENT_AMBULATORY_CARE_PROVIDER_SITE_OTHER): Payer: Self-pay | Admitting: Primary Care

## 2023-04-16 ENCOUNTER — Other Ambulatory Visit (HOSPITAL_COMMUNITY): Payer: Self-pay

## 2023-04-16 DIAGNOSIS — J449 Chronic obstructive pulmonary disease, unspecified: Secondary | ICD-10-CM

## 2023-04-17 ENCOUNTER — Other Ambulatory Visit (HOSPITAL_COMMUNITY): Payer: Self-pay

## 2023-04-17 ENCOUNTER — Encounter: Admitting: Family Medicine

## 2023-04-17 ENCOUNTER — Other Ambulatory Visit (HOSPITAL_BASED_OUTPATIENT_CLINIC_OR_DEPARTMENT_OTHER): Payer: Self-pay

## 2023-04-18 ENCOUNTER — Other Ambulatory Visit (HOSPITAL_COMMUNITY): Payer: Self-pay

## 2023-04-20 ENCOUNTER — Other Ambulatory Visit: Payer: Self-pay

## 2023-04-20 NOTE — Patient Outreach (Signed)
  Medicaid Managed Care Social Work Note  04/20/2023 Name:  Aniayah Alaniz MRN:  [id number] DOB:  1970/02/22  Kimeka Chiara Coltrin is an 53 y.o. year old female who is a primary patient of Grayce Sessions, NP.  The Medicaid Managed Care Coordination team was consulted for assistance with:  Community Resources   Ms. Hase was given information about Medicaid Managed Care Coordination team services today. Bryona Dessa Phi Patient agreed to services and verbal consent obtained.  Engaged with patient  for by telephone forinitial visit in response to referral for case management and/or care coordination services.   Patient is participating in a Managed Medicaid Plan:  Yes  Assessments/Interventions:  Review of past medical history, allergies, medications, health status, including review of consultants reports, laboratory and other test data, was performed as part of comprehensive evaluation and provision of chronic care management services.  SDOH: (Social Drivers of Health) assessments and interventions performed: SDOH Interventions    Flowsheet Row Office Visit from 04/12/2023 in [facility]  SDOH Interventions   Food Insecurity Interventions Intervention Not Indicated  Transportation Interventions Other (Comment)  Utilities Interventions Intervention Not Indicated  Physical Activity Interventions Intervention Not Indicated  Health Literacy Interventions Intervention Not Indicated      BSW completed a telephone outreach with patient. She states she and her fiancee are currently living in a hotel paying 300 weekly. She does not have any income right now, she has applied for disability and pending decision. Patient states her fiancee receives about 900 in disability each month. They do not receive foodstamps due to fiancee making too much. BSW and patient agreed for resources for food and housing to be emailed to address on file.   Advanced Directives Status:  Not addressed in this encounter.  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   Follow up:  Patient agrees to Care Plan and Follow-up.  Plan: The Managed Medicaid care management team will reach out to the patient again over the next 15 days.  Date/time of next scheduled Social Work care management/care coordination outreach:  05/11/23  Gus Puma, Kenard Gower, [facility]  Managed [facility] Social Worker [phone]

## 2023-04-20 NOTE — Patient Instructions (Signed)
 Visit Information  [name] was given information about Medicaid Managed Care team care coordination services as a part of their [facility] Medicaid benefit. [name] verbally consented to engagement with the [facility] Managed Care team.   If you are experiencing a medical emergency, please call 911 or report to your local emergency department or urgent care.   If you have a non-emergency medical problem during routine business hours, please contact your provider's office and ask to speak with a nurse.   For questions related to your [facility], please call: [phone] or visit the homepage here: [email or web address]  If you would like to schedule transportation through your [facility], please call the following number at least 2 days in advance of your appointment: [phone]   Rides for urgent appointments can also be made after hours by calling Member Services.  Call the Behavioral Health Crisis Line at [phone], at any time, 24 hours a day, 7 days a week. If you are in danger or need immediate medical attention call 911.  If you would like help to quit smoking, call 1-800-QUIT-NOW (217-318-6368) OR Espaol: 1-855-Djelo-Ya (3-329-518-8416) o para ms informacin haga clic aqu or Text READY to 606-301 to register via text  [name] - following are the goals we discussed in your visit today:   Goals Addressed   None      Social Worker will follow up in 15 days.   Gus Puma, Kenard Gower, MHA [facility] Health  Managed Medicaid Social Worker [phone]   Following is a copy of your plan of care:  There are no care plans that you recently modified to display for this patient.

## 2023-04-21 ENCOUNTER — Other Ambulatory Visit (HOSPITAL_COMMUNITY): Payer: Self-pay

## 2023-04-24 ENCOUNTER — Other Ambulatory Visit: Payer: Self-pay

## 2023-04-24 ENCOUNTER — Ambulatory Visit

## 2023-04-24 ENCOUNTER — Encounter: Admitting: Family Medicine

## 2023-04-26 ENCOUNTER — Encounter

## 2023-05-01 ENCOUNTER — Encounter

## 2023-05-03 ENCOUNTER — Ambulatory Visit (INDEPENDENT_AMBULATORY_CARE_PROVIDER_SITE_OTHER)

## 2023-05-04 ENCOUNTER — Other Ambulatory Visit (HOSPITAL_COMMUNITY): Payer: Self-pay

## 2023-05-07 ENCOUNTER — Encounter (INDEPENDENT_AMBULATORY_CARE_PROVIDER_SITE_OTHER): Payer: Self-pay

## 2023-05-07 ENCOUNTER — Ambulatory Visit (INDEPENDENT_AMBULATORY_CARE_PROVIDER_SITE_OTHER): Admitting: Primary Care

## 2023-05-07 ENCOUNTER — Ambulatory Visit

## 2023-05-07 ENCOUNTER — Other Ambulatory Visit: Payer: Self-pay

## 2023-05-07 ENCOUNTER — Other Ambulatory Visit (HOSPITAL_COMMUNITY): Payer: Self-pay

## 2023-05-07 NOTE — Progress Notes (Deleted)
   Blood Pressure Recheck Visit  Name: Catherine Kane MRN: [id number] Date of Birth: December 05, 1970  [name] presents today for Blood Pressure recheck with clinical support staff.  Order for BP recheck by Dalene Carrow, ordered on 05/07/23.   BP Readings from Last 3 Encounters:  04/12/23 (!) 160/92  01/11/23 (!) 190/110  05/15/22 (!) 125/100    Current Outpatient Medications  Medication Sig Dispense Refill   albuterol (PROVENTIL) (2.5 MG/3ML) 0.083% nebulizer solution Take 3 mLs (2.5 mg total) by nebulization every 6 (six) hours as needed for wheezing or shortness of breath. (Patient not taking: Reported on 04/12/2023) 180 mL 1   albuterol (VENTOLIN HFA) 108 (90 Base) MCG/ACT inhaler Inhale 1-2 puffs into the lungs every 6 (six) hours as needed. 18 g 2   amLODipine (NORVASC) 10 MG tablet Take 1 tablet (10 mg total) by mouth daily. 90 tablet 1   apixaban (ELIQUIS) 5 MG TABS tablet Take 1 tablet (5 mg total) by mouth 2 (two) times daily. 180 tablet 1   atorvastatin (LIPITOR) 40 MG tablet Take 1 tablet (40 mg total) by mouth daily. 90 tablet 1   cariprazine (VRAYLAR) 1.5 MG capsule Take 1 capsule (1.5 mg total) by mouth daily. 90 capsule 1   carvedilol (COREG) 12.5 MG tablet Take 1 tablet (12.5 mg total) by mouth 2 (two) times daily with a meal. 180 tablet 1   cloNIDine (CATAPRES) 0.1 MG tablet Take 1 tablet (0.1 mg total) by mouth daily. 90 tablet 1   diclofenac Sodium (VOLTAREN) 1 % GEL Apply 2 grams topically 4 (four) times daily to painful joints 100 g 1   levETIRAcetam (KEPPRA) 500 MG tablet Take 1 tablet (500 mg total) by mouth 2 (two) times daily for 2 days. (Patient not taking: Reported on 04/12/2023) 4 tablet 0   loratadine (CLARITIN) 10 MG tablet Take 1 tablet (10 mg total) by mouth daily. 90 tablet 1   montelukast (SINGULAIR) 10 MG tablet Take 1 tablet (10 mg total) by mouth at bedtime. 90 tablet 1   ondansetron (ZOFRAN) 4 MG tablet Take 1 tablet (4 mg total) by  mouth every 8 (eight) hours as needed for nausea or vomiting. 10 tablet 0   pantoprazole (PROTONIX) 40 MG tablet Take 1 tablet (40 mg total) by mouth daily. Take 30-60 minutes before the first meal of the day. 90 tablet 1   valsartan-hydrochlorothiazide (DIOVAN-HCT) 320-25 MG tablet Take 1 tablet by mouth daily. 90 tablet 1   Current Facility-Administered Medications  Medication Dose Route Frequency Provider Last Rate Last Admin   cloNIDine (CATAPRES) tablet 0.1 mg  0.1 mg Oral Once         Hypertensive Medication Review: Patient states that they {Actions; are/are not:16769} taking all their hypertensive medications as prescribed and their last dose of hypertensive medications was {LAST DOSE:27501}   Documentation of any medication adherence discrepancies: {WUJW:11914}  Provider Recommendation:  Spoke to *** and they stated: ***  Patient has been scheduled to follow up with ***   Patient has been given provider's recommendations and does not have any questions or concerns at this time. Patient will contact the office for any future questions or concerns.

## 2023-05-08 ENCOUNTER — Other Ambulatory Visit (INDEPENDENT_AMBULATORY_CARE_PROVIDER_SITE_OTHER): Payer: Self-pay | Admitting: Primary Care

## 2023-05-08 ENCOUNTER — Other Ambulatory Visit (HOSPITAL_COMMUNITY): Payer: Self-pay

## 2023-05-08 DIAGNOSIS — R4702 Dysphasia: Secondary | ICD-10-CM

## 2023-05-08 DIAGNOSIS — R531 Weakness: Secondary | ICD-10-CM

## 2023-05-08 NOTE — Progress Notes (Signed)
 Patient checked in but sthen left

## 2023-05-11 ENCOUNTER — Other Ambulatory Visit: Payer: Self-pay

## 2023-05-11 NOTE — Patient Outreach (Signed)
  Medicaid Managed Care Social Work Note  05/11/2023 Name:  Catherine Kane MRN:  [id number] DOB:  [birth year]  Catherine Kane is an 53 y.o. year old female who is a primary patient of Grayce Sessions, NP.  The Medicaid Managed Care Coordination team was consulted for assistance with:  Community Resources   Ms. Moynahan was given information about Medicaid Managed Care Coordination team services today. Jayana Dessa Phi Patient agreed to services and verbal consent obtained.  Engaged with patient  for by telephone forfollow up visit in response to referral for case management and/or care coordination services.   Patient is participating in a Managed Medicaid Plan:  Yes  Assessments/Interventions:  Review of past medical history, allergies, medications, health status, including review of consultants reports, laboratory and other test data, was performed as part of comprehensive evaluation and provision of chronic care management services.  SDOH: (Social Drivers of Health) assessments and interventions performed: SDOH Interventions    Flowsheet Row Office Visit from 04/12/2023 in [facility]  SDOH Interventions   Food Insecurity Interventions Intervention Not Indicated  Transportation Interventions Other (Comment)  Utilities Interventions Intervention Not Indicated  Physical Activity Interventions Intervention Not Indicated  Health Literacy Interventions Intervention Not Indicated     BSW completed a telephone outreach with patient, she stats she did receive the resources BSW sent. Patient states she was able to get some food with the resources as well as make a dental and eye appointment. Patient and SO are still living in the hotel. Patient reports no additional resources are needed at this time. BSW provided contact information for any future needs.   Advanced Directives Status:  Not addressed in this encounter.  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   Follow up:  Patient requests no follow-up at this time.  Plan: The  Patient has been provided with contact information for the Managed Medicaid care management team and has been advised to call with any health related questions or concerns.    Abelino Derrick, MHA [facility] Health  Managed [facility] Social Worker [phone]

## 2023-05-11 NOTE — Patient Instructions (Signed)
Visit Information  Catherine Kane was given information about Medicaid Managed Care team care coordination services as a part of their [facility] Community Plan Medicaid benefit. Catherine Kane verbally consented to engagement with the [facility] Managed Care team.   If you are experiencing a medical emergency, please call 911 or report to your local emergency department or urgent care.   If you have a non-emergency medical problem during routine business hours, please contact your provider's office and ask to speak with a nurse.   For questions related to your [facility], please call: [phone] or visit the homepage here: [email or web address]  If you would like to schedule transportation through your [facility], please call the following number at least 2 days in advance of your appointment: [phone]   Rides for urgent appointments can also be made after hours by calling Member Services.  Call the Behavioral Health Crisis Line at [phone]) [phone], at any time, 24 hours a day, 7 days a week. If you are in danger or need immediate medical attention call 911.  If you would like help to quit smoking, call 1-800-QUIT-NOW ((765) 694-7197) OR Espaol: 1-855-Djelo-Ya (7-253-664-4034) o para ms informacin haga clic aqu or Text READY to 742-595 to register via text  Catherine Kane - following are the goals we discussed in your visit today:   Goals Addressed   None       The  Patient                                              has been provided with contact information for the Managed Medicaid care management team and has been advised to call with any health related questions or concerns.   Catherine Kane, Catherine Kane, MHA [facility]  Managed Medicaid Social Worker [phone]   Following is a copy of your plan of care:  There are no care plans that you recently modified  to display for this patient.

## 2023-05-16 ENCOUNTER — Ambulatory Visit

## 2023-05-16 ENCOUNTER — Ambulatory Visit: Admitting: Physical Therapy

## 2023-05-16 NOTE — Telephone Encounter (Signed)
 Will forward to provider

## 2023-05-16 NOTE — Therapy (Deleted)
 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   Vitamin B12 deficiency 09/2019   Vitamin D deficiency 10/2018   Well woman exam with routine gynecological exam 11/26/2018   Past Surgical History:  Procedure Laterality Date   TONSILLECTOMY     TUBAL LIGATION     Patient Active Problem List   Diagnosis Date Noted   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  Rationale for Evaluation and Treatment: {HABREHAB:27488}  SUBJECTIVE:   SUBJECTIVE STATEMENT: *** Pt accompanied by: {accompnied:27141}  PERTINENT HISTORY: ***  PAIN:  Are you having pain? {OPRCPAIN:27236}  FALLS: Has patient fallen in last 6 months?  {NWGNFAOZ:30865}  LIVING ENVIRONMENT: Lives with: {OPRC lives with:25569::"lives with their family"} Lives in: {Lives in:25570}  PLOF:  Level of assistance: {HQIONGE:95284} Employment: {SLPemployment:25674}  PATIENT GOALS: ***  OBJECTIVE:  Note: Objective measures were completed at Evaluation unless otherwise noted.  DIAGNOSTIC FINDINGS: ***  COGNITION: Overall cognitive status: {cognition:24006} Areas of impairment:  {cognitiveimpairmentslp:27409} Functional deficits:  ***  AUDITORY COMPREHENSION: Overall auditory comprehension: {IMPAIRED:25374} YES/NO questions: {IMPAIRED:25374} Following directions: {IMPAIRED:25374} Conversation: {SLP conversation:25430} Interfering components: {SLP interfering components:25431} Effective technique: {SLP effective technique:25432}  READING COMPREHENSION: {SLPreadingcomprehension:27140}  EXPRESSION: {SLP EXPRESSION:25433}  VERBAL EXPRESSION: Level of generative/spontaneous verbalization: {SLP level of generative/spontaneious verbalization:25435} Automatic speech: {SLP ATOMIC SPEECH:25434}  Repetition: {SLPrepetion:27212} Naming: {SLPnaming:27214} Pragmatics: {slppragmatics:27216} Comments: *** Interfering components: {SLP INTERFERING COMPONENTS:25436} Effective technique: {SLP EFFECTIVE TECHNIQUE:25437} Non-verbal means of communication: {SLP non verbal means of communication:25438}  WRITTEN EXPRESSION: Dominant hand: {RIGHT/LEFT:20294} Written expression: {slpwrittenexp:27209}  MOTOR SPEECH: Overall motor speech: {slpimpaired:27210} Level of impairment: {SLP level of impairment:25441} Respiration: {respbreathing:27195} Phonation: {SLP phonation:25439} Resonance: {SLP resonance:25440} Articulation: {SLParticulation:27218} Intelligibility: {SLP Intelligible:25442} Motor planning: {slpmotorspeecherrors:27220} Motor speech errors: {SLP motor speech errors:25443} Interfering components: {SLP Interfering components (MS):25444} Effective technique: {SLP effective technique (MS):25445}  ORAL MOTOR EXAMINATION: Overall status: {OMESLP2:27645} Comments: ***  RECOMMENDATIONS FROM OBJECTIVE SWALLOW STUDY (MBSS/FEES):  *** Objective swallow impairments: *** Objective recommended compensations: ***  CLINICAL SWALLOW ASSESSMENT:   Current diet: {slpdiet:27196} Dentition: {dentition:27197} Patient directly observed with POs: {POobserved:27199} Feeding: {slp feeding:27200} Liquids provided by:  {SLPliquids:27201} Oral phase signs and symptoms: {SLPoralphase:27202} Pharyngeal phase signs and symptoms: {SLPpharyngealphase:27203} Comments: ***  STANDARDIZED ASSESSMENTS: {SLPstandardizedassessment:27092}  PATIENT REPORTED OUTCOME MEASURES (PROM): {SLPPROM:27095}  TREATMENT DATE: ***   PATIENT EDUCATION: Education details: *** Person educated: {Person educated:25204} Education method: {Education Method:25205} Education comprehension: {Education Comprehension:25206}   GOALS: Goals reviewed with patient? {yes/no:20286}  SHORT TERM GOALS: Target date: ***  *** Baseline: Goal status: INITIAL  2.  *** Baseline:  Goal status: INITIAL  3.  *** Baseline:  Goal status: INITIAL  4.  *** Baseline:  Goal status: INITIAL  5.  *** Baseline:  Goal status: INITIAL  6.  *** Baseline:  Goal status: INITIAL  LONG TERM GOALS: Target date: ***  *** Baseline:  Goal status: INITIAL  2.  *** Baseline:  Goal status: INITIAL  3.  *** Baseline:  Goal status: INITIAL  4.  *** Baseline:  Goal status: INITIAL  5.  *** Baseline:  Goal status: INITIAL  6.  *** Baseline:  Goal status: INITIAL  ASSESSMENT:  CLINICAL IMPRESSION: Patient is a *** y.o. *** who was seen today for ***.   OBJECTIVE IMPAIRMENTS: include {SLPOBJIMP:27107}. These impairments are limiting patient from {SLPLIMIT:27108}. Factors affecting potential to achieve goals and functional outcome are {SLP factors:25450}. Patient will benefit from skilled SLP services to address above impairments and improve overall function.  REHAB POTENTIAL: {rehabpotential:25112}  PLAN:  SLP FREQUENCY: {rehab frequency:25116}  SLP DURATION: {rehab duration:25117}  PLANNED INTERVENTIONS: {SLP treatment/interventions:25449}    Gracy Racer, CCC-SLP 05/16/2023, 7:56  AM

## 2023-05-20 MED ORDER — TIOTROPIUM BROMIDE MONOHYDRATE 18 MCG IN CAPS
18.0000 ug | ORAL_CAPSULE | Freq: Every day | RESPIRATORY_TRACT | 12 refills | Status: AC
  Filled 2023-05-20 – 2023-05-21 (×2): qty 30, 30d supply, fill #0
  Filled 2023-06-11 – 2023-06-13 (×2): qty 30, 30d supply, fill #1
  Filled 2023-07-08: qty 30, 30d supply, fill #2
  Filled 2023-08-14: qty 30, 30d supply, fill #3
  Filled 2023-09-04 – 2023-09-07 (×2): qty 30, 30d supply, fill #4
  Filled 2023-10-07: qty 30, 30d supply, fill #5
  Filled 2023-11-08: qty 30, 30d supply, fill #6

## 2023-05-21 ENCOUNTER — Other Ambulatory Visit (INDEPENDENT_AMBULATORY_CARE_PROVIDER_SITE_OTHER): Payer: Self-pay | Admitting: Primary Care

## 2023-05-21 ENCOUNTER — Other Ambulatory Visit: Payer: Self-pay

## 2023-05-21 ENCOUNTER — Telehealth (INDEPENDENT_AMBULATORY_CARE_PROVIDER_SITE_OTHER): Payer: Self-pay | Admitting: Primary Care

## 2023-05-21 ENCOUNTER — Ambulatory Visit: Admitting: Physical Therapy

## 2023-05-21 ENCOUNTER — Other Ambulatory Visit (HOSPITAL_COMMUNITY): Payer: Self-pay

## 2023-05-21 NOTE — Telephone Encounter (Signed)
 Called to confirm appt. Pt will be present

## 2023-05-22 ENCOUNTER — Telehealth: Admitting: Family Medicine

## 2023-05-22 ENCOUNTER — Other Ambulatory Visit: Payer: Self-pay

## 2023-05-22 DIAGNOSIS — K121 Other forms of stomatitis: Secondary | ICD-10-CM

## 2023-05-22 MED ORDER — CHLORHEXIDINE GLUCONATE 0.12 % MT SOLN
15.0000 mL | Freq: Two times a day (BID) | OROMUCOSAL | 0 refills | Status: DC
  Filled 2023-05-22: qty 473, 16d supply, fill #0

## 2023-05-22 NOTE — Progress Notes (Signed)
E-Visit for Mouth Ulcers  We are sorry that you are not feeling well.  Here is how we plan to help!  Based on what you have shared with me, it appears that you do have mouth ulcer(s).     The following medications should decrease the discomfort and help with healing. Chlorhexidine mouthwash daily for 3 days   Mouth ulcers are painful areas in the mouth and gums. These are also known as "canker sores".  They can occur anywhere inside the mouth. While mostly harmless, mouth ulcers can be extremely uncomfortable and may make it difficult to eat, drink, and brush your teeth.  You may have more than 1 ulcer and they can vary and change in size. Mouth ulcers are not contagious and should not be confused with cold sores.  Cold sores appear on the lip or around the outside of the mouth and often begin with a tingling, burning or itching sensation.   While the exact causes are unknown, some common causes and factors that may aggravate mouth ulcers include: Genetics - Sometimes mouth ulcers run in families High alcohol intake Acidic foods such as citrus fruits like pineapple, grapefruit, orange fruits/juices, may aggravate mouth ulcers Other foods high in acidity or spice such as coffee, chocolate, chips, pretzels, eggs, nuts, cheese Quitting smoking Injury caused by biting the tongue or inside of the cheek Diet lacking in B-12, zinc, folic acid or iron Female hormone shifts with menstruation Excessive fatigue, emotional stress or anxiety Prevention: Talk to your doctor if you are taking meds that are known to cause mouth ulcers such as:   Anti-inflammatory drugs (for example Ibuprofen, Naproxen sodium), pain killers, Beta blockers, Oral nicotine replacement drugs, Some street drugs (heroin).   Avoid allowing any tablets to dissolve in your mouth that are meant to swallowed whole Avoid foods/drinks that trigger or worsen symptoms Keep your mouth clean with daily brushing and flossing  Home  Care: The goal with treatment is to ease the pain where ulcers occur and help them heal as quickly as possible.  There is no medical treatment to prevent mouth ulcers from coming back or recurring.  Avoid spicy and acidic foods Eat soft foods and avoid rough, crunchy foods Avoid chewing gum Do not use toothpaste that contains sodium lauryl sulphite Use a straw to drink which helps avoid liquids toughing the ulcers near the front of your mouth Use a very soft toothbrush If you have dentures or dental hardware that you feel is not fitting well or contributing to his, please see your dentist. Use saltwater mouthwash which helps healing. Dissolve a  teaspoon of salt in a glass of warm water. Swish around your mouth and spit it out. This can be used as needed if it is soothing.   GET HELP RIGHT AWAY IF: Persistent ulcers require checking IN PERSON (face to face). Any mouth lesion lasting longer than a month should be seen by your DENTIST as soon as possible for evaluation for possible oral cancer. If you have a non-painful ulcer in 1 or more areas of your mouth Ulcers that are spreading, are very large or particularly painful Ulcers last longer than one week without improving on treatment If you develop a fever, swollen glands and begin to feel unwell Ulcers that developed after starting a new medication MAKE SURE YOU: Understand these instructions. Will watch your condition. Will get help right away if you are not doing well or get worse.  Thank you for choosing an e-visit.  Your e-visit answers were reviewed by a board certified advanced clinical practitioner to complete your personal care plan. Depending upon the condition, your plan could have included both over the counter or prescription medications.  Please review your pharmacy choice. Make sure the pharmacy is open so you can pick up prescription now. If there is a problem, you may contact your provider through [employer] and  have the prescription routed to another pharmacy.  Your safety is important to [place]. If you have drug allergies check your prescription carefully.   For the next 24 hours you can use MyChart to ask questions about today's visit, request a non-urgent call back, or ask for a work or school excuse. You will get an email in the next two days asking about your experience. I hope that your e-visit has been valuable and will speed your recovery.   I provided 5 minutes of non face-to-face time during this encounter for chart review, medication and order placement, as well as and documentation.

## 2023-05-28 ENCOUNTER — Ambulatory Visit (INDEPENDENT_AMBULATORY_CARE_PROVIDER_SITE_OTHER): Admitting: Primary Care

## 2023-06-03 ENCOUNTER — Other Ambulatory Visit (INDEPENDENT_AMBULATORY_CARE_PROVIDER_SITE_OTHER): Payer: Self-pay | Admitting: Primary Care

## 2023-06-03 MED ORDER — DICLOFENAC SODIUM 1 % EX GEL
2.0000 g | Freq: Four times a day (QID) | CUTANEOUS | 1 refills | Status: DC
  Filled 2023-06-03: qty 100, 13d supply, fill #0
  Filled 2023-06-14: qty 100, 13d supply, fill #1

## 2023-06-04 ENCOUNTER — Other Ambulatory Visit: Payer: Self-pay

## 2023-06-04 ENCOUNTER — Other Ambulatory Visit (HOSPITAL_COMMUNITY): Payer: Self-pay

## 2023-06-05 ENCOUNTER — Telehealth (INDEPENDENT_AMBULATORY_CARE_PROVIDER_SITE_OTHER): Payer: Self-pay | Admitting: Primary Care

## 2023-06-05 NOTE — Telephone Encounter (Signed)
 Called pt to confirm appt. Pt will be present.

## 2023-06-06 ENCOUNTER — Other Ambulatory Visit (HOSPITAL_COMMUNITY): Payer: Self-pay

## 2023-06-08 ENCOUNTER — Other Ambulatory Visit (HOSPITAL_COMMUNITY): Payer: Self-pay

## 2023-06-08 ENCOUNTER — Telehealth: Admitting: Family Medicine

## 2023-06-08 DIAGNOSIS — J029 Acute pharyngitis, unspecified: Secondary | ICD-10-CM

## 2023-06-08 MED ORDER — AMOXICILLIN 875 MG PO TABS
875.0000 mg | ORAL_TABLET | Freq: Two times a day (BID) | ORAL | 0 refills | Status: AC
  Filled 2023-06-08: qty 20, 10d supply, fill #0

## 2023-06-08 NOTE — Progress Notes (Signed)
 E-Visit for Sore Throat - Strep Symptoms  We are sorry that you are not feeling well.  Here is how we plan to help!  Based on what you have shared with me it is likely that you have strep pharyngitis.  Strep pharyngitis is inflammation and infection in the back of the throat.  This is an infection cause by bacteria and is treated with antibiotics.  I have prescribed Amoxicillin 500 mg twice a day for 10 days. For throat pain, we recommend over the counter oral pain relief medications such as acetaminophen or aspirin, or anti-inflammatory medications such as ibuprofen or naproxen sodium. Topical treatments such as oral throat lozenges or sprays may be used as needed. Strep infections are not as easily transmitted as other respiratory infections, however we still recommend that you avoid close contact with loved ones, especially the very young and elderly.  Remember to wash your hands thoroughly throughout the day as this is the number one way to prevent the spread of infection and wipe down door knobs and counters with disinfectant.   Home Care: Only take medications as instructed by your medical team. Complete the entire course of an antibiotic. Do not take these medications with alcohol. A steam or ultrasonic humidifier can help congestion.  You can place a towel over your head and breathe in the steam from hot water coming from a faucet. Avoid close contacts especially the very young and the elderly. Cover your mouth when you cough or sneeze. Always remember to wash your hands.  Get Help Right Away If: You develop worsening fever or sinus pain. You develop a severe head ache or visual changes. Your symptoms persist after you have completed your treatment plan.  Make sure you Understand these instructions. Will watch your condition. Will get help right away if you are not doing well or get worse.   Thank you for choosing an e-visit.  Your e-visit answers were reviewed by a board  certified advanced clinical practitioner to complete your personal care plan. Depending upon the condition, your plan could have included both over the counter or prescription medications.  Please review your pharmacy choice. Make sure the pharmacy is open so you can pick up prescription now. If there is a problem, you may contact your provider through [employer] and have the prescription routed to another pharmacy.  Your safety is important to [place]. If you have drug allergies check your prescription carefully.   For the next 24 hours you can use MyChart to ask questions about today's visit, request a non-urgent call back, or ask for a work or school excuse. You will get an email in the next two days asking about your experience. I hope that your e-visit has been valuable and will speed your recovery.    have provided 5 minutes of non face to face time during this encounter for chart review and documentation.

## 2023-06-12 ENCOUNTER — Telehealth (INDEPENDENT_AMBULATORY_CARE_PROVIDER_SITE_OTHER): Payer: Self-pay | Admitting: Primary Care

## 2023-06-12 ENCOUNTER — Other Ambulatory Visit (HOSPITAL_COMMUNITY): Payer: Self-pay

## 2023-06-12 ENCOUNTER — Other Ambulatory Visit: Payer: Self-pay

## 2023-06-12 NOTE — Telephone Encounter (Signed)
 Called pt to confirm appt. Pt hung up the phone. Please advise

## 2023-06-13 ENCOUNTER — Encounter (INDEPENDENT_AMBULATORY_CARE_PROVIDER_SITE_OTHER): Payer: Self-pay | Admitting: Primary Care

## 2023-06-13 ENCOUNTER — Other Ambulatory Visit: Payer: Self-pay

## 2023-06-13 ENCOUNTER — Other Ambulatory Visit (HOSPITAL_COMMUNITY): Payer: Self-pay

## 2023-06-13 ENCOUNTER — Ambulatory Visit (INDEPENDENT_AMBULATORY_CARE_PROVIDER_SITE_OTHER): Admitting: Primary Care

## 2023-06-13 VITALS — BP 144/106 | HR 76 | Resp 16 | Wt 207.6 lb

## 2023-06-13 DIAGNOSIS — I639 Cerebral infarction, unspecified: Secondary | ICD-10-CM | POA: Diagnosis not present

## 2023-06-13 DIAGNOSIS — F331 Major depressive disorder, recurrent, moderate: Secondary | ICD-10-CM

## 2023-06-13 DIAGNOSIS — I10 Essential (primary) hypertension: Secondary | ICD-10-CM

## 2023-06-13 DIAGNOSIS — F419 Anxiety disorder, unspecified: Secondary | ICD-10-CM | POA: Diagnosis not present

## 2023-06-13 DIAGNOSIS — E66812 Obesity, class 2: Secondary | ICD-10-CM | POA: Diagnosis not present

## 2023-06-13 DIAGNOSIS — E782 Mixed hyperlipidemia: Secondary | ICD-10-CM | POA: Diagnosis not present

## 2023-06-13 DIAGNOSIS — N920 Excessive and frequent menstruation with regular cycle: Secondary | ICD-10-CM | POA: Diagnosis not present

## 2023-06-13 DIAGNOSIS — E876 Hypokalemia: Secondary | ICD-10-CM | POA: Diagnosis not present

## 2023-06-13 DIAGNOSIS — Z23 Encounter for immunization: Secondary | ICD-10-CM | POA: Diagnosis not present

## 2023-06-13 DIAGNOSIS — Z6836 Body mass index (BMI) 36.0-36.9, adult: Secondary | ICD-10-CM

## 2023-06-13 MED ORDER — CLONIDINE HCL 0.2 MG PO TABS
0.2000 mg | ORAL_TABLET | Freq: Every day | ORAL | 1 refills | Status: DC
  Filled 2023-06-13: qty 90, 90d supply, fill #0
  Filled 2023-09-04: qty 90, 90d supply, fill #1

## 2023-06-13 MED ORDER — HYDROCHLOROTHIAZIDE 25 MG PO TABS
25.0000 mg | ORAL_TABLET | Freq: Every day | ORAL | 1 refills | Status: DC
  Filled 2023-06-13 – 2023-06-14 (×2): qty 90, 90d supply, fill #0
  Filled 2023-09-04: qty 90, 90d supply, fill #1

## 2023-06-13 MED ORDER — AMLODIPINE BESYLATE 10 MG PO TABS
10.0000 mg | ORAL_TABLET | Freq: Every day | ORAL | 1 refills | Status: DC
  Filled 2023-06-13 – 2023-07-09 (×2): qty 90, 90d supply, fill #0
  Filled 2023-10-07: qty 90, 90d supply, fill #1

## 2023-06-13 MED ORDER — VALSARTAN-HYDROCHLOROTHIAZIDE 160-25 MG PO TABS
1.0000 | ORAL_TABLET | Freq: Every day | ORAL | 3 refills | Status: DC
  Filled 2023-06-13: qty 90, 90d supply, fill #0

## 2023-06-13 NOTE — Patient Instructions (Signed)
 Pneumococcal Conjugate Vaccine: What You Need to Know Many vaccine information statements are available in Spanish and other languages. See PromoAge.com.br. 1. Why get vaccinated? Pneumococcal conjugate vaccine can prevent pneumococcal disease. Pneumococcal disease refers to any illness caused by pneumococcal bacteria. These bacteria can cause many types of illnesses, including pneumonia, which is an infection of the lungs. Pneumococcal bacteria are one of the most common causes of pneumonia. Besides pneumonia, pneumococcal bacteria can also cause: Ear infections Sinus infections Meningitis (infection of the tissue covering the brain and spinal cord) Bacteremia (infection of the blood) Anyone can get pneumococcal disease, but children under 8 years old, people with certain medical conditions or other risk factors, and adults 65 years or older are at the highest risk. Most pneumococcal infections are mild. However, some can result in long-term problems, such as brain damage or hearing loss. Meningitis, bacteremia, and pneumonia caused by pneumococcal disease can be fatal. 2. Pneumococcal conjugate vaccine Pneumococcal conjugate vaccine helps protect against bacteria that cause pneumococcal disease. There are three pneumococcal conjugate vaccines (PCV13, PCV15, and PCV20). The different vaccines are recommended for different people based on age and medical status. Your health care provider can help you determine which type of pneumococcal conjugate vaccine, and how many doses, you should receive. Infants and young children usually need 4 doses of pneumococcal conjugate vaccine. These doses are recommended at 2, 4, 6, and 13-82 months of age. Older children and adolescents might need pneumococcal conjugate vaccine depending on their age and medical conditions or other risk factors if they did not receive the recommended doses as infants or young children. Adults 19 through 52 years old with certain  medical conditions or other risk factors who have not already received pneumococcal conjugate vaccine should receive pneumococcal conjugate vaccine. Adults 65 years or older who have not previously received pneumococcal conjugate vaccine should receive pneumococcal conjugate vaccine. Some people with certain medical conditions are also recommended to receive pneumococcal polysaccharide vaccine (a different type of pneumococcal vaccine known as PPSV23). Some adults who have previously received a pneumococcal conjugate vaccine may be recommended to receive another pneumococcal conjugate vaccine. 3. Talk with your health care provider Tell your vaccination provider if the person getting the vaccine: Has had an allergic reaction after a previous dose of any type of pneumococcal conjugate vaccine (PCV13, PCV15, PCV20, or an earlier pneumococcal conjugate vaccine known as PCV7), or to any vaccine containing diphtheria toxoid (for example, DTaP), or has any severe, life-threatening allergies In some cases, your health care provider may decide to postpone pneumococcal conjugate vaccination until a future visit. People with minor illnesses, such as a cold, may be vaccinated. People who are moderately or severely ill should usually wait until they recover. Your health care provider can give you more information. 4. Risks of a vaccine reaction Redness, swelling, pain, or tenderness where the shot is given, and fever, loss of appetite, fussiness (irritability), feeling tired, headache, muscle aches, joint pain, and chills can happen after pneumococcal conjugate vaccination. Young children may be at increased risk for seizures caused by fever after a pneumococcal conjugate vaccine if it is administered at the same time as inactivated influenza vaccine. Ask your health care provider for more information. People sometimes faint after medical procedures, including vaccination. Tell your provider if you feel dizzy or  have vision changes or ringing in the ears. As with any medicine, there is a very remote chance of a vaccine causing a severe allergic reaction, other serious injury, or death. 5.  What if there is a serious problem? An allergic reaction could occur after the vaccinated person leaves the clinic. If you see signs of a severe allergic reaction (hives, swelling of the face and throat, difficulty breathing, a fast heartbeat, dizziness, or weakness), call 9-1-1 and get the person to the nearest hospital. For other signs that concern you, call your health care provider. Adverse reactions should be reported to the Vaccine Adverse Event Reporting System (VAERS). Your health care provider will usually file this report, or you can do it yourself. Visit the VAERS website at www.vaers.LAgents.no or call 6027509363. VAERS is only for reporting reactions, and VAERS staff members do not give medical advice. 6. The National Vaccine Injury Compensation Program The [employer] Vaccine Injury Compensation Program (VICP) is a federal program that was created to compensate people who may have been injured by certain vaccines. Claims regarding alleged injury or death due to vaccination have a time limit for filing, which may be as short as two years. Visit the VICP website at SpiritualWord.at or call 515-134-0943 to learn about the program and about filing a claim. 7. How can I learn more? Ask your health care provider. Call your local or state health department. Visit the website of the Food and Drug Administration (FDA) for vaccine package inserts and additional information at FinderList.no. Contact the Centers for Disease Control and Prevention (CDC): Call 713-429-2390 (1-800-CDC-INFO) or Visit CDC's website at PicCapture.uy. Source: CDC Vaccine Information Statement (Interim) Pneumococcal Conjugate Vaccine (06/17/2021) This same material is available at  FootballExhibition.com.br for no charge. This information is not intended to replace advice given to you by your health care provider. Make sure you discuss any questions you have with your health care provider. Document Revised: 05/10/2022 Document Reviewed: 02/13/2022 Elsevier Patient Education  2024 Elsevier Inc.  Zoster Vaccine Injection What is this medication? ZOSTER VACCINE (ZOS ter vak SEEN) reduces the risk of herpes zoster (shingles). It does not treat shingles. It is still possible to get shingles after receiving the vaccine, but the symptoms may be less severe or not last as long. It works by helping your immune system learn how to fight off a future infection. This medicine may be used for other purposes; ask your health care provider or pharmacist if you have questions. COMMON BRAND NAME(S): [facility] What should I tell my care team before I take this medication? They need to know if you have any of these conditions: Cancer Immune system problems An unusual or allergic reaction to Zoster vaccine, other medications, foods, dyes, or preservatives Pregnant or trying to get pregnant Breastfeeding How should I use this medication? This vaccine is injected into a muscle. It is given by your care team. This vaccine requires 2 doses to get the full benefit. Set a reminder for when your next dose is due. A copy of Vaccine Information Statements will be given before each vaccination. Be sure to read this information carefully each time. This sheet may change often. Talk to your care team about the use of this vaccine in children. This vaccine is not approved for use in children. Overdosage: If you think you have taken too much of this medicine contact a poison control center or emergency room at once. NOTE: This medicine is only for you. Do not share this medicine with others. What if I miss a dose? Keep appointments for follow-up (booster) doses. It is important not to miss your dose. Call your care  team if you are unable to keep an appointment.  What may interact with this medication? Medications that suppress your immune system Medications to treat cancer Steroid medications, such as prednisone  or cortisone This list may not describe all possible interactions. Give your health care provider a list of all the medicines, herbs, non-prescription drugs, or dietary supplements you use. Also tell them if you smoke, drink alcohol, or use illegal drugs. Some items may interact with your medicine. What should I watch for while using this medication? Visit your care team regularly. This vaccine, like all vaccines, may not fully protect everyone. What side effects may I notice from receiving this medication? Side effects that you should report to your care team as soon as possible: Allergic reactions--skin rash, itching, hives, swelling of the face, lips, tongue, or throat Side effects that usually do not require medical attention (report these to your care team if they continue or are bothersome): Chills Fatigue Feeling faint or lightheaded Fever Headache Muscle pain Pain, redness, or irritation at injection site This list may not describe all possible side effects. Call your doctor for medical advice about side effects. You may report side effects to FDA at 1-800-FDA-1088. Where should I keep my medication? This vaccine is only given by your care team. It will not be stored at home. NOTE: This sheet is a summary. It may not cover all possible information. If you have questions about this medicine, talk to your doctor, pharmacist, or health care provider.  2024 Elsevier/Gold Standard (2021-07-14 00:00:00)

## 2023-06-13 NOTE — Progress Notes (Signed)
 [facility]  Catherine Kane, is a 53 y.o. female  NWG:[id number]  [id number]:[id number]  DOB - 1971/01/07  Chief Complaint  Patient presents with   Blood Pressure Check   Weight Management Screening    Started discussing it last visit 04/12/23    Depression    Pt is currently on Vraylar  and pt states it's not really working. Pt states she still has problems sleeping at night because her brain is on the go when she is trying to sleep    Anxiety       Subjective:   [name] is a 53 y.o. obese female right side weakness uses a walker for gait stability (she is accompanied by her fianc Catherine Kane and is giving him permission to be present and participate in her visit here today for a follow up visit.  Blood pressure is elevated 144/106 -she has a blood pressure machine and takes her blood pressures at home and their readings are different.  Her systolic ranges from 130-140 diastolic 90-104. patient has No headache, No chest pain, No abdominal pain - No Nausea, No new weakness tingling or numbness, shortness of breath.  Patient has had a dry cough ongoing for the last month.  Will discontinue valsartan .  Follow-up blood pressure and reevaluate cough.  No problems updated.  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  (Patient not taking: Reported on 04/12/2023) 180 mL 1   albuterol  (VENTOLIN  HFA) 108 (90 Base) MCG/ACT inhaler Inhale 1-2 puffs into the lungs every 6 (six) hours as needed. 18 g 2   amoxicillin  (AMOXIL ) 875 MG tablet Take 1 tablet (875 mg total) by mouth 2 (two) times daily for 10 days. 20 tablet 0   apixaban  (ELIQUIS ) 5 MG TABS tablet Take 1 tablet (5 mg total) by mouth 2 (two) times daily. 180 tablet 1   atorvastatin  (LIPITOR) 40 MG tablet Take 1 tablet (40 mg total) by mouth daily. 90 tablet 1   cariprazine  (VRAYLAR ) 1.5 MG capsule Take 1 capsule (1.5 mg total) by mouth daily. 90 capsule 1   carvedilol  (COREG ) 12.5 MG tablet Take 1 tablet (12.5 mg total) by mouth 2 (two) times daily with a meal. 180 tablet 1   chlorhexidine  (PERIDEX ) 0.12 % solution Use as directed 15 mLs in the mouth or throat 2 (two) times daily. 473 mL 0   diclofenac  Sodium (VOLTAREN ) 1 % GEL Apply 2 grams topically 4 (four) times daily to painful joints 100 g 1   levETIRAcetam  (KEPPRA ) 500 MG tablet Take 1 tablet (500 mg total) by mouth 2 (two) times daily for 2 days. (Patient not taking: Reported on 04/12/2023) 4 tablet 0   loratadine  (CLARITIN ) 10 MG tablet Take 1 tablet (10 mg total) by mouth daily. 90 tablet 1   montelukast  (SINGULAIR ) 10  MG tablet Take 1 tablet (10 mg total) by mouth at bedtime. 90 tablet 1   ondansetron  (ZOFRAN ) 4 MG tablet Take 1 tablet (4 mg total) by mouth every 8 (eight) hours as needed for nausea or vomiting. 10 tablet 0   pantoprazole  (PROTONIX ) 40 MG tablet Take 1 tablet (40 mg total) by mouth daily. Take 30-60 minutes before the first meal of the day. 90 tablet 1   tiotropium (SPIRIVA  HANDIHALER) 18 MCG inhalation capsule Place 1 capsule (18 mcg total) into inhaler and inhale daily. 30 capsule 12   No current facility-administered medications on file prior to visit.   Health Maintenance  Topic Date Due   Pneumococcal Vaccination (1 of 2 - PCV) Never done   Colon Cancer Screening  Never done    COVID-19 Vaccine (1 - 2024-25 season) Never done   Mammogram  12/12/2022   Zoster (Shingles) Vaccine (1 of 2) Never done   Flu Shot  09/07/2023   Pap with HPV screening  11/26/2023   DTaP/Tdap/Td vaccine (2 - Td or Tdap) 07/06/2024   Hepatitis C Screening  Completed   HIV Screening  Completed   HPV Vaccine  Aged Out   Meningitis B Vaccine  Aged Out    Objective:   Vitals:   06/13/23 1402 06/13/23 1405  BP: (!) 149/107 (!) 144/106  Pulse: 76   Resp: 16   SpO2: 96%   Weight: 207 lb 9.6 oz (94.2 kg)    BP Readings from Last 3 Encounters:  06/13/23 (!) 144/106  04/12/23 (!) 160/92  01/11/23 (!) 190/110   Physical Exam Vitals reviewed.  Constitutional:      Appearance: Normal appearance. She is obese.  HENT:     Head: Normocephalic.     Right Ear: Tympanic membrane, ear canal and external ear normal.     Left Ear: Tympanic membrane, ear canal and external ear normal.     Nose: Congestion present.     Comments: Right greater than left boggy swollen turbinates    Mouth/Throat:     Mouth: Mucous membranes are moist.  Eyes:     Extraocular Movements: Extraocular movements intact.     Pupils: Pupils are equal, round, and reactive to light.  Cardiovascular:     Rate and Rhythm: Normal rate.  Pulmonary:     Effort: Pulmonary effort is normal.     Breath sounds: Normal breath sounds.  Abdominal:     General: Bowel sounds are normal.     Palpations: Abdomen is soft.  Musculoskeletal:        General: Normal range of motion.     Cervical back: Normal range of motion.  Skin:    General: Skin is warm and dry.  Neurological:     Mental Status: She is alert and oriented to person, place, and time.  Psychiatric:        Mood and Affect: Mood normal.        Behavior: Behavior normal.        Thought Content: Thought content normal.    Assessment & Plan  Catherine Kane was seen today for blood pressure check, weight management screening, depression and anxiety.  Diagnoses and all  orders for this visit:  Essential hypertension BP goal - < 130/80 Explained that having normal blood pressure is the goal and medications are helping to get to goal and maintain normal blood pressure. DIET: Limit salt intake, read nutrition labels to check salt content, limit fried and high fatty foods  Avoid  using multisymptom OTC cold preparations that generally contain sudafed which can rise BP. Consult with pharmacist on best cold relief products to use for persons with HTN EXERCISE Discussed incorporating exercise such as walking - 30 minutes most days of the week and can do in 10 minute intervals    -     CMP14+EGFR -     cloNIDine  (CATAPRES ) 0.2 MG tablet; Take 1 tablet (0.2 mg total) by mouth daily. -     Discontinue: valsartan -hydrochlorothiazide  (DIOVAN -HCT) 160-25 MG tablet; Take 1 tablet by mouth daily. -     amLODipine  (NORVASC ) 10 MG tablet; Take 1 tablet (10 mg total) by mouth daily.    Hypokalemia -     CMP14+EGFR  Mixed hyperlipidemia -     Lipid Panel  Major depressive disorder, recurrent episode, moderate 2/2 Anxiety Health and living situation will add Seroquel 25 mg reevaluate 4 to 6 weeks.     06/13/2023    2:00 PM 04/12/2023    2:17 PM 01/11/2023   11:30 AM 09/30/2018   11:26 AM 05/21/2018    1:47 PM  Depression screen PHQ 2/9  Decreased Interest 2 1 3  0 0  Down, Depressed, Hopeless 3  3 0 0  PHQ - 2 Score 5 1 6  0 0  Altered sleeping 3 2 3     Tired, decreased energy 3 2 3     Change in appetite 2 1 2     Feeling bad or failure about yourself  2 1 2     Trouble concentrating 3 1 2     Moving slowly or fidgety/restless 3 1 3     Suicidal thoughts 0 0 0    PHQ-9 Score 21 9 21     Difficult doing work/chores Very difficult Somewhat difficult     And referred to psych  Cerebrovascular accident (CVA), unspecified mechanism (HCC) -     Lipid Panel  Class 2 severe obesity due to excess calories with serious comorbidity and body mass index (BMI) of 36.0 to 36.9 in  adult [facility]) Defer weight management until blood pressure is better controlled and labs are reviewed.  Menorrhagia with regular cycle -     CBC with Differential  Other orders -     hydrochlorothiazide  (HYDRODIURIL ) 25 MG tablet; Take 1 tablet (25 mg total) by mouth daily.     Patient have been counseled extensively about nutrition and exercise. Other issues discussed during this visit include: low cholesterol diet, weight control and daily exercise, foot care, annual eye examinations at Ophthalmology, importance of adherence with medications and regular follow-up. We also discussed long term complications of uncontrolled diabetes and hypertension.   Return in about 4 weeks (around 07/11/2023) for BP_/ anxiety/ depression .  The patient was given clear instructions to go to ER or return to medical center if symptoms don't improve, worsen or new problems develop. The patient verbalized understanding. The patient was told to call to get lab results if they haven't heard anything in the next week.   This note has been created with Education officer, environmental. Any transcriptional errors are unintentional.   Marius Siemens, NP 06/13/2023, 2:54 PM

## 2023-06-14 ENCOUNTER — Other Ambulatory Visit (HOSPITAL_COMMUNITY): Payer: Self-pay

## 2023-06-14 ENCOUNTER — Other Ambulatory Visit: Payer: Self-pay

## 2023-06-14 ENCOUNTER — Other Ambulatory Visit (INDEPENDENT_AMBULATORY_CARE_PROVIDER_SITE_OTHER): Payer: Self-pay | Admitting: Primary Care

## 2023-06-14 DIAGNOSIS — F331 Major depressive disorder, recurrent, moderate: Secondary | ICD-10-CM

## 2023-06-14 LAB — LIPID PANEL
Chol/HDL Ratio: 4.4 ratio (ref 0.0–4.4)
Cholesterol, Total: 205 mg/dL — ABNORMAL HIGH (ref 100–199)
HDL: 47 mg/dL (ref >39)
LDL Chol Calc (NIH): 136 mg/dL — ABNORMAL HIGH (ref 0–99)
Triglycerides: 123 mg/dL (ref 0–149)
VLDL Cholesterol Cal: 22 mg/dL (ref 5–40)

## 2023-06-14 LAB — CMP14+EGFR
ALT: 23 IU/L (ref 0–32)
AST: 20 IU/L (ref 0–40)
Albumin: 4.6 g/dL (ref 3.9–4.9)
Alkaline Phosphatase: 115 IU/L (ref 44–121)
BUN/Creatinine Ratio: 9 (ref 9–23)
BUN: 7 mg/dL (ref 6–24)
Bilirubin Total: 0.2 mg/dL (ref 0.0–1.2)
CO2: 22 mmol/L (ref 20–29)
Calcium: 9.9 mg/dL (ref 8.7–10.2)
Chloride: 101 mmol/L (ref 96–106)
Creatinine, Ser: 0.76 mg/dL (ref 0.57–1.00)
Globulin, Total: 3.4 g/dL (ref 1.5–4.5)
Glucose: 111 mg/dL — ABNORMAL HIGH (ref 70–99)
Potassium: 3.9 mmol/L (ref 3.5–5.2)
Sodium: 138 mmol/L (ref 134–144)
Total Protein: 8 g/dL (ref 6.0–8.5)
eGFR: 95 mL/min/{1.73_m2} (ref >59)

## 2023-06-14 LAB — CBC WITH DIFFERENTIAL/PLATELET
Basophils Absolute: 0.1 10*3/uL (ref 0.0–0.2)
Basos: 2 %
EOS (ABSOLUTE): 0.1 10*3/uL (ref 0.0–0.4)
Eos: 2 %
Hematocrit: 48.2 % — ABNORMAL HIGH (ref 34.0–46.6)
Hemoglobin: 15.2 g/dL (ref 11.1–15.9)
Immature Grans (Abs): 0 10*3/uL (ref 0.0–0.1)
Immature Granulocytes: 0 %
Lymphocytes Absolute: 1.6 10*3/uL (ref 0.7–3.1)
Lymphs: 35 %
MCH: 26.8 pg (ref 26.6–33.0)
MCHC: 31.5 g/dL (ref 31.5–35.7)
MCV: 85 fL (ref 79–97)
Monocytes Absolute: 0.4 10*3/uL (ref 0.1–0.9)
Monocytes: 8 %
Neutrophils Absolute: 2.5 10*3/uL (ref 1.4–7.0)
Neutrophils: 53 %
Platelets: 413 10*3/uL (ref 150–450)
RBC: 5.67 x10E6/uL — ABNORMAL HIGH (ref 3.77–5.28)
RDW: 14.7 % (ref 11.7–15.4)
WBC: 4.6 10*3/uL (ref 3.4–10.8)

## 2023-06-15 ENCOUNTER — Other Ambulatory Visit (HOSPITAL_COMMUNITY): Payer: Self-pay

## 2023-06-15 NOTE — Telephone Encounter (Signed)
 Am I able to refill ?

## 2023-06-16 ENCOUNTER — Encounter (INDEPENDENT_AMBULATORY_CARE_PROVIDER_SITE_OTHER): Payer: Self-pay | Admitting: Primary Care

## 2023-06-16 ENCOUNTER — Other Ambulatory Visit (INDEPENDENT_AMBULATORY_CARE_PROVIDER_SITE_OTHER): Payer: Self-pay | Admitting: Primary Care

## 2023-06-16 DIAGNOSIS — E782 Mixed hyperlipidemia: Secondary | ICD-10-CM

## 2023-06-16 MED ORDER — ATORVASTATIN CALCIUM 40 MG PO TABS
40.0000 mg | ORAL_TABLET | Freq: Every day | ORAL | 1 refills | Status: DC
  Filled 2023-06-16 – 2023-07-09 (×2): qty 90, 90d supply, fill #0
  Filled 2023-10-07: qty 90, 90d supply, fill #1

## 2023-06-18 ENCOUNTER — Other Ambulatory Visit: Payer: Self-pay

## 2023-06-18 ENCOUNTER — Other Ambulatory Visit (HOSPITAL_COMMUNITY): Payer: Self-pay

## 2023-06-19 ENCOUNTER — Other Ambulatory Visit (HOSPITAL_COMMUNITY): Payer: Self-pay

## 2023-06-19 NOTE — Telephone Encounter (Signed)
 Will forward to provider

## 2023-06-20 ENCOUNTER — Other Ambulatory Visit (HOSPITAL_COMMUNITY): Payer: Self-pay

## 2023-06-20 MED ORDER — CARIPRAZINE HCL 1.5 MG PO CAPS
1.5000 mg | ORAL_CAPSULE | Freq: Every day | ORAL | 1 refills | Status: DC
  Filled 2023-06-20: qty 90, 90d supply, fill #0
  Filled 2023-06-27: qty 30, 30d supply, fill #0

## 2023-06-20 MED ORDER — APIXABAN 5 MG PO TABS
5.0000 mg | ORAL_TABLET | Freq: Two times a day (BID) | ORAL | 1 refills | Status: DC
  Filled 2023-06-20 – 2023-07-09 (×3): qty 180, 90d supply, fill #0
  Filled 2023-09-07 – 2023-10-06 (×4): qty 180, 90d supply, fill #1

## 2023-06-21 ENCOUNTER — Other Ambulatory Visit: Payer: Self-pay

## 2023-06-25 ENCOUNTER — Other Ambulatory Visit (HOSPITAL_COMMUNITY): Payer: Self-pay

## 2023-06-26 ENCOUNTER — Encounter (HOSPITAL_COMMUNITY): Payer: Self-pay

## 2023-06-27 ENCOUNTER — Other Ambulatory Visit (HOSPITAL_COMMUNITY): Payer: Self-pay

## 2023-06-27 ENCOUNTER — Other Ambulatory Visit: Payer: Self-pay

## 2023-07-08 ENCOUNTER — Other Ambulatory Visit (INDEPENDENT_AMBULATORY_CARE_PROVIDER_SITE_OTHER): Payer: Self-pay | Admitting: Primary Care

## 2023-07-08 DIAGNOSIS — R059 Cough, unspecified: Secondary | ICD-10-CM

## 2023-07-08 DIAGNOSIS — J449 Chronic obstructive pulmonary disease, unspecified: Secondary | ICD-10-CM

## 2023-07-08 DIAGNOSIS — R0602 Shortness of breath: Secondary | ICD-10-CM

## 2023-07-08 DIAGNOSIS — I10 Essential (primary) hypertension: Secondary | ICD-10-CM

## 2023-07-09 ENCOUNTER — Other Ambulatory Visit: Payer: Self-pay

## 2023-07-09 ENCOUNTER — Other Ambulatory Visit (INDEPENDENT_AMBULATORY_CARE_PROVIDER_SITE_OTHER): Payer: Self-pay | Admitting: Primary Care

## 2023-07-09 ENCOUNTER — Other Ambulatory Visit (HOSPITAL_COMMUNITY): Payer: Self-pay

## 2023-07-09 DIAGNOSIS — I10 Essential (primary) hypertension: Secondary | ICD-10-CM

## 2023-07-09 MED ORDER — PANTOPRAZOLE SODIUM 40 MG PO TBEC
40.0000 mg | DELAYED_RELEASE_TABLET | Freq: Every day | ORAL | 1 refills | Status: DC
  Filled 2023-07-09: qty 90, 90d supply, fill #0
  Filled 2023-10-07: qty 90, 90d supply, fill #1

## 2023-07-09 MED ORDER — MONTELUKAST SODIUM 10 MG PO TABS
10.0000 mg | ORAL_TABLET | Freq: Every day | ORAL | 1 refills | Status: DC
  Filled 2023-07-09: qty 90, 90d supply, fill #0
  Filled 2023-10-07: qty 90, 90d supply, fill #1

## 2023-07-10 ENCOUNTER — Other Ambulatory Visit: Payer: Self-pay

## 2023-07-10 ENCOUNTER — Other Ambulatory Visit (HOSPITAL_COMMUNITY): Payer: Self-pay

## 2023-07-10 ENCOUNTER — Telehealth (INDEPENDENT_AMBULATORY_CARE_PROVIDER_SITE_OTHER): Payer: Self-pay | Admitting: Primary Care

## 2023-07-10 MED ORDER — CARVEDILOL 12.5 MG PO TABS
12.5000 mg | ORAL_TABLET | Freq: Two times a day (BID) | ORAL | 1 refills | Status: DC
  Filled 2023-07-10: qty 180, 90d supply, fill #0
  Filled 2023-10-07: qty 180, 90d supply, fill #1

## 2023-07-10 NOTE — Telephone Encounter (Signed)
 Called pt to confirm appt.Pt did not answer and could not LVM.

## 2023-07-10 NOTE — Telephone Encounter (Signed)
 Requested Prescriptions  Pending Prescriptions Disp Refills   carvedilol  (COREG ) 12.5 MG tablet 180 tablet 1    Sig: Take 1 tablet (12.5 mg total) by mouth 2 (two) times daily with a meal.     Cardiovascular: Beta Blockers 3 Failed - 07/10/2023  4:22 PM      Failed - Last BP in normal range    BP Readings from Last 1 Encounters:  06/13/23 (!) 144/106         Passed - Cr in normal range and within 360 days    Creatinine  Date Value Ref Range Status  09/16/2013 0.99 0.60 - 1.30 mg/dL Final   Creatinine, Ser  Date Value Ref Range Status  06/13/2023 0.76 0.57 - 1.00 mg/dL Final         Passed - AST in normal range and within 360 days    AST  Date Value Ref Range Status  06/13/2023 20 0 - 40 IU/L Final   SGOT(AST)  Date Value Ref Range Status  09/16/2013 21 15 - 37 Unit/L Final         Passed - ALT in normal range and within 360 days    ALT  Date Value Ref Range Status  06/13/2023 23 0 - 32 IU/L Final   SGPT (ALT)  Date Value Ref Range Status  09/16/2013 19 U/L Final    Comment:    14-63 NOTE: New Reference Range 08/26/13          Passed - Last Heart Rate in normal range    Pulse Readings from Last 1 Encounters:  06/13/23 76         Passed - Valid encounter within last 6 months    Recent Outpatient Visits           3 weeks ago Essential hypertension   [place] [facility] Marius Siemens, NP   2 months ago Essential hypertension   [place] [facility] Marius Siemens, NP   6 months ago Encounter for immunization    [facility] Marius Siemens, NP   1 year ago Essential hypertension    [facility] Vernell Goldsmith, MD

## 2023-07-11 ENCOUNTER — Other Ambulatory Visit (HOSPITAL_COMMUNITY): Payer: Self-pay

## 2023-07-11 ENCOUNTER — Ambulatory Visit (INDEPENDENT_AMBULATORY_CARE_PROVIDER_SITE_OTHER): Admitting: Primary Care

## 2023-07-16 ENCOUNTER — Other Ambulatory Visit (HOSPITAL_COMMUNITY): Payer: Self-pay

## 2023-07-18 ENCOUNTER — Ambulatory Visit: Admitting: Pulmonary Disease

## 2023-07-24 ENCOUNTER — Ambulatory Visit

## 2023-07-24 ENCOUNTER — Telehealth (INDEPENDENT_AMBULATORY_CARE_PROVIDER_SITE_OTHER): Payer: Self-pay | Admitting: Primary Care

## 2023-07-24 NOTE — Progress Notes (Deleted)
 Psychiatric Initial Adult Assessment  Date: 07/26/2023, 11:40 AM  Patient Identification: Catherine Kane MRN: [id number] DOB: [birth year]  Referral Source: Marius Siemens, NP ***  ASSESSMENT / PLAN  Antanisha Arrow Emmerich is a 53 y.o. female with PMH of anxiety, MDD, stimulant use disorder-cocaine, COPD, CVA, HTN, dyslipidemia, *** suicide attempt, *** inpt psych admission, who presented in person for psychiatric evaluation of MDD and anxiety. PCP started patient on cariprazine  1.5 mg. Previously been on fluoxetine  and bupropion . 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    Risk Assessment A suicide and violence risk assessment was performed as part of this evaluation. There patient is deemed to be at chronic elevated risk for self-harm/suicide given the following factors: {SABSUICIDERISKFACTORS:29780}. These risk factors are mitigated by the following factors: {SABSUICIDEPROTECTIVEFACTORS:29779}. The patient is deemed to be at chronic elevated risk for violence given the following factors: {SABVIOLENCERISKFACTORS:29781}. These risk factors are mitigated by the following factors: {SABVIOLENCEPROTECTIVEFACTORS:29782}. There is no *** acute risk for suicide or violence at this time. The patient was educated about relevant modifiable risk factors including following recommendations for treatment of psychiatric illness and abstaining from substance abuse.  While future psychiatric events cannot be accurately predicted, the patient does not *** currently require  acute inpatient psychiatric care and does not *** currently meet [facility]  involuntary commitment criteria.    There are no diagnoses linked to this encounter.  Follow-up on: 07/31/2023  Future Appointments  Date Time Provider Department Center  07/24/2023  2:50 PM [facility]  07/25/2023  4:10 PM Marius Siemens, NP [facility] None  07/26/2023   8:30 AM Augusta Blizzard, MD [facility] None  07/27/2023  9:00 AM Maire Scot, MD [facility] None  07/30/2023  2:30 PM Shauna Del, DO [facility] None  07/31/2023 11:00 AM Meg Spina Dietrich Fragmin, LCSW [facility] None     Patient was given contact information for behavioral health clinic and was instructed to call 911 for emergencies.     HISTORY OF PRESENT ILLNESS  Chief Complaint: No chief complaint on file.    ***  Patient amenable to *** after discussing the risks, benefits, and side effects. Otherwise patient had no other questions or concerns and was amenable to plan per above.  Patient's main concern: ***  Patient's goal: ***  Safety: ***. Patient contracted to safety, would call ***. Patient *** aware of [facility], 988 and 911 as well.  *** access to guns or weapons.  Current outpatient therapist: *** Current rx: ***  ROS   PSYCH ROS  Depression: *** depressed mood and pervasive sadness, anhedonia, insomnia***, hypersomnia***, guilt, decreased energy, decreased concentration, decreased*** or increased*** appetite, psychomotor slowing*** restlessness***, and suicidal ideation or intentions Duration of Depression Symptoms: No data recorded (Hypo-) Mania: *** excessive energy despite decreased need for sleep or persistent irritability (<2hr/night x4-7days), grandiosity/inflated self-esteem, sexual indiscretion, racing thoughts, pressured speech, distractibility/inattention. Anxiety: *** having difficulty controlling/managing anxiety/worry/stress and that it is out of proportion with stressors. *** associated sxs of restlessness, being on edge, easily fatigued, concentration difficulty, irritability, muscle tension, sleep disturbance.  Psychosis:  *** AVH, delusions, paranoia, first rank symptoms.  Trauma:  Trauma: *** life-threatening, physical, sexual, witnessed *** flashbacks, nightmares, hypervigilance/hyperarousal, avoidance.  Acute stress d/o 0-3d *** Adjustment d/o 3d-47mo PTSD  >71mo  Eating: ***  *** intense fear of gaining weight, perception of being fat, restricting to lose weight. *** overeating in one sitting, unable to stop eating or losing track of time  while eating, compensates by restricting, over-exercising, vomiting, laxative misuse, diuretic misuse.   Mild: 1 to 3 episodes of inappropriate compensatory behaviours per week. Moderate: 4 to 7 episodes  Severe: 8 to 13 episodes  Extreme: 14 or more episodes   S: Do you ever make yourself sick because you feel uncomfortably full? C: Do you worry you have lost control over how much you eat? O: Have you recently lost more than one stone [14 pounds/6.4kg] in a 3 month period? F: Do you believe yourself to be fat when others say you are too thin? F: Would you say that food dominates your life?   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  Head trauma: *** Seizures: *** Allergies: Grass pollen(k-o-r-t-swt vern)   Family Psychiatric History:  Suicide: *** Homicide: *** Hospitalization: *** BiPD: *** SCZ/SCzA: *** Others: ***  Social History:  Living with: *** Income: *** Marital Status: *** Children: *** Support: *** Guns/Weapons: *** Legal: *** DUI/DWI: *** Jail/prison: *** Developmental: ***  Substance Use History: EtOH:  reports current alcohol use of about 1.0 standard drink of alcohol per week.*** Nicotine:  reports that she has never smoked. She has never used smokeless tobacco.*** THC/CBD: *** IV drug use: *** Stimulants: *** Opiates:  *** Sedative/hypnotics: *** Hallucinogens: *** Seizures: *** DT: *** Detox: *** Residential: ***  Substance Abuse History in the last 12 months:  {yes no:314532}    Past Medical History:  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   Vitamin B12 deficiency 09/2019   Vitamin D  deficiency 10/2018   Well woman exam with routine gynecological exam 11/26/2018    Past Surgical History:  Procedure Laterality Date   TONSILLECTOMY     TUBAL LIGATION      Family History:  Family History  Problem Relation Age of Onset   Heart disease Mother    Heart disease Sister    Breast cancer Maternal Aunt    Breast cancer Maternal Grandmother     Social History:   Social History   Socioeconomic History   Marital status: Single    Spouse name: Not on file   Number of children: Not on file   Years of education: Not on file   Highest education level: 12th grade  Occupational History   Not on file  Tobacco Use   Smoking status: Never   Smokeless tobacco: Never  Vaping Use   Vaping status: Never Used  Substance and Sexual Activity   Alcohol use: Yes    Alcohol/week: 1.0 standard drink of alcohol    Types: 1 Glasses of wine per week    Comment: one glass once a month   Drug use: No   Sexual activity: Yes    Birth control/protection: Surgical  Other Topics Concern   Not on file  Social History Narrative   Not on file   Social Drivers of Health   Financial Resource Strain: High Risk (07/09/2023)   Overall Financial Resource Strain (CARDIA)  Difficulty of Paying Living Expenses: Very hard  Food Insecurity: Food Insecurity Present (07/09/2023)   Hunger Vital Sign    Worried About Running Out of Food in the Last Year: Often true    Ran Out of Food in the Last Year: Often true  Transportation Needs: Unmet Transportation Needs (07/09/2023)    PRAPARE - Administrator, Civil Service (Medical): Yes    Lack of Transportation (Non-Medical): No  Physical Activity: Inactive (07/09/2023)   Exercise Vital Sign    Days of Exercise per Week: 0 days    Minutes of Exercise per Session: 0 min  Stress: Stress Concern Present (07/09/2023)   [employer] of Occupational Health - Occupational Stress Questionnaire    Feeling of Stress : Very much  Social Connections: Moderately Isolated (07/09/2023)   Social Connection and Isolation Panel    Frequency of Communication with Friends and Family: More than three times a week    Frequency of Social Gatherings with Friends and Family: Once a week    Attends Religious Services: Never    Database administrator or Organizations: No    Attends Engineer, structural: Not on file    Marital Status: Living with partner    Allergies:  Allergies  Allergen Reactions   Grass Pollen(K-O-R-T-Swt Vern)     Current Medications: Current Outpatient Medications  Medication Sig Dispense Refill   albuterol  (PROVENTIL ) (2.5 MG/3ML) 0.083% nebulizer solution Take 3 mLs (2.5 mg total) by nebulization every 6 (six) hours as needed for wheezing or shortness of breath. (Patient not taking: Reported on 04/12/2023) 180 mL 1   albuterol  (VENTOLIN  HFA) 108 (90 Base) MCG/ACT inhaler Inhale 1-2 puffs into the lungs every 6 (six) hours as needed. 18 g 2   amLODipine  (NORVASC ) 10 MG tablet Take 1 tablet (10 mg total) by mouth daily. 90 tablet 1   apixaban  (ELIQUIS ) 5 MG TABS tablet Take 1 tablet (5 mg total) by mouth 2 (two) times daily. 180 tablet 1   atorvastatin  (LIPITOR) 40 MG tablet Take 1 tablet (40 mg total) by mouth daily. 90 tablet 1   cariprazine  (VRAYLAR ) 1.5 MG capsule Take 1 capsule (1.5 mg total) by mouth daily. 90 capsule 1   carvedilol  (COREG ) 12.5 MG tablet Take 1 tablet (12.5 mg total) by mouth 2 (two) times daily with a meal. 180 tablet 1   chlorhexidine  (PERIDEX ) 0.12 % solution Use as  directed 15 mLs in the mouth or throat 2 (two) times daily. 473 mL 0   cloNIDine  (CATAPRES ) 0.2 MG tablet Take 1 tablet (0.2 mg total) by mouth daily. 90 tablet 1   diclofenac  Sodium (VOLTAREN ) 1 % GEL Apply 2 grams topically 4 (four) times daily to painful joints 100 g 1   hydrochlorothiazide  (HYDRODIURIL ) 25 MG tablet Take 1 tablet (25 mg total) by mouth daily. 90 tablet 1   levETIRAcetam  (KEPPRA ) 500 MG tablet Take 1 tablet (500 mg total) by mouth 2 (two) times daily for 2 days. (Patient not taking: Reported on 04/12/2023) 4 tablet 0   loratadine  (CLARITIN ) 10 MG tablet Take 1 tablet (10 mg total) by mouth daily. 90 tablet 1   montelukast  (SINGULAIR ) 10 MG tablet Take 1 tablet (10 mg total) by mouth at bedtime. 90 tablet 1   ondansetron  (ZOFRAN ) 4 MG tablet Take 1 tablet (4 mg total) by mouth every 8 (eight) hours as needed for nausea or vomiting. 10 tablet 0   pantoprazole  (PROTONIX ) 40 MG tablet Take 1 tablet (  40 mg total) by mouth daily. Take 30-60 minutes before the first meal of the day. 90 tablet 1   tiotropium (SPIRIVA  HANDIHALER) 18 MCG inhalation capsule Place 1 capsule (18 mcg total) into inhaler and inhale daily. 30 capsule 12   No current facility-administered medications for this visit.        OBJECTIVE  There were no vitals taken for this visit.  Psychiatric Specialty Exam: General Appearance: Casual, faily groomed, not guarded ***  Eye Contact:  Good  Speech:  Clear, coherent, normal rate, non-pressured  Volume:  Normal  Mood:  ***  Affect:  Appropriate, congruent, full range   Thought Content: Logical, rumination. No command or non-command AVH, paranoid delusions, first rank sxs.   Suicidal Thoughts:  Denied active and passive SI  Homicidal Thoughts:  Denied active and passive HI  Thought Process:  Coherent, goal-directed, mostly linear, circumstantial at times ***  Orientation:  A&Ox4  Memory:  Immediate good  Judgement:  Fair  Insight:  Fair, shallow   Concentration:  Attention and concentration good ***  Recall:  [employer] of Knowledge:  Fair  Language:  Good, no aphasia  Psychomotor Activity:  Normal  Akathisia:  NA, no antipsychotics ***  AIMS (if indicated):  NA, no antipsychotics   ***  Assets:  {Assets (PAA):22698}  ADL's:  Intact  Cognition:  WNL  Sleep:  ***     Wt Readings from Last 3 Encounters:  06/13/23 207 lb 9.6 oz (94.2 kg)  04/12/23 207 lb (93.9 kg)  01/11/23 211 lb 3.2 oz (95.8 kg)   Temp Readings from Last 3 Encounters:  04/12/23 97.7 F (36.5 C) (Oral)  04/19/22 97.9 F (36.6 C) (Oral)  04/12/22 98.7 F (37.1 C) (Oral)   BP Readings from Last 3 Encounters:  06/13/23 (!) 144/106  04/12/23 (!) 160/92  01/11/23 (!) 190/110   Pulse Readings from Last 3 Encounters:  06/13/23 76  04/12/23 84  01/11/23 88     Physical Exam  Strength & Muscle Tone: {desc; muscle tone:32375} Gait & Station: {PE GAIT ED AOZH:08657}  Screenings:  CAGE-AID    Flowsheet Row ED to Hosp-Admission (Discharged) from 04/13/2022 in [facility]  CAGE-AID Score 0   GAD-7    Flowsheet Row Office Visit from 06/13/2023 in [facility] Office Visit from 04/12/2023 in [facility]  Total GAD-7 Score 21 19   PHQ2-9    Flowsheet Row Office Visit from 06/13/2023 in [facility] Office Visit from 04/12/2023 in [facility] Office Visit from 01/11/2023 in [facility] Office Visit from 09/30/2018 in [facility] Of [facility] Office Visit from 05/21/2018 in [facility] Of [facility]  PHQ-2 Total Score 5 1 6  0 0  PHQ-9 Total Score 21 9 21  -- --   Flowsheet Row ED to Hosp-Admission (Discharged) from 04/13/2022 in [facility] ED from 04/12/2022 in [facility] ED from 04/11/2022 in [facility] at [facility]  C-SSRS RISK CATEGORY No Risk No Risk No Risk    Collaboration of Care: Case discussed with current outpatient attending, see attending's attestation for additional information   Signed: Augusta Blizzard, MD

## 2023-07-24 NOTE — Telephone Encounter (Signed)
 Called pt to confirm appt.Pt did not answer and could not LVM.

## 2023-07-25 ENCOUNTER — Encounter (INDEPENDENT_AMBULATORY_CARE_PROVIDER_SITE_OTHER): Payer: Self-pay

## 2023-07-25 ENCOUNTER — Ambulatory Visit (INDEPENDENT_AMBULATORY_CARE_PROVIDER_SITE_OTHER): Admitting: Primary Care

## 2023-07-26 ENCOUNTER — Ambulatory Visit (HOSPITAL_COMMUNITY): Admitting: Student

## 2023-07-27 ENCOUNTER — Encounter: Payer: Self-pay | Admitting: Internal Medicine

## 2023-07-27 ENCOUNTER — Ambulatory Visit: Admitting: Internal Medicine

## 2023-07-27 ENCOUNTER — Other Ambulatory Visit (HOSPITAL_COMMUNITY): Payer: Self-pay

## 2023-07-27 NOTE — Progress Notes (Deleted)
 OV 07/27/2023  Subjective:  Patient ID: Catherine Kane, female , DOB: 19-Aug-1970 , age 53 y.o. , MRN: [id number] , ADDRESS: [address] [place] [address] PCP Marius Siemens, NP Patient Care Team: Marius Siemens, NP as PCP - General (Internal Medicine)  This Provider for this visit:     07/27/2023 -  No chief complaint on file.    HPI Catherine Kane 53 y.o. -    CT Chest data from date: ****  - personally visualized and independently interpreted : *** - my findings are: ***   PFT      No data to display             LAB RESULTS last 96 hours No results found.       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   reports that she has never smoked. She has never used smokeless tobacco.  Past Surgical History:  Procedure Laterality Date   TONSILLECTOMY     TUBAL LIGATION      Allergies  Allergen Reactions   Grass Pollen(K-O-R-T-Swt Vern)     Immunization History  Administered Date(s) Administered   Influenza, Seasonal, Injecte, Preservative Fre 01/11/2023   Influenza,inj,Quad PF,6+ Mos 04/16/2017, 11/01/2018, 11/01/2021   PNEUMOCOCCAL CONJUGATE-20 06/13/2023   Tdap 07/07/2014   Zoster Recombinant(Shingrix ) 06/13/2023    Family History  Problem Relation Age of Onset   Heart disease Mother    Heart disease Sister    Breast cancer Maternal Aunt    Breast cancer Maternal Grandmother      Current Outpatient Medications:    albuterol  (PROVENTIL ) (2.5 MG/3ML) 0.083% nebulizer solution, Take 3 mLs (2.5 mg total) by nebulization every 6 (six) hours as needed for wheezing or shortness of breath. (Patient not taking: Reported on  04/12/2023), Disp: 180 mL, Rfl: 1   albuterol  (VENTOLIN  HFA) 108 (90 Base) MCG/ACT inhaler, Inhale 1-2 puffs into the lungs every 6 (six) hours as needed., Disp: 18 g, Rfl: 2   amLODipine  (NORVASC ) 10 MG tablet, Take 1 tablet (10 mg total) by mouth daily., Disp: 90 tablet, Rfl: 1   apixaban  (ELIQUIS ) 5 MG TABS tablet, Take 1 tablet (5 mg total) by mouth 2 (two) times daily., Disp: 180 tablet, Rfl: 1   atorvastatin  (LIPITOR) 40 MG tablet, Take 1 tablet (40 mg total) by mouth daily., Disp: 90 tablet, Rfl: 1   cariprazine  (VRAYLAR ) 1.5 MG capsule, Take 1 capsule (1.5 mg total) by mouth daily., Disp: 90 capsule, Rfl: 1   carvedilol  (COREG ) 12.5 MG tablet, Take 1 tablet (12.5 mg total) by mouth 2 (two) times daily with a meal., Disp: 180 tablet, Rfl: 1   chlorhexidine  (PERIDEX ) 0.12 % solution, Use as directed 15 mLs in the mouth or throat 2 (two) times daily., Disp: 473 mL, Rfl: 0   cloNIDine  (CATAPRES ) 0.2 MG tablet, Take 1 tablet (0.2 mg total) by mouth daily., Disp: 90 tablet, Rfl: 1   diclofenac  Sodium (VOLTAREN ) 1 % GEL, Apply 2 grams topically 4 (four) times daily to painful joints, Disp: 100 g, Rfl: 1   hydrochlorothiazide  (HYDRODIURIL ) 25 MG tablet, Take 1 tablet (25 mg total) by mouth daily., Disp: 90 tablet, Rfl: 1   levETIRAcetam  (KEPPRA ) 500 MG tablet, Take 1 tablet (500 mg total) by mouth 2 (  two) times daily for 2 days. (Patient not taking: Reported on 04/12/2023), Disp: 4 tablet, Rfl: 0   loratadine  (CLARITIN ) 10 MG tablet, Take 1 tablet (10 mg total) by mouth daily., Disp: 90 tablet, Rfl: 1   montelukast  (SINGULAIR ) 10 MG tablet, Take 1 tablet (10 mg total) by mouth at bedtime., Disp: 90 tablet, Rfl: 1   ondansetron  (ZOFRAN ) 4 MG tablet, Take 1 tablet (4 mg total) by mouth every 8 (eight) hours as needed for nausea or vomiting., Disp: 10 tablet, Rfl: 0   pantoprazole  (PROTONIX ) 40 MG tablet, Take 1 tablet (40 mg total) by mouth daily. Take 30-60 minutes before the first meal of the day., Disp:  90 tablet, Rfl: 1   tiotropium (SPIRIVA  HANDIHALER) 18 MCG inhalation capsule, Place 1 capsule (18 mcg total) into inhaler and inhale daily., Disp: 30 capsule, Rfl: 12      Objective:   There were no vitals filed for this visit.  Estimated body mass index is 36.77 kg/m as calculated from the following:   Height as of 04/12/23: 5' 3 (1.6 m).   Weight as of 06/13/23: 207 lb 9.6 oz (94.2 kg).  @WEIGHTCHANGE @  There were no vitals filed for this visit.   Physical Exam   General: No distress. *** O2 at rest: *** Cane present: *** Sitting in wheel chair: *** Frail: *** Obese: *** Neuro: Alert and Oriented x 3. GCS 15. Speech normal Psych: Pleasant Resp:  Barrel Chest - ***.  Wheeze - ***, Crackles - ***, No overt respiratory distress CVS: Normal heart sounds. Murmurs - *** Ext: Stigmata of Connective Tissue Disease - *** HEENT: Normal upper airway. PEERL +. No post nasal drip        Assessment:     No diagnosis found.     Plan:     There are no Patient Instructions on file for this visit.   FOLLOWUP No follow-ups on file.    SIGNATURE    Dr. Maire Scot, M.D., F.C.C.P,  Pulmonary and Critical Care Medicine Staff Physician, [facility] Director - Interstitial Lung Disease  Program  Pulmonary Fibrosis [facility] at [facility] [place], [place], [address]  Pager: [phone], If no answer or between  15:00h - 7:00h: call [phone] Telephone: [phone]  8:24 AM 07/27/2023   Moderate Complexity MDM OFFICE  2021 E/M guidelines, first released in 2021, with minor revisions added in 2023 and 2024 Must meet the requirements for 2 out of 3 dimensions to qualify.    Number and complexity of problems addressed Amount and/or complexity of data reviewed Risk of complications and/or morbidity  One or more chronic illness with mild exacerbation, OR progression, OR  side effects of treatment  Two or more stable  chronic illnesses  One undiagnosed new problem with uncertain prognosis  One acute illness with systemic symptoms   One Acute complicated injury Must meet the requirements for 1 of 3 of the categories)  Category 1: Tests and documents, historian  Any combination of 3 of the following:  Assessment requiring an independent historian  Review of prior external note(s) from each unique source  Review of results of each unique test  Ordering of each unique test    Category 2: Interpretation of tests   Independent interpretation of a test performed by another physician/other qualified health care professional (not separately reported)  Category 3: Discuss management/tests  Discussion of management or test interpretation with external physician/other  qualified health care professional/appropriate source (not separately reported) Moderate risk of morbidity from additional diagnostic testing or treatment Examples only:  Prescription drug management  Decision regarding minor surgery with identfied patient or procedure risk factors  Decision regarding elective major surgery without identified patient or procedure risk factors  Diagnosis or treatment significantly limited by social determinants of health             HIGh Complexity  OFFICE   2021 E/M guidelines, first released in 2021, with minor revisions added in 2023. Must meet the requirements for 2 out of 3 dimensions to qualify.    Number and complexity of problems addressed Amount and/or complexity of data reviewed Risk of complications and/or morbidity  Severe exacerbation of chronic illness  Acute or chronic illnesses that may pose a threat to life or bodily function, e.g., multiple trauma, acute MI, pulmonary embolus, severe respiratory distress, progressive rheumatoid arthritis, psychiatric illness with potential threat to self or others, peritonitis, acute renal failure, abrupt change in neurological status  Must meet the requirements for 2 of 3 of the categories)  Category 1: Tests and documents, historian  Any combination of 3 of the following:  Assessment requiring an independent historian  Review of prior external note(s) from each unique source  Review of results of each unique test  Ordering of each unique test    Category 2: Interpretation of tests    Independent interpretation of a test performed by another physician/other qualified health care professional (not separately reported)  Category 3: Discuss management/tests  Discussion of management or test interpretation with external physician/other qualified health care professional/appropriate source (not separately reported)  HIGH risk of morbidity from additional diagnostic testing or treatment Examples only:  Drug therapy requiring intensive monitoring for toxicity  Decision for elective major surgery with identified pateint or procedure risk factors  Decision regarding hospitalization or escalation of level of care  Decision for DNR or to de-escalate care   Parenteral controlled  substances            LEGEND - Independent interpretation involves the interpretation of a test for which there is a CPT code, and an interpretation or report is customary. When a review and interpretation of a test is performed and documented by the provider, but not separately reported (billed), then this would represent an independent interpretation. This report does not need to conform to the usual standards of a complete report of the test. This does not include interpretation of tests that do not have formal reports such as a complete blood count with differential and blood cultures. Examples would include reviewing a chest radiograph and documenting in the medical record an interpretation, but not separately reporting (billing) the interpretation of the chest radiograph.   An appropriate source includes professionals who  are not health care professionals but may be involved in the management of the patient, such as a Clinical research associate, upper officer, case manager or teacher, and does not include discussion with family or informal caregivers.    - SDOH: SDOH are the conditions in the environments where people are born, live, learn, work, play, worship, and age that affect a wide range of health, functioning, and quality-of-life outcomes and risks. (e.g., housing, food insecurity, transportation, etc.). SDOH-related Z codes ranging from Z55-Z65 are the ICD-10-CM diagnosis codes used to document SDOH data Z55 - Problems related to education and literacy Z56 - Problems related to employment and unemployment Z57 - Occupational exposure to risk factors Z58 - Problems related to  physical environment Z59 - Problems related to housing and economic circumstances [phone] - Problems related to social environment [phone] - Problems related to upbringing [phone] - Other problems related to primary support group, including family circumstances Z27 - Problems related to certain psychosocial circumstances Z65 - Problems related to other psychosocial circumstances

## 2023-07-28 ENCOUNTER — Encounter (INDEPENDENT_AMBULATORY_CARE_PROVIDER_SITE_OTHER): Payer: Self-pay | Admitting: Primary Care

## 2023-07-29 ENCOUNTER — Telehealth: Admitting: Physician Assistant

## 2023-07-29 DIAGNOSIS — H9201 Otalgia, right ear: Secondary | ICD-10-CM

## 2023-07-29 MED ORDER — AMOXICILLIN 875 MG PO TABS
875.0000 mg | ORAL_TABLET | Freq: Two times a day (BID) | ORAL | 0 refills | Status: AC
  Filled 2023-07-29: qty 20, 10d supply, fill #0

## 2023-07-29 NOTE — Progress Notes (Signed)
 E-Visit for Ear Pain - Acute Otitis Media   We are sorry that you are not feeling well. Here is how we plan to help!  Based on what you have shared with me it looks like you have Acute Otitis Media.  Acute Otitis Media is an infection of the middle or "inner" ear. This type of infection can cause redness, inflammation, and fluid buildup behind the tympanic membrane (ear drum).  The usual symptoms include: Earache/Pain Fever Upper respiratory symptoms Lack of energy/Fatigue/Malaise Slight hearing loss gradually worsening- if the inner ear fills with fluid What causes middle ear infections? Most middle ear infections occur when an infection such as a cold, leads to a build-up of mucus in the middle ear and causes the Eustachian tube (a thin tube that runs from the middle ear to the back of the nose) to become swollen or blocked.   This means mucus can't drain away properly, making it easier for an infection to spread into the middle ear.  How middle ear infections are treated: Most ear infections clear up within three to five days and don't need any specific treatment. If necessary, tylenol or ibuprofen should be used to relieve pain and a high temperature.  If you develop a fever higher than 102, or any significantly worsening symptoms, this could indicate a more serious infection moving to the middle/inner and needs face to face evaluation in an office by a provider.   Antibiotics aren't routinely used to treat middle ear infections, although they may occasionally be prescribed if symptoms persist or are particularly severe. Given your presentation,   I have prescribed Amoxicillin 875 mg one tablet twice daily for 10 days   Your symptoms should improve over the next 3 days and should resolve in about 7 days. Be sure to complete ALL of the prescription(s) given.  HOME CARE: Wash your hands frequently. If you are prescribed an ear drop, do not place the tip of the bottle on your ear or  touch it with your fingers. You can take Acetaminophen 650 mg every 4-6 hours as needed for pain.  If pain is severe or moderate, you can apply a heating pad (set on low) or hot water bottle (wrapped in a towel) to outer ear for 20 minutes.  This will also increase drainage.  GET HELP RIGHT AWAY IF: Fever is over 102.2 degrees. You develop progressive ear pain or hearing loss. Ear symptoms persist longer than 3 days after treatment.  MAKE SURE YOU: Understand these instructions. Will watch your condition. Will get help right away if you are not doing well or get worse.  Thank you for choosing an e-visit.  Your e-visit answers were reviewed by a board certified advanced clinical practitioner to complete your personal care plan. Depending upon the condition, your plan could have included both over the counter or prescription medications.  Please review your pharmacy choice. Make sure the pharmacy is open so you can pick up the prescription now. If there is a problem, you may contact your provider through [employer] and have the prescription routed to another pharmacy.  Your safety is important to [place]. If you have drug allergies check your prescription carefully.   For the next 24 hours you can use MyChart to ask questions about today's visit, request a non-urgent call back, or ask for a work or school excuse. You will get an email with a survey after your eVisit asking about your experience. We would appreciate your feedback. I hope  that your e-visit has been valuable and will aid in your recovery.

## 2023-07-29 NOTE — Progress Notes (Signed)
 I have spent 5 minutes in review of e-visit questionnaire, review and updating patient chart, medical decision making and response to patient.   Piedad Climes, PA-C

## 2023-07-30 ENCOUNTER — Ambulatory Visit: Admitting: Sports Medicine

## 2023-07-30 ENCOUNTER — Other Ambulatory Visit (INDEPENDENT_AMBULATORY_CARE_PROVIDER_SITE_OTHER): Payer: Self-pay | Admitting: Primary Care

## 2023-07-30 ENCOUNTER — Encounter (HOSPITAL_COMMUNITY): Payer: Self-pay

## 2023-07-30 ENCOUNTER — Other Ambulatory Visit (HOSPITAL_COMMUNITY): Payer: Self-pay

## 2023-07-30 ENCOUNTER — Other Ambulatory Visit: Payer: Self-pay

## 2023-07-30 DIAGNOSIS — F331 Major depressive disorder, recurrent, moderate: Secondary | ICD-10-CM

## 2023-07-30 MED ORDER — CARIPRAZINE HCL 1.5 MG PO CAPS
1.5000 mg | ORAL_CAPSULE | Freq: Every day | ORAL | 1 refills | Status: AC
Start: 2023-07-30 — End: ?
  Filled 2023-07-30: qty 90, 90d supply, fill #0
  Filled 2023-07-31: qty 30, 30d supply, fill #0
  Filled 2023-08-27: qty 30, 30d supply, fill #1
  Filled 2023-09-24: qty 30, 30d supply, fill #2
  Filled 2023-10-23: qty 30, 30d supply, fill #3
  Filled 2023-11-16 – 2023-11-19 (×2): qty 30, 30d supply, fill #4
  Filled 2023-12-09 – 2023-12-18 (×4): qty 30, 30d supply, fill #5

## 2023-07-30 NOTE — Telephone Encounter (Signed)
 Will forward to provider

## 2023-07-31 ENCOUNTER — Other Ambulatory Visit: Payer: Self-pay

## 2023-07-31 ENCOUNTER — Encounter (HOSPITAL_COMMUNITY): Payer: Self-pay

## 2023-07-31 ENCOUNTER — Ambulatory Visit (HOSPITAL_COMMUNITY): Admitting: Licensed Clinical Social Worker

## 2023-07-31 ENCOUNTER — Other Ambulatory Visit (HOSPITAL_COMMUNITY): Payer: Self-pay

## 2023-08-03 ENCOUNTER — Other Ambulatory Visit (HOSPITAL_COMMUNITY): Payer: Self-pay

## 2023-08-14 ENCOUNTER — Other Ambulatory Visit: Payer: Self-pay

## 2023-08-14 ENCOUNTER — Encounter (INDEPENDENT_AMBULATORY_CARE_PROVIDER_SITE_OTHER): Payer: Self-pay | Admitting: Primary Care

## 2023-08-14 ENCOUNTER — Ambulatory Visit (INDEPENDENT_AMBULATORY_CARE_PROVIDER_SITE_OTHER): Admitting: Primary Care

## 2023-08-14 VITALS — BP 136/89 | HR 71 | Resp 16 | Wt 205.8 lb

## 2023-08-14 DIAGNOSIS — Z1211 Encounter for screening for malignant neoplasm of colon: Secondary | ICD-10-CM

## 2023-08-14 DIAGNOSIS — Z1231 Encounter for screening mammogram for malignant neoplasm of breast: Secondary | ICD-10-CM | POA: Diagnosis not present

## 2023-08-14 NOTE — Progress Notes (Signed)
 Medication Samples have been provided to the patient.  Drug name: Tzhncb       Strength: 0.25        Qty: 1 Box   LOT: [id number]  Exp.Date: 06/05/2024  Dosing instructions: Inject 0.25mg  into the skin once a week   The patient has been instructed regarding the correct time, dose, and frequency of taking this medication, including desired effects and most common side effects.   Catherine Kane 5:11 PM 08/14/2023

## 2023-08-14 NOTE — Progress Notes (Deleted)
 [facility]  WELL-WOMAN PHYSICAL & PAP Patient name: Catherine Kane MRN [id number]  Date of birth: 09/11/1970 Chief Complaint:   Blood Pressure Check, Weight Management Screening, and Back Pain  History of Present Illness:   Catherine Kane is a 53 y.o. [id number] female being seen today for a routine well-woman exam.   CC:gyn   The current method of family planning is none.  No LMP recorded. Last pap 2022. Results were: normal Last mammogram: 12/12/22. Results were: normal. Family h/o breast cancer: Yes Last colonoscopy: 12/11/2017. Results were: normal. Family h/o colorectal cancer: No  Health Maintenance  Topic Date Due   Hepatitis B Vaccine (1 of 3 - 19+ 3-dose series) Never done   Colon Cancer Screening  Never done   COVID-19 Vaccine (1 - 2024-25 season) Never done   Mammogram  12/12/2022   Zoster (Shingles) Vaccine (2 of 2) 08/08/2023   Flu Shot  09/07/2023   Pap with HPV screening  11/26/2023   DTaP/Tdap/Td vaccine (2 - Td or Tdap) 07/06/2024   Pneumococcal Vaccination  Completed   Hepatitis C Screening  Completed   HIV Screening  Completed   HPV Vaccine  Aged Out   Meningitis B Vaccine  Aged Out   Review of Systems:    Denies any headaches, blurred vision, fatigue, shortness of breath, chest pain, abdominal pain, abnormal vaginal discharge/itching/odor/irritation, problems with periods, bowel movements, urination, or intercourse unless otherwise stated above.  Pertinent History Reviewed:   Reviewed past medical,surgical, social and family history.  Reviewed problem list, medications and allergies.  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   She has a past  surgical history that includes Tonsillectomy and Tubal ligation.   Her family history includes Breast cancer in her maternal aunt and maternal grandmother; Heart disease in her mother and sister.She reports that she has never smoked. She has never used smokeless tobacco. She reports current alcohol use of about 1.0 standard drink of alcohol per week. She reports that she does not use drugs.  Current Outpatient Medications on File Prior to Visit  Medication Sig Dispense Refill   albuterol  (PROVENTIL ) (2.5 MG/3ML) 0.083% nebulizer solution Take 3 mLs (2.5 mg total) by nebulization every 6 (six) hours as needed for wheezing or shortness of breath. (Patient not taking: Reported on 04/12/2023) 180 mL 1   albuterol  (VENTOLIN  HFA) 108 (90 Base) MCG/ACT inhaler Inhale 1-2 puffs into the lungs every 6 (six) hours as needed. 18 g 2   amLODipine  (NORVASC ) 10 MG tablet Take 1 tablet (10 mg total) by mouth daily. 90 tablet 1   apixaban  (ELIQUIS ) 5 MG TABS tablet Take 1 tablet (5 mg total) by mouth 2 (two) times daily. 180 tablet 1   atorvastatin  (LIPITOR) 40 MG tablet Take 1 tablet (40 mg total) by mouth daily. 90 tablet 1   cariprazine  (VRAYLAR ) 1.5 MG capsule Take 1 capsule (1.5 mg total) by mouth daily. 90 capsule 1   carvedilol  (COREG ) 12.5 MG tablet Take 1 tablet (12.5 mg total) by mouth 2 (two) times daily with a meal. 180 tablet 1   cloNIDine  (CATAPRES ) 0.2 MG tablet Take 1 tablet (0.2 mg total) by mouth daily. 90 tablet 1   diclofenac  Sodium (VOLTAREN ) 1 % GEL Apply 2 grams topically 4 (four) times daily to painful joints 100  g 1   hydrochlorothiazide  (HYDRODIURIL ) 25 MG tablet Take 1 tablet (25 mg total) by mouth daily. 90 tablet 1   levETIRAcetam  (KEPPRA ) 500 MG tablet Take 1 tablet (500 mg total) by mouth 2 (two) times daily for 2 days. (Patient not taking: Reported on 04/12/2023) 4 tablet 0   loratadine  (CLARITIN ) 10 MG tablet Take 1 tablet (10 mg total) by mouth daily. 90 tablet 1   montelukast   (SINGULAIR ) 10 MG tablet Take 1 tablet (10 mg total) by mouth at bedtime. 90 tablet 1   pantoprazole  (PROTONIX ) 40 MG tablet Take 1 tablet (40 mg total) by mouth daily. Take 30-60 minutes before the first meal of the day. 90 tablet 1   tiotropium (SPIRIVA  HANDIHALER) 18 MCG inhalation capsule Place 1 capsule (18 mcg total) into inhaler and inhale daily. 30 capsule 12   No current facility-administered medications on file prior to visit.    Physical Assessment:   Vitals:   08/14/23 1617 08/14/23 1619  BP: (!) 141/99 136/89  Pulse: 71   Resp: 16   SpO2: 96%   Weight: 205 lb 12.8 oz (93.4 kg)   Body mass index is 36.46 kg/m.        Physical Examination:  General appearance - well appearing, and in no distress Mental status - alert, oriented to person, place, and time Psych:  She has a normal mood and affect Skin - warm and dry, normal color, no suspicious lesions noted Chest - effort normal, all lung fields clear to auscultation bilaterally Heart - normal rate and regular rhythm Neck:  midline trachea, no thyromegaly or nodules Breasts - breasts appear normal, no suspicious masses, no skin or nipple changes or axillary nodes Educated patient on proper self breast examination and had patient to demonstrate SBE. Abdomen - soft, nontender, nondistended, no masses or organomegaly Pelvic-VULVA: normal appearing vulva with no masses, tenderness or lesions   VAGINA: normal appearing vagina with normal color and discharge, no lesions   CERVIX: normal appearing cervix without discharge or lesions, no CMT UTERUS: uterus is felt to be normal size, shape, consistency and nontender  ADNEXA: No adnexal masses or tenderness noted. Extremities:  No swelling or varicosities noted  No results found for this or any previous visit (from the past 24 hours).   Assessment & Plan:  No orders of the defined types were placed in this encounter.   Meds: No orders of the defined types were placed in this  encounter.   Follow-up: No follow-ups on file.  This note has been created with Education officer, environmental. Any transcriptional errors are unintentional.   [name], NP 08/14/2023, 4:36 PM

## 2023-08-20 MED ORDER — KETOROLAC TROMETHAMINE 60 MG/2ML IM SOLN: 60.0000 mg | Freq: Once | INTRAMUSCULAR | Status: AC

## 2023-08-21 ENCOUNTER — Ambulatory Visit: Admitting: Orthopaedic Surgery

## 2023-08-23 ENCOUNTER — Ambulatory Visit: Payer: Self-pay

## 2023-08-23 NOTE — Telephone Encounter (Signed)
 FYI Only or Action Required?: Action required by provider: clinical question for provider.  Patient was last seen in primary care on 08/14/2023 by Celestia Rosaline SQUIBB, NP.  Called Nurse Triage reporting Back Pain.  Symptoms began several weeks ago.  Interventions attempted: OTC medications: aleve , tylenol .  Symptoms are: gradually worsening.  Triage Disposition: See HCP Within 4 Hours (Or PCP Triage)  Patient/caregiver understands and will follow disposition?: UnsureCopied from CRM [id number]. Topic: General - Other >> Aug 21, 2023  1:45 PM Essie A wrote: Reason for CRM: Patient had an orthopedics appointment for 2 pm today, but they didn't accept her because she didn't have her copay.  She had to reschedule. She would like to have someone call her because she is in pain.  Please call her at [phone].  Thanks. >> Aug 23, 2023 10:35 AM Montie POUR wrote: Her pain is at an 8 from her middle back all the way down to lower back. It's hard to stand for a long time.  >> Aug 21, 2023  2:52 PM CMA Stefanie K wrote: Again not our patient. Wrong office/provider. Send to correct office.  >> Aug 21, 2023  2:13 PM CMA Stefanie K wrote: Not our patient. Send to correct provider/office.  Reason for Disposition  [1] SEVERE back pain (e.g., excruciating, unable to do any normal activities) AND [2] not improved 2 hours after pain medicine  Answer Assessment - Initial Assessment Questions 1. ONSET: When did the pain begin? (e.g., minutes, hours, days)     Not sure 2. LOCATION: Where does it hurt? (upper, mid or lower back)     Lower back  3. SEVERITY: How bad is the pain?  (e.g., Scale 1-10; mild, moderate, or severe)     8 4. PATTERN: Is the pain constant? (e.g., yes, no; constant, intermittent)      Constant  5. RADIATION: Does the pain shoot into your legs or somewhere else?     Radiates into leg 6. CAUSE:  What do you think is causing the back pain?      Sciatica  7. BACK OVERUSE:   Any recent lifting of heavy objects, strenuous work or exercise?     Na  8. MEDICINES: What have you taken so far for the pain? (e.g., nothing, acetaminophen , NSAIDS)     Aleve  and tylenol  9. NEUROLOGIC SYMPTOMS: Do you have any weakness, numbness, or problems with bowel/bladder control?     denies 10. OTHER SYMPTOMS: Do you have any other symptoms? (e.g., fever, abdomen pain, burning with urination, blood in urine)       denies    Pt diagnosed with sciatic. Pt had appt with ortho and had to cancel due to copay fee. Pt now has appt next week. Pt has lower back pain and is asking for pain medication until pt can be seen next week. Toradol  shot helped for a few hour that was given last week. Pt is having hard time. Can't sleep. Pt has tried heating pad, aleve , tylenol  back. No relief. Pain keeps pt from standing up for longer than 5 mins. Pt unable to make appt that was offered due to transportation. If medication can be called in,  pt would be grateful. Please advise.  Protocols used: Back Pain-A-AH

## 2023-08-24 ENCOUNTER — Other Ambulatory Visit: Payer: Self-pay | Admitting: Primary Care

## 2023-08-24 DIAGNOSIS — M5431 Sciatica, right side: Secondary | ICD-10-CM

## 2023-08-24 DIAGNOSIS — G8929 Other chronic pain: Secondary | ICD-10-CM

## 2023-08-24 NOTE — Telephone Encounter (Signed)
 Will forward to provider

## 2023-08-27 ENCOUNTER — Other Ambulatory Visit (HOSPITAL_COMMUNITY): Payer: Self-pay

## 2023-08-28 ENCOUNTER — Other Ambulatory Visit (HOSPITAL_COMMUNITY): Payer: Self-pay

## 2023-08-30 ENCOUNTER — Ambulatory Visit: Admitting: Physician Assistant

## 2023-09-05 ENCOUNTER — Encounter (INDEPENDENT_AMBULATORY_CARE_PROVIDER_SITE_OTHER): Payer: Self-pay | Admitting: Primary Care

## 2023-09-05 ENCOUNTER — Other Ambulatory Visit (HOSPITAL_COMMUNITY): Payer: Self-pay

## 2023-09-05 ENCOUNTER — Telehealth (INDEPENDENT_AMBULATORY_CARE_PROVIDER_SITE_OTHER): Payer: Self-pay

## 2023-09-05 ENCOUNTER — Other Ambulatory Visit: Payer: Self-pay

## 2023-09-05 ENCOUNTER — Ambulatory Visit (INDEPENDENT_AMBULATORY_CARE_PROVIDER_SITE_OTHER): Admitting: Primary Care

## 2023-09-05 VITALS — BP 95/68 | HR 63 | Resp 16 | Wt 200.8 lb

## 2023-09-05 DIAGNOSIS — Z6835 Body mass index (BMI) 35.0-35.9, adult: Secondary | ICD-10-CM | POA: Diagnosis not present

## 2023-09-05 DIAGNOSIS — Z7689 Persons encountering health services in other specified circumstances: Secondary | ICD-10-CM

## 2023-09-05 NOTE — Progress Notes (Signed)
 [facility]   Catherine Kane, is a 53 y.o. female presents for a follow up after starting on  for weight loss for [facility] .  She has been taking it for 1 months. Patient states they have changed diet eating out less, baking or AIR frying foods.  Increasing exercise. W also hile on the [facility] they have lost 5 lbs since last visit. They deny palpitations, anxiety, trouble sleeping, elevated BP.  Questioning patient why her blood pressure was so low 95/68 she explained that the disability doctor told her to take all her medications at the same time.  Prior to she was taking clonidine  at bedtime which side effect is drowsiness and sleepy.  Which would explain the change in her blood pressure.  Advised patient to return to the way she was taking her blood pressure prior to.  BP Readings from Last 3 Encounters:  09/05/23 95/68  08/14/23 136/89  06/13/23 (!) 144/106    Wt Readings from Last 3 Encounters:  09/05/23 200 lb 12.8 oz (91.1 kg)  08/14/23 205 lb 12.8 oz (93.4 kg)  06/13/23 207 lb 9.6 oz (94.2 kg)   Medications: Current Outpatient Medications on File Prior to Visit  Medication Sig Dispense Refill   albuterol  (PROVENTIL ) (2.5 MG/3ML) 0.083% nebulizer solution Take 3 mLs (2.5 mg total) by nebulization every 6 (six) hours as needed for wheezing or shortness of breath. (Patient not taking: Reported on 04/12/2023) 180 mL 1   albuterol  (VENTOLIN  HFA) 108 (90 Base) MCG/ACT inhaler Inhale 1-2 puffs into the lungs every 6 (six) hours as needed. 18 g 2   amLODipine  (NORVASC ) 10 MG tablet Take 1 tablet (10 mg total) by mouth daily. 90 tablet 1   apixaban  (ELIQUIS ) 5 MG TABS tablet Take 1 tablet (5 mg total) by mouth 2 (two) times daily. 180 tablet 1   atorvastatin  (LIPITOR) 40 MG tablet Take 1 tablet (40 mg total) by mouth daily. 90 tablet 1   cariprazine  (VRAYLAR ) 1.5 MG capsule Take 1 capsule (1.5 mg total) by mouth daily. 90 capsule 1   carvedilol  (COREG ) 12.5 MG tablet  Take 1 tablet (12.5 mg total) by mouth 2 (two) times daily with a meal. 180 tablet 1   cloNIDine  (CATAPRES ) 0.2 MG tablet Take 1 tablet (0.2 mg total) by mouth daily. 90 tablet 1   diclofenac  Sodium (VOLTAREN ) 1 % GEL Apply 2 grams topically 4 (four) times daily to painful joints 100 g 1   hydrochlorothiazide  (HYDRODIURIL ) 25 MG tablet Take 1 tablet (25 mg total) by mouth daily. 90 tablet 1   levETIRAcetam  (KEPPRA ) 500 MG tablet Take 1 tablet (500 mg total) by mouth 2 (two) times daily for 2 days. (Patient not taking: Reported on 04/12/2023) 4 tablet 0   loratadine  (CLARITIN ) 10 MG tablet Take 1 tablet (10 mg total) by mouth daily. 90 tablet 1   montelukast  (SINGULAIR ) 10 MG tablet Take 1 tablet (10 mg total) by mouth at bedtime. 90 tablet 1   pantoprazole  (PROTONIX ) 40 MG tablet Take 1 tablet (40 mg total) by mouth daily. Take 30-60 minutes before the first meal of the day. 90 tablet 1   tiotropium (SPIRIVA  HANDIHALER) 18 MCG inhalation capsule Place 1 capsule (18 mcg total) into inhaler and inhale daily. 30 capsule 12   No current facility-administered medications on file prior to visit.    ROS:   Denies any headaches, blurred vision, fatigue, shortness of breath, chest pain, abdominal  pain, abnormal vaginal discharge/itching/odor/irritation, problems with periods, bowel movements, urination, or intercourse unless otherwise stated above.  Physical exam:  Vitals:   09/05/23 1621 09/05/23 1623  BP: 93/66 95/68  Pulse: 63   Resp: 16   SpO2: 100%    General: No apparent distress. Eyes: Extraocular eye movements intact, pupils equal and round. Neck: Supple, trachea midline. Thyroid : No enlargement, mobile without fixation, no tenderness. Cardiovascular: Regular rhythm and rate, no murmur, normal radial pulses. Respiratory: Normal respiratory effort, clear to auscultation. Gastrointestinal: Normal pitch active bowel sounds, nontender abdomen without distention or appreciable  hepatomegaly. Musculoskeletal: right side weakness . Skin: Appropriate warmth, no visible rash. Mental status: Alert, conversant, speech clear, thought logical, appropriate mood and affect, no hallucinations or delusions evident. Hematologic/lymphatic: No cervical adenopathy, no visible ecchymoses.   Assessment and Plan: Obesity with co morbid conditions.  General weight loss/lifestyle modification strategies discussed (elicit support from others; identify saboteurs; non-food rewards, etc). Informal exercise measures discussed, e.g. taking stairs instead of elevator. Medication: wegovy  Follow up in:  1 month and as needed.   This note has been created with Education officer, environmental. Any transcriptional errors are unintentional.   Catherine SHAUNNA Bohr, NP 09/05/2023, 4:53 PM

## 2023-09-05 NOTE — Telephone Encounter (Signed)
 Prior auth form for wegovy has been filled and faxed to [employer]

## 2023-09-07 ENCOUNTER — Encounter (INDEPENDENT_AMBULATORY_CARE_PROVIDER_SITE_OTHER): Payer: Self-pay | Admitting: Primary Care

## 2023-09-07 ENCOUNTER — Other Ambulatory Visit (HOSPITAL_COMMUNITY): Payer: Self-pay

## 2023-09-07 ENCOUNTER — Other Ambulatory Visit (INDEPENDENT_AMBULATORY_CARE_PROVIDER_SITE_OTHER): Payer: Self-pay | Admitting: Primary Care

## 2023-09-07 MED ORDER — DICLOFENAC SODIUM 1 % EX GEL
2.0000 g | Freq: Four times a day (QID) | CUTANEOUS | 1 refills | Status: AC
  Filled 2023-09-07 – 2023-09-24 (×2): qty 100, 13d supply, fill #0
  Filled 2023-10-06 – 2023-11-16 (×2): qty 100, 13d supply, fill #1

## 2023-09-07 NOTE — Telephone Encounter (Signed)
 Will forward to provider. Rx has not been sent in

## 2023-09-07 NOTE — Telephone Encounter (Signed)
 Requested Prescriptions  Pending Prescriptions Disp Refills   diclofenac  Sodium (VOLTAREN ) 1 % GEL 100 g 1    Sig: Apply 2 grams topically 4 (four) times daily to painful joints     Analgesics:  Topicals Failed - 09/07/2023  2:46 PM      Failed - Manual Review: Labs are only required if the patient has taken medication for more than 8 weeks.      Failed - HCT in normal range and within 360 days    Hematocrit  Date Value Ref Range Status  06/13/2023 48.2 (H) 34.0 - 46.6 % Final         Passed - PLT in normal range and within 360 days    Platelets  Date Value Ref Range Status  06/13/2023 413 150 - 450 x10E3/uL Final         Passed - HGB in normal range and within 360 days    Hemoglobin  Date Value Ref Range Status  06/13/2023 15.2 11.1 - 15.9 g/dL Final         Passed - Cr in normal range and within 360 days    Creatinine  Date Value Ref Range Status  09/16/2013 0.99 0.60 - 1.30 mg/dL Final   Creatinine, Ser  Date Value Ref Range Status  06/13/2023 0.76 0.57 - 1.00 mg/dL Final         Passed - eGFR is 30 or above and within 360 days    EGFR (African American)  Date Value Ref Range Status  09/16/2013 >60  Final   GFR calc Af Amer  Date Value Ref Range Status  09/15/2019 88 >59 mL/min/1.73 Final    Comment:    **[facility] currently reports eGFR in compliance with the current**   recommendations of the [employer]. [facility] will   update reporting as new guidelines are published from the NKF-ASN   Task force.    EGFR (Non-African Amer.)  Date Value Ref Range Status  09/16/2013 >60  Final    Comment:    eGFR values <68mL/min/1.73 m2 may be an indication of chronic kidney disease (CKD). Calculated eGFR is useful in patients with stable renal function. The eGFR calculation will not be reliable in acutely ill patients when serum creatinine is changing rapidly. It is not useful in  patients on dialysis. The eGFR calculation may not be applicable to  patients at the low and high extremes of body sizes, pregnant women, and vegetarians.    GFR, Estimated  Date Value Ref Range Status  04/19/2022 >60 >60 mL/min Final    Comment:    (NOTE) Calculated using the CKD-EPI Creatinine Equation (2021)    eGFR  Date Value Ref Range Status  06/13/2023 95 >59 mL/min/1.73 Final         Passed - Patient is not pregnant      Passed - Valid encounter within last 12 months    Recent Outpatient Visits           2 days ago Encounter for weight management   [place] [facility] Celestia Rosaline SQUIBB, NP   3 weeks ago Breast cancer screening by mammogram   [place] [facility] Celestia Rosaline SQUIBB, NP   2 months ago Essential hypertension   [place] [facility] Celestia Rosaline SQUIBB, NP   4 months ago Essential hypertension   [place] [facility] Celestia Rosaline SQUIBB, NP   7 months ago Encounter for immunization   [employer]  [facility] Celestia Rosaline SQUIBB, NP

## 2023-09-10 ENCOUNTER — Other Ambulatory Visit: Payer: Self-pay

## 2023-09-10 ENCOUNTER — Other Ambulatory Visit (HOSPITAL_COMMUNITY): Payer: Self-pay

## 2023-09-10 ENCOUNTER — Telehealth (INDEPENDENT_AMBULATORY_CARE_PROVIDER_SITE_OTHER): Payer: Self-pay

## 2023-09-10 MED ORDER — WEGOVY 0.25 MG/0.5ML ~~LOC~~ SOAJ
0.2500 mg | SUBCUTANEOUS | 1 refills | Status: DC
  Filled 2023-09-10: qty 2, 28d supply, fill #0
  Filled 2023-10-03 – 2023-10-06 (×2): qty 2, 28d supply, fill #1

## 2023-09-10 NOTE — Telephone Encounter (Signed)
 Copied from CRM [id number]. Topic: Clinical - Medication Question >> Sep 10, 2023 10:41 AM Tobias CROME wrote: Reason for CRM: Patient following up on request for wegovy . Patient is completely out and sent mychart message on 09/07/23.   Please assist patient further.

## 2023-09-10 NOTE — Telephone Encounter (Signed)
 We provided pt with a sample of wegovy  on 08/14/23 and she came back for her 4 week f/u so that provider can send in rx on 09/05/23. The provider has not sent in rx and pt will be due for injection tomorrow 09/11/23

## 2023-09-11 ENCOUNTER — Other Ambulatory Visit: Payer: Self-pay

## 2023-09-11 ENCOUNTER — Ambulatory Visit: Admitting: Physician Assistant

## 2023-09-12 ENCOUNTER — Telehealth: Payer: Self-pay

## 2023-09-12 ENCOUNTER — Other Ambulatory Visit (HOSPITAL_COMMUNITY): Payer: Self-pay

## 2023-09-12 ENCOUNTER — Other Ambulatory Visit: Payer: Self-pay

## 2023-09-12 NOTE — Telephone Encounter (Signed)
 Pharmacy Patient Advocate Encounter   Received notification from CoverMyMeds that prior authorization for WEGOVY  is required/requested.   Insurance verification completed.   The patient is insured through [facility] MEDICAID .   Per test claim: PA required; PA submitted to above mentioned insurance via CoverMyMeds Key/confirmation #/EOC [id number] Status is pending

## 2023-09-12 NOTE — Telephone Encounter (Signed)
 Pharmacy Patient Advocate Encounter  Received notification from [facility] MEDICAID that Prior Authorization for WEGOVY  has been APPROVED from 09/12/2023 to 03/14/2024   PA #/Case ID/Reference #: [id number]

## 2023-09-14 ENCOUNTER — Inpatient Hospital Stay: Admission: RE | Admit: 2023-09-14 | Source: Ambulatory Visit

## 2023-09-24 ENCOUNTER — Other Ambulatory Visit (HOSPITAL_COMMUNITY): Payer: Self-pay

## 2023-09-24 ENCOUNTER — Other Ambulatory Visit: Payer: Self-pay

## 2023-10-03 ENCOUNTER — Other Ambulatory Visit (INDEPENDENT_AMBULATORY_CARE_PROVIDER_SITE_OTHER): Payer: Self-pay | Admitting: Primary Care

## 2023-10-03 ENCOUNTER — Other Ambulatory Visit (HOSPITAL_COMMUNITY): Payer: Self-pay

## 2023-10-03 ENCOUNTER — Other Ambulatory Visit: Payer: Self-pay

## 2023-10-03 DIAGNOSIS — J301 Allergic rhinitis due to pollen: Secondary | ICD-10-CM

## 2023-10-03 MED ORDER — LORATADINE 10 MG PO TABS
10.0000 mg | ORAL_TABLET | Freq: Every day | ORAL | 1 refills | Status: AC
  Filled 2023-10-03: qty 30, 30d supply, fill #0
  Filled 2023-11-08: qty 30, 30d supply, fill #1
  Filled 2023-12-09: qty 30, 30d supply, fill #2
  Filled 2024-01-07: qty 30, 30d supply, fill #3
  Filled 2024-02-12: qty 30, 30d supply, fill #4
  Filled 2024-03-10: qty 30, 30d supply, fill #5

## 2023-10-06 ENCOUNTER — Other Ambulatory Visit (HOSPITAL_COMMUNITY): Payer: Self-pay

## 2023-10-07 ENCOUNTER — Other Ambulatory Visit (HOSPITAL_COMMUNITY): Payer: Self-pay

## 2023-10-07 ENCOUNTER — Other Ambulatory Visit: Payer: Self-pay

## 2023-10-07 ENCOUNTER — Other Ambulatory Visit (INDEPENDENT_AMBULATORY_CARE_PROVIDER_SITE_OTHER): Payer: Self-pay | Admitting: Primary Care

## 2023-10-07 DIAGNOSIS — J449 Chronic obstructive pulmonary disease, unspecified: Secondary | ICD-10-CM

## 2023-10-07 MED ORDER — ALBUTEROL SULFATE (2.5 MG/3ML) 0.083% IN NEBU
2.5000 mg | INHALATION_SOLUTION | Freq: Four times a day (QID) | RESPIRATORY_TRACT | 1 refills | Status: DC | PRN
  Filled 2023-10-07: qty 180, 15d supply, fill #0
  Filled 2023-12-17 – 2023-12-18 (×2): qty 180, 15d supply, fill #1

## 2023-10-08 ENCOUNTER — Other Ambulatory Visit (HOSPITAL_COMMUNITY): Payer: Self-pay

## 2023-10-09 ENCOUNTER — Other Ambulatory Visit: Payer: Self-pay

## 2023-10-09 ENCOUNTER — Other Ambulatory Visit (HOSPITAL_COMMUNITY): Payer: Self-pay

## 2023-10-09 ENCOUNTER — Encounter: Payer: Self-pay | Admitting: Pharmacist

## 2023-10-10 ENCOUNTER — Ambulatory Visit (INDEPENDENT_AMBULATORY_CARE_PROVIDER_SITE_OTHER): Admitting: Primary Care

## 2023-10-23 ENCOUNTER — Other Ambulatory Visit (HOSPITAL_COMMUNITY): Payer: Self-pay

## 2023-10-24 ENCOUNTER — Other Ambulatory Visit (HOSPITAL_COMMUNITY): Payer: Self-pay

## 2023-10-30 ENCOUNTER — Ambulatory Visit (INDEPENDENT_AMBULATORY_CARE_PROVIDER_SITE_OTHER): Admitting: Primary Care

## 2023-11-02 ENCOUNTER — Ambulatory Visit

## 2023-11-05 ENCOUNTER — Encounter (HOSPITAL_COMMUNITY): Payer: Self-pay

## 2023-11-06 ENCOUNTER — Ambulatory Visit (HOSPITAL_COMMUNITY): Admitting: Student in an Organized Health Care Education/Training Program

## 2023-11-07 ENCOUNTER — Telehealth (INDEPENDENT_AMBULATORY_CARE_PROVIDER_SITE_OTHER): Payer: Self-pay | Admitting: Primary Care

## 2023-11-07 NOTE — Telephone Encounter (Signed)
 Called pt to reschedule appt. Pt did not answer and LVM for pt to return call so we can schedule appt.

## 2023-11-08 ENCOUNTER — Other Ambulatory Visit: Payer: Self-pay

## 2023-11-08 ENCOUNTER — Other Ambulatory Visit (INDEPENDENT_AMBULATORY_CARE_PROVIDER_SITE_OTHER): Payer: Self-pay | Admitting: Primary Care

## 2023-11-08 ENCOUNTER — Other Ambulatory Visit (HOSPITAL_COMMUNITY): Payer: Self-pay

## 2023-11-09 ENCOUNTER — Other Ambulatory Visit (HOSPITAL_COMMUNITY): Payer: Self-pay

## 2023-11-09 MED ORDER — WEGOVY 0.25 MG/0.5ML ~~LOC~~ SOAJ
0.2500 mg | SUBCUTANEOUS | 1 refills | Status: DC
  Filled 2023-11-09 – 2023-11-28 (×11): qty 2, 28d supply, fill #0
  Filled 2024-01-17: qty 2, 28d supply, fill #1

## 2023-11-09 NOTE — Telephone Encounter (Signed)
 Requested medication (s) are due for refill today: yes   Requested medication (s) are on the active medication list: yes   Last refill:  09/10/23 #2 ml 1 refills  Future visit scheduled: no   Notes to clinic:  protocol fails last labs 04/15/22 do you want to refill Rx?     Requested Prescriptions  Pending Prescriptions Disp Refills   semaglutide -weight management (WEGOVY ) 0.25 MG/0.5ML SOAJ SQ injection 2 mL 1    Sig: Inject 0.25 mg into the skin once a week.     Endocrinology:  Diabetes - GLP-1 Receptor Agonists - semaglutide  Failed - 11/09/2023  2:26 PM      Failed - HBA1C in normal range and within 180 days    HbA1c, POC (prediabetic range)  Date Value Ref Range Status  09/08/2019 5.3 (A) 5.7 - 6.4 % Final   HbA1c, POC (controlled diabetic range)  Date Value Ref Range Status  09/08/2019 5.3 0.0 - 7.0 % Final   HbA1c POC (<> result, manual entry)  Date Value Ref Range Status  09/08/2019 5.3 4.0 - 5.6 % Final   Hgb A1c MFr Bld  Date Value Ref Range Status  04/15/2022 5.4 4.8 - 5.6 % Final    Comment:    (NOTE)         Prediabetes: 5.7 - 6.4         Diabetes: >6.4         Glycemic control for adults with diabetes: <7.0          Passed - Cr in normal range and within 360 days    Creatinine  Date Value Ref Range Status  09/16/2013 0.99 0.60 - 1.30 mg/dL Final   Creatinine, Ser  Date Value Ref Range Status  06/13/2023 0.76 0.57 - 1.00 mg/dL Final         Passed - Valid encounter within last 6 months    Recent Outpatient Visits           2 months ago Encounter for weight management   [place] [facility] Celestia Rosaline SQUIBB, NP   2 months ago Breast cancer screening by mammogram   [place] [facility] Celestia Rosaline SQUIBB, NP   4 months ago Essential hypertension   [place] [facility] Celestia Rosaline SQUIBB, NP   7 months ago Essential hypertension   [place] [facility] Celestia Rosaline SQUIBB, NP   10 months ago Encounter for immunization   [facility] Celestia Rosaline SQUIBB, NP

## 2023-11-12 ENCOUNTER — Other Ambulatory Visit (HOSPITAL_COMMUNITY): Payer: Self-pay

## 2023-11-12 ENCOUNTER — Ambulatory Visit: Admitting: Sports Medicine

## 2023-11-13 ENCOUNTER — Telehealth (INDEPENDENT_AMBULATORY_CARE_PROVIDER_SITE_OTHER): Payer: Self-pay | Admitting: Primary Care

## 2023-11-13 NOTE — Telephone Encounter (Signed)
 Copied from CRM [id number]. Topic: Clinical - Medication Question >> Nov 12, 2023  1:05 PM Catherine Kane wrote: Reason for CRM: pt stated she got a letter in the mail about WEGOVY , [employer] no longer paying for meds. Needed to see if it would still be paid through [facility]. Pharmacy sent in a rx auth and needs an update

## 2023-11-13 NOTE — Telephone Encounter (Addendum)
 Advised patient to call [facility], to ask about medication being covered. This message was left on voicemail.

## 2023-11-15 ENCOUNTER — Other Ambulatory Visit (HOSPITAL_COMMUNITY): Payer: Self-pay

## 2023-11-16 ENCOUNTER — Other Ambulatory Visit (HOSPITAL_BASED_OUTPATIENT_CLINIC_OR_DEPARTMENT_OTHER): Payer: Self-pay

## 2023-11-16 ENCOUNTER — Telehealth (HOSPITAL_COMMUNITY): Payer: Self-pay

## 2023-11-16 ENCOUNTER — Encounter (HOSPITAL_COMMUNITY): Payer: Self-pay

## 2023-11-16 ENCOUNTER — Telehealth (HOSPITAL_COMMUNITY): Payer: Self-pay | Admitting: Pharmacy Technician

## 2023-11-16 ENCOUNTER — Other Ambulatory Visit (HOSPITAL_COMMUNITY): Payer: Self-pay

## 2023-11-16 NOTE — Telephone Encounter (Signed)
 PA request has been Received. New Encounter has been or will be created for follow up. For additional info see Pharmacy Prior Auth telephone encounter from 11/16/23.

## 2023-11-16 NOTE — Telephone Encounter (Signed)
 Pharmacy Patient Advocate Encounter   Received notification from Pt Calls Messages that prior authorization for Wegovy  0.25 mg/0.5 ml auto injectors is required/requested.   Insurance verification completed.   The patient is insured through [facility] MEDICAID.   Per test claim: Effective October 1st, Medicaid will discontinue coverage of GLP1 medications for weight loss (such as Wegovy  and Zepbound), unless the patient has a documented history of a heart attack or stroke. Zepbound will continue to be covered only for patients with moderate to severe sleep apnea (AHI 15-30) and a BMI greater than 40. Because of this change, the prior authorization team will not be submitting new PA requests for GLP1 medications prescribed for weight loss, as patients will be unable to continue therapy under Medicaid coverage.

## 2023-11-18 ENCOUNTER — Other Ambulatory Visit (HOSPITAL_COMMUNITY): Payer: Self-pay

## 2023-11-18 ENCOUNTER — Encounter (INDEPENDENT_AMBULATORY_CARE_PROVIDER_SITE_OTHER): Payer: Self-pay | Admitting: Primary Care

## 2023-11-19 ENCOUNTER — Telehealth (HOSPITAL_COMMUNITY): Payer: Self-pay

## 2023-11-19 ENCOUNTER — Other Ambulatory Visit (HOSPITAL_COMMUNITY): Payer: Self-pay

## 2023-11-19 ENCOUNTER — Other Ambulatory Visit: Payer: Self-pay

## 2023-11-19 ENCOUNTER — Ambulatory Visit (INDEPENDENT_AMBULATORY_CARE_PROVIDER_SITE_OTHER): Admitting: Primary Care

## 2023-11-19 NOTE — Telephone Encounter (Signed)
 Pharmacy Patient Advocate Encounter   Received notification from Pt Calls Messages that prior authorization for Wegovy  0.25MG /0.5ML auto-injectors  is required/requested.   Insurance verification completed.   The patient is insured through [facility] MEDICAID.   Per test claim: PA required; PA submitted to above mentioned insurance via Latent Key/confirmation #/EOC [id number] Status is pending

## 2023-11-19 NOTE — Telephone Encounter (Signed)
 PA request has been Submitted. New Encounter has been or will be created for follow up. For additional info see Pharmacy Prior Auth telephone encounter from 11/19/23. I apologize that I missed that she has had a stroke with qualifies her for us  to submit a prior auth. I have submitted it today and hopefully we will get a determination in the next few days.

## 2023-11-19 NOTE — Telephone Encounter (Signed)
Please respond to pt message. 

## 2023-11-19 NOTE — Telephone Encounter (Signed)
 The patient called back and said she spoke to [facility] and even though they said they may not cover the Wegovy  they will under certain circumstances as long as they get information including a prior authorization as to why the patient is being prescribed this medication. Please assist patient further.

## 2023-11-19 NOTE — Telephone Encounter (Signed)
Would you be able to help me with this?

## 2023-11-19 NOTE — Telephone Encounter (Signed)
 Please refer to message sent to you on 11/16/23 from pharmacy in regards to medication

## 2023-11-20 ENCOUNTER — Other Ambulatory Visit (HOSPITAL_COMMUNITY): Payer: Self-pay

## 2023-11-20 ENCOUNTER — Other Ambulatory Visit: Payer: Self-pay

## 2023-11-20 NOTE — Telephone Encounter (Signed)
 Pharmacy Patient Advocate Encounter  Received notification from [facility] MEDICAID that Prior Authorization for Wegovy  0.25MG /0.5ML auto-injectors  has been DENIED.  See denial reason below. No denial letter attached in CMM. Will attach denial letter to Media tab once received.   PA #/Case ID/Reference #: [id number]      *new guidelines starting November 07, 2023, the patient has to have a BMI of 40 and above

## 2023-11-22 ENCOUNTER — Other Ambulatory Visit (HOSPITAL_COMMUNITY): Payer: Self-pay

## 2023-11-23 ENCOUNTER — Ambulatory Visit (HOSPITAL_COMMUNITY): Admitting: Psychiatry

## 2023-11-23 ENCOUNTER — Encounter (HOSPITAL_COMMUNITY): Payer: Self-pay

## 2023-11-26 ENCOUNTER — Encounter (INDEPENDENT_AMBULATORY_CARE_PROVIDER_SITE_OTHER): Payer: Self-pay | Admitting: Primary Care

## 2023-11-26 ENCOUNTER — Other Ambulatory Visit: Payer: Self-pay

## 2023-11-26 ENCOUNTER — Other Ambulatory Visit (HOSPITAL_COMMUNITY): Payer: Self-pay

## 2023-11-27 NOTE — Telephone Encounter (Signed)
 Will forward to provider

## 2023-11-28 ENCOUNTER — Other Ambulatory Visit: Payer: Self-pay

## 2023-11-28 ENCOUNTER — Telehealth: Payer: Self-pay

## 2023-11-28 ENCOUNTER — Other Ambulatory Visit (HOSPITAL_COMMUNITY): Payer: Self-pay

## 2023-11-28 ENCOUNTER — Other Ambulatory Visit: Payer: Self-pay | Admitting: Primary Care

## 2023-11-28 DIAGNOSIS — Z1231 Encounter for screening mammogram for malignant neoplasm of breast: Secondary | ICD-10-CM

## 2023-11-28 NOTE — Telephone Encounter (Signed)
 Kelly please help. Pt states she has had a previous stroke

## 2023-11-28 NOTE — Telephone Encounter (Signed)
 Pharmacy Patient Advocate Encounter   Received notification from Pt Calls Messages that prior authorization for WEGOVY  is required/requested.   Insurance verification completed.   The patient is insured through [facility] MEDICAID.   Per test claim: PA required; PA submitted to above mentioned insurance via CoverMyMeds Key/confirmation #/EOC [id number] Status is pending

## 2023-12-04 ENCOUNTER — Ambulatory Visit: Admitting: Sports Medicine

## 2023-12-06 ENCOUNTER — Other Ambulatory Visit: Payer: Self-pay

## 2023-12-09 ENCOUNTER — Other Ambulatory Visit (INDEPENDENT_AMBULATORY_CARE_PROVIDER_SITE_OTHER): Payer: Self-pay | Admitting: Primary Care

## 2023-12-09 DIAGNOSIS — I10 Essential (primary) hypertension: Secondary | ICD-10-CM

## 2023-12-10 ENCOUNTER — Other Ambulatory Visit (HOSPITAL_COMMUNITY): Payer: Self-pay

## 2023-12-10 ENCOUNTER — Encounter: Payer: Self-pay | Admitting: Radiology

## 2023-12-10 MED ORDER — HYDROCHLOROTHIAZIDE 25 MG PO TABS
25.0000 mg | ORAL_TABLET | Freq: Every day | ORAL | 1 refills | Status: AC
  Filled 2023-12-10: qty 90, 90d supply, fill #0
  Filled 2024-03-06: qty 90, 90d supply, fill #1

## 2023-12-10 MED ORDER — CLONIDINE HCL 0.2 MG PO TABS
0.2000 mg | ORAL_TABLET | Freq: Every day | ORAL | 1 refills | Status: AC
  Filled 2023-12-10: qty 90, 90d supply, fill #0
  Filled 2024-03-06: qty 90, 90d supply, fill #1

## 2023-12-11 ENCOUNTER — Other Ambulatory Visit: Payer: Self-pay

## 2023-12-13 ENCOUNTER — Encounter (INDEPENDENT_AMBULATORY_CARE_PROVIDER_SITE_OTHER): Payer: Self-pay | Admitting: Primary Care

## 2023-12-17 ENCOUNTER — Ambulatory Visit

## 2023-12-17 ENCOUNTER — Other Ambulatory Visit (HOSPITAL_COMMUNITY): Payer: Self-pay

## 2023-12-17 ENCOUNTER — Other Ambulatory Visit: Payer: Self-pay

## 2023-12-17 DIAGNOSIS — Z1231 Encounter for screening mammogram for malignant neoplasm of breast: Secondary | ICD-10-CM

## 2023-12-18 ENCOUNTER — Telehealth: Admitting: Family Medicine

## 2023-12-18 ENCOUNTER — Other Ambulatory Visit: Payer: Self-pay

## 2023-12-18 ENCOUNTER — Other Ambulatory Visit (HOSPITAL_COMMUNITY): Payer: Self-pay

## 2023-12-18 DIAGNOSIS — H6991 Unspecified Eustachian tube disorder, right ear: Secondary | ICD-10-CM | POA: Diagnosis not present

## 2023-12-18 MED ORDER — IPRATROPIUM BROMIDE 0.03 % NA SOLN
2.0000 | Freq: Two times a day (BID) | NASAL | 0 refills | Status: AC
  Filled 2023-12-18: qty 30, 34d supply, fill #0

## 2023-12-18 NOTE — Progress Notes (Signed)

## 2023-12-19 ENCOUNTER — Other Ambulatory Visit (HOSPITAL_BASED_OUTPATIENT_CLINIC_OR_DEPARTMENT_OTHER): Payer: Self-pay

## 2023-12-19 ENCOUNTER — Encounter (HOSPITAL_COMMUNITY): Payer: Self-pay | Admitting: Psychiatry

## 2023-12-19 ENCOUNTER — Ambulatory Visit (INDEPENDENT_AMBULATORY_CARE_PROVIDER_SITE_OTHER): Admitting: Psychiatry

## 2023-12-19 DIAGNOSIS — F411 Generalized anxiety disorder: Secondary | ICD-10-CM | POA: Diagnosis not present

## 2023-12-19 DIAGNOSIS — F333 Major depressive disorder, recurrent, severe with psychotic symptoms: Secondary | ICD-10-CM | POA: Diagnosis not present

## 2023-12-19 DIAGNOSIS — F4310 Post-traumatic stress disorder, unspecified: Secondary | ICD-10-CM

## 2023-12-19 MED ORDER — QUETIAPINE FUMARATE 25 MG PO TABS
25.0000 mg | ORAL_TABLET | Freq: Every day | ORAL | 3 refills | Status: DC
  Filled 2023-12-19 – 2023-12-25 (×3): qty 30, 30d supply, fill #0
  Filled 2024-01-17: qty 30, 30d supply, fill #1

## 2023-12-19 NOTE — Telephone Encounter (Signed)
 Will forward to provider. There is not anything in the notes that discuss urinary incontinence
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
## 2023-12-21 NOTE — Telephone Encounter (Signed)
 Will forward to provider

## 2023-12-21 NOTE — Telephone Encounter (Signed)
 [facility] called to follow up on the supplies that the pt is requesting. I read the note stating that nothing has been discussed about this in her medical records, please advise.

## 2023-12-25 ENCOUNTER — Other Ambulatory Visit (HOSPITAL_COMMUNITY): Payer: Self-pay

## 2023-12-25 ENCOUNTER — Telehealth (HOSPITAL_COMMUNITY): Payer: Self-pay

## 2023-12-25 ENCOUNTER — Other Ambulatory Visit: Payer: Self-pay

## 2023-12-25 NOTE — Telephone Encounter (Signed)
 This prior shara has already been approved.

## 2023-12-25 NOTE — Telephone Encounter (Signed)
 PA request has been Received. New Encounter has been or will be created for follow up. For additional info see Pharmacy Prior Auth telephone encounter from 12/25/23.

## 2023-12-25 NOTE — Telephone Encounter (Signed)
 Pharmacy Patient Advocate Encounter  Received notification from [facility] MEDICAID that Prior Authorization for  QUEtiapine Fumarate 25MG  tablets  has been APPROVED from 12/25/23 to 12/24/24. Ran test claim, Copay is $4. This test claim was processed through [facility]- copay amounts may vary at other pharmacies due to pharmacy/plan contracts, or as the patient moves through the different stages of their insurance plan.   PA #/Case ID/Reference #: [id number]

## 2023-12-25 NOTE — Telephone Encounter (Signed)
 Pharmacy Patient Advocate Encounter   Received notification from Pt Calls Messages that prior authorization for QUEtiapine Fumarate 25MG  tablets  is required/requested.   Insurance verification completed.   The patient is insured through [facility] MEDICAID.   Per test claim: PA required; PA submitted to above mentioned insurance via Latent Key/confirmation #/EOC [id number] Status is pending

## 2023-12-26 ENCOUNTER — Telehealth: Payer: Self-pay

## 2023-12-26 NOTE — Telephone Encounter (Signed)
 Copied from CRM #[id number]. Topic: General - Other >> Dec 25, 2023  5:21 PM Shanda MATSU wrote: Reason for CRM: [facility] w/[facility] CB [id number] Fax: [phone], called in to confirm if req for clinical notes from the past 6 months has been recvd, confirmed that req was recvd on 12/18/2023 & 12/24/2023, caller req that message be sent to provider and or nurse adv that these are needed asap so that patient can get incontinence supplies.

## 2023-12-28 NOTE — Telephone Encounter (Signed)
 Pt has an appt schedule for 01/09/24

## 2024-01-02 ENCOUNTER — Other Ambulatory Visit (INDEPENDENT_AMBULATORY_CARE_PROVIDER_SITE_OTHER): Payer: Self-pay | Admitting: Primary Care

## 2024-01-02 DIAGNOSIS — E782 Mixed hyperlipidemia: Secondary | ICD-10-CM

## 2024-01-02 DIAGNOSIS — J449 Chronic obstructive pulmonary disease, unspecified: Secondary | ICD-10-CM

## 2024-01-02 DIAGNOSIS — R0602 Shortness of breath: Secondary | ICD-10-CM

## 2024-01-02 DIAGNOSIS — R059 Cough, unspecified: Secondary | ICD-10-CM

## 2024-01-02 DIAGNOSIS — I10 Essential (primary) hypertension: Secondary | ICD-10-CM

## 2024-01-07 ENCOUNTER — Other Ambulatory Visit (HOSPITAL_COMMUNITY): Payer: Self-pay

## 2024-01-07 ENCOUNTER — Encounter (INDEPENDENT_AMBULATORY_CARE_PROVIDER_SITE_OTHER): Payer: Self-pay | Admitting: Primary Care

## 2024-01-08 ENCOUNTER — Other Ambulatory Visit (HOSPITAL_COMMUNITY): Payer: Self-pay

## 2024-01-08 ENCOUNTER — Other Ambulatory Visit: Payer: Self-pay

## 2024-01-08 MED ORDER — CARVEDILOL 12.5 MG PO TABS
12.5000 mg | ORAL_TABLET | Freq: Two times a day (BID) | ORAL | 1 refills | Status: AC
  Filled 2024-01-08: qty 180, 90d supply, fill #0

## 2024-01-08 MED ORDER — MONTELUKAST SODIUM 10 MG PO TABS
10.0000 mg | ORAL_TABLET | Freq: Every day | ORAL | 1 refills | Status: AC
  Filled 2024-01-08: qty 90, 90d supply, fill #0

## 2024-01-08 MED ORDER — APIXABAN 5 MG PO TABS
5.0000 mg | ORAL_TABLET | Freq: Two times a day (BID) | ORAL | 1 refills | Status: AC
  Filled 2024-01-08: qty 180, 90d supply, fill #0

## 2024-01-08 MED ORDER — AMLODIPINE BESYLATE 10 MG PO TABS
10.0000 mg | ORAL_TABLET | Freq: Every day | ORAL | 1 refills | Status: AC
  Filled 2024-01-08: qty 90, 90d supply, fill #0

## 2024-01-08 MED ORDER — ATORVASTATIN CALCIUM 40 MG PO TABS
40.0000 mg | ORAL_TABLET | Freq: Every day | ORAL | 1 refills | Status: AC
  Filled 2024-01-08: qty 90, 90d supply, fill #0

## 2024-01-08 MED ORDER — PANTOPRAZOLE SODIUM 40 MG PO TBEC
40.0000 mg | DELAYED_RELEASE_TABLET | Freq: Every day | ORAL | 1 refills | Status: AC
  Filled 2024-01-08: qty 90, 90d supply, fill #0

## 2024-01-08 NOTE — Telephone Encounter (Signed)
 Requested Prescriptions  Pending Prescriptions Disp Refills   amLODipine  (NORVASC ) 10 MG tablet 90 tablet 1    Sig: Take 1 tablet (10 mg total) by mouth daily.     Cardiovascular: Calcium  Channel Blockers 2 Passed - 01/08/2024 10:55 AM      Passed - Last BP in normal range    BP Readings from Last 1 Encounters:  09/05/23 95/68         Passed - Last Heart Rate in normal range    Pulse Readings from Last 1 Encounters:  09/05/23 63         Passed - Valid encounter within last 6 months    Recent Outpatient Visits           4 months ago Encounter for weight management   [place] [facility] Celestia Rosaline SQUIBB, NP   4 months ago Breast cancer screening by mammogram   [place] [facility] Celestia Rosaline SQUIBB, NP   6 months ago Essential hypertension   [place] [facility] Celestia Rosaline SQUIBB, NP   9 months ago Essential hypertension   [place] [facility] Celestia Rosaline SQUIBB, NP   12 months ago Encounter for immunization   [place] [facility] Celestia Rosaline SQUIBB, NP               apixaban  (ELIQUIS ) 5 MG TABS tablet 180 tablet 1    Sig: Take 1 tablet (5 mg total) by mouth 2 (two) times daily.     Hematology:  Anticoagulants - apixaban  Failed - 01/08/2024 10:55 AM      Failed - HCT in normal range and within 360 days    Hematocrit  Date Value Ref Range Status  06/13/2023 48.2 (H) 34.0 - 46.6 % Final         Passed - PLT in normal range and within 360 days    Platelets  Date Value Ref Range Status  06/13/2023 413 150 - 450 x10E3/uL Final         Passed - HGB in normal range and within 360 days    Hemoglobin  Date Value Ref Range Status  06/13/2023 15.2 11.1 - 15.9 g/dL Final         Passed - Cr in normal range and within 360 days    Creatinine  Date Value Ref Range Status  09/16/2013 0.99 0.60 - 1.30 mg/dL Final   Creatinine, Ser  Date Value Ref Range Status   06/13/2023 0.76 0.57 - 1.00 mg/dL Final         Passed - AST in normal range and within 360 days    AST  Date Value Ref Range Status  06/13/2023 20 0 - 40 IU/L Final   SGOT(AST)  Date Value Ref Range Status  09/16/2013 21 15 - 37 Unit/L Final         Passed - ALT in normal range and within 360 days    ALT  Date Value Ref Range Status  06/13/2023 23 0 - 32 IU/L Final   SGPT (ALT)  Date Value Ref Range Status  09/16/2013 19 U/L Final    Comment:    14-63 NOTE: New Reference Range 08/26/13          Passed - Valid encounter within last 12 months    Recent Outpatient Visits           4 months ago Encounter for weight management   [place] [facility]  Celestia Rosaline SQUIBB, NP   4 months ago Breast cancer screening by mammogram   [place] [facility] Celestia Rosaline SQUIBB, NP   6 months ago Essential hypertension   [place] [facility] Celestia Rosaline SQUIBB, NP   9 months ago Essential hypertension   [place] [facility] Celestia Rosaline SQUIBB, NP   12 months ago Encounter for immunization   [place] [facility] Celestia Rosaline SQUIBB, NP               atorvastatin  (LIPITOR) 40 MG tablet 90 tablet 1    Sig: Take 1 tablet (40 mg total) by mouth daily.     Cardiovascular:  Antilipid - Statins Failed - 01/08/2024 10:55 AM      Failed - Lipid Panel in normal range within the last 12 months    Cholesterol, Total  Date Value Ref Range Status  06/13/2023 205 (H) 100 - 199 mg/dL Final   LDL Chol Calc (NIH)  Date Value Ref Range Status  06/13/2023 136 (H) 0 - 99 mg/dL Final   HDL  Date Value Ref Range Status  06/13/2023 47 >39 mg/dL Final   Triglycerides  Date Value Ref Range Status  06/13/2023 123 0 - 149 mg/dL Final         Passed - Patient is not pregnant      Passed - Valid encounter within last 12 months    Recent Outpatient Visits           4 months ago Encounter  for weight management   [place] [facility] Celestia Rosaline SQUIBB, NP   4 months ago Breast cancer screening by mammogram   [place] [facility] Celestia Rosaline SQUIBB, NP   6 months ago Essential hypertension   [place] [facility] Celestia Rosaline SQUIBB, NP   9 months ago Essential hypertension   [place] [facility] Celestia Rosaline SQUIBB, NP   12 months ago Encounter for immunization   [place] [facility] Celestia Rosaline SQUIBB, NP               montelukast  (SINGULAIR ) 10 MG tablet 90 tablet 1    Sig: Take 1 tablet (10 mg total) by mouth at bedtime.     Pulmonology:  Leukotriene Inhibitors Passed - 01/08/2024 10:55 AM      Passed - Valid encounter within last 12 months    Recent Outpatient Visits           4 months ago Encounter for weight management   [place] [facility] Celestia Rosaline SQUIBB, NP   4 months ago Breast cancer screening by mammogram   [place] [facility] Celestia Rosaline SQUIBB, NP   6 months ago Essential hypertension   [place] [facility] Celestia Rosaline SQUIBB, NP   9 months ago Essential hypertension   [place] [facility] Celestia Rosaline SQUIBB, NP   12 months ago Encounter for immunization    [facility] Celestia Rosaline SQUIBB, NP               pantoprazole  (PROTONIX ) 40 MG tablet 90 tablet 1    Sig: Take 1 tablet (40 mg total) by mouth daily. Take 30-60 minutes before the first meal of the day.     Gastroenterology: Proton Pump Inhibitors Passed - 01/08/2024 10:55 AM      Passed - Valid encounter within last 12 months  Recent Outpatient Visits           4 months ago Encounter for weight management   [place] [facility] Celestia Rosaline SQUIBB, NP   4 months ago Breast cancer screening by mammogram   [place] [facility] Celestia Rosaline SQUIBB, NP   6 months ago Essential hypertension   [place] [facility] Celestia Rosaline SQUIBB, NP   9 months ago Essential hypertension   [place] [facility] Celestia Rosaline SQUIBB, NP   12 months ago Encounter for immunization   [place] [facility] Celestia Rosaline SQUIBB, NP               carvedilol  (COREG ) 12.5 MG tablet 180 tablet 1    Sig: Take 1 tablet (12.5 mg total) by mouth 2 (two) times daily with a meal.     Cardiovascular: Beta Blockers 3 Passed - 01/08/2024 10:55 AM      Passed - Cr in normal range and within 360 days    Creatinine  Date Value Ref Range Status  09/16/2013 0.99 0.60 - 1.30 mg/dL Final   Creatinine, Ser  Date Value Ref Range Status  06/13/2023 0.76 0.57 - 1.00 mg/dL Final         Passed - AST in normal range and within 360 days    AST  Date Value Ref Range Status  06/13/2023 20 0 - 40 IU/L Final   SGOT(AST)  Date Value Ref Range Status  09/16/2013 21 15 - 37 Unit/L Final         Passed - ALT in normal range and within 360 days    ALT  Date Value Ref Range Status  06/13/2023 23 0 - 32 IU/L Final   SGPT (ALT)  Date Value Ref Range Status  09/16/2013 19 U/L Final    Comment:    14-63 NOTE: New Reference Range 08/26/13          Passed - Last BP in normal range    BP Readings from Last 1 Encounters:  09/05/23 95/68         Passed - Last Heart Rate in normal range    Pulse Readings from Last 1 Encounters:  09/05/23 63         Passed - Valid encounter within last 6 months    Recent Outpatient Visits           4 months ago Encounter for weight management   [place] [facility] Celestia Rosaline SQUIBB, NP   4 months ago Breast cancer screening by mammogram   [place] [facility] Celestia Rosaline SQUIBB, NP   6 months ago Essential hypertension    [facility] Celestia Rosaline SQUIBB, NP   9 months ago Essential  hypertension    [facility] Celestia Rosaline SQUIBB, NP   12 months ago Encounter for immunization   [facility] Celestia Rosaline SQUIBB, NP

## 2024-01-09 ENCOUNTER — Other Ambulatory Visit (HOSPITAL_COMMUNITY): Payer: Self-pay

## 2024-01-09 ENCOUNTER — Ambulatory Visit (INDEPENDENT_AMBULATORY_CARE_PROVIDER_SITE_OTHER): Admitting: Primary Care

## 2024-01-10 ENCOUNTER — Ambulatory Visit

## 2024-01-14 ENCOUNTER — Other Ambulatory Visit: Payer: Self-pay

## 2024-01-15 ENCOUNTER — Encounter (HOSPITAL_COMMUNITY): Payer: Self-pay

## 2024-01-15 ENCOUNTER — Encounter (INDEPENDENT_AMBULATORY_CARE_PROVIDER_SITE_OTHER): Payer: Self-pay

## 2024-01-15 ENCOUNTER — Ambulatory Visit (INDEPENDENT_AMBULATORY_CARE_PROVIDER_SITE_OTHER): Admitting: Primary Care

## 2024-01-17 ENCOUNTER — Other Ambulatory Visit (HOSPITAL_COMMUNITY): Payer: Self-pay

## 2024-01-17 ENCOUNTER — Encounter (HOSPITAL_COMMUNITY): Payer: Self-pay

## 2024-01-17 ENCOUNTER — Ambulatory Visit (INDEPENDENT_AMBULATORY_CARE_PROVIDER_SITE_OTHER): Admitting: Clinical

## 2024-01-17 DIAGNOSIS — F331 Major depressive disorder, recurrent, moderate: Secondary | ICD-10-CM

## 2024-01-17 DIAGNOSIS — F4310 Post-traumatic stress disorder, unspecified: Secondary | ICD-10-CM | POA: Diagnosis not present
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
## 2024-02-01 ENCOUNTER — Other Ambulatory Visit (INDEPENDENT_AMBULATORY_CARE_PROVIDER_SITE_OTHER): Payer: Self-pay | Admitting: Primary Care

## 2024-02-01 DIAGNOSIS — J449 Chronic obstructive pulmonary disease, unspecified: Secondary | ICD-10-CM

## 2024-02-04 ENCOUNTER — Other Ambulatory Visit: Payer: Self-pay | Admitting: Primary Care

## 2024-02-04 DIAGNOSIS — Z1231 Encounter for screening mammogram for malignant neoplasm of breast: Secondary | ICD-10-CM

## 2024-02-04 MED ORDER — ALBUTEROL SULFATE (2.5 MG/3ML) 0.083% IN NEBU
2.5000 mg | INHALATION_SOLUTION | Freq: Four times a day (QID) | RESPIRATORY_TRACT | 1 refills | Status: AC | PRN
  Filled 2024-02-04: qty 180, 15d supply, fill #0

## 2024-02-04 NOTE — Telephone Encounter (Signed)
 Requested medications are due for refill today.  yes  Requested medications are on the active medications list.  yes  Last refill. 11/09/2023 2mL 1 rf  Future visit scheduled.   yes  Notes to clinic.  Labs are expired.    Requested Prescriptions  Pending Prescriptions Disp Refills   semaglutide -weight management (WEGOVY ) 0.25 MG/0.5ML SOAJ SQ injection 2 mL 1    Sig: Inject 0.25 mg into the skin once a week.     Endocrinology:  Diabetes - GLP-1 Receptor Agonists - semaglutide  Failed - 02/04/2024  4:10 PM      Failed - HBA1C in normal range and within 180 days    HbA1c, POC (prediabetic range)  Date Value Ref Range Status  09/08/2019 5.3 (A) 5.7 - 6.4 % Final   HbA1c, POC (controlled diabetic range)  Date Value Ref Range Status  09/08/2019 5.3 0.0 - 7.0 % Final   HbA1c POC (<> result, manual entry)  Date Value Ref Range Status  09/08/2019 5.3 4.0 - 5.6 % Final   Hgb A1c MFr Bld  Date Value Ref Range Status  04/15/2022 5.4 4.8 - 5.6 % Final    Comment:    (NOTE)         Prediabetes: 5.7 - 6.4         Diabetes: >6.4         Glycemic control for adults with diabetes: <7.0          Passed - Cr in normal range and within 360 days    Creatinine  Date Value Ref Range Status  09/16/2013 0.99 0.60 - 1.30 mg/dL Final   Creatinine, Ser  Date Value Ref Range Status  06/13/2023 0.76 0.57 - 1.00 mg/dL Final         Passed - Valid encounter within last 6 months    Recent Outpatient Visits           5 months ago Encounter for weight management   [place] [facility] Celestia Rosaline SQUIBB, NP   5 months ago Breast cancer screening by mammogram   [place] [facility] Celestia Rosaline SQUIBB, NP   7 months ago Essential hypertension   [place] [facility] Celestia Rosaline SQUIBB, NP   9 months ago Essential hypertension   [place] [facility] Celestia Rosaline SQUIBB, NP   1 year ago Encounter for immunization    [place] [facility] Celestia Rosaline SQUIBB, NP              Signed Prescriptions Disp Refills   albuterol  (PROVENTIL ) (2.5 MG/3ML) 0.083% nebulizer solution 180 mL 1    Sig: Take 3 mLs (2.5 mg total) by nebulization every 6 (six) hours as needed for wheezing or shortness of breath.     Pulmonology:  Beta Agonists 2 Passed - 02/04/2024  4:10 PM      Passed - Last BP in normal range    BP Readings from Last 1 Encounters:  09/05/23 95/68         Passed - Last Heart Rate in normal range    Pulse Readings from Last 1 Encounters:  09/05/23 63         Passed - Valid encounter within last 12 months    Recent Outpatient Visits           5 months ago Encounter for weight management   [place] [facility] Celestia Rosaline SQUIBB, NP   5 months ago Breast  cancer screening by mammogram   [place] [facility] Celestia Rosaline SQUIBB, NP   7 months ago Essential hypertension   [place] [facility] Celestia Rosaline SQUIBB, NP   9 months ago Essential hypertension   [place] [facility] Celestia Rosaline SQUIBB, NP   1 year ago Encounter for immunization   [place] [facility] Celestia Rosaline SQUIBB, NP

## 2024-02-04 NOTE — Telephone Encounter (Signed)
 Requested Prescriptions  Pending Prescriptions Disp Refills   albuterol  (PROVENTIL ) (2.5 MG/3ML) 0.083% nebulizer solution 180 mL 1    Sig: Take 3 mLs (2.5 mg total) by nebulization every 6 (six) hours as needed for wheezing or shortness of breath.     Pulmonology:  Beta Agonists 2 Passed - 02/04/2024  4:10 PM      Passed - Last BP in normal range    BP Readings from Last 1 Encounters:  09/05/23 95/68         Passed - Last Heart Rate in normal range    Pulse Readings from Last 1 Encounters:  09/05/23 63         Passed - Valid encounter within last 12 months    Recent Outpatient Visits           5 months ago Encounter for weight management   [place] [facility] Celestia Rosaline SQUIBB, NP   5 months ago Breast cancer screening by mammogram   [place] [facility] Celestia Rosaline SQUIBB, NP   7 months ago Essential hypertension   [place] [facility] Celestia Rosaline SQUIBB, NP   9 months ago Essential hypertension   [place] [facility] Celestia Rosaline SQUIBB, NP   1 year ago Encounter for immunization   [place] [facility] Celestia Rosaline SQUIBB, NP               semaglutide -weight management (WEGOVY ) 0.25 MG/0.5ML SOAJ SQ injection 2 mL 1    Sig: Inject 0.25 mg into the skin once a week.     Endocrinology:  Diabetes - GLP-1 Receptor Agonists - semaglutide  Failed - 02/04/2024  4:10 PM      Failed - HBA1C in normal range and within 180 days    HbA1c, POC (prediabetic range)  Date Value Ref Range Status  09/08/2019 5.3 (A) 5.7 - 6.4 % Final   HbA1c, POC (controlled diabetic range)  Date Value Ref Range Status  09/08/2019 5.3 0.0 - 7.0 % Final   HbA1c POC (<> result, manual entry)  Date Value Ref Range Status  09/08/2019 5.3 4.0 - 5.6 % Final   Hgb A1c MFr Bld  Date Value Ref Range Status  04/15/2022 5.4 4.8 - 5.6 % Final    Comment:    (NOTE)         Prediabetes: 5.7 - 6.4          Diabetes: >6.4         Glycemic control for adults with diabetes: <7.0          Passed - Cr in normal range and within 360 days    Creatinine  Date Value Ref Range Status  09/16/2013 0.99 0.60 - 1.30 mg/dL Final   Creatinine, Ser  Date Value Ref Range Status  06/13/2023 0.76 0.57 - 1.00 mg/dL Final         Passed - Valid encounter within last 6 months    Recent Outpatient Visits           5 months ago Encounter for weight management   [place] [facility] Celestia Rosaline SQUIBB, NP   5 months ago Breast cancer screening by mammogram   [place] [facility] Celestia Rosaline SQUIBB, NP   7 months ago Essential hypertension   [place] [facility] Celestia Rosaline SQUIBB, NP   9 months ago Essential hypertension    [facility] Celestia Rosaline SQUIBB,  NP   1 year ago Encounter for immunization    [facility] Celestia Rosaline SQUIBB, NP

## 2024-02-05 ENCOUNTER — Other Ambulatory Visit (HOSPITAL_COMMUNITY): Payer: Self-pay

## 2024-02-05 NOTE — Telephone Encounter (Signed)
 Will forward to provider

## 2024-02-06 ENCOUNTER — Other Ambulatory Visit: Payer: Self-pay

## 2024-02-08 MED ORDER — WEGOVY 0.25 MG/0.5ML ~~LOC~~ SOAJ
0.2500 mg | SUBCUTANEOUS | 1 refills | Status: AC
  Filled 2024-02-08 – 2024-02-15 (×2): qty 2, 28d supply, fill #0
  Filled 2024-03-06 – 2024-03-10 (×2): qty 2, 28d supply, fill #1

## 2024-02-11 ENCOUNTER — Other Ambulatory Visit (HOSPITAL_COMMUNITY): Payer: Self-pay

## 2024-02-12 ENCOUNTER — Other Ambulatory Visit: Payer: Self-pay

## 2024-02-13 ENCOUNTER — Other Ambulatory Visit (INDEPENDENT_AMBULATORY_CARE_PROVIDER_SITE_OTHER): Payer: Self-pay | Admitting: Primary Care

## 2024-02-13 ENCOUNTER — Other Ambulatory Visit: Payer: Self-pay

## 2024-02-13 ENCOUNTER — Ambulatory Visit

## 2024-02-13 ENCOUNTER — Other Ambulatory Visit (HOSPITAL_COMMUNITY): Payer: Self-pay

## 2024-02-13 DIAGNOSIS — B9689 Other specified bacterial agents as the cause of diseases classified elsewhere: Secondary | ICD-10-CM

## 2024-02-13 DIAGNOSIS — J449 Chronic obstructive pulmonary disease, unspecified: Secondary | ICD-10-CM

## 2024-02-13 DIAGNOSIS — Z1231 Encounter for screening mammogram for malignant neoplasm of breast: Secondary | ICD-10-CM

## 2024-02-13 MED ORDER — ALBUTEROL SULFATE HFA 108 (90 BASE) MCG/ACT IN AERS
1.0000 | INHALATION_SPRAY | Freq: Four times a day (QID) | RESPIRATORY_TRACT | 2 refills | Status: AC | PRN
  Filled 2024-02-13: qty 6.7, 25d supply, fill #0

## 2024-02-15 ENCOUNTER — Other Ambulatory Visit (HOSPITAL_BASED_OUTPATIENT_CLINIC_OR_DEPARTMENT_OTHER): Payer: Self-pay

## 2024-02-15 ENCOUNTER — Other Ambulatory Visit: Payer: Self-pay

## 2024-02-15 ENCOUNTER — Other Ambulatory Visit (HOSPITAL_COMMUNITY): Payer: Self-pay

## 2024-02-18 ENCOUNTER — Other Ambulatory Visit: Payer: Self-pay

## 2024-02-18 ENCOUNTER — Telehealth (HOSPITAL_COMMUNITY): Payer: Self-pay

## 2024-02-18 ENCOUNTER — Other Ambulatory Visit (HOSPITAL_BASED_OUTPATIENT_CLINIC_OR_DEPARTMENT_OTHER): Payer: Self-pay

## 2024-02-18 ENCOUNTER — Telehealth (INDEPENDENT_AMBULATORY_CARE_PROVIDER_SITE_OTHER): Admitting: Psychiatry

## 2024-02-18 ENCOUNTER — Other Ambulatory Visit (HOSPITAL_COMMUNITY): Payer: Self-pay

## 2024-02-18 ENCOUNTER — Encounter (HOSPITAL_COMMUNITY): Payer: Self-pay | Admitting: Psychiatry

## 2024-02-18 DIAGNOSIS — F333 Major depressive disorder, recurrent, severe with psychotic symptoms: Secondary | ICD-10-CM | POA: Diagnosis not present

## 2024-02-18 DIAGNOSIS — F411 Generalized anxiety disorder: Secondary | ICD-10-CM

## 2024-02-18 MED ORDER — QUETIAPINE FUMARATE 25 MG PO TABS
25.0000 mg | ORAL_TABLET | Freq: Every day | ORAL | 3 refills | Status: AC
  Filled 2024-02-18: qty 30, 30d supply, fill #0

## 2024-02-18 MED ORDER — VORTIOXETINE HBR 5 MG PO TABS
5.0000 mg | ORAL_TABLET | Freq: Every day | ORAL | 3 refills | Status: AC
  Filled 2024-02-18 – 2024-03-06 (×3): qty 30, 30d supply, fill #0
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
## 2024-02-19 ENCOUNTER — Telehealth (INDEPENDENT_AMBULATORY_CARE_PROVIDER_SITE_OTHER): Payer: Self-pay | Admitting: Primary Care

## 2024-02-19 ENCOUNTER — Other Ambulatory Visit (HOSPITAL_COMMUNITY): Payer: Self-pay

## 2024-02-19 NOTE — Telephone Encounter (Signed)
 Spoke to pt about upcoming appt.. Will be present

## 2024-02-19 NOTE — Telephone Encounter (Signed)
 This is not my patient.

## 2024-02-20 ENCOUNTER — Telehealth: Payer: Self-pay

## 2024-02-20 ENCOUNTER — Ambulatory Visit (INDEPENDENT_AMBULATORY_CARE_PROVIDER_SITE_OTHER): Admitting: Primary Care

## 2024-02-20 ENCOUNTER — Other Ambulatory Visit: Payer: Self-pay

## 2024-02-20 NOTE — Telephone Encounter (Signed)
 According to insurance, PA not needed as an approval through 05/29/2024 is already on file. Patient is refilling too soon at the pharmacy, next refill allowed on 03/09/2024.

## 2024-02-20 NOTE — Telephone Encounter (Signed)
 Pharmacy Patient Advocate Encounter   Received notification from CoverMyMeds that prior authorization for WEGOVY  is required/requested.   Insurance verification completed.   The patient is insured through [facility] MEDICAID.   Per test claim: PA required; PA submitted to above mentioned insurance via CoverMyMeds Key/confirmation #/EOC [id number] Status is pending

## 2024-02-21 ENCOUNTER — Other Ambulatory Visit (HOSPITAL_COMMUNITY): Payer: Self-pay

## 2024-02-21 ENCOUNTER — Ambulatory Visit (HOSPITAL_COMMUNITY): Admitting: Clinical

## 2024-02-21 ENCOUNTER — Telehealth (HOSPITAL_COMMUNITY): Payer: Self-pay

## 2024-02-21 DIAGNOSIS — F411 Generalized anxiety disorder: Secondary | ICD-10-CM | POA: Diagnosis not present

## 2024-02-21 NOTE — Progress Notes (Signed)
 "  THERAPIST PROGRESS NOTE Virtual Visit via Telephone Note  I connected with [name] on 02/21/24 at  2:00 PM EST by telephone and verified that I am speaking with the correct person using two identifiers.  Location: Patient: home Provider: remote home office   I discussed the limitations, risks, security and privacy concerns of performing an evaluation and management service by telephone and the availability of in person appointments. I also discussed with the patient that there may be a patient responsible charge related to this service. The patient expressed understanding and agreed to proceed.   Follow Up Instructions: I discussed the assessment and treatment plan with the patient. The patient was provided an opportunity to ask questions and all were answered. The patient agreed with the plan and demonstrated an understanding of the instructions.   The patient was advised to call back or seek an in-person evaluation if the symptoms worsen or if the condition fails to improve as anticipated.   Session Time: 20 min  Participation Level: Active  Behavioral Response: NAAlertEuthymic  Type of Therapy: Individual Therapy  Treatment Goals addressed: client will engage in at least 80% of scheduled individual psychotherapy sessions  ProgressTowards Goals: Progressing  Interventions: CBT  Summary:  Gerardine Bayla Mcgovern is a 54 y.o. female who presents for the scheduled appointment oriented times five, appropriately dressed and friendly. Client denied hallucinations and delusions. Client reported having audio issues, the remainder of appointment was help via telephone call. Client reported she is doing fairly well. Client reported she had a good holiday with her family. Client reported it felt good to go out and see them. Client reported she has been trying to get out more and do the things she use to do. Client reported otherwise she still has triggers. Client  reported she jumps at the slightest thing. Client reported her boyfriend notices and questions why she is so sensitive when she is in a safe space. Client reported she wants to continue working on improving her thoughts pattern. Client reported her thoughts race and it makes her feel miserable. Evidence of progress towards goal:  client reported she wants to work on 1 goal of grief when her mother passed when she was 50 years old.  Suicidal/Homicidal: Nowithout intent/plan  Therapist Response:  Therapist began the appointment asking the client how she has been doing. Therapist engaged with active listening and positive emotional support. Therapist used cbt to engage and ask her about medication compliance and effectiveness. Therapist used cbt to engage and ask her about challenging cognitions and/or behaviors she has tried to work on and/or need help with. Therapist used cbt to teach the client about grounding skills and reframing. Therapist used CBT ask the client to identify her progress with frequency of use with coping skills with continued practice in her daily activity.      Plan: Return again in 3 weeks.  Diagnosis: GAD  Collaboration of Care: Patient refused AEB none requested by the client.  Patient/Guardian was advised Release of Information must be obtained prior to any record release in order to collaborate their care with an outside provider. Patient/Guardian was advised if they have not already done so to contact the registration department to sign all necessary forms in order for us  to release information regarding their care.   Consent: Patient/Guardian gives verbal consent for treatment and assignment of benefits for services provided during this visit. Patient/Guardian expressed understanding and agreed to proceed.   Bentlee Benningfield Y Allyna Pittsley, LCSW 02/21/2024  "

## 2024-02-21 NOTE — Telephone Encounter (Signed)
 Where is this request coming from? [facility]  Medication: Trintellix  5 mg Prior authorization required? Yes If YES, on primary or secondary insurance? Primary Comments:

## 2024-02-22 ENCOUNTER — Other Ambulatory Visit (HOSPITAL_COMMUNITY): Payer: Self-pay

## 2024-02-25 ENCOUNTER — Other Ambulatory Visit (HOSPITAL_COMMUNITY): Payer: Self-pay

## 2024-03-03 ENCOUNTER — Other Ambulatory Visit (HOSPITAL_COMMUNITY): Payer: Self-pay

## 2024-03-04 ENCOUNTER — Inpatient Hospital Stay: Admission: RE | Admit: 2024-03-04 | Source: Ambulatory Visit

## 2024-03-05 ENCOUNTER — Ambulatory Visit (INDEPENDENT_AMBULATORY_CARE_PROVIDER_SITE_OTHER): Admitting: Primary Care

## 2024-03-06 ENCOUNTER — Other Ambulatory Visit: Payer: Self-pay

## 2024-03-10 ENCOUNTER — Other Ambulatory Visit: Payer: Self-pay

## 2024-03-11 ENCOUNTER — Ambulatory Visit (HOSPITAL_COMMUNITY): Admitting: Clinical

## 2024-03-12 ENCOUNTER — Other Ambulatory Visit: Payer: Self-pay | Admitting: Primary Care

## 2024-03-12 DIAGNOSIS — Z1231 Encounter for screening mammogram for malignant neoplasm of breast: Secondary | ICD-10-CM

## 2024-03-13 ENCOUNTER — Encounter

## 2024-03-13 DIAGNOSIS — Z1231 Encounter for screening mammogram for malignant neoplasm of breast: Secondary | ICD-10-CM

## 2024-03-27 ENCOUNTER — Ambulatory Visit (INDEPENDENT_AMBULATORY_CARE_PROVIDER_SITE_OTHER): Admitting: Primary Care

## 2024-04-03 ENCOUNTER — Ambulatory Visit: Admitting: Sports Medicine

## 2024-04-24 ENCOUNTER — Telehealth (HOSPITAL_COMMUNITY): Admitting: Psychiatry
